# Patient Record
Sex: Male | Born: 1955 | Race: White | Hispanic: No | Marital: Married | Smoking: Former smoker
Health system: Southern US, Community
[De-identification: ages and names within clinical notes are randomized; demographics above are authoritative.]

## PROBLEM LIST (undated history)

## (undated) DIAGNOSIS — K8020 Calculus of gallbladder without cholecystitis without obstruction: Secondary | ICD-10-CM

## (undated) DIAGNOSIS — J45909 Unspecified asthma, uncomplicated: Secondary | ICD-10-CM

## (undated) DIAGNOSIS — K219 Gastro-esophageal reflux disease without esophagitis: Secondary | ICD-10-CM

## (undated) DIAGNOSIS — T8609 Other complications of bone marrow transplant: Secondary | ICD-10-CM

## (undated) DIAGNOSIS — D126 Benign neoplasm of colon, unspecified: Secondary | ICD-10-CM

## (undated) DIAGNOSIS — J449 Chronic obstructive pulmonary disease, unspecified: Secondary | ICD-10-CM

## (undated) DIAGNOSIS — Z86711 Personal history of pulmonary embolism: Secondary | ICD-10-CM

## (undated) DIAGNOSIS — K5790 Diverticulosis of intestine, part unspecified, without perforation or abscess without bleeding: Secondary | ICD-10-CM

## (undated) DIAGNOSIS — E119 Type 2 diabetes mellitus without complications: Secondary | ICD-10-CM

## (undated) DIAGNOSIS — F419 Anxiety disorder, unspecified: Secondary | ICD-10-CM

## (undated) DIAGNOSIS — F329 Major depressive disorder, single episode, unspecified: Secondary | ICD-10-CM

## (undated) DIAGNOSIS — D89813 Graft-versus-host disease, unspecified: Secondary | ICD-10-CM

## (undated) DIAGNOSIS — K5900 Constipation, unspecified: Secondary | ICD-10-CM

## (undated) DIAGNOSIS — Z9481 Bone marrow transplant status: Secondary | ICD-10-CM

## (undated) DIAGNOSIS — D649 Anemia, unspecified: Secondary | ICD-10-CM

## (undated) DIAGNOSIS — C9150 Adult T-cell lymphoma/leukemia (HTLV-1-associated) not having achieved remission: Secondary | ICD-10-CM

## (undated) DIAGNOSIS — F32A Depression, unspecified: Secondary | ICD-10-CM

## (undated) DIAGNOSIS — M1990 Unspecified osteoarthritis, unspecified site: Secondary | ICD-10-CM

## (undated) HISTORY — DX: Anxiety disorder, unspecified: F41.9

## (undated) HISTORY — DX: Anemia, unspecified: D64.9

## (undated) HISTORY — DX: Other complications of bone marrow transplant: T86.09

## (undated) HISTORY — DX: Major depressive disorder, single episode, unspecified: F32.9

## (undated) HISTORY — PX: CHOLECYSTECTOMY: SHX55

## (undated) HISTORY — PX: EXPLORATORY LAPAROTOMY: SUR591

## (undated) HISTORY — DX: Benign neoplasm of colon, unspecified: D12.6

## (undated) HISTORY — DX: Gastro-esophageal reflux disease without esophagitis: K21.9

## (undated) HISTORY — DX: Unspecified osteoarthritis, unspecified site: M19.90

## (undated) HISTORY — DX: Graft-versus-host disease, unspecified: D89.813

## (undated) HISTORY — DX: Calculus of gallbladder without cholecystitis without obstruction: K80.20

## (undated) HISTORY — PX: LUNG SURGERY: SHX703

## (undated) HISTORY — DX: Depression, unspecified: F32.A

## (undated) HISTORY — PX: COLONOSCOPY W/ BIOPSIES: SHX1374

## (undated) HISTORY — PX: HERNIA REPAIR: SHX51

## (undated) HISTORY — DX: Diverticulosis of intestine, part unspecified, without perforation or abscess without bleeding: K57.90

---

## 1999-04-13 ENCOUNTER — Encounter: Admission: RE | Admit: 1999-04-13 | Discharge: 1999-04-13 | Payer: Self-pay | Admitting: *Deleted

## 1999-04-13 ENCOUNTER — Encounter: Payer: Self-pay | Admitting: *Deleted

## 1999-10-25 ENCOUNTER — Ambulatory Visit (HOSPITAL_COMMUNITY): Admission: RE | Admit: 1999-10-25 | Discharge: 1999-10-25 | Payer: Self-pay | Admitting: Internal Medicine

## 1999-10-25 ENCOUNTER — Encounter: Payer: Self-pay | Admitting: Internal Medicine

## 2001-09-28 ENCOUNTER — Encounter: Admission: RE | Admit: 2001-09-28 | Discharge: 2001-09-28 | Payer: Self-pay | Admitting: Internal Medicine

## 2001-09-28 ENCOUNTER — Encounter: Payer: Self-pay | Admitting: Internal Medicine

## 2002-04-16 ENCOUNTER — Encounter: Admission: RE | Admit: 2002-04-16 | Discharge: 2002-04-16 | Payer: Self-pay | Admitting: Internal Medicine

## 2002-04-16 ENCOUNTER — Encounter: Payer: Self-pay | Admitting: Internal Medicine

## 2002-10-12 ENCOUNTER — Emergency Department (HOSPITAL_COMMUNITY): Admission: EM | Admit: 2002-10-12 | Discharge: 2002-10-12 | Payer: Self-pay | Admitting: Emergency Medicine

## 2002-10-12 ENCOUNTER — Encounter: Payer: Self-pay | Admitting: Emergency Medicine

## 2002-12-30 ENCOUNTER — Encounter: Admission: RE | Admit: 2002-12-30 | Discharge: 2002-12-30 | Payer: Self-pay | Admitting: Internal Medicine

## 2002-12-30 ENCOUNTER — Encounter: Payer: Self-pay | Admitting: Internal Medicine

## 2003-01-03 ENCOUNTER — Ambulatory Visit (HOSPITAL_COMMUNITY): Admission: RE | Admit: 2003-01-03 | Discharge: 2003-01-03 | Payer: Self-pay | Admitting: Dermatology

## 2004-02-02 ENCOUNTER — Ambulatory Visit (HOSPITAL_COMMUNITY): Admission: RE | Admit: 2004-02-02 | Discharge: 2004-02-02 | Payer: Self-pay | Admitting: Physician Assistant

## 2004-07-23 ENCOUNTER — Emergency Department (HOSPITAL_COMMUNITY): Admission: EM | Admit: 2004-07-23 | Discharge: 2004-07-23 | Payer: Self-pay | Admitting: Emergency Medicine

## 2004-07-25 ENCOUNTER — Emergency Department (HOSPITAL_COMMUNITY): Admission: EM | Admit: 2004-07-25 | Discharge: 2004-07-25 | Payer: Self-pay | Admitting: Family Medicine

## 2004-10-05 ENCOUNTER — Ambulatory Visit (HOSPITAL_COMMUNITY): Admission: RE | Admit: 2004-10-05 | Discharge: 2004-10-05 | Payer: Self-pay | Admitting: Urology

## 2005-06-27 ENCOUNTER — Emergency Department (HOSPITAL_COMMUNITY): Admission: EM | Admit: 2005-06-27 | Discharge: 2005-06-28 | Payer: Self-pay | Admitting: Emergency Medicine

## 2005-06-28 ENCOUNTER — Inpatient Hospital Stay (HOSPITAL_COMMUNITY): Admission: EM | Admit: 2005-06-28 | Discharge: 2005-07-02 | Payer: Self-pay | Admitting: Emergency Medicine

## 2005-11-15 ENCOUNTER — Emergency Department (HOSPITAL_COMMUNITY): Admission: EM | Admit: 2005-11-15 | Discharge: 2005-11-15 | Payer: Self-pay | Admitting: Emergency Medicine

## 2006-07-01 ENCOUNTER — Emergency Department (HOSPITAL_COMMUNITY): Admission: EM | Admit: 2006-07-01 | Discharge: 2006-07-01 | Payer: Self-pay | Admitting: Emergency Medicine

## 2006-12-28 ENCOUNTER — Emergency Department (HOSPITAL_COMMUNITY): Admission: EM | Admit: 2006-12-28 | Discharge: 2006-12-28 | Payer: Self-pay | Admitting: Emergency Medicine

## 2007-03-03 ENCOUNTER — Inpatient Hospital Stay (HOSPITAL_COMMUNITY): Admission: RE | Admit: 2007-03-03 | Discharge: 2007-03-04 | Payer: Self-pay | Admitting: Surgery

## 2008-01-10 ENCOUNTER — Ambulatory Visit: Payer: Self-pay | Admitting: Internal Medicine

## 2008-01-22 ENCOUNTER — Ambulatory Visit: Payer: Self-pay | Admitting: Internal Medicine

## 2008-01-22 ENCOUNTER — Encounter: Payer: Self-pay | Admitting: Internal Medicine

## 2008-01-28 ENCOUNTER — Encounter: Payer: Self-pay | Admitting: Internal Medicine

## 2008-03-12 ENCOUNTER — Encounter: Admission: RE | Admit: 2008-03-12 | Discharge: 2008-05-07 | Payer: Self-pay | Admitting: Orthopedic Surgery

## 2009-03-07 HISTORY — PX: BONE MARROW TRANSPLANT: SHX200

## 2009-04-20 ENCOUNTER — Ambulatory Visit (HOSPITAL_COMMUNITY): Admission: RE | Admit: 2009-04-20 | Discharge: 2009-04-20 | Payer: Self-pay | Admitting: Radiation Oncology

## 2009-07-10 ENCOUNTER — Ambulatory Visit (HOSPITAL_COMMUNITY): Payer: Self-pay | Admitting: Family Medicine

## 2009-07-10 ENCOUNTER — Encounter (HOSPITAL_COMMUNITY): Admission: RE | Admit: 2009-07-10 | Discharge: 2009-08-09 | Payer: Self-pay | Admitting: Physician Assistant

## 2009-07-13 ENCOUNTER — Ambulatory Visit (HOSPITAL_COMMUNITY): Payer: Self-pay | Admitting: Family Medicine

## 2009-07-13 ENCOUNTER — Ambulatory Visit: Payer: Self-pay | Admitting: Oncology

## 2009-07-16 ENCOUNTER — Inpatient Hospital Stay (HOSPITAL_COMMUNITY): Admission: EM | Admit: 2009-07-16 | Discharge: 2009-07-20 | Payer: Self-pay | Admitting: Emergency Medicine

## 2009-07-17 ENCOUNTER — Encounter (INDEPENDENT_AMBULATORY_CARE_PROVIDER_SITE_OTHER): Payer: Self-pay | Admitting: Internal Medicine

## 2010-05-24 LAB — CBC
HCT: 23.4 % — ABNORMAL LOW (ref 39.0–52.0)
Hemoglobin: 8.1 g/dL — ABNORMAL LOW (ref 13.0–17.0)
MCHC: 34.2 g/dL (ref 30.0–36.0)
MCHC: 34.6 g/dL (ref 30.0–36.0)
MCV: 96.4 fL (ref 78.0–100.0)
Platelets: 19 10*3/uL — CL (ref 150–400)
RBC: 2.41 MIL/uL — ABNORMAL LOW (ref 4.22–5.81)
RBC: 2.54 MIL/uL — ABNORMAL LOW (ref 4.22–5.81)
RDW: 20 % — ABNORMAL HIGH (ref 11.5–15.5)
WBC: 3.6 10*3/uL — ABNORMAL LOW (ref 4.0–10.5)
WBC: 4.2 10*3/uL (ref 4.0–10.5)

## 2010-05-24 LAB — DIFFERENTIAL
Basophils Absolute: 0 10*3/uL (ref 0.0–0.1)
Eosinophils Absolute: 0 10*3/uL (ref 0.0–0.7)
Eosinophils Relative: 1 % (ref 0–5)
Eosinophils Relative: 2 % (ref 0–5)
Lymphs Abs: 2 10*3/uL (ref 0.7–4.0)
Lymphs Abs: 2.3 10*3/uL (ref 0.7–4.0)
Monocytes Absolute: 0.1 10*3/uL (ref 0.1–1.0)
Monocytes Relative: 4 % (ref 3–12)
Neutrophils Relative %: 38 % — ABNORMAL LOW (ref 43–77)

## 2010-05-25 LAB — URINALYSIS, ROUTINE W REFLEX MICROSCOPIC
Bilirubin Urine: NEGATIVE
Glucose, UA: NEGATIVE mg/dL
Glucose, UA: NEGATIVE mg/dL
Ketones, ur: NEGATIVE mg/dL
Nitrite: NEGATIVE
Protein, ur: 30 mg/dL — AB
Specific Gravity, Urine: 1.018 (ref 1.005–1.030)

## 2010-05-25 LAB — DIFFERENTIAL
Basophils Relative: 1 % (ref 0–1)
Basophils Relative: 1 % (ref 0–1)
Eosinophils Relative: 0 % (ref 0–5)
Eosinophils Relative: 1 % (ref 0–5)
Lymphocytes Relative: 51 % — ABNORMAL HIGH (ref 12–46)
Lymphs Abs: 2.1 10*3/uL (ref 0.7–4.0)
Neutrophils Relative %: 42 % — ABNORMAL LOW (ref 43–77)
nRBC: 3 /100 WBC — ABNORMAL HIGH

## 2010-05-25 LAB — COMPREHENSIVE METABOLIC PANEL
ALT: 22 U/L (ref 0–53)
AST: 22 U/L (ref 0–37)
Albumin: 2.8 g/dL — ABNORMAL LOW (ref 3.5–5.2)
Albumin: 3.7 g/dL (ref 3.5–5.2)
Alkaline Phosphatase: 63 U/L (ref 39–117)
BUN: 11 mg/dL (ref 6–23)
BUN: 14 mg/dL (ref 6–23)
CO2: 29 mEq/L (ref 19–32)
CO2: 30 mEq/L (ref 19–32)
Calcium: 9 mg/dL (ref 8.4–10.5)
Chloride: 102 mEq/L (ref 96–112)
Chloride: 105 mEq/L (ref 96–112)
Creatinine, Ser: 0.74 mg/dL (ref 0.4–1.5)
Creatinine, Ser: 0.94 mg/dL (ref 0.4–1.5)
Creatinine, Ser: 1.07 mg/dL (ref 0.4–1.5)
GFR calc Af Amer: >60 mL/min (ref >60)
GFR calc non Af Amer: >60 mL/min (ref >60)
Glucose, Bld: 154 mg/dL — ABNORMAL HIGH (ref 70–99)
Glucose, Bld: 98 mg/dL (ref 70–99)
Potassium: 4 mEq/L (ref 3.5–5.1)
Sodium: 141 mEq/L (ref 135–145)
Total Bilirubin: 1 mg/dL (ref 0.3–1.2)
Total Protein: 5.6 g/dL — ABNORMAL LOW (ref 6.0–8.3)
Total Protein: 6 g/dL (ref 6.0–8.3)
Total Protein: 6.5 g/dL (ref 6.0–8.3)

## 2010-05-25 LAB — CROSSMATCH: ABO/RH(D): A POS

## 2010-05-25 LAB — DIC (DISSEMINATED INTRAVASCULAR COAGULATION)PANEL
D-Dimer, Quant: 1.43 ug/mL-FEU — ABNORMAL HIGH (ref 0.00–0.48)
Fibrinogen: 661 mg/dL — ABNORMAL HIGH (ref 204–475)
Fibrinogen: 715 mg/dL — ABNORMAL HIGH (ref 204–475)
INR: 1.15 (ref 0.00–1.49)
INR: 1.24 (ref 0.00–1.49)
Platelets: 20 10*3/uL — CL (ref 150–400)
Platelets: 31 10*3/uL — ABNORMAL LOW (ref 150–400)
Prothrombin Time: 15.5 seconds — ABNORMAL HIGH (ref 11.6–15.2)
Smear Review: NONE SEEN
aPTT: 28 seconds (ref 24–37)

## 2010-05-25 LAB — FERRITIN: Ferritin: 548 ng/mL — ABNORMAL HIGH (ref 22–322)

## 2010-05-25 LAB — CBC
HCT: 25.1 % — ABNORMAL LOW (ref 39.0–52.0)
HCT: 27.7 % — ABNORMAL LOW (ref 39.0–52.0)
HCT: 28.6 % — ABNORMAL LOW (ref 39.0–52.0)
Hemoglobin: 9.6 g/dL — ABNORMAL LOW (ref 13.0–17.0)
Hemoglobin: 9.9 g/dL — ABNORMAL LOW (ref 13.0–17.0)
MCHC: 34.5 g/dL (ref 30.0–36.0)
MCV: 96.9 fL (ref 78.0–100.0)
MCV: 97.2 fL (ref 78.0–100.0)
MCV: 97.5 fL (ref 78.0–100.0)
Platelets: 19 10*3/uL — CL (ref 150–400)
RBC: 2.84 MIL/uL — ABNORMAL LOW (ref 4.22–5.81)
RBC: 2.94 MIL/uL — ABNORMAL LOW (ref 4.22–5.81)
RDW: 20.6 % — ABNORMAL HIGH (ref 11.5–15.5)
RDW: 20.6 % — ABNORMAL HIGH (ref 11.5–15.5)
RDW: 21.1 % — ABNORMAL HIGH (ref 11.5–15.5)
WBC: 3.8 10*3/uL — ABNORMAL LOW (ref 4.0–10.5)

## 2010-05-25 LAB — POCT CARDIAC MARKERS
CKMB, poc: <1 ng/mL — ABNORMAL LOW (ref 1.0–8.0)
Myoglobin, poc: 70.5 ng/mL (ref 12–200)

## 2010-05-25 LAB — APTT: aPTT: 33 seconds (ref 24–37)

## 2010-05-25 LAB — LIPASE, BLOOD: Lipase: 21 U/L (ref 11–59)

## 2010-05-25 LAB — HEPARIN INDUCED THROMBOCYTOPENIA PNL
UFH Low Dose 0.1 IU/mL: 0 % Release
UFH Low Dose 0.5 IU/mL: 0 % Release

## 2010-05-25 LAB — URINE MICROSCOPIC-ADD ON

## 2010-05-25 LAB — ABO/RH
ABO/RH(D): A POS
ABO/RH(D): A POS

## 2010-05-25 LAB — LIPID PANEL
Cholesterol: 146 mg/dL (ref 0–200)
LDL Cholesterol: 86 mg/dL (ref 0–99)
Total CHOL/HDL Ratio: 4.3 RATIO

## 2010-05-25 LAB — CULTURE, BLOOD (ROUTINE X 2)

## 2010-05-25 LAB — HEMOGLOBIN AND HEMATOCRIT, BLOOD
HCT: 23 % — ABNORMAL LOW (ref 39.0–52.0)
Hemoglobin: 8.1 g/dL — ABNORMAL LOW (ref 13.0–17.0)

## 2010-05-25 LAB — BONE MARROW EXAM

## 2010-05-25 LAB — FOLATE: Folate: 14 ng/mL

## 2010-05-25 LAB — CARDIAC PANEL(CRET KIN+CKTOT+MB+TROPI)
CK, MB: 0.5 ng/mL (ref 0.3–4.0)
Relative Index: INVALID (ref 0.0–2.5)
Relative Index: INVALID (ref 0.0–2.5)

## 2010-05-25 LAB — IRON AND TIBC
TIBC: 247 ug/dL (ref 215–435)
UIBC: 205 ug/dL

## 2010-05-25 LAB — TYPE AND SCREEN: ABO/RH(D): A POS

## 2010-05-25 LAB — URINE CULTURE
Colony Count: NO GROWTH
Colony Count: NO GROWTH
Culture: NO GROWTH

## 2010-05-25 LAB — RETICULOCYTES
RBC.: 3.23 MIL/uL — ABNORMAL LOW (ref 4.22–5.81)
Retic Ct Pct: 2.7 % (ref 0.4–3.1)

## 2010-05-25 LAB — CK TOTAL AND CKMB (NOT AT ARMC)
CK, MB: 0.8 ng/mL (ref 0.3–4.0)
Total CK: 25 U/L (ref 7–232)

## 2010-05-25 LAB — CHROMOSOME ANALYSIS, BONE MARROW
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
## 2010-07-23 NOTE — Op Note (Signed)
NAME:  Derek Shepherd, Derek Shepherd                        ACCOUNT NO.:  [id number]   MEDICAL RECORD NO.:  [id number]                   PATIENT TYPE:  AMB   LOCATION:  DAY                                  FACILITY:  [facility]   PHYSICIAN:  Ronald L. Ovidio Hanger, M.D.           DATE OF BIRTH:  03/27/1955   DATE OF PROCEDURE:  01/03/2003  DATE OF DISCHARGE:                                 OPERATIVE REPORT   PREOPERATIVE DIAGNOSIS:  Left ureterolithiasis with significant pain.   POSTOPERATIVE DIAGNOSIS:  Left ureterolithiasis with significant pain.   OPERATION:  Cystourethroscopy, left ureteroscopy with holmium laser  lithotripsy, stone basket extraction and placement of double J stent.   SURGEON:  Lucrezia Starch. Earlene Plater, M.D.   ANESTHESIA:  General endotracheal.   ESTIMATED BLOOD LOSS:  Negligible.   TUBES:  26 cm 6 [place] Cook double pigtail stent.   COMPLICATIONS:  None.   INDICATIONS FOR PROCEDURE:  [name] is a very nice 55 year old white male  who basically presented to our office with a two week history of left flank  pain which radiated to the groin, urgency and frequency. He underwent a CT  scan which revealed a 6 mm left ureteral distal stone with significant  hydronephrosis. He has had persistent pain and after understanding the  risks, benefits, and alternatives elected to proceed with the above  procedure.   DESCRIPTION OF PROCEDURE:  The patient was placed in the supine position,  after proper general endotracheal anesthesia was placed in the dorsal  lithotomy position, prepped and draped with Betadine in a sterile fashion.  Cystourethroscopy was performed with a 22.5 French Olympus panendoscope  utilizing the 12 and 70 degree lenses. The bladder was carefully inspected  and he had mild trilobar hypertrophy. The bladder was smooth walled, efflux  of urine was noted from the right ureteral orifice; however, the left  ureteral orifice appeared to be inflamed and not much urine was  effluxing  from it. Under fluoroscopic guidance, a 0.38 French Teflon coated guidewire  was placed and noted the wound within the left renal pelvis. The  panendoscope was removed and a short thin ACMI ureteroscope were used. The  ureteral orifice was negotiated and the stone which was quite large was  negotiated. Utilizing a 365 micron holmium laser fiber on a setting of 0.5  with a repetition rate of 5, the stone was fragmented into multiple  fragments visually and each fragment was serially grasped with a nitinol  basket and extracted and will be submitted for stone analysis. Reinspection  of the lower third ureter revealed there were no perforations, no further  stones or fragments. The ureteroscope was removed and under fluoroscopic  guidance, a 26 cm, 6 French double pigtail stent was placed and noted to be  in good position within the left renal pelvis within the bladder. A pullout  string was attached, bladder was drained and the string was taped to  the  penis and again confirmation of good stent location was performed  fluoroscopically. The bladder was drained, all scopes removed. The patient  was taken to the recovery room stable.                                               Ronald L. Ovidio Hanger, M.D.    RLD/MEDQ  D:  01/03/2003  T:  01/03/2003  Job:  [id number]

## 2010-07-23 NOTE — Discharge Summary (Signed)
NAMEHAMMAD, FINKLER              ACCOUNT NO.:  [id number]   MEDICAL RECORD NO.:  [id number]          PATIENT TYPE:  INP   LOCATION:  1518                         FACILITY:  [facility]   PHYSICIAN:  Almedia Balls. Ranell Patrick, M.D. DATE OF BIRTH:  1955-05-17   DATE OF ADMISSION:  06/28/2005  DATE OF DISCHARGE:  07/02/2005                                 DISCHARGE SUMMARY   ADMISSION DIAGNOSIS:  Left knee cellulitis.   DISCHARGE DIAGNOSIS:  Left knee cellulitis, improved.   BRIEF HISTORY:  The patient is a 55 year old male who presents to our office  complaining about left knee pain, cellulitis, and increased redness.  The  patient  was urgently admitted for IV antibiotics and lab work.  The patient  denied any fall or injury, just stated pain and swelling just started.   HOSPITAL COURSE:  The patient was admitted from our office with left knee  cellulitis.  The patient had no other previous medical problems. He was  running a temperature of 101.5 upon admission. All other vital signs were  within normal limits.  We did start him on vancomycin IV.  __________ was  with the Jones dressing.  The patient's initial blood work showed a white  count of only 10.5, and that did decrease over his hospital stay.  All other  lab work was  within normal limits.   Hospital days 1 and 2, he stated his knee was still sore, but the erythema  had improved dramatically.  Plans were to send him home on IV vancomycin  once this was approved through [facility].  After he had 48 hours of  IV antibiotics in the hospital, we did perform a PICC line placement, and he  was discharged home with 2 weeks of IV vancomycin planned.  The patient  tolerated hospital stay well and otherwise did well.   DISCHARGE ACTIVITIES:  The patient will be discharged home on July 01, 2005, with a PICC to his right arm.   DISCHARGE MEDICATIONS:  1.  Vancomycin 1 g twice daily for 2 weeks.  2.  Vicodin 5 mg p.o. q. 4-6 h p.r.n.  pain.   CONDITION ON DISCHARGE:  Stable.   DIET:  Regular.   ACTIVITY:  Limited weightbearing on that left leg.   FOLLOW UP:  The patient will follow back up with Dr. Malon Kindle in 2  weeks.      Thomas B. Dixon, P.A.    ______________________________  Almedia Balls. Ranell Patrick, M.D.    TBD/MEDQ  D:  07/15/2005  T:  07/15/2005  Job:  [id number]

## 2010-12-10 LAB — DIFFERENTIAL
Basophils Absolute: 0
Basophils Relative: 0
Eosinophils Absolute: 0.1
Neutrophils Relative %: 67

## 2010-12-10 LAB — CBC
MCHC: 34.4
MCV: 89.3
Platelets: 243

## 2010-12-10 LAB — BASIC METABOLIC PANEL
BUN: 7
CO2: 31
Chloride: 105
Creatinine, Ser: 0.99

## 2010-12-14 DIAGNOSIS — M5137 Other intervertebral disc degeneration, lumbosacral region: Secondary | ICD-10-CM | POA: Insufficient documentation

## 2010-12-15 LAB — CBC
HCT: 42
Hemoglobin: 14.4
MCHC: 34.4
MCV: 89.1
Platelets: 226
RBC: 4.72
RDW: 12.9
WBC: 11.1 — ABNORMAL HIGH

## 2010-12-15 LAB — URINALYSIS, ROUTINE W REFLEX MICROSCOPIC
Protein, ur: NEGATIVE
Urobilinogen, UA: 1

## 2010-12-15 LAB — DIFFERENTIAL
Basophils Absolute: 0.1
Basophils Relative: 1
Eosinophils Absolute: 0.1
Eosinophils Relative: 1
Lymphocytes Relative: 11 — ABNORMAL LOW
Lymphs Abs: 1.2
Monocytes Absolute: 0.7
Monocytes Relative: 6
Neutro Abs: 9 — ABNORMAL HIGH
Neutrophils Relative %: 81 — ABNORMAL HIGH

## 2010-12-15 LAB — BASIC METABOLIC PANEL WITH GFR
BUN: 11
Chloride: 104
GFR calc non Af Amer: >60
Glucose, Bld: 101 — ABNORMAL HIGH
Potassium: 4.6
Sodium: 141

## 2010-12-15 LAB — BASIC METABOLIC PANEL
CO2: 30
Calcium: 9.2
Creatinine, Ser: 0.86
GFR calc Af Amer: >60

## 2011-03-31 ENCOUNTER — Encounter (HOSPITAL_COMMUNITY): Payer: Self-pay

## 2011-03-31 ENCOUNTER — Encounter (HOSPITAL_COMMUNITY)
Admission: RE | Admit: 2011-03-31 | Discharge: 2011-03-31 | Disposition: A | Discharge status: Home / Self Care | Payer: BC Managed Care – PPO | Source: Ambulatory Visit | Attending: Family Medicine | Admitting: Family Medicine

## 2011-03-31 DIAGNOSIS — Z5189 Encounter for other specified aftercare: Secondary | ICD-10-CM | POA: Insufficient documentation

## 2011-03-31 DIAGNOSIS — J4489 Other specified chronic obstructive pulmonary disease: Secondary | ICD-10-CM | POA: Insufficient documentation

## 2011-03-31 DIAGNOSIS — J449 Chronic obstructive pulmonary disease, unspecified: Secondary | ICD-10-CM | POA: Insufficient documentation

## 2011-04-04 ENCOUNTER — Encounter (HOSPITAL_COMMUNITY): Payer: BC Managed Care – PPO

## 2011-04-06 ENCOUNTER — Encounter (HOSPITAL_COMMUNITY)
Admission: RE | Admit: 2011-04-06 | Discharge: 2011-04-06 | Disposition: A | Discharge status: Home / Self Care | Payer: BC Managed Care – PPO | Source: Ambulatory Visit | Attending: *Deleted | Admitting: *Deleted

## 2011-04-11 ENCOUNTER — Encounter (HOSPITAL_COMMUNITY)
Admission: RE | Admit: 2011-04-11 | Discharge: 2011-04-11 | Disposition: A | Discharge status: Home / Self Care | Payer: BC Managed Care – PPO | Source: Ambulatory Visit | Attending: Family Medicine | Admitting: Family Medicine

## 2011-04-11 DIAGNOSIS — Z5189 Encounter for other specified aftercare: Secondary | ICD-10-CM | POA: Insufficient documentation

## 2011-04-11 DIAGNOSIS — J4489 Other specified chronic obstructive pulmonary disease: Secondary | ICD-10-CM | POA: Insufficient documentation

## 2011-04-11 DIAGNOSIS — J449 Chronic obstructive pulmonary disease, unspecified: Secondary | ICD-10-CM | POA: Insufficient documentation

## 2011-04-13 ENCOUNTER — Encounter (HOSPITAL_COMMUNITY)
Admission: RE | Admit: 2011-04-13 | Discharge: 2011-04-13 | Disposition: A | Discharge status: Home / Self Care | Payer: BC Managed Care – PPO | Source: Ambulatory Visit | Attending: *Deleted | Admitting: *Deleted

## 2011-04-18 ENCOUNTER — Encounter (HOSPITAL_COMMUNITY)
Admission: RE | Admit: 2011-04-18 | Discharge: 2011-04-18 | Disposition: A | Discharge status: Home / Self Care | Payer: BC Managed Care – PPO | Source: Ambulatory Visit | Attending: *Deleted | Admitting: *Deleted

## 2011-04-20 ENCOUNTER — Encounter (HOSPITAL_COMMUNITY)
Admission: RE | Admit: 2011-04-20 | Discharge: 2011-04-20 | Disposition: A | Discharge status: Home / Self Care | Payer: BC Managed Care – PPO | Source: Ambulatory Visit | Attending: *Deleted | Admitting: *Deleted

## 2011-04-25 ENCOUNTER — Encounter (HOSPITAL_COMMUNITY)
Admission: RE | Admit: 2011-04-25 | Discharge: 2011-04-25 | Disposition: A | Discharge status: Home / Self Care | Payer: BC Managed Care – PPO | Source: Ambulatory Visit | Attending: *Deleted | Admitting: *Deleted

## 2011-04-27 ENCOUNTER — Encounter (HOSPITAL_COMMUNITY)
Admission: RE | Admit: 2011-04-27 | Discharge: 2011-04-27 | Disposition: A | Discharge status: Home / Self Care | Payer: BC Managed Care – PPO | Source: Ambulatory Visit | Attending: Family Medicine | Admitting: Family Medicine

## 2011-05-02 ENCOUNTER — Encounter (HOSPITAL_COMMUNITY)
Admission: RE | Admit: 2011-05-02 | Discharge: 2011-05-02 | Disposition: A | Discharge status: Home / Self Care | Payer: BC Managed Care – PPO | Source: Ambulatory Visit | Attending: Family Medicine | Admitting: Family Medicine

## 2011-05-04 ENCOUNTER — Encounter (HOSPITAL_COMMUNITY)
Admission: RE | Admit: 2011-05-04 | Discharge: 2011-05-04 | Disposition: A | Discharge status: Home / Self Care | Payer: BC Managed Care – PPO | Source: Ambulatory Visit | Attending: Family Medicine | Admitting: Family Medicine

## 2011-05-09 ENCOUNTER — Encounter (HOSPITAL_COMMUNITY)
Admission: RE | Admit: 2011-05-09 | Discharge: 2011-05-09 | Disposition: A | Discharge status: Home / Self Care | Payer: BC Managed Care – PPO | Source: Ambulatory Visit | Attending: Family Medicine | Admitting: Family Medicine

## 2011-05-09 DIAGNOSIS — J4489 Other specified chronic obstructive pulmonary disease: Secondary | ICD-10-CM | POA: Insufficient documentation

## 2011-05-09 DIAGNOSIS — Z5189 Encounter for other specified aftercare: Secondary | ICD-10-CM | POA: Insufficient documentation

## 2011-05-09 DIAGNOSIS — J449 Chronic obstructive pulmonary disease, unspecified: Secondary | ICD-10-CM | POA: Insufficient documentation

## 2011-05-11 ENCOUNTER — Encounter (HOSPITAL_COMMUNITY)
Admission: RE | Admit: 2011-05-11 | Discharge: 2011-05-11 | Disposition: A | Discharge status: Home / Self Care | Payer: BC Managed Care – PPO | Source: Ambulatory Visit | Attending: Family Medicine | Admitting: Family Medicine

## 2011-05-16 ENCOUNTER — Encounter (HOSPITAL_COMMUNITY)
Admission: RE | Admit: 2011-05-16 | Discharge: 2011-05-16 | Disposition: A | Discharge status: Home / Self Care | Payer: BC Managed Care – PPO | Source: Ambulatory Visit | Attending: Family Medicine | Admitting: Family Medicine

## 2011-05-18 ENCOUNTER — Encounter (HOSPITAL_COMMUNITY)
Admission: RE | Admit: 2011-05-18 | Discharge: 2011-05-18 | Disposition: A | Discharge status: Home / Self Care | Payer: BC Managed Care – PPO | Source: Ambulatory Visit | Attending: Family Medicine | Admitting: Family Medicine

## 2011-05-23 ENCOUNTER — Encounter (HOSPITAL_COMMUNITY)
Admission: RE | Admit: 2011-05-23 | Discharge: 2011-05-23 | Disposition: A | Discharge status: Home / Self Care | Payer: BC Managed Care – PPO | Source: Ambulatory Visit | Attending: Family Medicine | Admitting: Family Medicine

## 2011-05-25 ENCOUNTER — Encounter (HOSPITAL_COMMUNITY)
Admission: RE | Admit: 2011-05-25 | Discharge: 2011-05-25 | Disposition: A | Discharge status: Home / Self Care | Payer: BC Managed Care – PPO | Source: Ambulatory Visit | Attending: Family Medicine | Admitting: Family Medicine

## 2011-05-30 ENCOUNTER — Encounter (HOSPITAL_COMMUNITY)
Admission: RE | Admit: 2011-05-30 | Discharge: 2011-05-30 | Disposition: A | Discharge status: Home / Self Care | Payer: BC Managed Care – PPO | Source: Ambulatory Visit | Attending: Family Medicine | Admitting: Family Medicine

## 2011-06-01 ENCOUNTER — Encounter (HOSPITAL_COMMUNITY)
Admission: RE | Admit: 2011-06-01 | Discharge: 2011-06-01 | Disposition: A | Discharge status: Home / Self Care | Payer: BC Managed Care – PPO | Source: Ambulatory Visit | Attending: Family Medicine | Admitting: Family Medicine

## 2011-06-06 ENCOUNTER — Encounter (HOSPITAL_COMMUNITY)
Admission: RE | Admit: 2011-06-06 | Discharge: 2011-06-06 | Disposition: A | Discharge status: Home / Self Care | Payer: BC Managed Care – PPO | Source: Ambulatory Visit | Attending: Family Medicine | Admitting: Family Medicine

## 2011-06-06 DIAGNOSIS — J4489 Other specified chronic obstructive pulmonary disease: Secondary | ICD-10-CM | POA: Insufficient documentation

## 2011-06-06 DIAGNOSIS — J449 Chronic obstructive pulmonary disease, unspecified: Secondary | ICD-10-CM | POA: Insufficient documentation

## 2011-06-06 DIAGNOSIS — Z5189 Encounter for other specified aftercare: Secondary | ICD-10-CM | POA: Insufficient documentation

## 2011-06-08 ENCOUNTER — Encounter (HOSPITAL_COMMUNITY)
Admission: RE | Admit: 2011-06-08 | Discharge: 2011-06-08 | Disposition: A | Discharge status: Home / Self Care | Payer: BC Managed Care – PPO | Source: Ambulatory Visit | Attending: Family Medicine | Admitting: Family Medicine

## 2011-06-13 ENCOUNTER — Encounter (HOSPITAL_COMMUNITY)
Admission: RE | Admit: 2011-06-13 | Discharge: 2011-06-13 | Disposition: A | Discharge status: Home / Self Care | Payer: BC Managed Care – PPO | Source: Ambulatory Visit | Attending: Family Medicine | Admitting: Family Medicine

## 2011-06-15 ENCOUNTER — Encounter (HOSPITAL_COMMUNITY)
Admission: RE | Admit: 2011-06-15 | Discharge: 2011-06-15 | Disposition: A | Discharge status: Home / Self Care | Payer: BC Managed Care – PPO | Source: Ambulatory Visit | Attending: Family Medicine | Admitting: Family Medicine

## 2011-06-20 ENCOUNTER — Encounter (HOSPITAL_COMMUNITY)
Admission: RE | Admit: 2011-06-20 | Discharge: 2011-06-20 | Disposition: A | Discharge status: Home / Self Care | Payer: BC Managed Care – PPO | Source: Ambulatory Visit | Attending: Family Medicine | Admitting: Family Medicine

## 2011-06-22 ENCOUNTER — Encounter (HOSPITAL_COMMUNITY)
Admission: RE | Admit: 2011-06-22 | Discharge: 2011-06-22 | Disposition: A | Discharge status: Home / Self Care | Payer: BC Managed Care – PPO | Source: Ambulatory Visit | Attending: Family Medicine | Admitting: Family Medicine

## 2011-06-27 ENCOUNTER — Encounter (HOSPITAL_COMMUNITY)
Admission: RE | Admit: 2011-06-27 | Discharge: 2011-06-27 | Disposition: A | Discharge status: Home / Self Care | Payer: BC Managed Care – PPO | Source: Ambulatory Visit | Attending: Family Medicine | Admitting: Family Medicine

## 2011-06-29 ENCOUNTER — Encounter (HOSPITAL_COMMUNITY)
Admission: RE | Admit: 2011-06-29 | Discharge: 2011-06-29 | Disposition: A | Discharge status: Home / Self Care | Payer: BC Managed Care – PPO | Source: Ambulatory Visit | Attending: Family Medicine | Admitting: Family Medicine

## 2011-07-04 ENCOUNTER — Encounter (HOSPITAL_COMMUNITY)
Admission: RE | Admit: 2011-07-04 | Discharge: 2011-07-04 | Disposition: A | Discharge status: Home / Self Care | Payer: BC Managed Care – PPO | Source: Ambulatory Visit | Attending: Family Medicine | Admitting: Family Medicine

## 2011-07-06 ENCOUNTER — Encounter (HOSPITAL_COMMUNITY): Payer: BC Managed Care – PPO

## 2011-07-11 ENCOUNTER — Encounter (HOSPITAL_COMMUNITY): Payer: BC Managed Care – PPO

## 2011-07-13 ENCOUNTER — Encounter (HOSPITAL_COMMUNITY): Payer: BC Managed Care – PPO

## 2011-07-22 NOTE — Progress Notes (Signed)
Pulmonary Rehabilitation Program Outcomes Report   Orientation:  03/31/2011 Graduate Date:  tbd Discharge Date:  tbd # of sessions completed: 12 DX: COPD  Pulmonologist: Gayland Curry Family MD:  Rudi Heap Class Time:  13:00  A.  Exercise Program:  Tolerates exercise @ 2.5 METS for 15 minutes  B.  Mental Health:  Good mental attitude  C.  Education/Instruction/Skills  Knows THR for exercise and Uses Perceived Exertion Scale and/or Dyspnea Scale  Uses Perceived Exertion Scale and/or Dyspnea Scale  D.  Nutrition/Weight Control/Body Composition:  Adherence to prescribed nutrition program: good    E.  Blood Lipids    Lab Results  Component Value Date   CHOL  Value: 146        ATP III CLASSIFICATION:  <200     mg/dL   Desirable  782-956  mg/dL   Borderline High  >=213    mg/dL   High        0/86/5784   HDL 34* 07/17/2009   LDLCALC  Value: 86        Total Cholesterol/HDL:CHD Risk Coronary Heart Disease Risk Table                     Men   Women  1/2 Average Risk   3.4   3.3  Average Risk       5.0   4.4  2 X Average Risk   9.6   7.1  3 X Average Risk  23.4   11.0        Use the calculated Patient Ratio above and the CHD Risk Table to determine the patient's CHD Risk.        ATP III CLASSIFICATION (LDL):  <100     mg/dL   Optimal  696-295  mg/dL   Near or Above                    Optimal  130-159  mg/dL   Borderline  284-132  mg/dL   High  >440     mg/dL   Very High 03/09/7251   TRIG 128 07/17/2009   CHOLHDL 4.3 07/17/2009    F.  Lifestyle Changes:  Making positive lifestyle changes  G.  Symptoms noted with exercise:  Asymptomatic  Report Completed By:  Lelon Huh. Teshia Mahone RN   Comments:  This is patients Half way Report. He achieved a peak Mets of 2.5. His Resting HR is 86 and his resting BP is 130/80. His peak HR is 104 and his peak BP is 132/80. A graduation report will follow.

## 2011-07-22 NOTE — Progress Notes (Signed)
Pulmonary Rehabilitation Program Outcomes Report   Orientation:  03/31/2011 Graduate Date:  tbd Discharge Date:  tbd # of sessions completed: 3 DX: COPD  Pulmonologist: Gayland Curry Family MD:  Rudi Heap Class Time:  13:00  A.  Exercise Program:  Tolerates exercise @ 2.2 METS for 15 minutes  B.  Mental Health:  Good mental attitude  C.  Education/Instruction/Skills  Knows THR for exercise and Uses Perceived Exertion Scale and/or Dyspnea Scale  Uses Perceived Exertion Scale and/or Dyspnea Scale  D.  Nutrition/Weight Control/Body Composition:  Adherence to prescribed nutrition program: good    E.  Blood Lipids    Lab Results  Component Value Date   CHOL  Value: 146        ATP III CLASSIFICATION:  <200     mg/dL   Desirable  409-811  mg/dL   Borderline High  >=914    mg/dL   High        7/82/9562   HDL 34* 07/17/2009   LDLCALC  Value: 86        Total Cholesterol/HDL:CHD Risk Coronary Heart Disease Risk Table                     Men   Women  1/2 Average Risk   3.4   3.3  Average Risk       5.0   4.4  2 X Average Risk   9.6   7.1  3 X Average Risk  23.4   11.0        Use the calculated Patient Ratio above and the CHD Risk Table to determine the patient's CHD Risk.        ATP III CLASSIFICATION (LDL):  <100     mg/dL   Optimal  130-865  mg/dL   Near or Above                    Optimal  130-159  mg/dL   Borderline  784-696  mg/dL   High  >295     mg/dL   Very High 2/84/1324   TRIG 128 07/17/2009   CHOLHDL 4.3 07/17/2009    F.  Lifestyle Changes:  Making positive lifestyle changes  G.  Symptoms noted with exercise:  Asymptomatic  Report Completed By:  Lelon Huh. Shyann Hefner RN   Comments:  This is patients 1 st week report. He achieved a peak Mets of 2.2. His resting HR is 74 and his resting BP is 110/72. His peak HR is 102 and his peak BP is 140/60. A halfway report will follow on his 12 th visit

## 2011-07-22 NOTE — Progress Notes (Signed)
Pulmonary Rehabilitation Program Outcomes Report   Orientation:  03/31/2011 Graduate Date:  04.29/2013 Discharge Date:  07/04/2011 # of sessions completed: 26 DX: COPD  Pulmonologist: Gayland Curry Family MD:  Rudi Heap  Class Time:  13:00  A.  Exercise Program:  Tolerates exercise @ 2.9 METS for 15 minutes  B.  Mental Health:  Good mental attitude  C.  Education/Instruction/Skills  Knows THR for exercise and Uses Perceived Exertion Scale and/or Dyspnea Scale  Attended all education classes  D.  Nutrition/Weight Control/Body Composition:  Adherence to prescribed nutrition program: good    E.  Blood Lipids    Lab Results  Component Value Date   CHOL  Value: 146        ATP III CLASSIFICATION:  <200     mg/dL   Desirable  409-811  mg/dL   Borderline High  >=914    mg/dL   High        7/82/9562   HDL 34* 07/17/2009   LDLCALC  Value: 86        Total Cholesterol/HDL:CHD Risk Coronary Heart Disease Risk Table                     Men   Women  1/2 Average Risk   3.4   3.3  Average Risk       5.0   4.4  2 X Average Risk   9.6   7.1  3 X Average Risk  23.4   11.0        Use the calculated Patient Ratio above and the CHD Risk Table to determine the patient's CHD Risk.        ATP III CLASSIFICATION (LDL):  <100     mg/dL   Optimal  130-865  mg/dL   Near or Above                    Optimal  130-159  mg/dL   Borderline  784-696  mg/dL   High  >295     mg/dL   Very High 2/84/1324   TRIG 128 07/17/2009   CHOLHDL 4.3 07/17/2009    F.  Lifestyle Changes:  Making positive lifestyle changes  G.  Symptoms noted with exercise:  Asymptomatic  Report Completed By:  Lelon Huh. Tyshawn Keel RN   Comments:  Mr Shostak has progressed nicely to 30 min of aerobic exercise @ Max MET level of 2.9 and 10 min strength and flexibility exercises. Patient has met with dietician. All patients Vitals are WNL. DC instructions were given and discussed in detail; Verbalized understanding.  Patient plans to join a local gym for follow up exercise. Cardiac Rehab staff will make F/U calls at 1 month,  6 months and 1 year. Patient had no C/O any abnormal S/S while in rehab.

## 2011-10-31 ENCOUNTER — Emergency Department (HOSPITAL_BASED_OUTPATIENT_CLINIC_OR_DEPARTMENT_OTHER)
Admission: EM | Admit: 2011-10-31 | Discharge: 2011-10-31 | Disposition: A | Discharge status: Home / Self Care | Payer: BC Managed Care – PPO | Attending: Emergency Medicine | Admitting: Emergency Medicine

## 2011-10-31 ENCOUNTER — Encounter (HOSPITAL_BASED_OUTPATIENT_CLINIC_OR_DEPARTMENT_OTHER): Payer: Self-pay | Admitting: Emergency Medicine

## 2011-10-31 DIAGNOSIS — D6832 Hemorrhagic disorder due to extrinsic circulating anticoagulants: Secondary | ICD-10-CM

## 2011-10-31 DIAGNOSIS — Z7901 Long term (current) use of anticoagulants: Secondary | ICD-10-CM | POA: Insufficient documentation

## 2011-10-31 DIAGNOSIS — Z86718 Personal history of other venous thrombosis and embolism: Secondary | ICD-10-CM | POA: Insufficient documentation

## 2011-10-31 DIAGNOSIS — C9100 Acute lymphoblastic leukemia not having achieved remission: Secondary | ICD-10-CM | POA: Insufficient documentation

## 2011-10-31 DIAGNOSIS — R040 Epistaxis: Secondary | ICD-10-CM | POA: Insufficient documentation

## 2011-10-31 DIAGNOSIS — J4489 Other specified chronic obstructive pulmonary disease: Secondary | ICD-10-CM | POA: Insufficient documentation

## 2011-10-31 DIAGNOSIS — J449 Chronic obstructive pulmonary disease, unspecified: Secondary | ICD-10-CM | POA: Insufficient documentation

## 2011-10-31 HISTORY — DX: Chronic obstructive pulmonary disease, unspecified: J44.9

## 2011-10-31 HISTORY — DX: Adult T-cell lymphoma/leukemia (HTLV-1-associated) not having achieved remission: C91.50

## 2011-10-31 LAB — CBC WITH DIFFERENTIAL/PLATELET
Hemoglobin: 10.4 g/dL — ABNORMAL LOW (ref 13.0–17.0)
Lymphocytes Relative: 24 % (ref 12–46)
Lymphs Abs: 1.2 10*3/uL (ref 0.7–4.0)
MCH: 35.9 pg — ABNORMAL HIGH (ref 26.0–34.0)
Monocytes Relative: 8 % (ref 3–12)
Neutro Abs: 2.8 10*3/uL (ref 1.7–7.7)
Neutrophils Relative %: 60 % (ref 43–77)
RBC: 2.9 MIL/uL — ABNORMAL LOW (ref 4.22–5.81)
WBC: 4.7 10*3/uL (ref 4.0–10.5)

## 2011-10-31 LAB — PROTIME-INR: INR: 6.27 (ref 0.00–1.49)

## 2011-10-31 MED ORDER — PHYTONADIONE 5 MG PO TABS
10.0000 mg | ORAL_TABLET | Freq: Once | ORAL | Status: AC
Start: 2011-10-31 — End: 2011-10-31
  Administered 2011-10-31: 10 mg via ORAL

## 2011-10-31 MED ORDER — OXYMETAZOLINE HCL 0.05 % NA SOLN
NASAL | Status: AC
  Administered 2011-10-31: 21:00:00
  Filled 2011-10-31: qty 15

## 2011-10-31 MED ORDER — PHYTONADIONE 5 MG PO TABS
ORAL_TABLET | ORAL | Status: AC
  Administered 2011-10-31: 10 mg via ORAL
  Filled 2011-10-31: qty 2

## 2011-10-31 NOTE — ED Notes (Signed)
Pt is on coumain for DVT s/p cholecystectomy.

## 2011-10-31 NOTE — ED Notes (Signed)
Pt c/o epistaxis from right nare since 2pm today. Pt states bleeding will stop and then he pulls a clot out and bleeding continues.

## 2011-10-31 NOTE — ED Notes (Signed)
No nose bleed at present
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
## 2011-10-31 NOTE — ED Notes (Signed)
EDP Bernette Mayers notified of PT, INR

## 2011-12-29 ENCOUNTER — Encounter (HOSPITAL_COMMUNITY)
Admission: RE | Admit: 2011-12-29 | Discharge: 2011-12-29 | Disposition: A | Discharge status: Home / Self Care | Payer: BC Managed Care – PPO | Source: Ambulatory Visit | Attending: Family Medicine | Admitting: Family Medicine

## 2011-12-29 ENCOUNTER — Encounter (HOSPITAL_COMMUNITY): Payer: Self-pay

## 2011-12-29 VITALS — BP 120/70 | HR 84 | Ht 67.0 in | Wt 237.4 lb

## 2011-12-29 DIAGNOSIS — J4489 Other specified chronic obstructive pulmonary disease: Secondary | ICD-10-CM | POA: Insufficient documentation

## 2011-12-29 DIAGNOSIS — Z5189 Encounter for other specified aftercare: Secondary | ICD-10-CM | POA: Insufficient documentation

## 2011-12-29 DIAGNOSIS — J9610 Chronic respiratory failure, unspecified whether with hypoxia or hypercapnia: Secondary | ICD-10-CM

## 2011-12-29 DIAGNOSIS — J449 Chronic obstructive pulmonary disease, unspecified: Secondary | ICD-10-CM | POA: Insufficient documentation

## 2011-12-29 NOTE — Progress Notes (Addendum)
Dr. Quincy Sheehan referred patient to Pulmonary Rehab due to COPD 496. During orientation advised patient on arrival and appointment times what to wear, what to do before, during and after exercise. Reviewed attendance and class policy. Talked about inclement weather and class consultation policy. Pt is scheduled to start Pulmonary Rehab on 01/02/12 at 1 pm. Pt was advised to come to class 5 minutes before class starts. He was also given instructions on meeting with the dietician and attending the Family Structure classes. Pt is eager to get started.   Patient did a 6 minute walk test on 12/29/11.

## 2011-12-29 NOTE — Patient Instructions (Signed)
Pt has finished orientation and is scheduled to start PR on 01/02/12 at 1 pm. Pt has been instructed to arrive to class 15 minutes early for scheduled class. Pt has been instructed to wear comfortable clothing and shoes with rubber soles. Pt has been told to take their medications 1 hour prior to coming to class.  If the patient is not going to attend class, he/she has been instructed to call.

## 2012-01-02 ENCOUNTER — Encounter (HOSPITAL_COMMUNITY)
Admission: RE | Admit: 2012-01-02 | Discharge: 2012-01-02 | Disposition: A | Discharge status: Home / Self Care | Payer: BC Managed Care – PPO | Source: Ambulatory Visit | Attending: *Deleted | Admitting: *Deleted

## 2012-01-04 ENCOUNTER — Encounter (HOSPITAL_COMMUNITY)
Admission: RE | Admit: 2012-01-04 | Discharge: 2012-01-04 | Disposition: A | Discharge status: Home / Self Care | Payer: BC Managed Care – PPO | Source: Ambulatory Visit | Attending: *Deleted | Admitting: *Deleted

## 2012-01-09 ENCOUNTER — Encounter (HOSPITAL_COMMUNITY): Payer: BC Managed Care – PPO

## 2012-01-11 ENCOUNTER — Encounter (HOSPITAL_COMMUNITY)
Admission: RE | Admit: 2012-01-11 | Discharge: 2012-01-11 | Disposition: A | Discharge status: Home / Self Care | Payer: BC Managed Care – PPO | Source: Ambulatory Visit | Attending: Family Medicine | Admitting: Family Medicine

## 2012-01-11 DIAGNOSIS — J4489 Other specified chronic obstructive pulmonary disease: Secondary | ICD-10-CM | POA: Insufficient documentation

## 2012-01-11 DIAGNOSIS — J449 Chronic obstructive pulmonary disease, unspecified: Secondary | ICD-10-CM | POA: Insufficient documentation

## 2012-01-11 DIAGNOSIS — Z5189 Encounter for other specified aftercare: Secondary | ICD-10-CM | POA: Insufficient documentation

## 2012-01-16 ENCOUNTER — Encounter (HOSPITAL_COMMUNITY)
Admission: RE | Admit: 2012-01-16 | Discharge: 2012-01-16 | Disposition: A | Discharge status: Home / Self Care | Payer: BC Managed Care – PPO | Source: Ambulatory Visit | Attending: *Deleted | Admitting: *Deleted

## 2012-01-18 ENCOUNTER — Encounter (HOSPITAL_COMMUNITY)
Admission: RE | Admit: 2012-01-18 | Discharge: 2012-01-18 | Disposition: A | Discharge status: Home / Self Care | Payer: BC Managed Care – PPO | Source: Ambulatory Visit | Attending: *Deleted | Admitting: *Deleted

## 2012-01-23 ENCOUNTER — Encounter (HOSPITAL_COMMUNITY)
Admission: RE | Admit: 2012-01-23 | Discharge: 2012-01-23 | Disposition: A | Discharge status: Home / Self Care | Payer: BC Managed Care – PPO | Source: Ambulatory Visit | Attending: *Deleted | Admitting: *Deleted

## 2012-01-25 ENCOUNTER — Encounter (HOSPITAL_COMMUNITY)
Admission: RE | Admit: 2012-01-25 | Discharge: 2012-01-25 | Disposition: A | Discharge status: Home / Self Care | Payer: BC Managed Care – PPO | Source: Ambulatory Visit | Attending: *Deleted | Admitting: *Deleted

## 2012-01-30 ENCOUNTER — Encounter (HOSPITAL_COMMUNITY)
Admission: RE | Admit: 2012-01-30 | Discharge: 2012-01-30 | Disposition: A | Discharge status: Home / Self Care | Payer: BC Managed Care – PPO | Source: Ambulatory Visit | Attending: *Deleted | Admitting: *Deleted

## 2012-02-01 ENCOUNTER — Encounter (HOSPITAL_COMMUNITY): Payer: BC Managed Care – PPO

## 2012-02-06 ENCOUNTER — Encounter (HOSPITAL_COMMUNITY)
Admission: RE | Admit: 2012-02-06 | Discharge: 2012-02-06 | Disposition: A | Discharge status: Home / Self Care | Payer: BC Managed Care – PPO | Source: Ambulatory Visit | Attending: Family Medicine | Admitting: Family Medicine

## 2012-02-06 DIAGNOSIS — J4489 Other specified chronic obstructive pulmonary disease: Secondary | ICD-10-CM | POA: Insufficient documentation

## 2012-02-06 DIAGNOSIS — J449 Chronic obstructive pulmonary disease, unspecified: Secondary | ICD-10-CM | POA: Insufficient documentation

## 2012-02-06 DIAGNOSIS — Z5189 Encounter for other specified aftercare: Secondary | ICD-10-CM | POA: Insufficient documentation

## 2012-02-08 ENCOUNTER — Encounter (HOSPITAL_COMMUNITY): Payer: BC Managed Care – PPO

## 2012-02-13 ENCOUNTER — Encounter (HOSPITAL_COMMUNITY)
Admission: RE | Admit: 2012-02-13 | Discharge: 2012-02-13 | Disposition: A | Discharge status: Home / Self Care | Payer: BC Managed Care – PPO | Source: Ambulatory Visit | Attending: *Deleted | Admitting: *Deleted

## 2012-02-15 ENCOUNTER — Encounter (HOSPITAL_COMMUNITY)
Admission: RE | Admit: 2012-02-15 | Discharge: 2012-02-15 | Disposition: A | Discharge status: Home / Self Care | Payer: BC Managed Care – PPO | Source: Ambulatory Visit | Attending: *Deleted | Admitting: *Deleted

## 2012-02-20 ENCOUNTER — Encounter (HOSPITAL_COMMUNITY)
Admission: RE | Admit: 2012-02-20 | Discharge: 2012-02-20 | Disposition: A | Discharge status: Home / Self Care | Payer: BC Managed Care – PPO | Source: Ambulatory Visit | Attending: *Deleted | Admitting: *Deleted

## 2012-02-22 ENCOUNTER — Encounter (HOSPITAL_COMMUNITY)
Admission: RE | Admit: 2012-02-22 | Discharge: 2012-02-22 | Disposition: A | Discharge status: Home / Self Care | Payer: BC Managed Care – PPO | Source: Ambulatory Visit | Attending: *Deleted | Admitting: *Deleted

## 2012-02-27 ENCOUNTER — Encounter (HOSPITAL_COMMUNITY)
Admission: RE | Admit: 2012-02-27 | Discharge: 2012-02-27 | Disposition: A | Discharge status: Home / Self Care | Payer: BC Managed Care – PPO | Source: Ambulatory Visit | Attending: *Deleted | Admitting: *Deleted

## 2012-03-05 ENCOUNTER — Encounter (HOSPITAL_COMMUNITY)
Admission: RE | Admit: 2012-03-05 | Discharge: 2012-03-05 | Disposition: A | Discharge status: Home / Self Care | Payer: BC Managed Care – PPO | Source: Ambulatory Visit | Attending: *Deleted | Admitting: *Deleted

## 2012-03-07 ENCOUNTER — Encounter (HOSPITAL_COMMUNITY): Payer: BC Managed Care – PPO

## 2012-03-12 ENCOUNTER — Encounter (HOSPITAL_COMMUNITY)
Admission: RE | Admit: 2012-03-12 | Discharge: 2012-03-12 | Disposition: A | Discharge status: Home / Self Care | Payer: BC Managed Care – PPO | Source: Ambulatory Visit | Attending: Family Medicine | Admitting: Family Medicine

## 2012-03-12 DIAGNOSIS — J449 Chronic obstructive pulmonary disease, unspecified: Secondary | ICD-10-CM | POA: Insufficient documentation

## 2012-03-12 DIAGNOSIS — J4489 Other specified chronic obstructive pulmonary disease: Secondary | ICD-10-CM | POA: Insufficient documentation

## 2012-03-12 DIAGNOSIS — Z5189 Encounter for other specified aftercare: Secondary | ICD-10-CM | POA: Insufficient documentation

## 2012-03-14 ENCOUNTER — Encounter (HOSPITAL_COMMUNITY): Payer: BC Managed Care – PPO

## 2012-03-19 ENCOUNTER — Encounter (HOSPITAL_COMMUNITY)
Admission: RE | Admit: 2012-03-19 | Discharge: 2012-03-19 | Disposition: A | Discharge status: Home / Self Care | Payer: BC Managed Care – PPO | Source: Ambulatory Visit | Attending: *Deleted | Admitting: *Deleted

## 2012-03-21 ENCOUNTER — Encounter (HOSPITAL_COMMUNITY): Payer: BC Managed Care – PPO

## 2012-03-26 ENCOUNTER — Encounter (HOSPITAL_COMMUNITY): Payer: BC Managed Care – PPO

## 2012-03-28 ENCOUNTER — Encounter (HOSPITAL_COMMUNITY): Payer: BC Managed Care – PPO

## 2012-04-02 ENCOUNTER — Encounter (HOSPITAL_COMMUNITY): Payer: BC Managed Care – PPO

## 2012-04-04 ENCOUNTER — Encounter (HOSPITAL_COMMUNITY): Payer: BC Managed Care – PPO

## 2012-04-04 NOTE — Progress Notes (Signed)
Pulmonary Rehabilitation Program Outcomes Report   Orientation:  12/29/2011 Halfway Report: 02/20/2012 Graduate Date:  tbd Discharge Date:  Tbd # of sessions completed: 12  Pulmonologist: Lucita Lora Family MD:  Rudi Heap Class Time:  13:00  A.  Exercise Program:  Tolerates exercise @  4.02 METS for 15 minutes  B.  Mental Health:  Good mental attitude  C.  Education/Instruction/Skills  Accurately checks own pulse.  Rest:  72  Exercise:  107, Knows THR for exercise and Uses Perceived Exertion Scale and/or Dyspnea Scale  Demonstrates accurate pursed lip breathing  D.  Nutrition/Weight Control/Body Composition:  Adherence to prescribed nutrition program: good    E.  Blood Lipids    Lab Results  Component Value Date   CHOL  Value: 146        ATP III CLASSIFICATION:  <200     mg/dL   Desirable  478-295  mg/dL   Borderline High  >=621    mg/dL   High        05/12/6576   HDL 34* 07/17/2009   LDLCALC  Value: 86        Total Cholesterol/HDL:CHD Risk Coronary Heart Disease Risk Table                     Men   Women  1/2 Average Risk   3.4   3.3  Average Risk       5.0   4.4  2 X Average Risk   9.6   7.1  3 X Average Risk  23.4   11.0        Use the calculated Patient Ratio above and the CHD Risk Table to determine the patient's CHD Risk.        ATP III CLASSIFICATION (LDL):  <100     mg/dL   Optimal  469-629  mg/dL   Near or Above                    Optimal  130-159  mg/dL   Borderline  528-413  mg/dL   High  >244     mg/dL   Very High 0/12/2723   TRIG 128 07/17/2009   CHOLHDL 4.3 07/17/2009    F.  Lifestyle Changes:  Making positive lifestyle changes  G.  Symptoms noted with exercise:  Asymptomatic  Report Completed By:  Lelon Huh. Summer Mccolgan RN   Comments:  This is patients halfway report. HE achieved a peak METS of 4.02. His resting HR is 72 and resting BP is 130/70, His peak HR was 107 and peak BP was 160/80. A graduation report will follow upon his 24th  Visit.

## 2012-04-04 NOTE — Progress Notes (Signed)
Pulmonary Rehabilitation Program Outcomes Report   Orientation:  12/29/2011 1st Visit Report:: 01/11/2012 Graduate Date:  tbd Discharge Date:  tbd # of sessions completed: 3 DX.: COPD  Cardiologist:  and Pulmonologist: Quotob Family MD:  Rudi Heap Class Time:  13:00  A.  Exercise Program:  Tolerates exercise @ 3.69  METS for 15 minutes and Walk Test Results:  Pre: Pre 6 minute Walk Test: Resting HR 71, BP 120/70, O2 93%, RPE 7, and RPD 9, 6 minute HR 102, BP 140/82, O2 91%, RPE 12 and RPD !13. Post HR 69, BP 120/78, O2 99%,  RPE  7, and RPD 7, . Walked 1,100 ft at 2.1 miles.  B.  Mental Health:  Good mental attitude  C.  Education/Instruction/Skills  Accurately checks own pulse.  Rest:  100  Exercise:  100, Knows THR for exercise and Uses Perceived Exertion Scale and/or Dyspnea Scale  Uses Perceived Exertion Scale and/or Dyspnea Scale  D.  Nutrition/Weight Control/Body Composition:  Adherence to prescribed nutrition program: good    E.  Blood Lipids    Lab Results  Component Value Date   CHOL  Value: 146        ATP III CLASSIFICATION:  <200     mg/dL   Desirable  119-147  mg/dL   Borderline High  >=829    mg/dL   High        5/62/1308   HDL 34* 07/17/2009   LDLCALC  Value: 86        Total Cholesterol/HDL:CHD Risk Coronary Heart Disease Risk Table                     Men   Women  1/2 Average Risk   3.4   3.3  Average Risk       5.0   4.4  2 X Average Risk   9.6   7.1  3 X Average Risk  23.4   11.0        Use the calculated Patient Ratio above and the CHD Risk Table to determine the patient's CHD Risk.        ATP III CLASSIFICATION (LDL):  <100     mg/dL   Optimal  657-846  mg/dL   Near or Above                    Optimal  130-159  mg/dL   Borderline  962-952  mg/dL   High  >841     mg/dL   Very High 05/28/4008   TRIG 128 07/17/2009   CHOLHDL 4.3 07/17/2009    F.  Lifestyle Changes:  Making positive lifestyle changes  G.  Symptoms noted with  exercise:  Asymptomatic  Report Completed By:  Lelon Huh. Ellysia Char RN   Comments:   This is patients 1st week Report. He achieved a peak METS of 3.69. His resting HR was 100 and resting BP was 110/62. His peak HR was 100 and peak BP was 140/60. A halfway report will follow on his 12 th visit.

## 2012-04-09 ENCOUNTER — Encounter (HOSPITAL_COMMUNITY): Payer: BC Managed Care – PPO

## 2012-04-11 ENCOUNTER — Encounter (HOSPITAL_COMMUNITY): Payer: BC Managed Care – PPO

## 2012-06-13 ENCOUNTER — Encounter: Payer: Self-pay | Admitting: Family Medicine

## 2013-01-28 ENCOUNTER — Encounter: Payer: Self-pay | Admitting: Internal Medicine

## 2013-02-04 ENCOUNTER — Encounter: Payer: Self-pay | Admitting: Internal Medicine

## 2013-02-15 ENCOUNTER — Ambulatory Visit (INDEPENDENT_AMBULATORY_CARE_PROVIDER_SITE_OTHER): Payer: Medicare Other | Admitting: Family Medicine

## 2013-02-15 ENCOUNTER — Encounter: Payer: Self-pay | Admitting: Family Medicine

## 2013-02-15 ENCOUNTER — Telehealth: Payer: Self-pay | Admitting: Family Medicine

## 2013-02-15 VITALS — BP 112/74 | HR 111 | Temp 100.6°F | Ht 66.5 in | Wt 228.0 lb

## 2013-02-15 DIAGNOSIS — Z0000 Encounter for general adult medical examination without abnormal findings: Secondary | ICD-10-CM

## 2013-02-15 DIAGNOSIS — R110 Nausea: Secondary | ICD-10-CM

## 2013-02-15 DIAGNOSIS — C9500 Acute leukemia of unspecified cell type not having achieved remission: Secondary | ICD-10-CM

## 2013-02-15 DIAGNOSIS — Z01419 Encounter for gynecological examination (general) (routine) without abnormal findings: Secondary | ICD-10-CM

## 2013-02-15 DIAGNOSIS — R5381 Other malaise: Secondary | ICD-10-CM

## 2013-02-15 LAB — POCT GLYCOSYLATED HEMOGLOBIN (HGB A1C): Hemoglobin A1C: 5.6

## 2013-02-15 MED ORDER — ONDANSETRON HCL 8 MG PO TABS: 8.0000 mg | ORAL_TABLET | Freq: Three times a day (TID) | ORAL | Status: DC | PRN

## 2013-02-15 NOTE — Progress Notes (Signed)
   Subjective:    Patient ID: EDMON MAGID, male    DOB: 08/11/1955, 57 y.o.   MRN: [id number]  HPI This 57 y.o. male presents for evaluation of CPE.  He has hx of Leukemia and Is receiving cheotherapy from Oncology and he is c/o fatigue, nausea, fever, and Not feeling well.  He has hx of COPD and wears nasal cannula oxygen.  He sees Pulmonary.   Review of Systems No chest pain, SOB, HA, dizziness, vision change, N/V, diarrhea, constipation, dysuria, urinary urgency or frequency, myalgias, arthralgias or rash.     Objective:   Physical Exam Vital signs noted  Chronically ill appearing male in NAD.  HEENT - Head atraumatic Normocephalic                Eyes - PERRLA, Conjuctiva - clear Sclera- Clear EOMI                Ears - EAC's Wnl TM's Wnl Gross Hearing WNL                Nose - Nasal cannula oxygen                Throat - oropharanx wnl Respiratory - Lungs CTA bilateral Cardiac - RRR S1 and S2 without murmur GI - Abdomen soft Nontender and bowel sounds active x 4 Extremities - No edema. Neuro - Grossly intact.       Assessment & Plan:  Encounter for routine gynecological examination - Plan: POCT glycosylated hemoglobin (Hb A1C), CMP14+EGFR, Lipid panel, PSA, total and free, ondansetron (ZOFRAN) 8 MG tablet  Leukemia - Need to follow up with Oncology.  Fever - Advised patient and wife to call Oncology and notify.  COPD - Follow up with Pulmonary.  Deatra Canter FNP

## 2013-02-16 ENCOUNTER — Other Ambulatory Visit: Payer: Self-pay | Admitting: Family Medicine

## 2013-02-16 LAB — CMP14+EGFR
ALT: 61 IU/L — ABNORMAL HIGH (ref 0–44)
AST: 64 IU/L — ABNORMAL HIGH (ref 0–40)
Albumin/Globulin Ratio: 1.8 (ref 1.1–2.5)
Albumin: 4.2 g/dL (ref 3.5–5.5)
Alkaline Phosphatase: 66 IU/L (ref 39–117)
BUN/Creatinine Ratio: 13 (ref 9–20)
BUN: 14 mg/dL (ref 6–24)
CO2: 37 mmol/L — ABNORMAL HIGH (ref 18–29)
Calcium: 9.7 mg/dL (ref 8.7–10.2)
Chloride: 92 mmol/L — ABNORMAL LOW (ref 97–108)
Creatinine, Ser: 1.09 mg/dL (ref 0.76–1.27)
GFR calc Af Amer: 88 mL/min/{1.73_m2} (ref >59)
GFR calc non Af Amer: 76 mL/min/{1.73_m2} (ref >59)
Globulin, Total: 2.4 g/dL (ref 1.5–4.5)
Glucose: 109 mg/dL — ABNORMAL HIGH (ref 65–99)
Potassium: 4.9 mmol/L (ref 3.5–5.2)
Sodium: 141 mmol/L (ref 134–144)
Total Bilirubin: <0.2 mg/dL (ref 0.0–1.2)
Total Protein: 6.6 g/dL (ref 6.0–8.5)

## 2013-02-16 LAB — PSA, TOTAL AND FREE
PSA, Free Pct: 9 %
PSA, Free: 0.09 ng/mL
PSA: 1 ng/mL (ref 0.0–4.0)

## 2013-02-16 LAB — LIPID PANEL
Chol/HDL Ratio: 3.5 ratio units (ref 0.0–5.0)
Cholesterol, Total: 271 mg/dL — ABNORMAL HIGH (ref 100–199)
HDL: 78 mg/dL (ref >39)
LDL Calculated: 136 mg/dL — ABNORMAL HIGH (ref 0–99)
Triglycerides: 284 mg/dL — ABNORMAL HIGH (ref 0–149)
VLDL Cholesterol Cal: 57 mg/dL — ABNORMAL HIGH (ref 5–40)

## 2013-02-16 MED ORDER — PROMETHAZINE HCL 25 MG PO TABS: 25.0000 mg | ORAL_TABLET | Freq: Three times a day (TID) | ORAL | Status: DC | PRN

## 2013-03-19 ENCOUNTER — Telehealth: Payer: Self-pay | Admitting: *Deleted

## 2013-03-19 ENCOUNTER — Ambulatory Visit: Payer: Medicare Other | Admitting: Family Medicine

## 2013-03-19 NOTE — Telephone Encounter (Signed)
appt rescheduled. Pt notified.

## 2013-03-20 ENCOUNTER — Encounter: Payer: Self-pay | Admitting: Family Medicine

## 2013-03-20 ENCOUNTER — Ambulatory Visit (INDEPENDENT_AMBULATORY_CARE_PROVIDER_SITE_OTHER): Payer: Medicare Other | Admitting: Family Medicine

## 2013-03-20 VITALS — BP 133/73 | HR 79 | Temp 99.1°F | Ht 66.5 in | Wt 237.0 lb

## 2013-03-20 DIAGNOSIS — C9101 Acute lymphoblastic leukemia, in remission: Secondary | ICD-10-CM | POA: Insufficient documentation

## 2013-03-20 DIAGNOSIS — I82409 Acute embolism and thrombosis of unspecified deep veins of unspecified lower extremity: Secondary | ICD-10-CM | POA: Insufficient documentation

## 2013-03-20 DIAGNOSIS — G894 Chronic pain syndrome: Secondary | ICD-10-CM

## 2013-03-20 DIAGNOSIS — M5137 Other intervertebral disc degeneration, lumbosacral region: Secondary | ICD-10-CM

## 2013-03-20 DIAGNOSIS — J449 Chronic obstructive pulmonary disease, unspecified: Secondary | ICD-10-CM

## 2013-03-20 DIAGNOSIS — E119 Type 2 diabetes mellitus without complications: Secondary | ICD-10-CM

## 2013-03-20 DIAGNOSIS — M5136 Other intervertebral disc degeneration, lumbar region: Secondary | ICD-10-CM

## 2013-03-20 NOTE — Patient Instructions (Signed)
Chronic Obstructive Pulmonary Disease  Chronic obstructive pulmonary disease (COPD) is a common lung condition in which airflow from the lungs is limited. COPD is a general term that can be used to describe many different lung problems that limit airflow, including both chronic bronchitis and emphysema.  If you have COPD, your lung function will probably never return to normal, but there are measures you can take to improve lung function and make yourself feel better.   CAUSES   · Smoking (common).    · Exposure to secondhand smoke.    · Genetic problems.  · Chronic inflammatory lung diseases or recurrent infections.  SYMPTOMS   · Shortness of breath, especially with physical activity.    · Deep, persistent (chronic) cough with a large amount of thick mucus.    · Wheezing.    · Rapid breaths (tachypnea).    · Gray or bluish discoloration (cyanosis) of the skin, especially in fingers, toes, or lips.    · Fatigue.    · Weight loss.    · Frequent infections or episodes when breathing symptoms become much worse (exacerbations).    · Chest tightness.  DIAGNOSIS   Your healthcare provider will take a medical history and perform a physical examination to make the initial diagnosis.  Additional tests for COPD may include:   · Lung (pulmonary) function tests.  · Chest X-ray.  · CT scan.  · Blood tests.  TREATMENT   Treatment available to help you feel better when you have COPD include:   · Inhaler and nebulizer medicines. These help manage the symptoms of COPD and make your breathing more comfortable  · Supplemental oxygen. Supplemental oxygen is only helpful if you have a low oxygen level in your blood.    · Exercise and physical activity. These are beneficial for nearly all people with COPD. Some people may also benefit from a pulmonary rehabilitation program.  HOME CARE INSTRUCTIONS   · Take all medicines (inhaled or pills) as directed by your health care provider.  · Only take over-the-counter or prescription medicines  for pain, fever, or discomfort as directed by your health care provider.    · Avoid over-the-counter medicines or cough syrups that dry up your airway (such as antihistamines) and slow down the elimination of secretions unless instructed otherwise by your healthcare provider.    · If you are a smoker, the most important thing that you can do is stop smoking. Continuing to smoke will cause further lung damage and breathing trouble. Ask your health care provider for help with quitting smoking. He or she can direct you to community resources or hospitals that provide support.  · Avoid exposure to irritants such as smoke, chemicals, and fumes that aggravate your breathing.  · Use oxygen therapy and pulmonary rehabilitation if directed by your health care provider. If you require home oxygen therapy, ask your healthcare provider whether you should purchase a pulse oximeter to measure your oxygen level at home.    · Avoid contact with individuals who have a contagious illness.  · Avoid extreme temperature and humidity changes.  · Eat healthy foods. Eating smaller, more frequent meals and resting before meals may help you maintain your strength.  · Stay active, but balance activity with periods of rest. Exercise and physical activity will help you maintain your ability to do things you want to do.  · Preventing infection and hospitalization is very important when you have COPD. Make sure to receive all the vaccines your health care provider recommends, especially the pneumococcal and influenza vaccines. Ask your healthcare provider whether you   need a pneumonia vaccine.  · Learn and use relaxation techniques to manage stress.  · Learn and use controlled breathing techniques as directed by your health care provider. Controlled breathing techniques include:    · Pursed lip breathing. Start by breathing in (inhaling) through your nose for 1 second. Then, purse your lips as if you were going to whistle and breathe out (exhale)  through the pursed lips for 2 seconds.    · Diaphragmatic breathing. Start by putting one hand on your abdomen just above your waist. Inhale slowly through your nose. The hand on your abdomen should move out. Then purse your lips and exhale slowly. You should be able to feel the hand on your abdomen moving in as you exhale.    · Learn and use controlled coughing to clear mucus from your lungs. Controlled coughing is a series of short, progressive coughs. The steps of controlled coughing are:    1. Lean your head slightly forward.    2. Breathe in deeply using diaphragmatic breathing.    3. Try to hold your breath for 3 seconds.    4. Keep your mouth slightly open while coughing twice.    5. Spit any mucus out into a tissue.    6. Rest and repeat the steps once or twice as needed.  SEEK MEDICAL CARE IF:   · You are coughing up more mucus than usual.    · There is a change in the color or thickness of your mucus.    · Your breathing is more labored than usual.    · Your breathing is faster than usual.    SEEK IMMEDIATE MEDICAL CARE IF:   · You have shortness of breath while you are resting.    · You have shortness of breath that prevents you from:  · Being able to talk.    · Performing your usual physical activities.    · You have chest pain lasting longer than 5 minutes.    · Your skin color is more cyanotic than usual.  · You measure low oxygen saturations for longer than 5 minutes with a pulse oximeter.  MAKE SURE YOU:   · Understand these instructions.  · Will watch your condition.  · Will get help right away if you are not doing well or get worse.  Document Released: 12/01/2004 Document Revised: 12/12/2012 Document Reviewed: 10/18/2012  [employer]® Patient Information ©2014 [employer].

## 2013-03-20 NOTE — Progress Notes (Signed)
   Subjective:    Patient ID: Derek Shepherd, male    DOB: [birth year], 58 y.o.   MRN: [id number]  HPI This 58 y.o. male presents for evaluation of follow up from hospital visit a few weeks ago. He has hx of copd and leukemia and he sees Dr. Melvyn Novas Heme/Onc at [facility].  He was seen a few Weeks ago for viral uri and he did not improve and developed SOB and had to be hospitalized by Dr. Melvyn Novas.  He has hypoxia and wears oxygen 2 liters [place]. He is feeling a lot better.   Review of Systems No chest pain, SOB, HA, dizziness, vision change, N/V, diarrhea, constipation, dysuria, urinary urgency or frequency, myalgias, arthralgias or rash.     Objective:   Physical Exam Vital signs noted  Well developed well nourished male wearing nasal cannula oxygen.  HEENT - Head atraumatic Normocephalic                Eyes - PERRLA, Conjuctiva - clear Sclera- Clear EOMI                Ears - EAC's Wnl TM's Wnl Gross Hearing WNL                Nose - Nares patent                 Throat - oropharanx wnl Respiratory - Lungs CTA bilateral with oxygen 2 liters [place]. Cardiac - RRR S1 and S2 without murmur GI - Abdomen soft Nontender and bowel sounds active x 4 Extremities - No edema. Neuro - Grossly intact.       Assessment & Plan:  ALL (acute lymphoid leukemia) in  - Follow up with Dr. Melvyn Novas Heme/Onc  COPD (chronic obstructive pulmonary disease)  - Stable at this time w/o exacerbation He states he has seen Pulmonary at [facility] in past but has not seen them recently.  DDD (degenerative disc disease), lumbar -Continue current pain management regimen.  Diabetes - Controlled and discussed hgbaic and meds  DVT (deep venous thrombosis)-Continue xarelto  Chronic pain syndrome - Continue current pain regimen  Lysbeth Penner FNP

## 2013-04-22 ENCOUNTER — Encounter: Payer: Self-pay | Admitting: Family Medicine

## 2013-04-22 ENCOUNTER — Ambulatory Visit (INDEPENDENT_AMBULATORY_CARE_PROVIDER_SITE_OTHER): Payer: Medicare Other | Admitting: Family Medicine

## 2013-04-22 VITALS — BP 115/71 | HR 67 | Temp 97.6°F | Ht 66.5 in | Wt 235.0 lb

## 2013-04-22 DIAGNOSIS — E119 Type 2 diabetes mellitus without complications: Secondary | ICD-10-CM

## 2013-04-22 LAB — POCT GLYCOSYLATED HEMOGLOBIN (HGB A1C): Hemoglobin A1C: 5.6

## 2013-04-22 NOTE — Progress Notes (Signed)
Subjective:    Patient ID: Derek Shepherd, male    DOB: 06-21-1955, 58 y.o.   MRN: [id number]  HPI This 58 y.o. male presents for evaluation of hypoglycemia. He has been having hyperglycemia and has been taking glipizide 17m po qd and it has been cut in half by his oncologist and now his fsbs are running 70-130's.  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   Review of Systems C/o hypoglycemia   No chest pain, SOB, HA, dizziness, vision change, N/V, diarrhea, constipation, dysuria, urinary urgency or frequency, myalgias, arthralgias or rash.  Objective:   Physical Exam  Vital signs noted  Well developed well nourished male.  HEENT - Head atraumatic Normocephalic                Eyes - PERRLA, Conjuctiva - clear Sclera- Clear EOMI                Ears - EAC's Wnl TM's Wnl Gross Hearing WNL                Nose - Nares patent                 Throat - oropharanx wnl Respiratory - Lungs CTA bilateral Cardiac - RRR S1 and S2 without murmur GI - Abdomen soft Nontender and bowel sounds active x 4 Extremities - No edema. Neuro - Grossly intact.  Results for orders placed in visit on 02/15/13  CMP14+EGFR      Result Value Ref Range   Glucose 109 (*) 65 - 99 mg/dL   BUN 14  6 - 24 mg/dL   Creatinine, Ser 1.09  0.76 - 1.27 mg/dL   GFR calc non Af Amer 76  >59 mL/min/1.73   GFR calc Af Amer 88  >59 mL/min/1.73   BUN/Creatinine Ratio 13  9 - 20   Sodium 141  134 - 144 mmol/L   Potassium 4.9  3.5 - 5.2 mmol/L   Chloride 92 (*) 97 - 108 mmol/L   CO2 37 (*) 18 - 29 mmol/L   Calcium 9.7  8.7 - 10.2 mg/dL   Total Protein 6.6  6.0 - 8.5 g/dL   Albumin 4.2  3.5 - 5.5 g/dL   Globulin, Total 2.4  1.5 - 4.5 g/dL   Albumin/Globulin Ratio 1.8  1.1 - 2.5   Total Bilirubin <0.2  0.0 - 1.2 mg/dL   Alkaline Phosphatase 66  39 - 117 IU/L   AST 64 (*) 0 - 40 IU/L   ALT 61 (*) 0 - 44 IU/L  LIPID PANEL      Result Value Ref Range   Cholesterol, Total 271 (*) 100 -  199 mg/dL   Triglycerides 284 (*) 0 - 149 mg/dL   HDL 78  >39 mg/dL   VLDL Cholesterol Cal 57 (*) 5 - 40 mg/dL   LDL Calculated 136 (*) 0 - 99 mg/dL   Chol/HDL Ratio 3.5  0.0 - 5.0 ratio units  PSA, TOTAL AND FREE      Result Value Ref Range   PSA 1.0  0.0 - 4.0 ng/mL   PSA, Free 0.09  N/A ng/mL   PSA, Free Pct 9.0    POCT GLYCOSYLATED HEMOGLOBIN (HGB A1C)      Result Value Ref Range   Hemoglobin A1C 5.6        Assessment & Plan:  Diabetes - DC glipizide and take only if fasting fsbs is  greater than 120.  Glucometer test strips For fsbs fasting.  Lysbeth Penner FNP

## 2013-05-01 ENCOUNTER — Emergency Department (HOSPITAL_BASED_OUTPATIENT_CLINIC_OR_DEPARTMENT_OTHER): Payer: Medicare Other

## 2013-05-01 ENCOUNTER — Encounter (HOSPITAL_BASED_OUTPATIENT_CLINIC_OR_DEPARTMENT_OTHER): Payer: Self-pay | Admitting: Emergency Medicine

## 2013-05-01 ENCOUNTER — Emergency Department (HOSPITAL_BASED_OUTPATIENT_CLINIC_OR_DEPARTMENT_OTHER)
Admission: EM | Admit: 2013-05-01 | Discharge: 2013-05-01 | Disposition: A | Discharge status: Home / Self Care | Payer: Medicare Other | Attending: Emergency Medicine | Admitting: Emergency Medicine

## 2013-05-01 DIAGNOSIS — R079 Chest pain, unspecified: Secondary | ICD-10-CM | POA: Insufficient documentation

## 2013-05-01 DIAGNOSIS — Y9389 Activity, other specified: Secondary | ICD-10-CM | POA: Insufficient documentation

## 2013-05-01 DIAGNOSIS — Z7901 Long term (current) use of anticoagulants: Secondary | ICD-10-CM | POA: Insufficient documentation

## 2013-05-01 DIAGNOSIS — Z86718 Personal history of other venous thrombosis and embolism: Secondary | ICD-10-CM | POA: Insufficient documentation

## 2013-05-01 DIAGNOSIS — Z8719 Personal history of other diseases of the digestive system: Secondary | ICD-10-CM | POA: Insufficient documentation

## 2013-05-01 DIAGNOSIS — J449 Chronic obstructive pulmonary disease, unspecified: Secondary | ICD-10-CM | POA: Insufficient documentation

## 2013-05-01 DIAGNOSIS — Z87891 Personal history of nicotine dependence: Secondary | ICD-10-CM | POA: Insufficient documentation

## 2013-05-01 DIAGNOSIS — Z856 Personal history of leukemia: Secondary | ICD-10-CM | POA: Insufficient documentation

## 2013-05-01 DIAGNOSIS — Z79899 Other long term (current) drug therapy: Secondary | ICD-10-CM | POA: Insufficient documentation

## 2013-05-01 DIAGNOSIS — W19XXXA Unspecified fall, initial encounter: Secondary | ICD-10-CM

## 2013-05-01 DIAGNOSIS — J4489 Other specified chronic obstructive pulmonary disease: Secondary | ICD-10-CM | POA: Insufficient documentation

## 2013-05-01 DIAGNOSIS — IMO0002 Reserved for concepts with insufficient information to code with codable children: Secondary | ICD-10-CM | POA: Insufficient documentation

## 2013-05-01 DIAGNOSIS — W1809XA Striking against other object with subsequent fall, initial encounter: Secondary | ICD-10-CM | POA: Insufficient documentation

## 2013-05-01 DIAGNOSIS — S0990XA Unspecified injury of head, initial encounter: Secondary | ICD-10-CM | POA: Insufficient documentation

## 2013-05-01 DIAGNOSIS — T07XXXA Unspecified multiple injuries, initial encounter: Secondary | ICD-10-CM | POA: Insufficient documentation

## 2013-05-01 DIAGNOSIS — Y929 Unspecified place or not applicable: Secondary | ICD-10-CM | POA: Insufficient documentation

## 2013-05-01 DIAGNOSIS — Z792 Long term (current) use of antibiotics: Secondary | ICD-10-CM | POA: Insufficient documentation

## 2013-05-01 LAB — PROTIME-INR
INR: 1.26 (ref 0.00–1.49)
Prothrombin Time: 15.5 seconds — ABNORMAL HIGH (ref 11.6–15.2)

## 2013-05-01 LAB — CBC
HCT: 37.1 % — ABNORMAL LOW (ref 39.0–52.0)
Hemoglobin: 11.7 g/dL — ABNORMAL LOW (ref 13.0–17.0)
MCH: 35.9 pg — ABNORMAL HIGH (ref 26.0–34.0)
MCHC: 31.5 g/dL (ref 30.0–36.0)
MCV: 113.8 fL — ABNORMAL HIGH (ref 78.0–100.0)
Platelets: 117 10*3/uL — ABNORMAL LOW (ref 150–400)
RBC: 3.26 MIL/uL — ABNORMAL LOW (ref 4.22–5.81)
RDW: 13.3 % (ref 11.5–15.5)
WBC: 5.9 10*3/uL (ref 4.0–10.5)

## 2013-05-01 LAB — BASIC METABOLIC PANEL
BUN: 18 mg/dL (ref 6–23)
CO2: 38 mEq/L — ABNORMAL HIGH (ref 19–32)
CREATININE: 1 mg/dL (ref 0.50–1.35)
Calcium: 10 mg/dL (ref 8.4–10.5)
Chloride: 97 mEq/L (ref 96–112)
GFR calc Af Amer: >90 mL/min (ref >90)
GFR calc non Af Amer: 83 mL/min — ABNORMAL LOW (ref >90)
Glucose, Bld: 125 mg/dL — ABNORMAL HIGH (ref 70–99)
Potassium: 4.3 mEq/L (ref 3.7–5.3)
Sodium: 142 mEq/L (ref 137–147)

## 2013-05-01 LAB — TROPONIN I: Troponin I: <0.3 ng/mL (ref <0.30)

## 2013-05-01 NOTE — ED Notes (Signed)
[place] on [address]  Denies loc,  Now having dizziness when bending over,

## 2013-05-01 NOTE — ED Notes (Signed)
Fell on ice 04/26/13-hit head-no LOC-c/o dizziness when leans over since the fall-ambulated to triage w/o difficulty

## 2013-05-01 NOTE — Discharge Instructions (Signed)

## 2013-05-01 NOTE — ED Notes (Signed)
Patient transported to CT 

## 2013-05-01 NOTE — ED Provider Notes (Signed)
CSN: [id number]     Arrival date & time 05/01/13  1913 History   First MD Initiated Contact with Patient 05/01/13 1928     Chief Complaint  Patient presents with  . Fall     (Consider location/radiation/quality/duration/timing/severity/associated sxs/prior Treatment) HPI Comments: Pt states that he fell and hit the back of his head on the ice 5 days ago and ever since then he has been having episodes of dizziness. State that he cam in today because it isn't going away. Pt states that he has intermittent sharp chest pain in different areas of his chest. Pt states that that is a new problem. Pt states that he is on xarelto and his plt are still not normal from being treated for leukemia. No fever cough, numbness or weakness. States that his diabetes medications have been cut back  The history is provided by the patient. No language interpreter was used.    Past Medical History  Diagnosis Date  . Leukemia-lymphoma, T-cell, acute, HTLV-I-associated   . COPD (chronic obstructive pulmonary disease)   . DVT (deep venous thrombosis)   . Personal history of colonic  adenoma 01/22/2008   Past Surgical History  Procedure Laterality Date  . Cholecystectomy    . Lung surgery    . Exploratory laparotomy    . Hernia repair     No family history on file. History  Substance Use Topics  . Smoking status: Former Research scientist (life sciences)  . Smokeless tobacco: Not on file  . Alcohol Use: No    Review of Systems  Constitutional: Negative.   Respiratory: Negative.   Cardiovascular: Negative.       Allergies  Nsaids  Home Medications   Current Outpatient Rx  Name  Route  Sig  Dispense  Refill  . albuterol (PROVENTIL HFA;VENTOLIN HFA) 108 (90 BASE) MCG/ACT inhaler   Inhalation   Inhale 2 puffs into the lungs every 6 (six) hours as needed for wheezing or shortness of breath.         Marland Kitchen albuterol (PROVENTIL) (2.5 MG/3ML) 0.083% nebulizer solution   Nebulization   Take 2.5 mg by nebulization every 6  (six) hours as needed for wheezing or shortness of breath.         . ALPRAZolam (XANAX) 0.25 MG tablet      at bedtime as needed.          Marland Kitchen azaTHIOprine (IMURAN) 50 MG tablet   Oral   Take 50 mg by mouth daily.          . citalopram (CELEXA) 20 MG tablet   Oral   Take 20 mg by mouth daily.          . fentaNYL (DURAGESIC - DOSED MCG/HR) 25 MCG/HR patch   Transdermal   Place 25 mcg onto the skin every other day.          . fluconazole (DIFLUCAN) 200 MG tablet   Oral   Take 200 mg by mouth every other day.          . furosemide (LASIX) 20 MG tablet   Oral   Take 20 mg by mouth 3 (three) times daily.          Marland Kitchen glipiZIDE (GLUCOTROL XL) 2.5 MG 24 hr tablet   Oral   Take 2.5 mg by mouth daily with breakfast.         . NUCYNTA 50 MG TABS tablet   Oral   Take 50 mg by mouth every 8 (eight) hours as  needed.          . potassium chloride (MICRO-K) 10 MEQ CR capsule      20 mEq.         . prednisoLONE acetate (PRED FORTE) 1 % ophthalmic suspension      1 drop.         . predniSONE (DELTASONE) 20 MG tablet   Oral   Take 20 mg by mouth daily with breakfast.          . promethazine (PHENERGAN) 25 MG tablet   Oral   Take 1 tablet (25 mg total) by mouth every 8 (eight) hours as needed for nausea or vomiting.   20 tablet   0   . sulfamethoxazole-trimethoprim (BACTRIM DS) 800-160 MG per tablet      TAKE ONE TABLET BY MOUTH THREE TIMES WEEKLY FOR PCP PROPHYLAXIS.         Marland Kitchen XARELTO 20 MG TABS tablet   Oral   Take 20 mg by mouth daily with supper.           BP 147/87  Pulse 76  Temp(Src) 98 F (36.7 C) (Oral)  Resp 22  Ht 5\' 6"  (1.676 m)  Wt 226 lb (102.513 kg)  BMI 36.49 kg/m2  SpO2 99% Physical Exam  Nursing note and vitals reviewed. Constitutional: He is oriented to person, place, and time. He appears well-developed and well-nourished.  HENT:  Head: Normocephalic and atraumatic.  Eyes: Conjunctivae and EOM are normal.  Neck: Neck  supple.  Cardiovascular: Normal rate and regular rhythm.   Pulmonary/Chest: He has rales.  Abdominal: Soft. Bowel sounds are normal. There is no tenderness.  Musculoskeletal: Normal range of motion.       Cervical back: Normal.       Thoracic back: Normal.       Lumbar back: Normal.  Neurological: He is alert and oriented to person, place, and time. Coordination normal.  Skin: Skin is warm and dry.  Bruising noted on extremities  Psychiatric: He has a normal mood and affect.    ED Course  Procedures (including critical care time) Labs Review Labs Reviewed  BASIC METABOLIC PANEL - Abnormal; Notable for the following:    CO2 38 (*)    Glucose, Bld 125 (*)    GFR calc non Af Amer 83 (*)    All other components within normal limits  CBC - Abnormal; Notable for the following:    RBC 3.26 (*)    Hemoglobin 11.7 (*)    HCT 37.1 (*)    MCV 113.8 (*)    MCH 35.9 (*)    Platelets 117 (*)    All other components within normal limits  PROTIME-INR - Abnormal; Notable for the following:    Prothrombin Time 15.5 (*)    All other components within normal limits  TROPONIN I   Imaging Review Dg Chest 2 View  05/01/2013   CLINICAL DATA:  58 year old male with fall and dizziness.  EXAM: CHEST  2 VIEW  COMPARISON:  07/17/2009 and prior chest radiographs  FINDINGS: Mild cardiomegaly and left thoracic surgical changes noted.  A right Port-A-Cath is present with tip overlying the cavoatrial junction.  There is no evidence of focal airspace disease, pulmonary edema, suspicious pulmonary nodule/mass, pleural effusion, or pneumothorax. No acute bony abnormalities are identified.  IMPRESSION: No evidence of acute cardiopulmonary disease.   Electronically Signed   By: Hassan Rowan M.D.   On: 05/01/2013 20:26   Ct Head Wo Contrast  05/01/2013   CLINICAL DATA:  58 year old male with fall, dizziness and blurred vision.  EXAM: CT HEAD WITHOUT CONTRAST  TECHNIQUE: Contiguous axial images were obtained from the  base of the skull through the vertex without intravenous contrast.  COMPARISON:  07/01/2006 head CT  FINDINGS: No intracranial abnormalities are identified, including mass lesion or mass effect, hydrocephalus, extra-axial fluid collection, midline shift, hemorrhage, or acute infarction.  The visualized bony calvarium is unremarkable.  IMPRESSION: Unremarkable noncontrast head CT   Electronically Signed   By: Hassan Rowan M.D.   On: 05/01/2013 20:30    EKG Interpretation    Date/Time:  Wednesday May 01 2013 19:54:51 EST Ventricular Rate:  69 PR Interval:  158 QRS Duration: 84 QT Interval:  412 QTC Calculation: 441 R Axis:   0 Text Interpretation:  Normal sinus rhythm Normal ECG Confirmed by BEATON  MD, ROBERT ([id number]) on 05/01/2013 7:56:39 PM            MDM   Final diagnoses:  Head injury  Fall    No acute head injury noted:pt is neurologically intact:cp is not out of the ordinary. Discussed follow up with pt. Pt is neurologically intact    Glendell Docker, NP 05/01/13 2127

## 2013-05-05 NOTE — ED Provider Notes (Signed)
Medical screening examination/treatment/procedure(s) were performed by non-physician practitioner and as supervising physician I was immediately available for consultation/collaboration.    Grayland Daisey L Hilmer Aliberti, MD 05/05/13 0816 

## 2013-06-21 ENCOUNTER — Encounter: Payer: Self-pay | Admitting: Family Medicine

## 2013-06-21 ENCOUNTER — Ambulatory Visit (INDEPENDENT_AMBULATORY_CARE_PROVIDER_SITE_OTHER): Payer: Medicare Other

## 2013-06-21 ENCOUNTER — Ambulatory Visit (INDEPENDENT_AMBULATORY_CARE_PROVIDER_SITE_OTHER): Payer: Medicare Other | Admitting: Family Medicine

## 2013-06-21 VITALS — BP 113/67 | HR 84 | Temp 100.1°F | Ht 66.5 in | Wt 232.8 lb

## 2013-06-21 DIAGNOSIS — R509 Fever, unspecified: Secondary | ICD-10-CM

## 2013-06-21 DIAGNOSIS — R05 Cough: Secondary | ICD-10-CM

## 2013-06-21 DIAGNOSIS — R059 Cough, unspecified: Secondary | ICD-10-CM

## 2013-06-21 DIAGNOSIS — R0602 Shortness of breath: Secondary | ICD-10-CM

## 2013-06-21 DIAGNOSIS — R058 Other specified cough: Secondary | ICD-10-CM

## 2013-06-21 MED ORDER — HYDROCODONE-HOMATROPINE 5-1.5 MG/5ML PO SYRP: 5.0000 mL | ORAL_SOLUTION | Freq: Three times a day (TID) | ORAL | Status: DC | PRN

## 2013-06-21 MED ORDER — SULFAMETHOXAZOLE-TMP DS 800-160 MG PO TABS: 1.0000 | ORAL_TABLET | Freq: Two times a day (BID) | ORAL | Status: DC

## 2013-06-21 NOTE — Addendum Note (Signed)
Addended by: Pollyann Kennedy F on: 06/21/2013 01:59 PM   Modules accepted: Orders

## 2013-06-21 NOTE — Progress Notes (Signed)
   Subjective:    Patient ID: Derek Shepherd, male    DOB: [birth year], 58 y.o.   MRN: [id number]  HPI This 58 y.o. male presents for evaluation of cough and URI sx's. He has hx of copd and wears Nasal oxygen.  He has hx of leukemia which is in remission he states..   Review of Systems No chest pain, SOB, HA, dizziness, vision change, N/V, diarrhea, constipation, dysuria, urinary urgency or frequency, myalgias, arthralgias or rash.     Objective:   Physical Exam  Vital signs noted  Well developed well nourished male.  HEENT - Head atraumatic Normocephalic                Eyes - PERRLA, Conjuctiva - clear Sclera- Clear EOMI                Ears - EAC's Wnl TM's Wnl Gross Hearing WNL                Nose - Nares patent                 Throat - oropharanx wnl Respiratory - Lungs CTA bilateral Cardiac - RRR S1 and S2 without murmur GI - Abdomen soft Nontender and bowel sounds active x 4 Extremities - No edema. Neuro - Grossly intact.      Assessment & Plan:  Productive cough - Plan: DG Chest 2 View, POCT CBC, POCT CBC, sulfamethoxazole-trimethoprim (BACTRIM DS) 800-160 MG per tablet, HYDROcodone-homatropine (HYCODAN) 5-1.5 MG/5ML syrup  Fever - Plan: DG Chest 2 View, POCT CBC, POCT CBC, sulfamethoxazole-trimethoprim (BACTRIM DS) 800-160 MG per tablet, HYDROcodone-homatropine (HYCODAN) 5-1.5 MG/5ML syrup  SOB (shortness of breath) - Plan: DG Chest 2 View, POCT CBC, POCT CBC, sulfamethoxazole-trimethoprim (BACTRIM DS) 800-160 MG per tablet, HYDROcodone-homatropine (HYCODAN) 5-1.5 MG/5ML syrup  Lysbeth Penner FNP

## 2013-06-22 LAB — CBC WITH DIFFERENTIAL
Basophils Absolute: 0 10*3/uL (ref 0.0–0.2)
Basos: 1 %
Eos: 0 %
Eosinophils Absolute: 0 10*3/uL (ref 0.0–0.4)
HCT: 35.7 % — ABNORMAL LOW (ref 37.5–51.0)
Hemoglobin: 11.6 g/dL — ABNORMAL LOW (ref 12.6–17.7)
Immature Grans (Abs): 0 10*3/uL (ref 0.0–0.1)
Immature Granulocytes: 0 %
Lymphocytes Absolute: 0.5 10*3/uL — ABNORMAL LOW (ref 0.7–3.1)
Lymphs: 8 %
MCH: 34.3 pg — ABNORMAL HIGH (ref 26.6–33.0)
MCHC: 32.5 g/dL (ref 31.5–35.7)
MCV: 106 fL — ABNORMAL HIGH (ref 79–97)
Monocytes Absolute: 0.5 10*3/uL (ref 0.1–0.9)
Monocytes: 8 %
Neutrophils Absolute: 5 10*3/uL (ref 1.4–7.0)
Neutrophils Relative %: 83 %
Platelets: 89 10*3/uL — CL (ref 150–379)
RBC: 3.38 x10E6/uL — ABNORMAL LOW (ref 4.14–5.80)
RDW: 14 % (ref 12.3–15.4)
WBC: 6 10*3/uL (ref 3.4–10.8)

## 2013-06-24 ENCOUNTER — Encounter: Payer: Self-pay | Admitting: Internal Medicine

## 2013-07-25 ENCOUNTER — Ambulatory Visit: Payer: Medicare Other | Admitting: Family Medicine

## 2013-08-05 ENCOUNTER — Telehealth: Payer: Self-pay | Admitting: Family Medicine

## 2013-08-05 NOTE — Telephone Encounter (Signed)
appt given for Friday with bill 

## 2013-08-09 ENCOUNTER — Encounter: Payer: Self-pay | Admitting: Family Medicine

## 2013-08-09 ENCOUNTER — Ambulatory Visit (INDEPENDENT_AMBULATORY_CARE_PROVIDER_SITE_OTHER): Payer: Medicare Other | Admitting: Family Medicine

## 2013-08-09 VITALS — BP 127/70 | HR 84 | Temp 97.8°F | Ht 66.5 in | Wt 233.0 lb

## 2013-08-09 DIAGNOSIS — H269 Unspecified cataract: Secondary | ICD-10-CM

## 2013-08-09 DIAGNOSIS — Z01818 Encounter for other preprocedural examination: Secondary | ICD-10-CM

## 2013-08-09 NOTE — Progress Notes (Signed)
   Subjective:    Patient ID: Derek Shepherd, male    DOB: [birth year], 58 y.o.   MRN: [id number]  HPI This 58 y.o. male presents for evaluation of pre op clearance for cataract surgery.  He has hx of ALL in remission.  He has hx of severe COPD.  He has hx of chronic pain.  His wife states he has been hallucinating and sees people who are not there on occasion.  He states he had to go to hospital again for copd exacerbation.  He is on nasal cannula oxygen.  He gets SOB and has DOE.   Review of Systems C/o sob and hallucinations No chest pain, SOB, HA, dizziness, vision change, N/V, diarrhea, constipation, dysuria, urinary urgency or frequency, myalgias, arthralgias or rash.     Objective:   Physical Exam  Vital signs noted  Chronically ill appearing male with nasal cannula oxygen in place  HEENT - Head atraumatic Normocephalic                Eyes - PERRLA, Conjuctiva - clear Sclera- Clear EOMI                Ears - EAC's Wnl TM's Wnl Gross Hearing WNL                Throat - oropharanx wnl Respiratory - Lungs diminished bilateral.  O2 sat 96% on 2 liters [place] Cardiac - RRR S1 and S2 without murmur GI - Abdomen soft Nontender and bowel sounds active x 4     Assessment & Plan:  Cataracts and pre-op clearance Recommend he get clearance from pulmonologist because the surgery is going to include conscious sedation which could affect his copd.  Hallucinations - Could be from his pain meds follow up if hallucinations persist.  [place] FNP

## 2013-10-31 ENCOUNTER — Ambulatory Visit (INDEPENDENT_AMBULATORY_CARE_PROVIDER_SITE_OTHER): Payer: Medicare Other | Admitting: Family Medicine

## 2013-10-31 ENCOUNTER — Encounter (INDEPENDENT_AMBULATORY_CARE_PROVIDER_SITE_OTHER): Payer: Self-pay

## 2013-10-31 ENCOUNTER — Encounter: Payer: Self-pay | Admitting: Family Medicine

## 2013-10-31 VITALS — BP 96/51 | HR 76 | Temp 98.5°F | Ht 66.5 in | Wt 220.0 lb

## 2013-10-31 DIAGNOSIS — R5383 Other fatigue: Secondary | ICD-10-CM

## 2013-10-31 DIAGNOSIS — R5381 Other malaise: Secondary | ICD-10-CM

## 2013-10-31 DIAGNOSIS — E119 Type 2 diabetes mellitus without complications: Secondary | ICD-10-CM

## 2013-10-31 LAB — POCT CBC
Granulocyte percent: 75 %G (ref 37–80)
HCT, POC: 33.9 % — AB (ref 43.5–53.7)
Hemoglobin: 10.9 g/dL — AB (ref 14.1–18.1)
Lymph, poc: 1 (ref 0.6–3.4)
MCH, POC: 32.6 pg — AB (ref 27–31.2)
MCHC: 32.1 g/dL (ref 31.8–35.4)
MCV: 101.7 fL — AB (ref 80–97)
MPV: 7.6 fL (ref 0–99.8)
POC Granulocyte: 3.2 (ref 2–6.9)
POC LYMPH PERCENT: 23.1 %L (ref 10–50)
Platelet Count, POC: 138 10*3/uL — AB (ref 142–424)
RBC: 3.34 M/uL — AB (ref 4.69–6.13)
RDW, POC: 14.7 %
WBC: 4.3 10*3/uL — AB (ref 4.6–10.2)

## 2013-10-31 LAB — POCT GLYCOSYLATED HEMOGLOBIN (HGB A1C): Hemoglobin A1C: 5.4

## 2013-10-31 LAB — POCT UA - MICROALBUMIN: Microalbumin Ur, POC: NEGATIVE mg/L

## 2013-10-31 NOTE — Progress Notes (Signed)
   Subjective:    Patient ID: Derek Shepherd, male    DOB: 07-04-1955, 58 y.o.   MRN: [id number]    HPI This 58 y.o. male presents for evaluation of follow up on diabetes.  He has hx of ALL and COPD. He sees oncology for ALL.  Hgbaic 2/15 was 5.6%.   He is on prednisone for ALL and this causes  Hyperglycemia.  He is diet controlled.  He takes glipizide 2.$RemoveBeforeDEI'5mg'BUDoQMbKFoCTDRoi$  qod because he was having  fsbs fasting of 50 and so this was changed to qod.  He has no acute complaints and has no c/o Breathing difficulty.   Review of Systems No chest pain, SOB, HA, dizziness, vision change, N/V, diarrhea, constipation, dysuria, urinary urgency or frequency, myalgias, arthralgias or rash.     Objective:   Physical Exam  Vital signs noted  Chronically ill appearing male with nasal cannula oxygen intact.  HEENT - Head atraumatic Normocephalic                Eyes - PERRLA, Conjuctiva - clear Sclera- Clear EOMI                Ears - EAC's Wnl TM's Wnl Gross Hearing WNL                Throat - oropharanx wnl Respiratory - Lungs  Cardiac - RRR S1 and S2 without murmur GI - Abdomen soft Nontender and bowel sounds active x 4 Extremities - No edema.  Neuro - Grossly intact. Foot exam - bilateral feet with no ulcers or calluses and normal sensation to monofilament.     Assessment & Plan:  Diabetes mellitus type 2, controlled - Plan: POCT glycosylated hemoglobin (Hb A1C), POCT UA - Microalbumin, CMP14+EGFR, Lipid panel  Other fatigue - Plan: POCT CBC  Follow up in 3 months  Lysbeth Penner FNP

## 2013-11-01 LAB — CMP14+EGFR
ALT: 17 IU/L (ref 0–44)
AST: 27 IU/L (ref 0–40)
Albumin/Globulin Ratio: 1.9 (ref 1.1–2.5)
Albumin: 4.2 g/dL (ref 3.5–5.5)
Alkaline Phosphatase: 79 IU/L (ref 39–117)
BUN/Creatinine Ratio: 17 (ref 9–20)
BUN: 14 mg/dL (ref 6–24)
CO2: 33 mmol/L — ABNORMAL HIGH (ref 18–29)
Calcium: 9.5 mg/dL (ref 8.7–10.2)
Chloride: 95 mmol/L — ABNORMAL LOW (ref 97–108)
Creatinine, Ser: 0.81 mg/dL (ref 0.76–1.27)
GFR calc Af Amer: 116 mL/min/{1.73_m2} (ref >59)
GFR calc non Af Amer: 100 mL/min/{1.73_m2} (ref >59)
Globulin, Total: 2.2 g/dL (ref 1.5–4.5)
Glucose: 112 mg/dL — ABNORMAL HIGH (ref 65–99)
Potassium: 3.8 mmol/L (ref 3.5–5.2)
Sodium: 144 mmol/L (ref 134–144)
Total Bilirubin: 0.2 mg/dL (ref 0.0–1.2)
Total Protein: 6.4 g/dL (ref 6.0–8.5)

## 2013-11-01 LAB — LIPID PANEL
Chol/HDL Ratio: 4.5 ratio units (ref 0.0–5.0)
Cholesterol, Total: 234 mg/dL — ABNORMAL HIGH (ref 100–199)
HDL: 52 mg/dL (ref >39)
LDL Calculated: 144 mg/dL — ABNORMAL HIGH (ref 0–99)
Triglycerides: 189 mg/dL — ABNORMAL HIGH (ref 0–149)
VLDL Cholesterol Cal: 38 mg/dL (ref 5–40)

## 2013-11-08 ENCOUNTER — Telehealth: Payer: Self-pay | Admitting: Family Medicine

## 2013-11-10 NOTE — Telephone Encounter (Signed)
Results were discussed with patient's wife.

## 2014-01-21 ENCOUNTER — Telehealth: Payer: Self-pay | Admitting: Family Medicine

## 2014-01-21 NOTE — Telephone Encounter (Signed)
Pt given appt tomorrow with MMM @ 2:45

## 2014-01-22 ENCOUNTER — Encounter (INDEPENDENT_AMBULATORY_CARE_PROVIDER_SITE_OTHER): Payer: Self-pay

## 2014-01-22 ENCOUNTER — Encounter: Payer: Self-pay | Admitting: Nurse Practitioner

## 2014-01-22 ENCOUNTER — Ambulatory Visit (INDEPENDENT_AMBULATORY_CARE_PROVIDER_SITE_OTHER): Payer: Medicare Other | Admitting: Nurse Practitioner

## 2014-01-22 VITALS — BP 124/71 | HR 92 | Temp 98.1°F | Ht 66.5 in | Wt 222.0 lb

## 2014-01-22 DIAGNOSIS — R569 Unspecified convulsions: Secondary | ICD-10-CM

## 2014-01-22 DIAGNOSIS — R441 Visual hallucinations: Secondary | ICD-10-CM

## 2014-01-22 NOTE — Progress Notes (Signed)
   Subjective:    Patient ID: Derek Shepherd, male    DOB: [birth year], 58 y.o.   MRN: [id number]  HPI Patient is here today complaining of a witness seizure that happens two days ago. His wife reports he was shaking and clinching his fist that lasted about 2 minutes. His wife reports he seems like he was in deep sleep and hard to arouse during the episode. He denies any bladder or bower elimination. This is a second time she thinks he might have had a seizure. He denies any injury or tongue biting. His wife reports he hallucinates and seems to be reaching for things out of the air that's not there. The wife reports he has been more and more confuse lately. Pt reports he has been unable to close his fist for awhile. He has history of ALL and is seeing an oncologist.    Review of Systems  Respiratory: Positive for shortness of breath (on home O2.).   All other systems reviewed and are negative.      Objective:   Physical Exam  Constitutional: He is oriented to person, place, and time. He appears well-developed and well-nourished.  HENT:  Head: Normocephalic.  Eyes: Conjunctivae are normal. Pupils are equal, round, and reactive to light.  Neck: Normal range of motion.  Cardiovascular: Regular rhythm.  Exam reveals no gallop.   No murmur heard. Pulmonary/Chest: Effort normal.  Abdominal: Soft.  Musculoskeletal: He exhibits no edema or tenderness.  Grips equal bil  Neurological: He is alert and oriented to person, place, and time. He has normal reflexes. No cranial nerve deficit.  Skin: Skin is warm.  Psychiatric: He has a normal mood and affect. His behavior is normal. Judgment and thought content normal.    BP 124/71 mmHg  Pulse 92  Temp(Src) 98.1 F (36.7 C) (Oral)  Ht 5' 6.5" (1.689 m)  Wt 222 lb (100.699 kg)  BMI 35.30 kg/m2  SpO2 95%       Assessment & Plan:   1. Convulsions, unspecified convulsion type   2. Visual hallucinations    Orders Placed This Encounter    Procedures  . Ambulatory referral to Neurology    Referral Priority:  Routine    Referral Type:  Consultation    Referral Reason:  Specialty Services Required    Requested Specialty:  Neurology    Number of Visits Requested:  1   If occurs again need  To go to the ER Continue all meds

## 2014-01-22 NOTE — Patient Instructions (Signed)
Seizure, Adult A seizure means there is unusual activity in the brain. A seizure can cause changes in attention or behavior. Seizures often cause shaking (convulsions). Seizures often last from 30 seconds to 2 minutes. HOME CARE   If you are given medicines, take them exactly as told by your doctor.  Keep all doctor visits as told.  Do not swim or drive until your doctor says it is okay.  Teach others what to do if you have a seizure. They should:  Lay you on the ground.  Put a cushion under your head.  Loosen any tight clothing around your neck.  Turn you on your side.  Stay with you until you get better. GET HELP RIGHT AWAY IF:   The seizure lasts longer than 2 to 5 minutes.  The seizure is very bad.  The person does not wake up after the seizure.  The person's attention or behavior changes. Drive the person to the emergency room or call your local emergency services (911 in [place] MAKE SURE YOU:   Understand these instructions.  Will watch your condition.  Will get help right away if you are not doing well or get worse. Document Released: 08/10/2007 Document Revised: 05/16/2011 Document Reviewed: 02/09/2011 Exit[employer] Patient Information 2015 [employer]. This information is not intended to replace advice given to you by your health care provider. Make sure you discuss any questions you have with your health care provider.  

## 2014-01-23 LAB — ANEMIA PROFILE B
BASOS ABS: 0 10*3/uL (ref 0.0–0.2)
Basos: 1 %
EOS: 3 %
Eosinophils Absolute: 0.1 10*3/uL (ref 0.0–0.4)
Ferritin: 960 ng/mL — ABNORMAL HIGH (ref 30–400)
Folate: >19.9 ng/mL (ref >3.0)
HEMATOCRIT: 30.6 % — AB (ref 37.5–51.0)
HEMOGLOBIN: 10 g/dL — AB (ref 12.6–17.7)
Immature Grans (Abs): 0 10*3/uL (ref 0.0–0.1)
Immature Granulocytes: 0 %
Iron Saturation: 24 % (ref 15–55)
Iron: 48 ug/dL (ref 40–155)
LYMPHS ABS: 0.9 10*3/uL (ref 0.7–3.1)
Lymphs: 23 %
MCH: 34.2 pg — ABNORMAL HIGH (ref 26.6–33.0)
MCHC: 32.7 g/dL (ref 31.5–35.7)
MCV: 105 fL — AB (ref 79–97)
MONOCYTES: 9 %
Monocytes Absolute: 0.3 10*3/uL (ref 0.1–0.9)
NEUTROS ABS: 2.4 10*3/uL (ref 1.4–7.0)
Neutrophils Relative %: 64 %
Platelets: 122 10*3/uL — ABNORMAL LOW (ref 150–379)
RBC: 2.92 x10E6/uL — AB (ref 4.14–5.80)
RDW: 15.2 % (ref 12.3–15.4)
RETIC CT PCT: 1.4 % (ref 0.6–2.6)
TIBC: 204 ug/dL — AB (ref 250–450)
UIBC: 156 ug/dL (ref 150–375)
Vitamin B-12: 571 pg/mL (ref 211–946)
WBC: 3.8 10*3/uL (ref 3.4–10.8)

## 2014-01-23 LAB — CMP14+EGFR
A/G RATIO: 1.6 (ref 1.1–2.5)
ALT: 24 IU/L (ref 0–44)
AST: 37 IU/L (ref 0–40)
Albumin: 4.1 g/dL (ref 3.5–5.5)
Alkaline Phosphatase: 91 IU/L (ref 39–117)
BUN/Creatinine Ratio: 16 (ref 9–20)
BUN: 12 mg/dL (ref 6–24)
CALCIUM: 9.3 mg/dL (ref 8.7–10.2)
CHLORIDE: 94 mmol/L — AB (ref 97–108)
CO2: 34 mmol/L — ABNORMAL HIGH (ref 18–29)
Creatinine, Ser: 0.75 mg/dL — ABNORMAL LOW (ref 0.76–1.27)
GFR calc Af Amer: 119 mL/min/{1.73_m2} (ref >59)
GFR, EST NON AFRICAN AMERICAN: 103 mL/min/{1.73_m2} (ref >59)
GLUCOSE: 121 mg/dL — AB (ref 65–99)
Globulin, Total: 2.5 g/dL (ref 1.5–4.5)
Potassium: 4.2 mmol/L (ref 3.5–5.2)
Sodium: 142 mmol/L (ref 134–144)
Total Bilirubin: <0.2 mg/dL (ref 0.0–1.2)
Total Protein: 6.6 g/dL (ref 6.0–8.5)

## 2014-01-29 ENCOUNTER — Ambulatory Visit (INDEPENDENT_AMBULATORY_CARE_PROVIDER_SITE_OTHER): Payer: Medicare Other | Admitting: Neurology

## 2014-01-29 ENCOUNTER — Encounter: Payer: Self-pay | Admitting: Neurology

## 2014-01-29 VITALS — BP 122/64 | HR 84 | Ht 66.0 in | Wt 220.0 lb

## 2014-01-29 DIAGNOSIS — C9100 Acute lymphoblastic leukemia not having achieved remission: Secondary | ICD-10-CM

## 2014-01-29 DIAGNOSIS — R569 Unspecified convulsions: Secondary | ICD-10-CM

## 2014-01-29 NOTE — Patient Instructions (Signed)
1. Schedule MRI brain with and without contrast 2. Routine EEG 3. As per [place] driving laws, one should not drive until 6 months seizure-free  Seizure Precautions: 1. If medication has been prescribed for you to prevent seizures, take it exactly as directed.  Do not stop taking the medicine without talking to your doctor first, even if you have not had a seizure in a long time.   2. Avoid activities in which a seizure would cause danger to yourself or to others.  Don't operate dangerous machinery, swim alone, or climb in high or dangerous places, such as on ladders, roofs, or girders.  Do not drive unless your doctor says you may.  3. If you have any warning that you may have a seizure, lay down in a safe place where you can't hurt yourself.    4.  No driving for 6 months from last seizure, as per [facility].   Please refer to the following link on the [place] website for more information: http://[email or web address].org/[email or web address]/driving/drivingu.cfm   5.  Maintain good sleep hygiene.  6.  Contact your doctor if you have any problems that may be related to the medicine you are taking.  7.  Call 911 and bring the patient back to the ED if:        A.  The seizure lasts longer than 5 minutes.       B.  The patient doesn't awaken shortly after the seizure  C.  The patient has new problems such as difficulty seeing, speaking or moving  D.  The patient was injured during the seizure  E.  The patient has a temperature over 102 F (39C)  F.  The patient vomited and now is having trouble breathing

## 2014-01-29 NOTE — Progress Notes (Signed)
NEUROLOGY CONSULTATION NOTE  DWAYNE BULKLEY MRN: [id number] DOB: [birth year]  Referring provider: Chevis Pretty, FNP Primary care provider: Dr. Redge Gainer  Reason for consult:  Seizure, hallucinations  Thank you for your kind referral of CHANCELER PULLIN for consultation of the above symptoms. Although his history is well known to you, please allow me to reiterate it for the purpose of our medical record. The patient was accompanied to the clinic by his wife and friend who also provide collateral information. Records and images were personally reviewed where available.  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 He was in his usual state of health and recalls being stressed that day, went to bed, then woke up the next morning being told he had a seizure. His wife woke up at 11:30pm to the bed shaking, all his extremities were extended and stiff with shaking lasting 3 minutes. He was unresponsive after, then said "what" to his wife, then went back to sleep. There was no tongue bite, urinary incontinence. He denies any recent illnesses, alcohol intake, or sleep deprivation.  He went to his PCP 2 days later, CBC showed WBC 3.8, Hct 30.6, platelets 122; CMP unremarkable.   They deny any episodes of staring/unresponsiveness, gaps in time, olfactory/gustatory hallucinations, focal numbness/tingling/weakness. His wife reports that he frequently "messes with his clothes or bedsheets with both hands, concerning for automatisms, however no dystonic posturing or unresponsiveness. His hands would swell up at night and he would be unable to flex his fingers. They report visual hallucinations more over the past few  months, for instance he would be watching TV and see a little boy in the room. He would think he and his brother were working on something. His wife reports he would get confused and talk nonsense. They have noted occasional body jerking in wakefulness and sleep. He has occasional tingling in both hands. He has a history of left foot numbness since a gluteal hematoma in 2011. He has tinnitus in both ears. He denies any recent falls, but did fall early in the ear after slipping in snow, he hit his head, a head [facility] at [facility] done a few days later reportedly unremarkable.He has occasional mild occipital and frontal headaches, with no nausea, vomiting, some phonophobia, he does not take any medications. He has a history of left Bell's palsy in the 1980s with residual synkinesis.   Epilepsy Risk Factors:  He had a normal birth and early development.  There is no history of febrile convulsions, CNS infections such as meningitis/encephalitis, significant traumatic brain injury, neurosurgical procedures, or family history of seizures.   PAST MEDICAL HISTORY: Past Medical History  Diagnosis Date  . Leukemia-lymphoma, T-cell, acute, HTLV-I-associated   . COPD (chronic obstructive pulmonary disease)   . DVT (deep venous thrombosis)   . Personal history of colonic  adenoma 01/22/2008    PAST SURGICAL HISTORY: Past Surgical History  Procedure Laterality Date  . Cholecystectomy    . Lung surgery    . Exploratory laparotomy    . Hernia repair      MEDICATIONS: Current Outpatient Prescriptions on File Prior to Visit  Medication Sig Dispense Refill  . albuterol (PROVENTIL HFA;VENTOLIN HFA) 108 (90 BASE) MCG/ACT inhaler Inhale 2 puffs into the lungs  every 6 (six) hours as needed for wheezing or shortness of breath.    Marland Kitchen albuterol (PROVENTIL) (2.5 MG/3ML) 0.083% nebulizer solution Take 2.5 mg by nebulization every 6 (six) hours as needed for wheezing or shortness of breath.    . ALPRAZolam  (XANAX) 0.25 MG tablet 3 (three) times daily as needed.     Marland Kitchen azaTHIOprine (IMURAN) 50 MG tablet Take 50 mg by mouth daily.     . citalopram (CELEXA) 20 MG tablet Take 20 mg by mouth daily.     . fentaNYL (DURAGESIC - DOSED MCG/HR) 25 MCG/HR patch Place 25 mcg onto the skin every other day.     . fluconazole (DIFLUCAN) 200 MG tablet Take 200 mg by mouth every other day.     . fluticasone (FLONASE) 50 MCG/ACT nasal spray Place 1 spray into the nose.    . furosemide (LASIX) 80 MG tablet 80 mg 2 (two) times daily.   1  . glipiZIDE (GLUCOTROL XL) 2.5 MG 24 hr tablet Take 2.5 mg by mouth daily with breakfast.    . lactulose (CHRONULAC) 10 GM/15ML solution Take 10 g by mouth.    . Multiple Vitamins tablet Take 1 tablet by mouth.    . NUCYNTA 50 MG TABS tablet Take 50 mg by mouth every 6 (six) hours as needed.     . potassium chloride (MICRO-K) 10 MEQ CR capsule 20 mEq. Takes 2 capsules daily    . predniSONE (DELTASONE) 20 MG tablet Take 20 mg by mouth daily with breakfast.     . SYMBICORT 160-4.5 MCG/ACT inhaler Inhale 2 puffs into the lungs every morning.   2  . tiotropium (SPIRIVA HANDIHALER) 18 MCG inhalation capsule Place 1 capsule into inhaler and inhale. Every AM    . XARELTO 20 MG TABS tablet Take 20 mg by mouth daily with supper.     . promethazine (PHENERGAN) 25 MG tablet Take 1 tablet (25 mg total) by mouth every 8 (eight) hours as needed for nausea or vomiting. (Patient not taking: Reported on 01/29/2014) 20 tablet 0   No current facility-administered medications on file prior to visit.    ALLERGIES: Allergies  Allergen Reactions  . Nsaids Shortness Of Breath    FAMILY HISTORY: No family history on file.  SOCIAL HISTORY: History   Social History  . Marital Status: Married    Spouse Name: N/A    Number of Children: N/A  . Years of Education: N/A   Occupational History  . Not on file.   Social History Main Topics  . Smoking status: Former Research scientist (life sciences)  . Smokeless tobacco:  Not on file  . Alcohol Use: No  . Drug Use: No  . Sexual Activity: Not on file   Other Topics Concern  . Not on file   Social History Narrative    REVIEW OF SYSTEMS: Constitutional: No fevers, chills, or sweats, no generalized fatigue, change in appetite Eyes: No visual changes, double vision, eye pain Ear, nose and throat: No hearing loss, ear pain, nasal congestion, sore throat Cardiovascular: No chest pain, palpitations Respiratory:  No shortness of breath at rest or with exertion, wheezes GastrointestinaI: No nausea, vomiting, diarrhea, abdominal pain, fecal incontinence. +constipation Genitourinary:  No dysuria, urinary retention or frequency Musculoskeletal:  No neck pain, +back pain Integumentary: No rash, pruritus, skin lesions Neurological: as above Psychiatric: No depression, insomnia, anxiety Endocrine: No palpitations, fatigue, diaphoresis, mood swings, change in appetite, change in weight, increased thirst Hematologic/Lymphatic:  No anemia, purpura, petechiae. Allergic/Immunologic: no  itchy/runny eyes, nasal congestion, recent allergic reactions, rashes  PHYSICAL EXAM: Filed Vitals:   01/29/14 1215  BP: 122/64  Pulse: 84   General: No acute distress, on O2 nasal cannula Head:  Normocephalic/atraumatic Eyes: Fundoscopic exam shows bilateral sharp discs, no vessel changes, exudates, or hemorrhages Neck: supple, no paraspinal tenderness, full range of motion Back: No paraspinal tenderness Heart: regular rate and rhythm Lungs: Clear to auscultation bilaterally. Vascular: No carotid bruits. Skin/Extremities: No rash, no edema Neurological Exam: Mental status: alert and oriented to person, place, and month and year, does not know exact date, did not know president, no dysarthria or aphasia, Fund of knowledge is appropriate.  Recent and remote memory are intact.  Attention and concentration are normal.    Able to name objects and repeat phrases. Cranial nerves: CN  I: not tested CN II: pupils equal, round and reactive to light, visual fields intact, fundi unremarkable. CN III, IV, VI:  full range of motion, no nystagmus, no ptosis CN V: facial sensation intact CN VII: upper and lower face symmetric, +synkinesis on left cheek and chin CN VIII: hearing intact to finger rub CN IX, X: gag intact, uvula midline CN XI: sternocleidomastoid and trapezius muscles intact CN XII: tongue midline Bulk & Tone: normal, no fasciculations. Motor: 5/5 throughout with no pronator drift. Sensation: intact to light touch, cold, pin on both UE, decreased cold on both calves, hyperesthesia to pin on left foot, intact vibration and joint position sense.  No extinction to double simultaneous stimulation.  Romberg test negative Deep Tendon Reflexes: +2 throughout except for absent ankle jerks, no ankle clonus Plantar responses: downgoing bilaterally Cerebellar: no incoordination on finger to nose, heel to shin. No dysdiadochokinesia Gait: slow and cautious, no ataxia, unable to tandem walk Tremor: none  IMPRESSION: This is a 58 year old right-handed man with a history of ALL s/p stem cell transplant, chemotherapy, radiation, PE/DVT, GVD, presenting with new onset seizure. MRI brain with and without contrast and routine EEG will be ordered.  I am concerned with his history of ALL for brain metastasis. We discussed that after an initial seizure, unless there are significant risk factors, an abnormal neurological exam, an EEG showing epileptiform abnormalities, and/or abnormal neuroimaging, treatment with an antiepileptic drug is not indicated. We discussed [place] driving restrictions which indicate a patient needs to free of seizures or events of altered awareness for 6 months prior to resuming driving. The patient agreed to comply with these restrictions.  Seizure precautions were discussed which include no driving, no bathing in a tub, no swimming alone, no cooking over an open flame, no  operating dangerous machinery, and no activities which may endanger oneself or someone else. He will follow-up after the tests.   Thank you for allowing me to participate in the care of this patient. Please do not hesitate to call for any questions or concerns.   Ellouise Newer, M.D.

## 2014-02-01 ENCOUNTER — Encounter: Payer: Self-pay | Admitting: Neurology

## 2014-02-04 ENCOUNTER — Ambulatory Visit (INDEPENDENT_AMBULATORY_CARE_PROVIDER_SITE_OTHER): Payer: Medicare Other | Admitting: Neurology

## 2014-02-04 DIAGNOSIS — R569 Unspecified convulsions: Secondary | ICD-10-CM

## 2014-02-05 NOTE — Procedures (Signed)
ELECTROENCEPHALOGRAM REPORT  Date of Study: 02/04/2014  Patient's Name: Derek Shepherd MRN: [id number] Date of Birth: 15-Jul-1955  Referring Provider: Dr. Ellouise Newer  Clinical History: ALL s/p stem cell transplant, chemotherapy, radiation, PE/DVT, GVD, presenting with new onset seizure  Medications: Xanax, Nucynta, Imuran, Celexa, Glipizide, Xarelto, Fentanyl patch, Lasix  Technical Summary: A multichannel digital EEG recording measured by the international 10-20 system with electrodes applied with paste and impedances below 5000 ohms performed in our laboratory with EKG monitoring in an awake patient.  Hyperventilation was not performed.Photic stimulation was performed.  The digital EEG was referentially recorded, reformatted, and digitally filtered in a variety of bipolar and referential montages for optimal display.    Description: The patient is awake during the recording.  During maximal wakefulness, there is a symmetric, medium voltage 9 Hz posterior dominant rhythm that attenuates with eye opening.  The record is symmetric, however the left temporal derivations are obscured by muscle artifact throughout majority of the recording. Deeper stages of sleep were not seen.Photic stimulation did not elicit any abnormalities.  There were no epileptiform discharges or electrographic seizures seen in between artifact.  EKG lead was unremarkable.  Impression: This awake EEG is normal, however limited due to muscle artifact obscuring left temporal derivations.  Clinical Correlation: A normal EEG does not exclude a clinical diagnosis of epilepsy. This study is limited due to muscle artifact on left temporal derivations throughout most of the recording.  If further clinical questions remain, prolonged EEG may be helpful.  Clinical correlation is advised.   Ellouise Newer, M.D.

## 2014-02-12 ENCOUNTER — Ambulatory Visit (HOSPITAL_COMMUNITY)
Admission: RE | Admit: 2014-02-12 | Discharge: 2014-02-12 | Disposition: A | Discharge status: Home / Self Care | Payer: Medicare Other | Source: Ambulatory Visit | Attending: Neurology | Admitting: Neurology

## 2014-02-12 DIAGNOSIS — Z8572 Personal history of non-Hodgkin lymphomas: Secondary | ICD-10-CM | POA: Insufficient documentation

## 2014-02-12 DIAGNOSIS — R569 Unspecified convulsions: Secondary | ICD-10-CM | POA: Diagnosis present

## 2014-02-12 MED ORDER — GADOBENATE DIMEGLUMINE 529 MG/ML IV SOLN
20.0000 mL | Freq: Once | INTRAVENOUS | Status: AC | PRN
  Administered 2014-02-12: 20 mL via INTRAVENOUS

## 2014-02-13 ENCOUNTER — Telehealth: Payer: Self-pay | Admitting: Family Medicine

## 2014-02-14 NOTE — Telephone Encounter (Signed)
Appointment given for Monday with Bill Oxford, FNP. 

## 2014-02-17 ENCOUNTER — Ambulatory Visit (INDEPENDENT_AMBULATORY_CARE_PROVIDER_SITE_OTHER): Payer: Medicare Other | Admitting: Family Medicine

## 2014-02-17 ENCOUNTER — Encounter: Payer: Self-pay | Admitting: Family Medicine

## 2014-02-17 VITALS — Ht 66.5 in | Wt 221.0 lb

## 2014-02-17 DIAGNOSIS — E162 Hypoglycemia, unspecified: Secondary | ICD-10-CM

## 2014-02-17 NOTE — Progress Notes (Signed)
   Subjective:    Patient ID: Derek Shepherd, male    DOB: June 26, 1955, 58 y.o.   MRN: [id number]  HPI Patient has hx of ALL and has been taking prednisone which has caused hyperglycemia and this has been tx'd with glipizide.  He had a hypoglycemia rxn and was hospitalized and taken off glipizide. His fsbs has been running from 90's to 110's.   He has lost weight.  Review of Systems  Constitutional: Negative for fever.  HENT: Negative for ear pain.   Eyes: Negative for discharge.  Respiratory: Negative for cough.   Cardiovascular: Negative for chest pain.  Gastrointestinal: Negative for abdominal distention.  Endocrine: Negative for polyuria.  Genitourinary: Negative for difficulty urinating.  Musculoskeletal: Negative for gait problem and neck pain.  Skin: Negative for color change and rash.  Neurological: Negative for speech difficulty and headaches.  Psychiatric/Behavioral: Negative for agitation.       Objective:    Ht 5' 6.5" (1.689 m)  Wt 221 lb (100.245 kg)  BMI 35.14 kg/m2 Physical Exam  Constitutional: He is oriented to person, place, and time. He appears well-developed and well-nourished.  HENT:  Head: Normocephalic and atraumatic.  Mouth/Throat: Oropharynx is clear and moist.  Eyes: Pupils are equal, round, and reactive to light.  Neck: Normal range of motion. Neck supple.  Cardiovascular: Normal rate and regular rhythm.   No murmur heard. Pulmonary/Chest: Effort normal and breath sounds normal.  Abdominal: Soft. Bowel sounds are normal. There is no tenderness.  Neurological: He is alert and oriented to person, place, and time.  Skin: Skin is warm and dry.  Psychiatric: He has a normal mood and affect.          Assessment & Plan:     ICD-9-CM ICD-10-CM   1. Hypoglycemia 251.2 E16.2    DC glipizide and follow up in 3 months for diabetes visit.  Return if symptoms worsen or fail to improve.  Lysbeth Penner FNP

## 2014-03-11 ENCOUNTER — Other Ambulatory Visit: Payer: Self-pay | Admitting: Family Medicine

## 2014-03-11 ENCOUNTER — Ambulatory Visit (INDEPENDENT_AMBULATORY_CARE_PROVIDER_SITE_OTHER): Payer: Medicare Other | Admitting: Neurology

## 2014-03-11 ENCOUNTER — Encounter: Payer: Self-pay | Admitting: Neurology

## 2014-03-11 VITALS — BP 126/62 | HR 80 | Ht 66.0 in | Wt 219.0 lb

## 2014-03-11 DIAGNOSIS — C9100 Acute lymphoblastic leukemia not having achieved remission: Secondary | ICD-10-CM

## 2014-03-11 DIAGNOSIS — R569 Unspecified convulsions: Secondary | ICD-10-CM

## 2014-03-11 NOTE — Progress Notes (Signed)
NEUROLOGY FOLLOW UP OFFICE NOTE  Derek Shepherd [id number]  HISTORY OF PRESENT ILLNESS: I had the pleasure of seeing Derek Shepherd in follow-up in the neurology clinic on 03/11/2014.  The patient was last seen 6 weeks ago for new onset seizure on 01/20/14. He is again accompanied by his wife who supplements the history today.  Records and images were personally reviewed where available.  I personally reviewed MRI brain with and without contrast which did not show any acute changes, no abnormal signal or enhancement seen. His routine EEG was normal overall, however the left temporal derivations were obscured by artifact.   Since his last visit, he reports feeling well. No further seizures. He fell twice on Christmas day, no loss of consciousness. With the first fall, he tripped and bruised his left arm. That night, he rolled out of bed and fell. His wife denied any seizure activity. She reports on and off confusion, yesterday he was putting eyedrops and asked if he should put iodine in his eyes.  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 He was in his usual state of health and recalls being stressed that day, went to bed, then woke up the next morning being told he had a seizure. His wife woke up at 11:30pm to the bed shaking, all his extremities were extended and stiff with shaking lasting 3 minutes. He was unresponsive after, then said "what" to his wife, then went back to sleep. There was no tongue bite, urinary incontinence. He denies any recent illnesses, alcohol intake, or sleep deprivation. He went to his PCP 2 days later, CBC showed WBC 3.8, Hct 30.6, platelets 122; CMP unremarkable.   They deny any episodes of staring/unresponsiveness, gaps in time,  olfactory/gustatory hallucinations, focal numbness/tingling/weakness. His wife reports that he frequently "messes with his clothes or bedsheets with both hands, no dystonic posturing or unresponsiveness. His hands would swell up at night and he would be unable to flex his fingers. They report visual hallucinations more over the past few months, for instance he would be watching TV and see a little boy in the room. He would think he and his brother were working on something. His wife reports he would get confused and talk nonsense. They have noted occasional body jerking in wakefulness and sleep. He has occasional tingling in both hands. He has a history of left foot numbness since a gluteal hematoma in 2011. He has tinnitus in both ears. He has a history of left Bell's palsy in the 1980s with residual synkinesis.   He had a normal birth and early development. There is no history of febrile convulsions, CNS infections such as meningitis/encephalitis, significant traumatic brain injury, neurosurgical procedures, or family history of seizures.  PAST MEDICAL HISTORY: Past Medical History  Diagnosis Date  . Leukemia-lymphoma, T-cell, acute, HTLV-I-associated   . COPD (chronic obstructive pulmonary disease)   . DVT (deep venous thrombosis)   . Personal history of colonic  adenoma 01/22/2008    MEDICATIONS: Current Outpatient Prescriptions on File Prior to Visit  Medication Sig Dispense Refill  . albuterol (PROVENTIL HFA;VENTOLIN HFA) 108 (90 BASE) MCG/ACT inhaler Inhale 2 puffs into the lungs every 6 (six) hours as needed for wheezing or shortness of breath.    Marland Kitchen albuterol (PROVENTIL) (2.5 MG/3ML) 0.083% nebulizer solution Take  2.5 mg by nebulization every 6 (six) hours as needed for wheezing or shortness of breath.    . ALPRAZolam (XANAX) 0.25 MG tablet 3 (three) times daily as needed.     Marland Kitchen azaTHIOprine (IMURAN) 50 MG tablet Take 50 mg by mouth daily.     . citalopram (CELEXA) 20 MG tablet Take 20  mg by mouth daily.     . fentaNYL (DURAGESIC - DOSED MCG/HR) 25 MCG/HR patch Place 25 mcg onto the skin every other day.     . fluconazole (DIFLUCAN) 200 MG tablet Take 200 mg by mouth every other day.     . fluticasone (FLONASE) 50 MCG/ACT nasal spray Place 1 spray into the nose.    . folic acid (FOLVITE) 1 MG tablet Take 1 mg by mouth.    . furosemide (LASIX) 80 MG tablet 80 mg 2 (two) times daily.   1  . lactulose (CHRONULAC) 10 GM/15ML solution Take 10 g by mouth.    . Multiple Vitamins tablet Take 1 tablet by mouth.    . NUCYNTA 50 MG TABS tablet Take 50 mg by mouth every 6 (six) hours as needed.     Derek Shepherd Glycol-Propyl Glycol (SYSTANE PRESERVATIVE FREE) 0.4-0.3 % SOLN Place 1 drop into both eyes. 1 drop in each eye prn    . potassium chloride (MICRO-K) 10 MEQ CR capsule 20 mEq. Takes 2 capsules daily    . predniSONE (DELTASONE) 20 MG tablet Take 20 mg by mouth daily with breakfast.     . sulfamethoxazole-trimethoprim (BACTRIM DS,SEPTRA DS) 800-160 MG per tablet Take 1 tablet by mouth.    . SYMBICORT 160-4.5 MCG/ACT inhaler Inhale 2 puffs into the lungs every morning.   2  . vitamin B-12 (CYANOCOBALAMIN) 1000 MCG tablet Take 1,000 mcg by mouth.    Derek Shepherd 20 MG TABS tablet Take 20 mg by mouth daily with supper.     . tiotropium (SPIRIVA HANDIHALER) 18 MCG inhalation capsule Place 1 capsule into inhaler and inhale. Every AM     No current facility-administered medications on file prior to visit.    ALLERGIES: Allergies  Allergen Reactions  . Nsaids Shortness Of Breath    FAMILY HISTORY: No family history on file.  SOCIAL HISTORY: History   Social History  . Marital Status: Married    Spouse Name: N/A    Number of Children: N/A  . Years of Education: N/A   Occupational History  . Not on file.   Social History Main Topics  . Smoking status: Former Research scientist (life sciences)  . Smokeless tobacco: Not on file  . Alcohol Use: No  . Drug Use: No  . Sexual Activity: Not on file    Other Topics Concern  . Not on file   Social History Narrative    REVIEW OF SYSTEMS: Constitutional: No fevers, chills, or sweats, no generalized fatigue, change in appetite Eyes: No visual changes, double vision, eye pain Ear, nose and throat: No hearing loss, ear pain, nasal congestion, sore throat Cardiovascular: No chest pain, palpitations Respiratory:  No shortness of breath at rest or with exertion, wheezes GastrointestinaI: No nausea, vomiting, diarrhea, abdominal pain, fecal incontinence Genitourinary:  No dysuria, urinary retention or frequency Musculoskeletal:  No neck pain, back pain Integumentary: No rash, pruritus, skin lesions Neurological: as above Psychiatric: No depression, insomnia, anxiety Endocrine: No palpitations, fatigue, diaphoresis, mood swings, change in appetite, change in weight, increased thirst Hematologic/Lymphatic:  No anemia, purpura, petechiae. Allergic/Immunologic: no itchy/runny eyes, nasal congestion, recent allergic reactions,  rashes  PHYSICAL EXAM: Filed Vitals:   03/11/14 1259  BP: 126/62  Pulse: 80   General: No acute distress, on O2 nasal cannula Head: Normocephalic/atraumatic Eyes: Fundoscopic exam shows bilateral sharp discs, no vessel changes, exudates, or hemorrhages Neck: supple, no paraspinal tenderness, full range of motion Back: No paraspinal tenderness Heart: regular rate and rhythm Lungs: Clear to auscultation bilaterally. Vascular: No carotid bruits. Skin/Extremities: No rash, no edema Neurological Exam: Mental status: alert and oriented to person, place, time. no dysarthria or aphasia, Fund of knowledge is appropriate. Recent and remote memory are intact. 2/3 delayed recall. Attention and concentration are normal. Able to name objects and repeat phrases. Cranial nerves: CN I: not tested CN II: pupils equal, round and reactive to light, visual fields intact, fundi unremarkable. CN III, IV, VI: full range of  motion, no nystagmus, no ptosis CN V: facial sensation intact CN VII: upper and lower face symmetric, +synkinesis on left cheek and chin CN VIII: hearing intact to finger rub CN IX, X: gag intact, uvula midline CN XI: sternocleidomastoid and trapezius muscles intact CN XII: tongue midline Bulk & Tone: normal, no fasciculations. Motor: 5/5 throughout with no pronator drift. Sensation: intact to light touch. No extinction to double simultaneous stimulation. Romberg test negative Deep Tendon Reflexes: +2 throughout except for absent ankle jerks, no ankle clonus Plantar responses: downgoing bilaterally Cerebellar: no incoordination on finger to nose testing Gait: slow and cautious, no ataxia, unable to tandem walk (similar to prior) Tremor: none  IMPRESSION: This is a 59 yo RH man with a history of ALL s/p stem cell transplant, chemotherapy, radiation, PE/DVT, GVD, presenting with new onset seizure last 01/20/14. MRI brain with and without contrast and routine EEG normal. We discussed again that after an initial seizure, unless there are significant risk factors, an abnormal neurological exam, an EEG showing epileptiform abnormalities, and/or abnormal neuroimaging, treatment with an antiepileptic drug is not indicated. The patient was instructed to call with recurrent events, as in that case treatment and further diagnostic workup may be indicated. We discussed 10% of the population may have a single seizure. Patients with a single unprovoked seizure have a recurrence rate of 33%  after a single seizure and 73% after a second seizure. We again discussed [place] driving restrictions which indicate a patient needs to free of seizures or events of altered awareness for 6 months prior to resuming driving. He will follow-up in 4 months.   Thank you for allowing me to participate in his care.  Please do not hesitate to call for any questions or concerns.  The duration of this appointment visit was 15  minutes of face-to-face time with the patient.  Greater than 50% of this time was spent in counseling, explanation of diagnosis, planning of further management, and coordination of care.   Ellouise Newer, M.D.   CC: Stevan Born

## 2014-03-11 NOTE — Patient Instructions (Signed)
1. If you have any change in symptoms or another seizure, call our office 2. As per [place] driving laws, after a seizure, one should not drive until 6 months seizure-free 3. Follow-up in 4 months

## 2014-03-14 ENCOUNTER — Encounter: Payer: Self-pay | Admitting: Neurology

## 2014-05-16 ENCOUNTER — Other Ambulatory Visit: Payer: Self-pay | Admitting: Family Medicine

## 2014-05-16 ENCOUNTER — Other Ambulatory Visit (INDEPENDENT_AMBULATORY_CARE_PROVIDER_SITE_OTHER): Payer: Medicare Other

## 2014-05-16 DIAGNOSIS — D539 Nutritional anemia, unspecified: Secondary | ICD-10-CM

## 2014-05-16 DIAGNOSIS — J439 Emphysema, unspecified: Secondary | ICD-10-CM

## 2014-05-16 DIAGNOSIS — E119 Type 2 diabetes mellitus without complications: Secondary | ICD-10-CM

## 2014-05-16 DIAGNOSIS — G894 Chronic pain syndrome: Secondary | ICD-10-CM

## 2014-05-16 NOTE — Progress Notes (Signed)
Lab only 

## 2014-05-17 LAB — ANEMIA PROFILE B
BASOS ABS: 0 10*3/uL (ref 0.0–0.2)
Basos: 1 %
EOS: 5 %
Eosinophils Absolute: 0.2 10*3/uL (ref 0.0–0.4)
FERRITIN: 980 ng/mL — AB (ref 30–400)
Folate: >20 ng/mL (ref >3.0)
HEMATOCRIT: 29.5 % — AB (ref 37.5–51.0)
Hemoglobin: 9.8 g/dL — ABNORMAL LOW (ref 12.6–17.7)
IRON SATURATION: 37 % (ref 15–55)
Immature Grans (Abs): 0 10*3/uL (ref 0.0–0.1)
Immature Granulocytes: 0 %
Iron: 80 ug/dL (ref 38–169)
LYMPHS ABS: 1.1 10*3/uL (ref 0.7–3.1)
Lymphs: 30 %
MCH: 35.9 pg — AB (ref 26.6–33.0)
MCHC: 33.2 g/dL (ref 31.5–35.7)
MCV: 108 fL — ABNORMAL HIGH (ref 79–97)
Monocytes Absolute: 0.3 10*3/uL (ref 0.1–0.9)
Monocytes: 8 %
Neutrophils Absolute: 2.1 10*3/uL (ref 1.4–7.0)
Neutrophils Relative %: 56 %
Platelets: 79 10*3/uL — CL (ref 150–379)
RBC: 2.73 x10E6/uL — CL (ref 4.14–5.80)
RDW: 15.1 % (ref 12.3–15.4)
Retic Ct Pct: 0.6 % (ref 0.6–2.6)
Total Iron Binding Capacity: 215 ug/dL — ABNORMAL LOW (ref 250–450)
UIBC: 135 ug/dL (ref 111–343)
VITAMIN B 12: 588 pg/mL (ref 211–946)
WBC: 3.7 10*3/uL (ref 3.4–10.8)

## 2014-05-17 LAB — NMR, LIPOPROFILE
Cholesterol: 184 mg/dL (ref 100–199)
HDL Cholesterol by NMR: 53 mg/dL (ref >39)
HDL Particle Number: 31.1 umol/L (ref >=30.5)
LDL Particle Number: 1262 nmol/L — ABNORMAL HIGH (ref <1000)
LDL Size: 21.2 nm (ref >20.5)
LDL-C: 106 mg/dL — AB (ref 0–99)
LP-IR SCORE: 40 (ref <=45)
Small LDL Particle Number: 525 nmol/L (ref <=527)
Triglycerides by NMR: 124 mg/dL (ref 0–149)

## 2014-05-17 LAB — VITAMIN D 25 HYDROXY (VIT D DEFICIENCY, FRACTURES): VIT D 25 HYDROXY: 33.4 ng/mL (ref 30.0–100.0)

## 2014-05-19 ENCOUNTER — Encounter: Payer: Self-pay | Admitting: Family Medicine

## 2014-05-19 ENCOUNTER — Ambulatory Visit (INDEPENDENT_AMBULATORY_CARE_PROVIDER_SITE_OTHER): Payer: Medicare Other | Admitting: Family Medicine

## 2014-05-19 VITALS — BP 107/63 | HR 79 | Temp 98.0°F | Ht 66.0 in | Wt 219.8 lb

## 2014-05-19 DIAGNOSIS — D62 Acute posthemorrhagic anemia: Secondary | ICD-10-CM | POA: Diagnosis not present

## 2014-05-19 DIAGNOSIS — Z1212 Encounter for screening for malignant neoplasm of rectum: Secondary | ICD-10-CM | POA: Diagnosis not present

## 2014-05-19 DIAGNOSIS — E119 Type 2 diabetes mellitus without complications: Secondary | ICD-10-CM | POA: Diagnosis not present

## 2014-05-19 LAB — POCT CBC
Granulocyte percent: 56.7 %G (ref 37–80)
HCT, POC: 31.2 % — AB (ref 43.5–53.7)
Hemoglobin: 9.6 g/dL — AB (ref 14.1–18.1)
LYMPH, POC: 1.3 (ref 0.6–3.4)
MCH, POC: 33.1 pg — AB (ref 27–31.2)
MCHC: 30.8 g/dL — AB (ref 31.8–35.4)
MCV: 107.3 fL — AB (ref 80–97)
MPV: 7 fL (ref 0–99.8)
POC GRANULOCYTE: 2.2 (ref 2–6.9)
POC LYMPH PERCENT: 34.2 %L (ref 10–50)
Platelet Count, POC: 88 10*3/uL — AB (ref 142–424)
RBC: 2.91 M/uL — AB (ref 4.69–6.13)
RDW, POC: 15.4 %
WBC: 3.8 10*3/uL — AB (ref 4.6–10.2)

## 2014-05-19 LAB — POCT GLYCOSYLATED HEMOGLOBIN (HGB A1C): Hemoglobin A1C: 5.2

## 2014-05-19 MED ORDER — PANTOPRAZOLE SODIUM 40 MG PO TBEC: 40.0000 mg | DELAYED_RELEASE_TABLET | Freq: Every day | ORAL | Status: DC

## 2014-05-19 NOTE — Progress Notes (Signed)
+   Subjective:  Patient ID: Derek Shepherd, male    DOB: January 03, 1956  Age: 59 y.o. MRN: [id number]  CC: Annual Exam    HPI BOLUWATIFE FLIGHT also presents for stool being black. First time was 1 week ago. Denies abd pain. Using lactose for constipation. Apparently he's had a recent colonoscopy that was negative. However he does have a history of a adenoma in the colon several years ago. He is also taking high doses of opiates due to chronic pain related to his treatments for leukemia and lymphoma.  History Cla[place]orne has a past medical history of Leukemia-lymphoma, T-cell, acute, HTLV-I-associated; COPD (chronic obstructive pulmonary disease); DVT (deep venous thrombosis); and Personal history of colonic  adenoma (01/22/2008).   He has past surgical history that includes Cholecystectomy; Lung surgery; Exploratory laparotomy; and Hernia repair.   His family history is not on file.He reports that he has quit smoking. He does not have any smokeless tobacco history on file. He reports that he does not drink alcohol or use illicit drugs.  Current Outpatient Prescriptions on File Prior to Visit  Medication Sig Dispense Refill  . albuterol (PROVENTIL HFA;VENTOLIN HFA) 108 (90 BASE) MCG/ACT inhaler Inhale 2 puffs into the lungs every 6 (six) hours as needed for wheezing or shortness of breath.    Marland Kitchen albuterol (PROVENTIL) (2.5 MG/3ML) 0.083% nebulizer solution Take 2.5 mg by nebulization every 6 (six) hours as needed for wheezing or shortness of breath.    . ALPRAZolam (XANAX) 0.25 MG tablet 3 (three) times daily as needed.     . citalopram (CELEXA) 20 MG tablet Take 20 mg by mouth daily.     . fentaNYL (DURAGESIC - DOSED MCG/HR) 25 MCG/HR patch Place 25 mcg onto the skin every other day.     . fluconazole (DIFLUCAN) 200 MG tablet Take 200 mg by mouth every other day.     . fluticasone (FLONASE) 50 MCG/ACT nasal spray Place 1 spray into the nose.    . folic acid (FOLVITE) 1 MG tablet Take 1 mg by mouth.     . furosemide (LASIX) 80 MG tablet 80 mg 2 (two) times daily.   1  . lactulose (CHRONULAC) 10 GM/15ML solution Take 10 g by mouth.    . Multiple Vitamins tablet Take 1 tablet by mouth.    . NUCYNTA 50 MG TABS tablet Take 50 mg by mouth every 6 (six) hours as needed.     . ONE TOUCH ULTRA TEST test strip USE ONE STRIP TO CHECK GLUCOSE ONCE DAILY IN THE MORNING FOR  FASTING  BLOOD  SUGAR 100 each 0  . Polyethyl Glycol-Propyl Glycol (SYSTANE PRESERVATIVE FREE) 0.4-0.3 % SOLN Place 1 drop into both eyes. 1 drop in each eye prn    . potassium chloride (MICRO-K) 10 MEQ CR capsule 20 mEq. Takes 2 capsules daily    . predniSONE (DELTASONE) 20 MG tablet Take 20 mg by mouth daily with breakfast.     . sulfamethoxazole-trimethoprim (BACTRIM DS,SEPTRA DS) 800-160 MG per tablet Take 1 tablet by mouth.    . SYMBICORT 160-4.5 MCG/ACT inhaler Inhale 2 puffs into the lungs every morning.   2  . tiotropium (SPIRIVA HANDIHALER) 18 MCG inhalation capsule Place 1 capsule into inhaler and inhale. Every AM    . vitamin B-12 (CYANOCOBALAMIN) 1000 MCG tablet Take 1,000 mcg by mouth.     No current facility-administered medications on file prior to visit.    ROS Review of Systems  Constitutional: Negative for  fever, chills, diaphoresis, activity change, appetite change, fatigue and unexpected weight change.  HENT: Negative for congestion, ear pain, hearing loss, postnasal drip, rhinorrhea, sore throat, tinnitus and trouble swallowing.   Eyes: Negative for photophobia, pain, discharge and redness.  Respiratory: Negative for apnea, cough, choking, chest tightness, shortness of breath, wheezing and stridor.   Cardiovascular: Negative for chest pain, palpitations and leg swelling.  Gastrointestinal: Negative for nausea, vomiting, abdominal pain, diarrhea, constipation, blood in stool and abdominal distention.  Endocrine: Negative for cold intolerance, heat intolerance, polydipsia, polyphagia and polyuria.    Genitourinary: Negative for dysuria, urgency, frequency, hematuria, flank pain, enuresis, difficulty urinating and genital sores.  Musculoskeletal: Negative for joint swelling and arthralgias.  Skin: Negative for color change, rash and wound.  Allergic/Immunologic: Negative for immunocompromised state.  Neurological: Negative for dizziness, tremors, seizures, syncope, facial asymmetry, speech difficulty, weakness, light-headedness, numbness and headaches.  Hematological: Does not bruise/bleed easily.  Psychiatric/Behavioral: Negative for suicidal ideas, hallucinations, behavioral problems, confusion, sleep disturbance, dysphoric mood, decreased concentration and agitation. The patient is not nervous/anxious and is not hyperactive.     Objective:  BP 107/63 mmHg  Pulse 79  Temp(Src) 98 F (36.7 C) (Oral)  Ht 5\' 6"  (1.676 m)  Wt 219 lb 12.8 oz (99.701 kg)  BMI 35.49 kg/m2  BP Readings from Last 3 Encounters:  05/19/14 107/63  03/11/14 126/62  01/29/14 122/64    Wt Readings from Last 3 Encounters:  05/19/14 219 lb 12.8 oz (99.701 kg)  03/11/14 219 lb (99.338 kg)  02/17/14 221 lb (100.245 kg)     Physical Exam  Constitutional: He is oriented to person, place, and time. He appears well-developed and well-nourished. No distress.  HENT:  Head: Normocephalic and atraumatic.  Right Ear: External ear normal.  Left Ear: External ear normal.  Nose: Nose normal.  Mouth/Throat: Oropharynx is clear and moist.  Eyes: Conjunctivae and EOM are normal. Pupils are equal, round, and reactive to light.  Neck: Normal range of motion. Neck supple. No tracheal deviation present. No thyromegaly present.  Cardiovascular: Normal rate, regular rhythm and normal heart sounds.  Exam reveals no gallop and no friction rub.   No murmur heard. Pulmonary/Chest: Effort normal and breath sounds normal. No respiratory distress. He has no wheezes. He has no rales.  Abdominal: Soft. Bowel sounds are normal. He  exhibits no distension and no mass. There is no tenderness.  Genitourinary: Rectum normal and prostate normal.  IFOB pending  Musculoskeletal: Normal range of motion. He exhibits no edema.  Lymphadenopathy:    He has no cervical adenopathy.  Neurological: He is alert and oriented to person, place, and time. He has normal reflexes.  Skin: Skin is warm and dry.  Psychiatric: He has a normal mood and affect. His behavior is normal. Judgment and thought content normal.    Lab Results  Component Value Date   HGBA1C 5.2% 05/19/2014   HGBA1C 5.4 10/31/2013   HGBA1C 5.6 04/22/2013    Lab Results  Component Value Date   WBC 3.7 05/16/2014   HGB 9.8* 05/16/2014   HCT 29.5* 05/16/2014   PLT 79* 05/16/2014   GLUCOSE 121* 01/22/2014   CHOL 184 05/16/2014   TRIG 124 05/16/2014   HDL 53 05/16/2014   LDLCALC 144* 10/31/2013   ALT 24 01/22/2014   AST 37 01/22/2014   NA 142 01/22/2014   K 4.2 01/22/2014   CL 94* 01/22/2014   CREATININE 0.75* 01/22/2014   BUN 12 01/22/2014   CO2 34* 01/22/2014  PSA 1.0 02/15/2013   INR 1.26 05/01/2013   HGBA1C 5.2% 05/19/2014    Mr Brain W Wo Contrast  02/12/2014   CLINICAL DATA:  Nocturnal seizure, initial episode. Also history of leukemia/lymphoma, T-cell, acute HTLV-1 associated.  EXAM: MRI HEAD WITHOUT AND WITH CONTRAST  TECHNIQUE: Multiplanar, multiecho pulse sequences of the brain and surrounding structures were obtained without and with intravenous contrast.  CONTRAST:  65mL MULTIHANCE GADOBENATE DIMEGLUMINE 529 MG/ML IV SOLN  COMPARISON:  CT head 05/01/2013.  FINDINGS: No evidence for acute infarction, hemorrhage, mass lesion, hydrocephalus, or extra-axial fluid. Slight cerebral and cerebellar atrophy. No white matter disease. Falx ossification. Flow voids are maintained throughout the carotid, basilar, and vertebral arteries. There are no areas of chronic hemorrhage. Marked empty sella with flattening of the gland and expansion of the bony confines.  Slight tonsillar ectopia without frank Chiari I malformation. Possible mild cervical spondylosis. Post infusion, no abnormal enhancement of the brain or meninges. Thin section coronal imaging is degraded by motion, but demonstrates no temporal lobe mass. Visualized calvarium, skull base, and upper cervical osseous structures unremarkable. Scalp and extracranial soft tissues, orbits, sinuses, and mastoids show no acute process. Similar appearance to priors.  IMPRESSION: Mild atrophy. No acute intracranial findings. No abnormal postcontrast enhancement.  Marked empty sella, of uncertain significance. Doubtful relationship to the patient's seizure.   Electronically Signed   By: Rolla Flatten M.D.   On: 02/12/2014 14:17    Assessment & Plan:   Osha was seen today for annual exam.  Diagnoses and all orders for this visit:  Diabetes mellitus type II, controlled Orders: -     POCT glycosylated hemoglobin (Hb A1C)  Acute blood loss anemia Orders: -     Ambulatory referral to Gastroenterology -     POCT CBC  Other orders -     pantoprazole (PROTONIX) 40 MG tablet; Take 1 tablet (40 mg total) by mouth daily. For stomach   I have discontinued Mr. Roulston's XARELTO. I am also having him start on pantoprazole. Additionally, I am having him maintain his ALPRAZolam, citalopram, fentaNYL, fluconazole, predniSONE, NUCYNTA, albuterol, albuterol, potassium chloride, fluticasone, lactulose, Multiple Vitamins, tiotropium, furosemide, SYMBICORT, Polyethyl Glycol-Propyl Glycol, sulfamethoxazole-trimethoprim, folic acid, vitamin Q-82, ONE TOUCH ULTRA TEST, and Sirolimus.  Meds ordered this encounter  Medications  . Sirolimus 0.5 MG TABS    Sig: Take 0.5 mg by mouth.  . pantoprazole (PROTONIX) 40 MG tablet    Sig: Take 1 tablet (40 mg total) by mouth daily. For stomach    Dispense:  30 tablet    Refill:  3   I do not believe that he has a true diagnosis of diabetes mellitus. He may have some illness related  elevated blood sugars but 3 hemoglobin A1c's in a row have been in the normal range over the last year. He does not take medication for diabetes nor follow a diabetic diet either.  Follow-up: Return in about 10 days (around 05/29/2014) for blood loss anemia.  Claretta Fraise, M.D.

## 2014-05-20 ENCOUNTER — Encounter: Payer: Self-pay | Admitting: Internal Medicine

## 2014-05-20 NOTE — Addendum Note (Signed)
Addended by: Earlene Plater on: 05/20/2014 08:29 AM   Modules accepted: Orders, SmartSet

## 2014-05-22 LAB — FECAL OCCULT BLOOD, IMMUNOCHEMICAL: Fecal Occult Bld: NEGATIVE

## 2014-05-23 ENCOUNTER — Other Ambulatory Visit: Payer: Self-pay | Admitting: Family Medicine

## 2014-05-24 ENCOUNTER — Other Ambulatory Visit: Payer: Self-pay | Admitting: *Deleted

## 2014-05-24 MED ORDER — ALBUTEROL SULFATE (2.5 MG/3ML) 0.083% IN NEBU: 2.5000 mg | INHALATION_SOLUTION | Freq: Four times a day (QID) | RESPIRATORY_TRACT | Status: DC | PRN

## 2014-05-28 ENCOUNTER — Encounter: Payer: Self-pay | Admitting: Family Medicine

## 2014-05-28 ENCOUNTER — Ambulatory Visit (INDEPENDENT_AMBULATORY_CARE_PROVIDER_SITE_OTHER): Payer: Medicare Other | Admitting: Family Medicine

## 2014-05-28 VITALS — BP 130/78 | HR 81 | Temp 97.1°F | Ht 66.0 in | Wt 218.2 lb

## 2014-05-28 DIAGNOSIS — G894 Chronic pain syndrome: Secondary | ICD-10-CM | POA: Diagnosis not present

## 2014-05-28 DIAGNOSIS — R710 Precipitous drop in hematocrit: Secondary | ICD-10-CM

## 2014-05-28 DIAGNOSIS — E119 Type 2 diabetes mellitus without complications: Secondary | ICD-10-CM | POA: Diagnosis not present

## 2014-05-28 DIAGNOSIS — D649 Anemia, unspecified: Secondary | ICD-10-CM | POA: Diagnosis not present

## 2014-05-28 DIAGNOSIS — C9101 Acute lymphoblastic leukemia, in remission: Secondary | ICD-10-CM | POA: Diagnosis not present

## 2014-05-28 LAB — POCT CBC
Granulocyte percent: 62.2 %G (ref 37–80)
HEMATOCRIT: 31.4 % — AB (ref 43.5–53.7)
Hemoglobin: 9.8 g/dL — AB (ref 14.1–18.1)
Lymph, poc: 1.2 (ref 0.6–3.4)
MCH: 33.7 pg — AB (ref 27–31.2)
MCHC: 31.4 g/dL — AB (ref 31.8–35.4)
MCV: 107.6 fL — AB (ref 80–97)
MPV: 6.6 fL (ref 0–99.8)
POC GRANULOCYTE: 2.6 (ref 2–6.9)
POC LYMPH PERCENT: 27.4 %L (ref 10–50)
Platelet Count, POC: 90 10*3/uL — AB (ref 142–424)
RBC: 2.91 M/uL — AB (ref 4.69–6.13)
RDW, POC: 14.4 %
WBC: 4.2 10*3/uL — AB (ref 4.6–10.2)

## 2014-05-28 NOTE — Progress Notes (Addendum)
Subjective:  Patient ID: Derek Shepherd, male    DOB: [birth year]  Age: 59 y.o. MRN: [id number]  CC: No chief complaint on file.   HPI Derek Shepherd presents for *recurrent anemia based on his history of leukemia. His hemoglobin has been dropping significantly in the past and it is time for that. Recheck. Related to anemia he has not suffered a lack of energy recently. He is not noted being excessively pale. There has been no melena no hematochezia. There is concern that his bone marrow simply does not produce adequate reticulocytes.  Patient in for follow-up of GERD. Currently asymptomatic taking  PPI daily. There is no chest pain or heartburn. No hematemesis and no melena. No dysphagia or choking. Onset is remote. Progression is stable. Complicating factors, none.   Patient seen for chronic pain at pain clinic and by oncology. His diabetes has been exceedingly stable in that one week ago his A1c was 5.2.  History Derek Shepherd has a past medical history of Leukemia-lymphoma, T-cell, acute, HTLV-I-associated; COPD (chronic obstructive pulmonary disease); DVT (deep venous thrombosis); and Personal history of colonic  adenoma (01/22/2008).   He has past surgical history that includes Cholecystectomy; Lung surgery; Exploratory laparotomy; and Hernia repair.   His family history is not on file.He reports that he has quit smoking. He does not have any smokeless tobacco history on file. He reports that he does not drink alcohol or use illicit drugs.  Current Outpatient Prescriptions on File Prior to Visit  Medication Sig Dispense Refill  . albuterol (PROVENTIL) (2.5 MG/3ML) 0.083% nebulizer solution Take 3 mLs (2.5 mg total) by nebulization every 6 (six) hours as needed for wheezing or shortness of breath. 75 mL 4  . ALPRAZolam (XANAX) 0.25 MG tablet 3 (three) times daily as needed.     . citalopram (CELEXA) 20 MG tablet Take 20 mg by mouth daily.     . fentaNYL (DURAGESIC - DOSED MCG/HR) 25  MCG/HR patch Place 25 mcg onto the skin every other day.     . fluconazole (DIFLUCAN) 200 MG tablet Take 200 mg by mouth every other day.     . fluticasone (FLONASE) 50 MCG/ACT nasal spray Place 1 spray into the nose.    . folic acid (FOLVITE) 1 MG tablet Take 1 mg by mouth.    . furosemide (LASIX) 80 MG tablet 80 mg 2 (two) times daily.   1  . lactulose (CHRONULAC) 10 GM/15ML solution Take 10 g by mouth.    . Multiple Vitamins tablet Take 1 tablet by mouth.    . NUCYNTA 50 MG TABS tablet Take 50 mg by mouth every 6 (six) hours as needed.     . ONE TOUCH ULTRA TEST test strip USE ONE STRIP TO CHECK GLUCOSE ONCE DAILY IN THE MORNING FOR  FASTING  BLOOD  SUGAR 100 each 0  . pantoprazole (PROTONIX) 40 MG tablet Take 1 tablet (40 mg total) by mouth daily. For stomach 30 tablet 3  . Polyethyl Glycol-Propyl Glycol (SYSTANE PRESERVATIVE FREE) 0.4-0.3 % SOLN Place 1 drop into both eyes. 1 drop in each eye prn    . predniSONE (DELTASONE) 20 MG tablet Take 20 mg by mouth daily with breakfast.     . promethazine (PHENERGAN) 25 MG tablet TAKE ONE TABLET BY MOUTH 4 TIMES DAILY AS NEEDED FOR NAUSEA 30 tablet 2  . Sirolimus 0.5 MG TABS Take 0.5 mg by mouth.    . sulfamethoxazole-trimethoprim (BACTRIM DS,SEPTRA DS) 800-160 MG per tablet Take  1 tablet by mouth.    . SYMBICORT 160-4.5 MCG/ACT inhaler Inhale 2 puffs into the lungs every morning.   2  . vitamin B-12 (CYANOCOBALAMIN) 1000 MCG tablet Take 1,000 mcg by mouth.    . potassium chloride (MICRO-K) 10 MEQ CR capsule 20 mEq. Takes 2 capsules daily    . tiotropium (SPIRIVA HANDIHALER) 18 MCG inhalation capsule Place 1 capsule into inhaler and inhale. Every AM     No current facility-administered medications on file prior to visit.    ROS Review of Systems  Constitutional: Negative for fever, chills, diaphoresis and unexpected weight change.  HENT: Negative for congestion, hearing loss, rhinorrhea, sore throat and trouble swallowing.   Respiratory:  Negative for cough, chest tightness, shortness of breath and wheezing.   Gastrointestinal: Negative for nausea, vomiting, abdominal pain, diarrhea, constipation and abdominal distention.  Endocrine: Negative for cold intolerance and heat intolerance.  Genitourinary: Negative for dysuria, hematuria and flank pain.  Musculoskeletal: Negative for joint swelling and arthralgias.  Skin: Negative for rash.  Neurological: Negative for dizziness and headaches.  Psychiatric/Behavioral: Negative for dysphoric mood, decreased concentration and agitation. The patient is not nervous/anxious.     Objective:  BP 130/78 mmHg  Pulse 81  Temp(Src) 97.1 F (36.2 C) (Oral)  Ht 5\' 6"  (1.676 m)  Wt 218 lb 3.2 oz (98.975 kg)  BMI 35.24 kg/m2  BP Readings from Last 3 Encounters:  05/28/14 130/78  05/19/14 107/63  03/11/14 126/62    Wt Readings from Last 3 Encounters:  05/28/14 218 lb 3.2 oz (98.975 kg)  05/19/14 219 lb 12.8 oz (99.701 kg)  03/11/14 219 lb (99.338 kg)     Physical Exam  Constitutional: He is oriented to person, place, and time. He appears well-developed and well-nourished. No distress.  HENT:  Head: Normocephalic and atraumatic.  Right Ear: External ear normal.  Left Ear: External ear normal.  Nose: Nose normal.  Mouth/Throat: Oropharynx is clear and moist.  Eyes: Conjunctivae and EOM are normal. Pupils are equal, round, and reactive to light.  Neck: Normal range of motion. Neck supple. No thyromegaly present.  Cardiovascular: Normal rate, regular rhythm and normal heart sounds.   No murmur heard. Pulmonary/Chest: Effort normal and breath sounds normal. No respiratory distress. He has no wheezes. He has no rales.  Abdominal: Soft. Bowel sounds are normal. He exhibits no distension. There is no tenderness.  Lymphadenopathy:    He has no cervical adenopathy.  Neurological: He is alert and oriented to person, place, and time. He has normal reflexes.  Skin: Skin is warm and dry.    Psychiatric: He has a normal mood and affect. His behavior is normal. Judgment and thought content normal.    Lab Results  Component Value Date   HGBA1C 5.2% 05/19/2014   HGBA1C 5.4 10/31/2013   HGBA1C 5.6 04/22/2013    Lab Results  Component Value Date   WBC 4.2* 05/28/2014   HGB 9.8* 05/28/2014   HCT 31.4* 05/28/2014   PLT 79* 05/16/2014   GLUCOSE 121* 01/22/2014   CHOL 184 05/16/2014   TRIG 124 05/16/2014   HDL 53 05/16/2014   LDLCALC 144* 10/31/2013   ALT 24 01/22/2014   AST 37 01/22/2014   NA 142 01/22/2014   K 4.2 01/22/2014   CL 94* 01/22/2014   CREATININE 0.75* 01/22/2014   BUN 12 01/22/2014   CO2 34* 01/22/2014   PSA 1.0 02/15/2013   INR 1.26 05/01/2013   HGBA1C 5.2% 05/19/2014    Mr Brain  W Wo Contrast  02/12/2014   CLINICAL DATA:  Nocturnal seizure, initial episode. Also history of leukemia/lymphoma, T-cell, acute HTLV-1 associated.  EXAM: MRI HEAD WITHOUT AND WITH CONTRAST  TECHNIQUE: Multiplanar, multiecho pulse sequences of the brain and surrounding structures were obtained without and with intravenous contrast.  CONTRAST:  41mL MULTIHANCE GADOBENATE DIMEGLUMINE 529 MG/ML IV SOLN  COMPARISON:  CT head 05/01/2013.  FINDINGS: No evidence for acute infarction, hemorrhage, mass lesion, hydrocephalus, or extra-axial fluid. Slight cerebral and cerebellar atrophy. No white matter disease. Falx ossification. Flow voids are maintained throughout the carotid, basilar, and vertebral arteries. There are no areas of chronic hemorrhage. Marked empty sella with flattening of the gland and expansion of the bony confines. Slight tonsillar ectopia without frank Chiari I malformation. Possible mild cervical spondylosis. Post infusion, no abnormal enhancement of the brain or meninges. Thin section coronal imaging is degraded by motion, but demonstrates no temporal lobe mass. Visualized calvarium, skull base, and upper cervical osseous structures unremarkable. Scalp and extracranial  soft tissues, orbits, sinuses, and mastoids show no acute process. Similar appearance to priors.  IMPRESSION: Mild atrophy. No acute intracranial findings. No abnormal postcontrast enhancement.  Marked empty sella, of uncertain significance. Doubtful relationship to the patient's seizure.   Electronically Signed   By: Rolla Flatten M.D.   On: 02/12/2014 14:17    Assessment & Plan:   Diagnoses and all orders for this visit:  Decreased hemoglobin Orders: -     POCT CBC  Diabetes mellitus type II, controlled  ALL (acute lymphoid leukemia) in remission  Chronic pain syndrome   I am having Mr. Gater maintain his ALPRAZolam, citalopram, fentaNYL, fluconazole, predniSONE, NUCYNTA, potassium chloride, fluticasone, lactulose, Multiple Vitamins, tiotropium, furosemide, SYMBICORT, Polyethyl Glycol-Propyl Glycol, sulfamethoxazole-trimethoprim, folic acid, vitamin F-74, ONE TOUCH ULTRA TEST, Sirolimus, pantoprazole, promethazine, and albuterol.  No orders of the defined types were placed in this encounter.     Follow-up: Return in about 3 months (around 08/28/2014).  Claretta Fraise, M.D.

## 2014-06-27 ENCOUNTER — Encounter: Payer: Self-pay | Admitting: Internal Medicine

## 2014-07-10 ENCOUNTER — Ambulatory Visit: Payer: Medicare Other | Admitting: Internal Medicine

## 2014-07-10 ENCOUNTER — Encounter: Payer: Self-pay | Admitting: Neurology

## 2014-07-10 ENCOUNTER — Ambulatory Visit (INDEPENDENT_AMBULATORY_CARE_PROVIDER_SITE_OTHER): Payer: Medicare Other | Admitting: Neurology

## 2014-07-10 VITALS — BP 114/60 | HR 74 | Ht 66.0 in | Wt 221.2 lb

## 2014-07-10 DIAGNOSIS — R569 Unspecified convulsions: Secondary | ICD-10-CM

## 2014-07-10 NOTE — Patient Instructions (Signed)
1. Continue all your medications 2. Follow-up in 6 months, call our office for any changes  Seizure Precautions: 1. If medication has been prescribed for you to prevent seizures, take it exactly as directed.  Do not stop taking the medicine without talking to your doctor first, even if you have not had a seizure in a long time.   2. Avoid activities in which a seizure would cause danger to yourself or to others.  Don't operate dangerous machinery, swim alone, or climb in high or dangerous places, such as on ladders, roofs, or girders.  Do not drive unless your doctor says you may.  3. If you have any warning that you may have a seizure, lay down in a safe place where you can't hurt yourself.    4.  No driving for 6 months from last seizure, as per [facility].   Please refer to the following link on the [facility] website for more information: [email or web address]   5.  Maintain good sleep hygiene.  6.  Contact your doctor if you have any problems that may be related to the medicine you are taking.  7.  Call 911 and bring the patient back to the ED if:        A.  The seizure lasts longer than 5 minutes.       B.  The patient doesn't awaken shortly after the seizure  C.  The patient has new problems such as difficulty seeing, speaking or moving  D.  The patient was injured during the seizure  E.  The patient has a temperature over 102 F (39C)  F.  The patient vomited and now is having trouble breathing

## 2014-07-10 NOTE — Progress Notes (Signed)
NEUROLOGY FOLLOW UP OFFICE NOTE  GREY RAKESTRAW [id number]  HISTORY OF PRESENT ILLNESS: I had the pleasure of seeing Derek Shepherd in follow-up in the neurology clinic on 07/10/2014.  The patient was last seen 4 months ago for new onset seizure on 01/20/14. He is again accompanied by his wife who helps supplement the history today. They deny any further seizures or seizure-like symptoms. He denies any dizziness, focal numbness/tingling/weakness. His wife again continues to notice a little confusion, but no unresponsiveness. He has occasional head pressure when he wears his hat or leans down. No associated nausea, vomiting, photo/phonophobia.  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 He was in his usual state of health and recalls being stressed that day, went to bed, then woke up the next morning being told he had a seizure. His wife woke up at 11:30pm to the bed shaking, all his extremities were extended and stiff with shaking lasting 3 minutes. He was unresponsive after, then said "what" to his wife, then went back to sleep. There was no tongue bite, urinary incontinence. He denies any recent illnesses, alcohol intake, or sleep deprivation. He went to his PCP 2 days later, CBC showed WBC 3.8, Hct 30.6, platelets 122; CMP unremarkable.   They deny any episodes of staring/unresponsiveness, gaps in time, olfactory/gustatory hallucinations, focal numbness/tingling/weakness. His wife reports that he frequently "messes with his clothes or bedsheets with both hands, no dystonic posturing or unresponsiveness. His hands would swell up at night and he would be unable to flex his fingers. They report visual hallucinations more over the past few months, for instance he  would be watching TV and see a little boy in the room. He would think he and his brother were working on something. His wife reports he would get confused and talk nonsense. They have noted occasional body jerking in wakefulness and sleep. He has occasional tingling in both hands. He has a history of left foot numbness since a gluteal hematoma in 2011. He has tinnitus in both ears. He has a history of left Bell's palsy in the 1980s with residual synkinesis.   He had a normal birth and early development. There is no history of febrile convulsions, CNS infections such as meningitis/encephalitis, significant traumatic brain injury, neurosurgical procedures, or family history of seizures.  Diagnostic Data: I personally reviewed MRI brain with and without contrast which did not show any acute changes, no abnormal signal or enhancement seen. His routine EEG was normal overall, however the left temporal derivations were obscured by artifact.   PAST MEDICAL HISTORY: Past Medical History  Diagnosis Date  . Leukemia-lymphoma, T-cell, acute, HTLV-I-associated   . COPD (chronic obstructive pulmonary disease)   . DVT (deep venous thrombosis)   . Personal history of colonic  adenoma 01/22/2008    MEDICATIONS: Current Outpatient Prescriptions on File Prior to Visit  Medication Sig Dispense Refill  . albuterol (PROVENTIL) (2.5 MG/3ML) 0.083% nebulizer solution Take 3 mLs (2.5 mg total) by nebulization every 6 (six) hours as needed for wheezing or shortness of breath. 75 mL 4  . ALPRAZolam (XANAX) 0.25 MG tablet 3 (three) times daily as needed.     . citalopram (CELEXA) 20 MG tablet Take 20 mg by mouth daily.     . fentaNYL (DURAGESIC - DOSED MCG/HR) 25  MCG/HR patch Place 25 mcg onto the skin every other day.     . fluticasone (FLONASE) 50 MCG/ACT nasal spray Place 1 spray into the nose.    . folic acid (FOLVITE) 1 MG tablet Take 1 mg by mouth.    . furosemide (LASIX) 80 MG tablet 80 mg 2 (two) times daily.    1  . lactulose (CHRONULAC) 10 GM/15ML solution Take 10 g by mouth.    . Multiple Vitamins tablet Take 1 tablet by mouth.    . NUCYNTA 50 MG TABS tablet Take 50 mg by mouth every 6 (six) hours as needed.     . ONE TOUCH ULTRA TEST test strip USE ONE STRIP TO CHECK GLUCOSE ONCE DAILY IN THE MORNING FOR  FASTING  BLOOD  SUGAR 100 each 0  . Polyethyl Glycol-Propyl Glycol (SYSTANE PRESERVATIVE FREE) 0.4-0.3 % SOLN Place 1 drop into both eyes. 1 drop in each eye prn    . predniSONE (DELTASONE) 20 MG tablet Take 20 mg by mouth daily with breakfast.     . promethazine (PHENERGAN) 25 MG tablet TAKE ONE TABLET BY MOUTH 4 TIMES DAILY AS NEEDED FOR NAUSEA 30 tablet 2  . Sirolimus 0.5 MG TABS Take 0.5 mg by mouth.    . sulfamethoxazole-trimethoprim (BACTRIM DS,SEPTRA DS) 800-160 MG per tablet Take 1 tablet by mouth.    . SYMBICORT 160-4.5 MCG/ACT inhaler Inhale 2 puffs into the lungs every morning.   2  . vitamin B-12 (CYANOCOBALAMIN) 1000 MCG tablet Take 1,000 mcg by mouth.    . potassium chloride (MICRO-K) 10 MEQ CR capsule 20 mEq. Takes 2 capsules daily    . tiotropium (SPIRIVA HANDIHALER) 18 MCG inhalation capsule Place 1 capsule into inhaler and inhale. Every AM     No current facility-administered medications on file prior to visit.    ALLERGIES: Allergies  Allergen Reactions  . Nsaids Shortness Of Breath    FAMILY HISTORY: No family history on file.  SOCIAL HISTORY: History   Social History  . Marital Status: Married    Spouse Name: N/A  . Number of Children: N/A  . Years of Education: N/A   Occupational History  . Not on file.   Social History Main Topics  . Smoking status: Former Research scientist (life sciences)  . Smokeless tobacco: Not on file  . Alcohol Use: No  . Drug Use: No  . Sexual Activity: Not on file   Other Topics Concern  . Not on file   Social History Narrative    REVIEW OF SYSTEMS: Constitutional: No fevers, chills, or sweats, no generalized fatigue, change in appetite Eyes:  No visual changes, double vision, eye pain Ear, nose and throat: No hearing loss, ear pain, nasal congestion, sore throat Cardiovascular: No chest pain, palpitations Respiratory:  No shortness of breath at rest or with exertion, wheezes GastrointestinaI: No nausea, vomiting, diarrhea, abdominal pain, fecal incontinence Genitourinary:  No dysuria, urinary retention or frequency Musculoskeletal:  No neck pain, back pain Integumentary: No rash, pruritus, skin lesions Neurological: as above Psychiatric: No depression, insomnia, anxiety Endocrine: No palpitations, fatigue, diaphoresis, mood swings, change in appetite, change in weight, increased thirst Hematologic/Lymphatic:  No anemia, purpura, petechiae. Allergic/Immunologic: no itchy/runny eyes, nasal congestion, recent allergic reactions, rashes  PHYSICAL EXAM: Filed Vitals:   07/10/14 1105  BP: 114/60  Pulse: 74   General: No acute distress, on O2 nasal cannula Head: Normocephalic/atraumatic Eyes: Fundoscopic exam shows bilateral sharp discs, no vessel changes, exudates, or hemorrhages Neck: supple, no paraspinal tenderness, full  range of motion Back: No paraspinal tenderness Heart: regular rate and rhythm Lungs: Clear to auscultation bilaterally. Vascular: No carotid bruits. Skin/Extremities: No rash, no edema Neurological Exam: Mental status: alert and oriented to person, place, time. no dysarthria or aphasia, Fund of knowledge is appropriate. Recent and remote memory are intact. 2/3 delayed recall. Attention and concentration are normal. Able to name objects and repeat phrases. Cranial nerves: CN I: not tested CN II: pupils equal, round and reactive to light, visual fields intact, fundi unremarkable. CN III, IV, VI: full range of motion, no nystagmus, no ptosis CN V: facial sensation intact CN VII: upper and lower face symmetric, +synkinesis on left cheek and chin (similar to prior) CN VIII: hearing intact to finger  rub CN IX, X: gag intact, uvula midline CN XI: sternocleidomastoid and trapezius muscles intact CN XII: tongue midline Bulk & Tone: normal, no fasciculations. Motor: 5/5 throughout with no pronator drift. Sensation: intact to light touch. No extinction to double simultaneous stimulation. Romberg test negative Deep Tendon Reflexes: +2 throughout except for absent ankle jerks, no ankle clonus Plantar responses: downgoing bilaterally Cerebellar: no incoordination on finger to nose testing Gait: slow and cautious, no ataxia, unable to tandem walk (similar to prior) Tremor: none  IMPRESSION: This is a 59 yo RH man with a history of ALL s/p stem cell transplant, chemotherapy, radiation, PE/DVT, GVD, with new onset seizure last 01/20/14. MRI brain with and without contrast and routine EEG normal. No further similar symptoms since November. We again discussed again that after an initial seizure, unless there are significant risk factors, an abnormal neurological exam, an EEG showing epileptiform abnormalities, and/or abnormal neuroimaging, treatment with an antiepileptic drug is not indicated. The patient was instructed to call with recurrent events, as in that case treatment and further diagnostic workup may be indicated. He is aware of Early driving restrictions which indicate a patient needs to free of seizures or events of altered awareness for 6 months prior to resuming driving. He will follow-up in 6 months and knows to call our office for any changes.   Thank you for allowing me to participate in his care.  Please do not hesitate to call for any questions or concerns.  The duration of this appointment visit was 15 minutes of face-to-face time with the patient.  Greater than 50% of this time was spent in counseling, explanation of diagnosis, planning of further management, and coordination of care.   Ellouise Newer, M.D.

## 2014-07-14 ENCOUNTER — Other Ambulatory Visit (INDEPENDENT_AMBULATORY_CARE_PROVIDER_SITE_OTHER): Payer: Medicare Other

## 2014-07-14 ENCOUNTER — Encounter: Payer: Self-pay | Admitting: Physician Assistant

## 2014-07-14 ENCOUNTER — Ambulatory Visit (INDEPENDENT_AMBULATORY_CARE_PROVIDER_SITE_OTHER): Payer: Medicare Other | Admitting: Physician Assistant

## 2014-07-14 VITALS — BP 100/62 | HR 80 | Ht 66.0 in | Wt 220.0 lb

## 2014-07-14 DIAGNOSIS — I255 Ischemic cardiomyopathy: Secondary | ICD-10-CM

## 2014-07-14 DIAGNOSIS — Z86718 Personal history of other venous thrombosis and embolism: Secondary | ICD-10-CM | POA: Diagnosis not present

## 2014-07-14 DIAGNOSIS — R195 Other fecal abnormalities: Secondary | ICD-10-CM

## 2014-07-14 DIAGNOSIS — J441 Chronic obstructive pulmonary disease with (acute) exacerbation: Secondary | ICD-10-CM

## 2014-07-14 DIAGNOSIS — Z8719 Personal history of other diseases of the digestive system: Secondary | ICD-10-CM

## 2014-07-14 LAB — CBC WITH DIFFERENTIAL/PLATELET
BASOS PCT: 0.2 % (ref 0.0–3.0)
Basophils Absolute: 0 10*3/uL (ref 0.0–0.1)
EOS PCT: 2 % (ref 0.0–5.0)
Eosinophils Absolute: 0.1 10*3/uL (ref 0.0–0.7)
HEMATOCRIT: 29.2 % — AB (ref 39.0–52.0)
HEMOGLOBIN: 9.9 g/dL — AB (ref 13.0–17.0)
LYMPHS ABS: 0.9 10*3/uL (ref 0.7–4.0)
Lymphocytes Relative: 19.4 % (ref 12.0–46.0)
MCHC: 34 g/dL (ref 30.0–36.0)
MCV: 102.7 fl — ABNORMAL HIGH (ref 78.0–100.0)
Monocytes Absolute: 0.5 10*3/uL (ref 0.1–1.0)
Monocytes Relative: 9.5 % (ref 3.0–12.0)
NEUTROS ABS: 3.3 10*3/uL (ref 1.4–7.7)
Neutrophils Relative %: 68.9 % (ref 43.0–77.0)
PLATELETS: 102 10*3/uL — AB (ref 150.0–400.0)
RBC: 2.84 Mil/uL — ABNORMAL LOW (ref 4.22–5.81)
RDW: 15.2 % (ref 11.5–15.5)
WBC: 4.8 10*3/uL (ref 4.0–10.5)

## 2014-07-14 LAB — BASIC METABOLIC PANEL
BUN: 15 mg/dL (ref 6–23)
CO2: 40 mEq/L — ABNORMAL HIGH (ref 19–32)
Calcium: 9.6 mg/dL (ref 8.4–10.5)
Chloride: 96 mEq/L (ref 96–112)
Creatinine, Ser: 0.72 mg/dL (ref 0.40–1.50)
GFR: 119.77 mL/min (ref >60.00)
Glucose, Bld: 105 mg/dL — ABNORMAL HIGH (ref 70–99)
Potassium: 3.9 mEq/L (ref 3.5–5.1)
Sodium: 139 mEq/L (ref 135–145)

## 2014-07-14 LAB — PROTIME-INR
INR: 1.1 ratio — ABNORMAL HIGH (ref 0.8–1.0)
Prothrombin Time: 11.7 s (ref 9.6–13.1)

## 2014-07-14 MED ORDER — PANTOPRAZOLE SODIUM 40 MG PO TBEC: 40.0000 mg | DELAYED_RELEASE_TABLET | Freq: Two times a day (BID) | ORAL | Status: DC

## 2014-07-14 MED ORDER — PANTOPRAZOLE SODIUM 40 MG PO TBEC: 40.0000 mg | DELAYED_RELEASE_TABLET | Freq: Every day | ORAL | Status: DC

## 2014-07-14 NOTE — Progress Notes (Signed)
Agree with Ms. Genia Harold assessment and plan. Gatha Mayer, MD, [facility]   Not a candidate for sedated endoscopy Upper GI series is reasonable if needed Hgb is stable so bleeding must have been minimal  Another option could be unsedated EGD with a pediatric gastroscope if signs/sxs indicate  At this point given stable CBC - would opt for observation and study if recurrent melena

## 2014-07-14 NOTE — Patient Instructions (Signed)
We will call you with the lab results.  We called in two separate prescriptions for pantoprazole sodium 40 mg. 1. Pantoprazole sodium 40 mg , take 1 tab twice daily for 1 month. 2. Pantoprazole sodium 40 mg, take 1 tab once daily. Asked the pharmacist to keep this prescription on hold until you need it.

## 2014-07-14 NOTE — Progress Notes (Signed)
Patient ID: Derek Shepherd, male   DOB: [birth year], 57 y.o.   MRN: [id number]   Subjective:    Patient ID: Derek Shepherd, male    DOB: [birth year], 59 y.o.   MRN: [id number]  HPI Derek Shepherd is a 60 year old white male referred today by [facility] rocking him family practice doctor Cletus Gash stacks for evaluation of recent melena. Patient is known to Dr. Carlean Purl from colonoscopy done in 2009 4 colon cancer screening. He was noted to have minimal sigmoid diverticulosis and a 2 mm cecal polyp which was a tubular adenoma. Patient had been on long-term anticoagulation for history of recurrent DVT and pulmonary embolus but says this was stopped about 5 or 6 weeks ago due to complaints of melena. He has had multiple serious medical problems over the past few years. 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 He is been followed primarily at [facility]. He also had a seizure in November 2015 and has not had any recurrences since that time. Patient says he has had intermittent black stool over the past month or so. He says his stool stayed black for several days then seemed to clear and then became black again last week. He has not noted any change in his bowel habits no abdominal pain other than discomfort with constipation. His appetite has been fair he has no complaints of nausea heartburn etc. but has had a lot of gas. He is not on any regular aspirin or NSAIDs. He uses lactulose daily for constipation related to his pain medications. Patient had labs done last on 05/28/2014 hemoglobin 9.8 hematocrit of 31.4 MCV of 107 weight with 66 iron was 80 TIBC 135 and iron saturation of 37 by mouth) 12 creatinine 0.75 LFTs normal  Review of Systems  Pertinent positive and negative review of systems were noted in the above HPI  section.  All other review of systems was otherwise negative.  Outpatient Encounter Prescriptions as of 07/14/2014  Medication Sig  . albuterol (PROVENTIL) (2.5 MG/3ML) 0.083% nebulizer solution Take 3 mLs (2.5 mg total) by nebulization every 6 (six) hours as needed for wheezing or shortness of breath.  . ALPRAZolam (XANAX) 0.25 MG tablet 3 (three) times daily as needed.   . citalopram (CELEXA) 20 MG tablet Take 20 mg by mouth daily.   . fentaNYL (DURAGESIC - DOSED MCG/HR) 25 MCG/HR patch Place 25 mcg onto the skin every other day.   . fluticasone (FLONASE) 50 MCG/ACT nasal spray Place 1 spray into the nose.  . folic acid (FOLVITE) 1 MG tablet Take 1 mg by mouth.  . furosemide (LASIX) 80 MG tablet 80 mg 2 (two) times daily.   Derrill Memo ON 10/05/2014] HYDROcodone-acetaminophen (NORCO/VICODIN) 5-325 MG per tablet Take 1 tablet by mouth.  . lactulose (CHRONULAC) 10 GM/15ML solution Take 10 g by mouth.  . Magnesium Chloride-Calcium 64-106 MG TBEC Take 2 tablets by mouth.  . montelukast (SINGULAIR) 10 MG tablet Take 10 mg by mouth.  . Multiple Vitamins tablet Take 1 tablet by mouth.  . Naloxone HCl 0.4 MG/0.4ML SOAJ 0.4 mg.  . NUCYNTA 50 MG TABS tablet Take 50 mg by mouth every 6 (six) hours as needed.   . ONE TOUCH ULTRA TEST test strip USE ONE STRIP TO CHECK GLUCOSE ONCE DAILY IN THE MORNING  FOR  FASTING  BLOOD  SUGAR  . OXYGEN Inhale into the lungs. Continuous 5 LPM when up and about 3 LPM constant and at night  . Polyethyl Glycol-Propyl Glycol (SYSTANE PRESERVATIVE FREE) 0.4-0.3 % SOLN Place 1 drop into both eyes. 1 drop in each eye prn  . predniSONE (DELTASONE) 20 MG tablet Take 20 mg by mouth daily with breakfast.   . promethazine (PHENERGAN) 25 MG tablet TAKE ONE TABLET BY MOUTH 4 TIMES DAILY AS NEEDED FOR NAUSEA  . Sirolimus 0.5 MG TABS Take 0.5 mg by mouth.  . SPIRIVA HANDIHALER 18 MCG inhalation capsule   . sulfamethoxazole-trimethoprim (BACTRIM DS,SEPTRA DS) 800-160 MG per tablet Take 1  tablet by mouth.  . SYMBICORT 160-4.5 MCG/ACT inhaler Inhale 2 puffs into the lungs every morning.   . vitamin B-12 (CYANOCOBALAMIN) 1000 MCG tablet Take 1,000 mcg by mouth.  . pantoprazole (PROTONIX) 40 MG tablet Take 1 tablet (40 mg total) by mouth 2 (two) times daily.  . pantoprazole (PROTONIX) 40 MG tablet Take 1 tablet (40 mg total) by mouth daily.  . potassium chloride (MICRO-K) 10 MEQ CR capsule 20 mEq. Takes 2 capsules daily  . tiotropium (SPIRIVA HANDIHALER) 18 MCG inhalation capsule Place 1 capsule into inhaler and inhale. Every AM   No facility-administered encounter medications on file as of 07/14/2014.   Allergies  Allergen Reactions  . Nsaids Shortness Of Breath   Patient Active Problem List   Diagnosis Date Noted  . CHF (congestive heart failure) 07/14/2014  . New onset seizure 07/10/2014  . ALL (acute lymphoid leukemia) in remission 03/20/2013  . COPD (chronic obstructive pulmonary disease) 03/20/2013  . DDD (degenerative disc disease), lumbar 03/20/2013  . Diabetes 03/20/2013  . DVT (deep venous thrombosis) 03/20/2013  . Chronic pain syndrome 03/20/2013  . Personal history of colonic  adenoma 01/22/2008   History   Social History  . Marital Status: Married    Spouse Name: N/A  . Number of Children: 2  . Years of Education: N/A   Occupational History  . disabled    Social History Main Topics  . Smoking status: Former Smoker    Types: Cigarettes    Quit date: 07/13/1993  . Smokeless tobacco: Never Used  . Alcohol Use: No  . Drug Use: No  . Sexual Activity: Not on file   Other Topics Concern  . Not on file   Social History Narrative    Mr. Kovar's family history includes Clotting disorder in his mother; Diabetes in his brother, father, maternal aunt, and sister; Heart attack in his father and mother; Prostate cancer in his brother.      Objective:    Filed Vitals:   07/14/14 0921  BP: 100/62  Pulse: 80    Physical Exam  well-developed  white male in no acute distress he is on continuous oxygen, appears chronically ill accompanied by his wife blood pressure 100/62 pulse 80 height 5 foot 6 weight 220 HEENT ;ontraumatic normocephalic EOMI PERRLA sclera anicteric, Supple ;no JVD, Cardiovascular; regular rate and rhythm with J6-R6 soft systolic murmur, Pulmonary; significantly decreased breath sounds bilaterally, Abdomen; large soft she has a large midline incisional scar, no guarding or rebound no palpable mass or hepatosplenomegaly bowel sounds are present, Rectal; exam no external lesion hard stool in the rectal vault dark blackish brown and 1+ heme positive, Extremities ;1+ edema bilaterally below the knees, Neuropsych; mood and affect appropriate       Assessment & Plan:    #1 59 yo  male with hx of recent melena- currently does not have melena but stool dark and heme + R/O PUD , gastropathy, occult lesion #2 hx acute lymphoblastic anemia-stem cell transplant #3 O2 dependent COPD-followed at [facility] #4 seizure discorder #5 chronic pain syndrome chronic narcotic use #6 AODM #7 hx recurrent DVT/PE-currentlt off anticoagulation #8 CHF #9 diverticulosis  Plan; repeat CBC today and BMET Start BID protonix 40 mg x one month then QD  Pt is very high risk for sedation complications and would need hospital procedure/EGD- his pulmonologist has told him not to be sedated Will need records from [facility]- if hgb stable may opt for UGI.-will discuss with Dr Olene Floss PA-C 07/14/2014   Cc: Claretta Fraise, MD

## 2014-07-15 NOTE — Progress Notes (Signed)
Please schedule pt for UGI series    ,,,recent melena- poor candidate for EGD

## 2014-07-16 ENCOUNTER — Encounter: Payer: Self-pay | Admitting: Internal Medicine

## 2014-07-16 NOTE — Progress Notes (Signed)
Patient ID: Derek Shepherd, male   DOB: 08-Apr-1955, 59 y.o.   MRN: [id number] The patient's chart has been reviewed by Dr. Carlean Purl  and the recommendations are noted below. .   Follow-up advised. Schedule patient for next available appointment. Outcome of communication with the patient: Letter mailed

## 2014-07-21 ENCOUNTER — Telehealth: Payer: Self-pay

## 2014-07-21 NOTE — Progress Notes (Signed)
Please be sure this pt gets scheduled for UGI... thanks

## 2014-07-21 NOTE — Telephone Encounter (Signed)
I have left message for the patient to call back. He needs an UGI scheduled. His HGB is stable. See labs.

## 2014-07-22 ENCOUNTER — Other Ambulatory Visit: Payer: Self-pay

## 2014-07-24 ENCOUNTER — Other Ambulatory Visit: Payer: Self-pay

## 2014-07-24 DIAGNOSIS — D62 Acute posthemorrhagic anemia: Secondary | ICD-10-CM

## 2014-07-24 DIAGNOSIS — K921 Melena: Secondary | ICD-10-CM

## 2014-07-24 NOTE — Telephone Encounter (Signed)
Spoke with the patient and Mrs. Nestler. He agrees to UGI at [facility] on 07/31/14 at 11:00. Instructed to go fasting for 6 hours prior.

## 2014-07-24 NOTE — Telephone Encounter (Signed)
I have left message for the patient to call back. See lab results. Needs an UGI

## 2014-07-29 ENCOUNTER — Ambulatory Visit (HOSPITAL_COMMUNITY): Payer: Medicare Other

## 2014-07-31 ENCOUNTER — Ambulatory Visit (HOSPITAL_COMMUNITY)
Admission: RE | Admit: 2014-07-31 | Discharge: 2014-07-31 | Disposition: A | Discharge status: Home / Self Care | Payer: Medicare Other | Source: Ambulatory Visit | Attending: Physician Assistant | Admitting: Physician Assistant

## 2014-07-31 DIAGNOSIS — I509 Heart failure, unspecified: Secondary | ICD-10-CM | POA: Insufficient documentation

## 2014-07-31 DIAGNOSIS — D649 Anemia, unspecified: Secondary | ICD-10-CM | POA: Diagnosis not present

## 2014-07-31 DIAGNOSIS — J449 Chronic obstructive pulmonary disease, unspecified: Secondary | ICD-10-CM | POA: Insufficient documentation

## 2014-07-31 DIAGNOSIS — K921 Melena: Secondary | ICD-10-CM | POA: Diagnosis not present

## 2014-07-31 DIAGNOSIS — D62 Acute posthemorrhagic anemia: Secondary | ICD-10-CM

## 2014-08-05 ENCOUNTER — Other Ambulatory Visit: Payer: Self-pay

## 2014-08-05 DIAGNOSIS — D508 Other iron deficiency anemias: Secondary | ICD-10-CM

## 2014-08-18 ENCOUNTER — Other Ambulatory Visit: Payer: Self-pay | Admitting: *Deleted

## 2014-08-18 MED ORDER — BUDESONIDE-FORMOTEROL FUMARATE 160-4.5 MCG/ACT IN AERO: 2.0000 | INHALATION_SPRAY | Freq: Every morning | RESPIRATORY_TRACT | Status: DC

## 2014-08-29 ENCOUNTER — Encounter: Payer: Self-pay | Admitting: Family Medicine

## 2014-08-29 ENCOUNTER — Ambulatory Visit (INDEPENDENT_AMBULATORY_CARE_PROVIDER_SITE_OTHER): Payer: Medicare Other | Admitting: Family Medicine

## 2014-08-29 VITALS — BP 110/65 | HR 97 | Temp 97.4°F | Ht 66.0 in | Wt 221.4 lb

## 2014-08-29 DIAGNOSIS — J432 Centrilobular emphysema: Secondary | ICD-10-CM | POA: Diagnosis not present

## 2014-08-29 DIAGNOSIS — D539 Nutritional anemia, unspecified: Secondary | ICD-10-CM | POA: Diagnosis not present

## 2014-08-29 NOTE — Progress Notes (Signed)
Subjective:  Patient ID: Derek Shepherd, male    DOB: Aug 11, 1955  Age: 59 y.o. MRN: [id number]  CC: hemoglobin   HPI DARRYON BASTIN presents for chronic pancytopenia. Primarily he's been followed for the hemoglobin. He denies the weakness and cold sensation that goes with anemia. He has not had any recent severe infections or bleeding episodes that could be related to lose weight count or low platelets.he also is under treatment for COPD. He does have dyspnea on exertion. He can walk a few blocks. He is not oxygen dependent. He does have a home nebulizerfor his COPD  History Pheonix has a past medical history of Leukemia-lymphoma, T-cell, acute, HTLV-I-associated; COPD (chronic obstructive pulmonary disease); DVT (deep venous thrombosis); Personal history of colonic  adenoma (01/22/2008); Anemia; Anxiety; Arthritis; Depression; Gallstones; Bowel obstruction; Adenomatous colon polyp; and Diverticulosis.   He has past surgical history that includes Cholecystectomy; Lung surgery (Left); Exploratory laparotomy; Hernia repair; and Colonoscopy w/ biopsies.   His family history includes Clotting disorder in his mother; Diabetes in his brother, father, maternal aunt, and sister; Heart attack in his father and mother; Prostate cancer in his brother.He reports that he quit smoking about 21 years ago. His smoking use included Cigarettes. He has never used smokeless tobacco. He reports that he does not drink alcohol or use illicit drugs.  Outpatient Prescriptions Prior to Visit  Medication Sig Dispense Refill  . albuterol (PROVENTIL) (2.5 MG/3ML) 0.083% nebulizer solution Take 3 mLs (2.5 mg total) by nebulization every 6 (six) hours as needed for wheezing or shortness of breath. 75 mL 4  . ALPRAZolam (XANAX) 0.25 MG tablet 3 (three) times daily as needed.     . budesonide-formoterol (SYMBICORT) 160-4.5 MCG/ACT inhaler Inhale 2 puffs into the lungs every morning. 1 Inhaler 2  . citalopram (CELEXA) 20 MG  tablet Take 20 mg by mouth daily.     . fentaNYL (DURAGESIC - DOSED MCG/HR) 25 MCG/HR patch Place 25 mcg onto the skin every other day.     . fluticasone (FLONASE) 50 MCG/ACT nasal spray Place 1 spray into the nose.    . folic acid (FOLVITE) 1 MG tablet Take 1 mg by mouth.    . furosemide (LASIX) 80 MG tablet 80 mg 2 (two) times daily.   1  . [START ON 10/05/2014] HYDROcodone-acetaminophen (NORCO/VICODIN) 5-325 MG per tablet Take 1 tablet by mouth.    . lactulose (CHRONULAC) 10 GM/15ML solution Take 10 g by mouth.    . Magnesium Chloride-Calcium 64-106 MG TBEC Take 2 tablets by mouth.    . montelukast (SINGULAIR) 10 MG tablet Take 10 mg by mouth.    . Multiple Vitamins tablet Take 1 tablet by mouth.    . Naloxone HCl 0.4 MG/0.4ML SOAJ 0.4 mg.    . NUCYNTA 50 MG TABS tablet Take 50 mg by mouth every 6 (six) hours as needed.     . ONE TOUCH ULTRA TEST test strip USE ONE STRIP TO CHECK GLUCOSE ONCE DAILY IN THE MORNING FOR  FASTING  BLOOD  SUGAR 100 each 0  . OXYGEN Inhale into the lungs. Continuous 5 LPM when up and about 3 LPM constant and at night    . pantoprazole (PROTONIX) 40 MG tablet Take 1 tablet (40 mg total) by mouth daily. 30 tablet 2  . Polyethyl Glycol-Propyl Glycol (SYSTANE PRESERVATIVE FREE) 0.4-0.3 % SOLN Place 1 drop into both eyes. 1 drop in each eye prn    . predniSONE (DELTASONE) 20 MG tablet Take  20 mg by mouth daily with breakfast.     . promethazine (PHENERGAN) 25 MG tablet TAKE ONE TABLET BY MOUTH 4 TIMES DAILY AS NEEDED FOR NAUSEA 30 tablet 2  . Sirolimus 0.5 MG TABS Take 0.5 mg by mouth.    . SPIRIVA HANDIHALER 18 MCG inhalation capsule   2  . sulfamethoxazole-trimethoprim (BACTRIM DS,SEPTRA DS) 800-160 MG per tablet Take 1 tablet by mouth.    . vitamin B-12 (CYANOCOBALAMIN) 1000 MCG tablet Take 1,000 mcg by mouth.    . potassium chloride (MICRO-K) 10 MEQ CR capsule 20 mEq. Takes 2 capsules daily    . tiotropium (SPIRIVA HANDIHALER) 18 MCG inhalation capsule Place 1  capsule into inhaler and inhale. Every AM     No facility-administered medications prior to visit.    ROS Review of Systems  Constitutional: Negative for fever, chills, diaphoresis and unexpected weight change.  HENT: Negative for congestion, hearing loss, rhinorrhea, sore throat and trouble swallowing.   Respiratory: Negative for cough, chest tightness, shortness of breath and wheezing.   Gastrointestinal: Negative for nausea, vomiting, abdominal pain, diarrhea, constipation and abdominal distention.  Endocrine: Negative for cold intolerance and heat intolerance.  Genitourinary: Negative for dysuria, hematuria and flank pain.  Musculoskeletal: Negative for joint swelling and arthralgias.  Skin: Negative for rash.  Neurological: Negative for dizziness and headaches.  Psychiatric/Behavioral: Negative for dysphoric mood, decreased concentration and agitation. The patient is not nervous/anxious.     Objective:  BP 110/65 mmHg  Pulse 97  Temp(Src) 97.4 F (36.3 C) (Oral)  Ht 5\' 6"  (1.676 m)  Wt 221 lb 6.4 oz (100.426 kg)  BMI 35.75 kg/m2  SpO2 97%  BP Readings from Last 3 Encounters:  08/29/14 110/65  07/14/14 100/62  07/10/14 114/60    Wt Readings from Last 3 Encounters:  08/29/14 221 lb 6.4 oz (100.426 kg)  07/14/14 220 lb (99.791 kg)  07/10/14 221 lb 4 oz (100.358 kg)     Physical Exam  Constitutional: He is oriented to person, place, and time. He appears well-developed and well-nourished. No distress.  HENT:  Head: Normocephalic and atraumatic.  Right Ear: External ear normal.  Left Ear: External ear normal.  Nose: Nose normal.  Mouth/Throat: Oropharynx is clear and moist.  Eyes: Conjunctivae and EOM are normal. Pupils are equal, round, and reactive to light.  Neck: Normal range of motion. Neck supple. No thyromegaly present.  Cardiovascular: Normal rate, regular rhythm and normal heart sounds.   No murmur heard. Pulmonary/Chest: Effort normal and breath sounds  normal. No respiratory distress. He has no wheezes. He has no rales.  Abdominal: Soft. Bowel sounds are normal. He exhibits no distension. There is no tenderness.  Lymphadenopathy:    He has no cervical adenopathy.  Neurological: He is alert and oriented to person, place, and time. He has normal reflexes.  Skin: Skin is warm and dry.  Psychiatric: He has a normal mood and affect. His behavior is normal. Judgment and thought content normal.    Lab Results  Component Value Date   HGBA1C 5.2 05/19/2014   HGBA1C 5.4 10/31/2013   HGBA1C 5.6 04/22/2013    Lab Results  Component Value Date   WBC 4.5 08/29/2014   HGB 9.9* 07/14/2014   HCT 29.0* 08/29/2014   PLT 102.0* 07/14/2014   GLUCOSE 119* 08/29/2014   CHOL 184 05/16/2014   TRIG 124 05/16/2014   HDL 53 05/16/2014   LDLCALC 144* 10/31/2013   ALT 24 01/22/2014   AST 37 01/22/2014  NA 144 08/29/2014   K 3.9 08/29/2014   CL 97 08/29/2014   CREATININE 0.59* 08/29/2014   BUN 13 08/29/2014   CO2 35* 08/29/2014   PSA 1.0 02/15/2013   INR 1.1* 07/14/2014   HGBA1C 5.2 05/19/2014    Dg Ugi  W/kub  07/31/2014   CLINICAL DATA:  Blood in stool. Anemia. COPD. Leukemia/lymphoma. Congestive heart failure.  EXAM: UPPER GI SERIES WITHOUT KUB  TECHNIQUE: Routine upper GI series was performed with thin and thick barium barium.  FLUOROSCOPY TIME:  Fluoroscopy Time (in minutes and seconds): 2 minutes and 40 seconds  Number of Acquired Images:  0  COMPARISON:  Abdominal pelvic CT of 07/16/2009  FINDINGS: Double contrast evaluation of the esophagus demonstrates no mucosal abnormality.  Double-contrast evaluation of the stomach is unremarkable, without mass, wall thickening, or ulcer.  Normal duodenal bulb and C-loop.  Full column evaluation of the esophagus demonstrates no persistent narrowing or stricture. Contrast stasis identified within the lower esophagus.  IMPRESSION: 1. Lower esophageal contrast stasis, likely related to dysmotility. 2. No  explanation for anemia or heme-positive stools.   Electronically Signed   By: Abigail Miyamoto M.D.   On: 07/31/2014 14:08    Assessment & Plan:   Zarif was seen today for hemoglobin.  Diagnoses and all orders for this visit:  Macrocytic anemia Orders: -     Cancel: POCT CBC -     Folate -     Vitamin B12 -     Basic Metabolic Panel -     CBC with Differential/Platelet  Centrilobular emphysema Orders: -     CBC with Differential/Platelet   I am having Mr. Seiler maintain his ALPRAZolam, citalopram, fentaNYL, predniSONE, NUCYNTA, potassium chloride, fluticasone, lactulose, Multiple Vitamins, tiotropium, furosemide, Polyethyl Glycol-Propyl Glycol, sulfamethoxazole-trimethoprim, folic acid, vitamin I-09, ONE TOUCH ULTRA TEST, Sirolimus, promethazine, albuterol, HYDROcodone-acetaminophen, Magnesium Chloride-Calcium, montelukast, Naloxone HCl, SPIRIVA HANDIHALER, OXYGEN, pantoprazole, and budesonide-formoterol.  No orders of the defined types were placed in this encounter.     Follow-up: Return in about 3 months (around 11/29/2014).  Claretta Fraise, M.D.

## 2014-08-30 LAB — CBC WITH DIFFERENTIAL/PLATELET
BASOS: 1 %
Basophils Absolute: 0 10*3/uL (ref 0.0–0.2)
EOS (ABSOLUTE): 0.2 10*3/uL (ref 0.0–0.4)
Eos: 4 %
HEMATOCRIT: 29 % — AB (ref 37.5–51.0)
Hemoglobin: 9.2 g/dL — ABNORMAL LOW (ref 12.6–17.7)
IMMATURE GRANS (ABS): 0 10*3/uL (ref 0.0–0.1)
IMMATURE GRANULOCYTES: 0 %
LYMPHS: 25 %
Lymphocytes Absolute: 1.1 10*3/uL (ref 0.7–3.1)
MCH: 33.1 pg — AB (ref 26.6–33.0)
MCHC: 31.7 g/dL (ref 31.5–35.7)
MCV: 104 fL — AB (ref 79–97)
MONOCYTES: 9 %
Monocytes Absolute: 0.4 10*3/uL (ref 0.1–0.9)
Neutrophils Absolute: 2.7 10*3/uL (ref 1.4–7.0)
Neutrophils: 61 %
Platelets: 100 10*3/uL — CL (ref 150–379)
RBC: 2.78 x10E6/uL — AB (ref 4.14–5.80)
RDW: 15.5 % — ABNORMAL HIGH (ref 12.3–15.4)
WBC: 4.5 10*3/uL (ref 3.4–10.8)

## 2014-08-30 LAB — BASIC METABOLIC PANEL
BUN/Creatinine Ratio: 22 — ABNORMAL HIGH (ref 9–20)
BUN: 13 mg/dL (ref 6–24)
CALCIUM: 9.1 mg/dL (ref 8.7–10.2)
CHLORIDE: 97 mmol/L (ref 97–108)
CO2: 35 mmol/L — ABNORMAL HIGH (ref 18–29)
CREATININE: 0.59 mg/dL — AB (ref 0.76–1.27)
GFR calc Af Amer: 131 mL/min/{1.73_m2} (ref >59)
GFR, EST NON AFRICAN AMERICAN: 113 mL/min/{1.73_m2} (ref >59)
Glucose: 119 mg/dL — ABNORMAL HIGH (ref 65–99)
POTASSIUM: 3.9 mmol/L (ref 3.5–5.2)
SODIUM: 144 mmol/L (ref 134–144)

## 2014-08-30 LAB — FOLATE: Folate: >20 ng/mL (ref >3.0)

## 2014-08-30 LAB — VITAMIN B12: VITAMIN B 12: 546 pg/mL (ref 211–946)

## 2014-09-02 ENCOUNTER — Telehealth: Payer: Self-pay | Admitting: *Deleted

## 2014-09-02 NOTE — Telephone Encounter (Signed)
-----   Message from Georga Kaufmann, LPN sent at 7/35/6701  8:25 AM EDT -----   ----- Message -----    From: Claretta Fraise, MD    Sent: 08/31/2014   1:49 PM      To: [facility] Clinical Pool [facility]  Hello Lesley, Your lab results are stable. You still have a moderate anemia and a mild deficiency of platelets. Best Regards, Claretta Fraise, M.D.

## 2014-09-02 NOTE — Telephone Encounter (Signed)
Pt notified of results Verbalizes understanding 

## 2014-09-09 ENCOUNTER — Ambulatory Visit (INDEPENDENT_AMBULATORY_CARE_PROVIDER_SITE_OTHER): Payer: Medicare Other | Admitting: Family

## 2014-09-09 ENCOUNTER — Encounter: Payer: Self-pay | Admitting: Family

## 2014-09-09 VITALS — BP 116/68 | HR 66 | Temp 99.0°F | Ht 66.0 in | Wt 220.2 lb

## 2014-09-09 DIAGNOSIS — W57XXXA Bitten or stung by nonvenomous insect and other nonvenomous arthropods, initial encounter: Secondary | ICD-10-CM

## 2014-09-09 DIAGNOSIS — S1080XA Unspecified superficial injury of other specified part of neck, initial encounter: Secondary | ICD-10-CM | POA: Diagnosis not present

## 2014-09-09 MED ORDER — DOXYCYCLINE HYCLATE 100 MG PO TABS: 200.0000 mg | ORAL_TABLET | Freq: Once | ORAL | Status: DC

## 2014-09-09 NOTE — Patient Instructions (Signed)
Tick Bite Information Ticks are insects that attach themselves to the skin and draw blood for food. There are various types of ticks. Common types include wood ticks and deer ticks. Most ticks live in shrubs and grassy areas. Ticks can climb onto your body when you make contact with leaves or grass where the tick is waiting. The most common places on the body for ticks to attach themselves are the scalp, neck, armpits, waist, and groin. Most tick bites are harmless, but sometimes ticks carry germs that cause diseases. These germs can be spread to a person during the tick's feeding process. The chance of a disease spreading through a tick bite depends on:   The type of tick.  Time of year.   How long the tick is attached.   Geographic location.  HOW CAN YOU PREVENT TICK BITES? Take these steps to help prevent tick bites when you are outdoors:  Wear protective clothing. Long sleeves and long pants are best.   Wear white clothes so you can see ticks more easily.  Tuck your pant legs into your socks.   If walking on a trail, stay in the middle of the trail to avoid brushing against bushes.  Avoid walking through areas with long grass.  Put insect repellent on all exposed skin and along boot tops, pant legs, and sleeve cuffs.   Check clothing, hair, and skin repeatedly and before going inside.   Brush off any ticks that are not attached.  Take a shower or bath as soon as possible after being outdoors.  WHAT IS THE PROPER WAY TO REMOVE A TICK? Ticks should be removed as soon as possible to help prevent diseases caused by tick bites. 1. If latex gloves are available, put them on before trying to remove a tick.  2. Using fine-point tweezers, grasp the tick as close to the skin as possible. You may also use curved forceps or a tick removal tool. Grasp the tick as close to its head as possible. Avoid grasping the tick on its body. 3. Pull gently with steady upward pressure until  the tick lets go. Do not twist the tick or jerk it suddenly. This may break off the tick's head or mouth parts. 4. Do not squeeze or crush the tick's body. This could force disease-carrying fluids from the tick into your body.  5. After the tick is removed, wash the bite area and your hands with soap and water or other disinfectant such as alcohol. 6. Apply a small amount of antiseptic cream or ointment to the bite site.  7. Wash and disinfect any instruments that were used.  Do not try to remove a tick by applying a hot match, petroleum jelly, or fingernail polish to the tick. These methods do not work and may increase the chances of disease being spread from the tick bite.  WHEN SHOULD YOU SEEK MEDICAL CARE? Contact your health care provider if you are unable to remove a tick from your skin or if a part of the tick breaks off and is stuck in the skin.  After a tick bite, you need to be aware of signs and symptoms that could be related to diseases spread by ticks. Contact your health care provider if you develop any of the following in the days or weeks after the tick bite:  Unexplained fever.  Rash. A circular rash that appears days or weeks after the tick bite may indicate the possibility of Lyme disease. The rash may resemble   a target with a bull's-eye and may occur at a different part of your body than the tick bite.  Redness and swelling in the area of the tick bite.   Tender, swollen lymph glands.   Diarrhea.   Weight loss.   Cough.   Fatigue.   Muscle, joint, or bone pain.   Abdominal pain.   Headache.   Lethargy or a change in your level of consciousness.  Difficulty walking or moving your legs.   Numbness in the legs.   Paralysis.  Shortness of breath.   Confusion.   Repeated vomiting.  Document Released: 02/19/2000 Document Revised: 12/12/2012 Document Reviewed: 08/01/2012 [employer] Patient Information 2015 [employer]. This information is  not intended to replace advice given to you by your health care provider. Make sure you discuss any questions you have with your health care provider.  

## 2014-09-09 NOTE — Progress Notes (Signed)
   Subjective:    Patient ID: Derek Shepherd, male    DOB: 04-19-1955, 59 y.o.   MRN: [id number]  HPI Pt presents to the office today for an embedded tick on left neck. Pt states he noticed the tick on Friday evening and "scratched" and part of the tick came out. Pt states the area is red and still looks "like it has something in it". Pt denies any fever, rash, or new joint pain.    Review of Systems  Constitutional: Negative.   HENT: Negative.   Respiratory: Negative.   Cardiovascular: Negative.   Gastrointestinal: Negative.   Endocrine: Negative.   Genitourinary: Negative.   Musculoskeletal: Negative.   Neurological: Negative.   Hematological: Negative.   Psychiatric/Behavioral: Negative.   All other systems reviewed and are negative.      Objective:   Physical Exam  Constitutional: He is oriented to person, place, and time. He appears well-developed and well-nourished. No distress.  HENT:  Head: Normocephalic.  Eyes: Pupils are equal, round, and reactive to light. Right eye exhibits no discharge. Left eye exhibits no discharge.  Neck: Normal range of motion. Neck supple. No thyromegaly present.  Cardiovascular: Normal rate, regular rhythm, normal heart sounds and intact distal pulses.   No murmur heard. Pulmonary/Chest: Effort normal and breath sounds normal. No respiratory distress. He has no wheezes.  Pt on portable O2 with diminished breath sounds   Musculoskeletal: Normal range of motion. He exhibits no edema or tenderness.  Neurological: He is alert and oriented to person, place, and time. He has normal reflexes. No cranial nerve deficit.  Skin: Skin is warm and dry. Rash noted. Rash is pustular (Small erythemas pustular on left side of neck- serous drainage- No tick seen). No erythema.  Psychiatric: He has a normal mood and affect. His behavior is normal. Judgment and thought content normal.  Vitals reviewed.     BP 116/68 mmHg  Pulse 66  Temp(Src) 99 F (37.2  C) (Oral)  Ht 5\' 6"  (1.676 m)  Wt 220 lb 3.2 oz (99.882 kg)  BMI 35.56 kg/m2     Assessment & Plan:  1. Tick bite --Pt to report any new fever, joint pain, or rash -Wear protective clothing while outside- Long sleeves and long pants -Put insect repellent on all exposed skin and along clothing -Take a shower as soon as possible after being outside - doxycycline (VIBRA-TABS) 100 MG tablet; Take 2 tablets (200 mg total) by mouth once.  Dispense: 2 tablet; Refill: 0  Evelina Dun, FNP

## 2014-11-13 ENCOUNTER — Telehealth: Payer: Self-pay | Admitting: *Deleted

## 2014-11-13 MED ORDER — PANTOPRAZOLE SODIUM 40 MG PO TBEC
40.0000 mg | DELAYED_RELEASE_TABLET | Freq: Every day | ORAL | Status: DC
Start: 2014-11-13 — End: 2014-12-18

## 2014-11-13 NOTE — Telephone Encounter (Signed)
I advised the patient that I got a request to do a prior authorization for twice daily dosing.  Per Nicoletta Ba PA, from his last office visit in May, 2016, he was to take 1 tab twice daily for one month then go to once daily.  He said he is only taking it once daily. I asked if it was helping. He asked, " what is it supposed to do?" I told him it is to help with heartburn, Reflux.  He said he isn't having any problems right now and is taking it once daily. I will advise the pharmacy to change the prescription to once daily. No prior authorization needed.

## 2014-12-01 ENCOUNTER — Ambulatory Visit (INDEPENDENT_AMBULATORY_CARE_PROVIDER_SITE_OTHER): Payer: Medicare Other | Admitting: Family Medicine

## 2014-12-01 ENCOUNTER — Encounter: Payer: Self-pay | Admitting: Family Medicine

## 2014-12-01 VITALS — BP 116/66 | HR 90 | Temp 97.5°F | Ht 66.0 in | Wt 221.4 lb

## 2014-12-01 DIAGNOSIS — E119 Type 2 diabetes mellitus without complications: Secondary | ICD-10-CM

## 2014-12-01 DIAGNOSIS — G894 Chronic pain syndrome: Secondary | ICD-10-CM

## 2014-12-01 DIAGNOSIS — E785 Hyperlipidemia, unspecified: Secondary | ICD-10-CM

## 2014-12-01 DIAGNOSIS — J432 Centrilobular emphysema: Secondary | ICD-10-CM | POA: Diagnosis not present

## 2014-12-01 DIAGNOSIS — I509 Heart failure, unspecified: Secondary | ICD-10-CM | POA: Diagnosis not present

## 2014-12-01 LAB — POCT GLYCOSYLATED HEMOGLOBIN (HGB A1C): Hemoglobin A1C: 5.9

## 2014-12-01 NOTE — Progress Notes (Signed)
Subjective:  Patient ID: Derek Shepherd, male    DOB: 05-28-1955  Age: 59 y.o. MRN: [id number]  CC: COPD; Congestive Heart Failure; Diabetes; and Gastrophageal Reflux   HPI Derek Shepherd presents for  follow-up of hypertension. Patient has no history of headache chest pain or  recent cough. Patient also denies symptoms of TIA such as numbness weakness lateralizing. Patient checks  blood pressure at home and has not had any elevated readings recently. Patient denies side effects from his medication. States taking it regularly.  Patient is oxygen dependent for his COPD. He is able to ambulate around the home. However sometimes walking from the front of the house to the tach of the house he will develop some dyspnea. He is able to perform ADLs as long as he wears his oxygen. He is prednisone dependent for his breathing.  Follow-up of diabetes. Patient does not check blood sugar at home. He follows a regular diet. However prednisone seems to be increasing his appetite.  Patient denies symptoms such as polyuria, polydipsia, excessive hunger, nausea No significant hypoglycemic spells noted. No meds. Primarily elevated when taking steroids. His current dose of prednisone does not seem to elevate his glucose. However, hospitalization doses reduce and it is high as 300.  He is followed for chronic degenerative disc disease with a chronic pain syndrome secondary to that. He is followed by his orthopedist who prescribes his opiate and other medications.   History Derek Shepherd has a past medical history of Leukemia-lymphoma, T-cell, acute, HTLV-I-associated; COPD (chronic obstructive pulmonary disease); DVT (deep venous thrombosis); Personal history of colonic  adenoma (01/22/2008); Anemia; Anxiety; Arthritis; Depression; Gallstones; Bowel obstruction; Adenomatous colon polyp; and Diverticulosis.   He has past surgical history that includes Cholecystectomy; Lung surgery (Left); Exploratory laparotomy;  Hernia repair; and Colonoscopy w/ biopsies.   His family history includes Clotting disorder in his mother; Diabetes in his brother, father, maternal aunt, and sister; Heart attack in his father and mother; Prostate cancer in his brother.He reports that he quit smoking about 21 years ago. His smoking use included Cigarettes. He has never used smokeless tobacco. He reports that he does not drink alcohol or use illicit drugs.  Current Outpatient Prescriptions on File Prior to Visit  Medication Sig Dispense Refill  . albuterol (PROVENTIL) (2.5 MG/3ML) 0.083% nebulizer solution Take 3 mLs (2.5 mg total) by nebulization every 6 (six) hours as needed for wheezing or shortness of breath. 75 mL 4  . ALPRAZolam (XANAX) 0.25 MG tablet 3 (three) times daily as needed.     . budesonide-formoterol (SYMBICORT) 160-4.5 MCG/ACT inhaler Inhale 2 puffs into the lungs every morning. 1 Inhaler 2  . citalopram (CELEXA) 20 MG tablet Take 20 mg by mouth daily.     . fentaNYL (DURAGESIC - DOSED MCG/HR) 25 MCG/HR patch Place 25 mcg onto the skin every other day.     . fluticasone (FLONASE) 50 MCG/ACT nasal spray Place 1 spray into the nose.    . folic acid (FOLVITE) 1 MG tablet Take 1 mg by mouth.    . furosemide (LASIX) 80 MG tablet 80 mg 2 (two) times daily.   1  . lactulose (CHRONULAC) 10 GM/15ML solution Take 10 g by mouth.    . Magnesium Chloride-Calcium 64-106 MG TBEC Take 2 tablets by mouth.    . montelukast (SINGULAIR) 10 MG tablet Take 10 mg by mouth.    . Multiple Vitamins tablet Take 1 tablet by mouth.    . ONE TOUCH ULTRA TEST  test strip USE ONE STRIP TO CHECK GLUCOSE ONCE DAILY IN THE MORNING FOR  FASTING  BLOOD  SUGAR 100 each 0  . OXYGEN Inhale into the lungs. Continuous 5 LPM when up and about 3 LPM constant and at night    . pantoprazole (PROTONIX) 40 MG tablet Take 1 tablet (40 mg total) by mouth daily. 90 tablet 3  . Polyethyl Glycol-Propyl Glycol (SYSTANE PRESERVATIVE FREE) 0.4-0.3 % SOLN Place 1  drop into both eyes. 1 drop in each eye prn    . potassium chloride (MICRO-K) 10 MEQ CR capsule 30 mEq 2 (two) times daily. Takes 2 capsules daily    . predniSONE (DELTASONE) 20 MG tablet Take 20 mg by mouth daily with breakfast.     . promethazine (PHENERGAN) 25 MG tablet TAKE ONE TABLET BY MOUTH 4 TIMES DAILY AS NEEDED FOR NAUSEA 30 tablet 2  . Sirolimus 0.5 MG TABS Take 0.5 mg by mouth.    . SPIRIVA HANDIHALER 18 MCG inhalation capsule   2  . sulfamethoxazole-trimethoprim (BACTRIM DS,SEPTRA DS) 800-160 MG per tablet Take 1 tablet by mouth.    . vitamin B-12 (CYANOCOBALAMIN) 1000 MCG tablet Take 1,000 mcg by mouth.     No current facility-administered medications on file prior to visit.    ROS Review of Systems  Constitutional: Negative for fever, chills and diaphoresis.  HENT: Negative for congestion, rhinorrhea and sore throat.   Respiratory: Positive for shortness of breath. Negative for cough and wheezing.   Cardiovascular: Negative for chest pain.  Gastrointestinal: Negative for nausea, vomiting, abdominal pain, diarrhea, constipation and abdominal distention.  Genitourinary: Negative for dysuria and frequency.  Musculoskeletal: Negative for joint swelling and arthralgias.  Skin: Negative for rash.  Neurological: Negative for headaches.    Objective:  BP 116/66 mmHg  Pulse 90  Temp(Src) 97.5 F (36.4 C) (Oral)  Ht 5' 6"  (1.676 m)  Wt 221 lb 6.4 oz (100.426 kg)  BMI 35.75 kg/m2  SpO2 95%  BP Readings from Last 3 Encounters:  12/01/14 116/66  09/09/14 116/68  08/29/14 110/65    Wt Readings from Last 3 Encounters:  12/01/14 221 lb 6.4 oz (100.426 kg)  09/09/14 220 lb 3.2 oz (99.882 kg)  08/29/14 221 lb 6.4 oz (100.426 kg)     Physical Exam  Constitutional: He is oriented to person, place, and time. He appears well-developed and well-nourished. No distress.  HENT:  Head: Normocephalic and atraumatic.  Right Ear: External ear normal.  Left Ear: External ear  normal.  Nose: Nose normal.  Mouth/Throat: Oropharynx is clear and moist.  Eyes: Conjunctivae and EOM are normal. Pupils are equal, round, and reactive to light.  Neck: Normal range of motion. Neck supple. No thyromegaly present.  Cardiovascular: Normal rate, regular rhythm and normal heart sounds.   No murmur heard. Pulmonary/Chest: Effort normal. No respiratory distress. He has no wheezes. He has no rales.  Breath sounds distant with diminished expiratory phase  Abdominal: Soft. Bowel sounds are normal. He exhibits no distension. There is no tenderness.  Musculoskeletal: Normal range of motion.  Lymphadenopathy:    He has no cervical adenopathy.  Neurological: He is alert and oriented to person, place, and time. He has normal reflexes.  Skin: Skin is warm and dry.  Psychiatric: He has a normal mood and affect. His behavior is normal. Judgment and thought content normal.    Lab Results  Component Value Date   HGBA1C 5.9 12/01/2014   HGBA1C 5.2 05/19/2014   HGBA1C 5.4  10/31/2013    Lab Results  Component Value Date   WBC 4.5 08/29/2014   HGB 9.9* 07/14/2014   HCT 29.0* 08/29/2014   PLT 102.0* 07/14/2014   GLUCOSE 119* 08/29/2014   CHOL 184 05/16/2014   TRIG 124 05/16/2014   HDL 53 05/16/2014   LDLCALC 144* 10/31/2013   ALT 24 01/22/2014   AST 37 01/22/2014   NA 144 08/29/2014   K 3.9 08/29/2014   CL 97 08/29/2014   CREATININE 0.59* 08/29/2014   BUN 13 08/29/2014   CO2 35* 08/29/2014   PSA 1.0 02/15/2013   INR 1.1* 07/14/2014   HGBA1C 5.9 12/01/2014    Dg Ugi  W/kub  07/31/2014   CLINICAL DATA:  Blood in stool. Anemia. COPD. Leukemia/lymphoma. Congestive heart failure.  EXAM: UPPER GI SERIES WITHOUT KUB  TECHNIQUE: Routine upper GI series was performed with thin and thick barium barium.  FLUOROSCOPY TIME:  Fluoroscopy Time (in minutes and seconds): 2 minutes and 40 seconds  Number of Acquired Images:  0  COMPARISON:  Abdominal pelvic CT of 07/16/2009  FINDINGS: Double  contrast evaluation of the esophagus demonstrates no mucosal abnormality.  Double-contrast evaluation of the stomach is unremarkable, without mass, wall thickening, or ulcer.  Normal duodenal bulb and C-loop.  Full column evaluation of the esophagus demonstrates no persistent narrowing or stricture. Contrast stasis identified within the lower esophagus.  IMPRESSION: 1. Lower esophageal contrast stasis, likely related to dysmotility. 2. No explanation for anemia or heme-positive stools.   Electronically Signed   By: Abigail Miyamoto M.D.   On: 07/31/2014 14:08    Assessment & Plan:   Navon was seen today for copd, congestive heart failure, diabetes and gastrophageal reflux.  Diagnoses and all orders for this visit:  Type 2 diabetes mellitus without complication -     POCT glycosylated hemoglobin (Hb A1C) -     CMP14+EGFR -     Lipid panel -     CBC with Differential/Platelet  Chronic pain syndrome -     CMP14+EGFR -     Lipid panel -     CBC with Differential/Platelet  Chronic congestive heart failure, unspecified congestive heart failure type -     CMP14+EGFR -     Lipid panel -     CBC with Differential/Platelet  Centrilobular emphysema -     CMP14+EGFR -     Lipid panel -     CBC with Differential/Platelet  Hyperlipidemia -     CMP14+EGFR -     Lipid panel -     CBC with Differential/Platelet   I have discontinued Mr. Bezold's tiotropium and doxycycline. I am also having him maintain his ALPRAZolam, citalopram, fentaNYL, predniSONE, potassium chloride, fluticasone, lactulose, Multiple Vitamins, furosemide, Polyethyl Glycol-Propyl Glycol, sulfamethoxazole-trimethoprim, folic acid, vitamin T-25, ONE TOUCH ULTRA TEST, Sirolimus, promethazine, albuterol, Magnesium Chloride-Calcium, montelukast, SPIRIVA HANDIHALER, OXYGEN, budesonide-formoterol, pantoprazole, tapentadol, and rOPINIRole.  Meds ordered this encounter  Medications  . tapentadol (NUCYNTA) 50 MG TABS tablet    Sig:  Take 50 mg by mouth.  Marland Kitchen rOPINIRole (REQUIP) 1 MG tablet    Sig: TAKE 1 TABLET BY MOUTH AT BEDTIME   Encouraged to check sugar regularly at home. Record fasting and postprandial readings and bring to office for follow-up. Increase nebulizer treatments to 4 times a day in order to decrease mucus secretion   Follow-up: Return in about 3 months (around 03/02/2015) for diabetes, COPD. With PFT.  Claretta Fraise, M.D.

## 2014-12-01 NOTE — Patient Instructions (Signed)
Increase nebulizer treatments to 4 times a day in order to decrease mucus secretion

## 2014-12-02 LAB — CMP14+EGFR
ALBUMIN: 4 g/dL (ref 3.5–5.5)
ALT: 27 IU/L (ref 0–44)
AST: 32 IU/L (ref 0–40)
Albumin/Globulin Ratio: 2.1 (ref 1.1–2.5)
Alkaline Phosphatase: 79 IU/L (ref 39–117)
BUN / CREAT RATIO: 16 (ref 9–20)
BUN: 10 mg/dL (ref 6–24)
Bilirubin Total: <0.2 mg/dL (ref 0.0–1.2)
CO2: 36 mmol/L — AB (ref 18–29)
CREATININE: 0.64 mg/dL — AB (ref 0.76–1.27)
Calcium: 9.2 mg/dL (ref 8.7–10.2)
Chloride: 96 mmol/L — ABNORMAL LOW (ref 97–108)
GFR calc non Af Amer: 109 mL/min/{1.73_m2} (ref >59)
GFR, EST AFRICAN AMERICAN: 127 mL/min/{1.73_m2} (ref >59)
Globulin, Total: 1.9 g/dL (ref 1.5–4.5)
Glucose: 101 mg/dL — ABNORMAL HIGH (ref 65–99)
Potassium: 4.3 mmol/L (ref 3.5–5.2)
SODIUM: 143 mmol/L (ref 134–144)
TOTAL PROTEIN: 5.9 g/dL — AB (ref 6.0–8.5)

## 2014-12-02 LAB — CBC WITH DIFFERENTIAL/PLATELET
Basophils Absolute: 0 10*3/uL (ref 0.0–0.2)
Basos: 0 %
EOS (ABSOLUTE): 0.2 10*3/uL (ref 0.0–0.4)
EOS: 3 %
HEMATOCRIT: 28.3 % — AB (ref 37.5–51.0)
HEMOGLOBIN: 9.2 g/dL — AB (ref 12.6–17.7)
IMMATURE GRANS (ABS): 0 10*3/uL (ref 0.0–0.1)
Immature Granulocytes: 0 %
LYMPHS ABS: 1.2 10*3/uL (ref 0.7–3.1)
LYMPHS: 24 %
MCH: 34.7 pg — ABNORMAL HIGH (ref 26.6–33.0)
MCHC: 32.5 g/dL (ref 31.5–35.7)
MCV: 107 fL — ABNORMAL HIGH (ref 79–97)
MONOCYTES: 8 %
Monocytes Absolute: 0.4 10*3/uL (ref 0.1–0.9)
NEUTROS ABS: 3.3 10*3/uL (ref 1.4–7.0)
Neutrophils: 65 %
Platelets: 107 10*3/uL — ABNORMAL LOW (ref 150–379)
RBC: 2.65 x10E6/uL — AB (ref 4.14–5.80)
RDW: 15.7 % — ABNORMAL HIGH (ref 12.3–15.4)
WBC: 5.1 10*3/uL (ref 3.4–10.8)

## 2014-12-02 LAB — LIPID PANEL
Chol/HDL Ratio: 4.5 ratio units (ref 0.0–5.0)
Cholesterol, Total: 214 mg/dL — ABNORMAL HIGH (ref 100–199)
HDL: 48 mg/dL (ref >39)
LDL Calculated: 108 mg/dL — ABNORMAL HIGH (ref 0–99)
TRIGLYCERIDES: 290 mg/dL — AB (ref 0–149)
VLDL Cholesterol Cal: 58 mg/dL — ABNORMAL HIGH (ref 5–40)

## 2014-12-02 NOTE — Addendum Note (Signed)
Addended by: Claretta Fraise on: 12/02/2014 02:18 PM   Modules accepted: Miquel Dunn

## 2014-12-18 ENCOUNTER — Encounter: Payer: Self-pay | Admitting: Family Medicine

## 2014-12-18 ENCOUNTER — Ambulatory Visit (INDEPENDENT_AMBULATORY_CARE_PROVIDER_SITE_OTHER): Payer: Medicare Other | Admitting: Family Medicine

## 2014-12-18 ENCOUNTER — Other Ambulatory Visit: Payer: Self-pay | Admitting: *Deleted

## 2014-12-18 ENCOUNTER — Ambulatory Visit (INDEPENDENT_AMBULATORY_CARE_PROVIDER_SITE_OTHER): Payer: Medicare Other

## 2014-12-18 ENCOUNTER — Other Ambulatory Visit: Payer: Self-pay | Admitting: Physician Assistant

## 2014-12-18 VITALS — BP 126/71 | HR 73 | Temp 98.4°F | Ht 66.0 in | Wt 220.4 lb

## 2014-12-18 DIAGNOSIS — S8991XA Unspecified injury of right lower leg, initial encounter: Secondary | ICD-10-CM | POA: Diagnosis not present

## 2014-12-18 DIAGNOSIS — L03115 Cellulitis of right lower limb: Secondary | ICD-10-CM | POA: Insufficient documentation

## 2014-12-18 MED ORDER — CEPHALEXIN 500 MG PO CAPS: 500.0000 mg | ORAL_CAPSULE | Freq: Three times a day (TID) | ORAL | Status: DC

## 2014-12-18 MED ORDER — SPIRIVA HANDIHALER 18 MCG IN CAPS: 18.0000 ug | ORAL_CAPSULE | Freq: Every day | RESPIRATORY_TRACT | Status: DC

## 2014-12-18 NOTE — Progress Notes (Signed)
   HPI  Patient presents today here for concern of infection.  He explains that he hit a spot his right inner knee about 2 weeks ago , and began to get better and then he had again about one week ago. Over the last 2-3 days he's developed a red area that is warm and painful over the area. He's concerned that it's infected. He denies any fevers, chills, sweats, loss of appetite, or nausea. He's breathing at baseline. He is walking easily on the leg as usual.  He needs a refill of Spiriva 3 months    PMH: Smoking status noted ROS: Per HPI  Objective: BP 126/71 mmHg  Pulse 73  Temp(Src) 98.4 F (36.9 C) (Oral)  Ht 5\' 6"  (1.676 m)  Wt 220 lb 6.4 oz (99.973 kg)  BMI 35.59 kg/m2 Gen: NAD, alert, cooperative with exam HEENT: NCAT, EOMI, PERRL CV: RRR, good S1/S2, no murmur Resp: CTABL, no wheezes, non-labored Abd: SNTND, BS present, no guarding or organomegaly Ext: No edema, warm Neuro: Alert and oriented, No gross deficits  Plain film of the right knee- no acute bony abnormalities, he does seem to have some possible calcium deposition in the same area as the injury   Assessment and plan:  # Right knee injury, likely cellulitis Possibly just a hematoma, however given the spreading erythema and warmth I will go ahead and treat for infection.  Keflex 7 days- no Hx of MRSA Reasons to return reviewed   Orders Placed This Encounter  Procedures  . DG Knee 1-2 Views Right    Standing Status: Future     Number of Occurrences: 1     Standing Expiration Date: 02/17/2016    Order Specific Question:  Reason for Exam (SYMPTOM  OR DIAGNOSIS REQUIRED)    Answer:  r/o fracture    Order Specific Question:  Preferred imaging location?    Answer:  Internal    Meds ordered this encounter  Medications  . cephALEXin (KEFLEX) 500 MG capsule    Sig: Take 1 capsule (500 mg total) by mouth 3 (three) times daily.    Dispense:  21 capsule    Refill:  [place], MD [facility] 12/18/2014, 3:27 PM

## 2014-12-18 NOTE — Patient Instructions (Signed)
Great to meet you guys!  We are treating him for an infection. If it gets worse or fails to get better please come back.   Cellulitis Cellulitis is an infection of the skin and the tissue beneath it. The infected area is usually red and tender. Cellulitis occurs most often in the arms and lower legs.  CAUSES  Cellulitis is caused by bacteria that enter the skin through cracks or cuts in the skin. The most common types of bacteria that cause cellulitis are staphylococci and streptococci. SIGNS AND SYMPTOMS   Redness and warmth.  Swelling.  Tenderness or pain.  Fever. DIAGNOSIS  Your health care provider can usually determine what is wrong based on a physical exam. Blood tests may also be done. TREATMENT  Treatment usually involves taking an antibiotic medicine. HOME CARE INSTRUCTIONS   Take your antibiotic medicine as directed by your health care provider. Finish the antibiotic even if you start to feel better.  Keep the infected arm or leg elevated to reduce swelling.  Apply a warm cloth to the affected area up to 4 times per day to relieve pain.  Take medicines only as directed by your health care provider.  Keep all follow-up visits as directed by your health care provider. SEEK MEDICAL CARE IF:   You notice red streaks coming from the infected area.  Your red area gets larger or turns dark in color.  Your bone or joint underneath the infected area becomes painful after the skin has healed.  Your infection returns in the same area or another area.  You notice a swollen bump in the infected area.  You develop new symptoms.  You have a fever. SEEK IMMEDIATE MEDICAL CARE IF:   You feel very sleepy.  You develop vomiting or diarrhea.  You have a general ill feeling (malaise) with muscle aches and pains.   This information is not intended to replace advice given to you by your health care provider. Make sure you discuss any questions you have with your health care  provider.   Document Released: 12/01/2004 Document Revised: 11/12/2014 Document Reviewed: 05/09/2011 Elsevier Interactive Patient Education [employer].

## 2014-12-22 ENCOUNTER — Ambulatory Visit (INDEPENDENT_AMBULATORY_CARE_PROVIDER_SITE_OTHER): Payer: Medicare Other | Admitting: Family Medicine

## 2014-12-22 ENCOUNTER — Encounter: Payer: Self-pay | Admitting: Family Medicine

## 2014-12-22 VITALS — BP 145/77 | HR 73 | Temp 99.3°F | Ht 66.0 in | Wt 224.2 lb

## 2014-12-22 DIAGNOSIS — L03115 Cellulitis of right lower limb: Secondary | ICD-10-CM | POA: Diagnosis not present

## 2014-12-22 DIAGNOSIS — M7989 Other specified soft tissue disorders: Secondary | ICD-10-CM | POA: Diagnosis not present

## 2014-12-22 MED ORDER — DOXYCYCLINE HYCLATE 100 MG PO CAPS: 100.0000 mg | ORAL_CAPSULE | Freq: Two times a day (BID) | ORAL | Status: DC

## 2014-12-22 NOTE — Progress Notes (Signed)
   HPI  Patient presents today for right lower extremity cellulitis follow-up.  Patient explains that since our last visit his area of concern seems to have gotten a little bit smaller but has remained. He discusses his previous blood clots today, he's had 2 pulmonary embolisms and 2 DVTs. He was previously on blood thinners and stopped because of "internal bleeding"  He has leukemia and is being treated at [facility]  He denies any fever, chills, sweats, loss of appetite. He does have redness, warmth, tenderness of the area of concern in his right medial knee.  PMH: Smoking status noted ROS: Per HPI  Objective: BP 145/77 mmHg  Pulse 73  Temp(Src) 99.3 F (37.4 C) (Oral)  Ht 5\' 6"  (1.676 m)  Wt 224 lb 3.2 oz (101.696 kg)  BMI 36.20 kg/m2 Gen: NAD, alert, cooperative with exam HEENT: NCAT, EOMI, PERRL CV: RRR, good S1/S2, no murmur Resp: CTABL, no wheezes, non-labored, nasal cannula in place Ext: Right medial knee with area of erythema that measures 5.5 cm x 6 cm, central lesion 1.5 cm x 2.5 cm with no fluctuance, however he does have induration of this area. Circumference 47 cm on the right, 45 7 m in the left Neuro: Alert and oriented, No gross deficits  Assessment and plan:  # Cellulitis I still believe this is cellulitis, however is concerning as he has current leukemia and history of several clots. Rule out clot with lower extremity venous duplex after his oncology appointment tomorrow Change antibiotics to cover MRSA, he has no MRSA history, however Keflex does not seem to be covering adequately Follow-up if not improved or worsened    Meds ordered this encounter  Medications  . doxycycline (VIBRAMYCIN) 100 MG capsule    Sig: Take 1 capsule (100 mg total) by mouth 2 (two) times daily.    Dispense:  20 capsule    Refill:  [place], MD [facility] 12/22/2014, 4:01 PM

## 2014-12-22 NOTE — Patient Instructions (Signed)
Great to meet you again!  I am changing your antibiotics to cover for MRSA, doxycyline is the new one I have sent.   We will also schedule a venous ultrasound for you at [facility] to rule out clot.   Cellulitis Cellulitis is an infection of the skin and the tissue beneath it. The infected area is usually red and tender. Cellulitis occurs most often in the arms and lower legs.  CAUSES  Cellulitis is caused by bacteria that enter the skin through cracks or cuts in the skin. The most common types of bacteria that cause cellulitis are staphylococci and streptococci. SIGNS AND SYMPTOMS   Redness and warmth.  Swelling.  Tenderness or pain.  Fever. DIAGNOSIS  Your health care provider can usually determine what is wrong based on a physical exam. Blood tests may also be done. TREATMENT  Treatment usually involves taking an antibiotic medicine. HOME CARE INSTRUCTIONS   Take your antibiotic medicine as directed by your health care provider. Finish the antibiotic even if you start to feel better.  Keep the infected arm or leg elevated to reduce swelling.  Apply a warm cloth to the affected area up to 4 times per day to relieve pain.  Take medicines only as directed by your health care provider.  Keep all follow-up visits as directed by your health care provider. SEEK MEDICAL CARE IF:   You notice red streaks coming from the infected area.  Your red area gets larger or turns dark in color.  Your bone or joint underneath the infected area becomes painful after the skin has healed.  Your infection returns in the same area or another area.  You notice a swollen bump in the infected area.  You develop new symptoms.  You have a fever. SEEK IMMEDIATE MEDICAL CARE IF:   You feel very sleepy.  You develop vomiting or diarrhea.  You have a general ill feeling (malaise) with muscle aches and pains.   This information is not intended to replace advice given to you by your health  care provider. Make sure you discuss any questions you have with your health care provider.   Document Released: 12/01/2004 Document Revised: 11/12/2014 Document Reviewed: 05/09/2011 Elsevier Interactive Patient Education [employer].

## 2014-12-26 ENCOUNTER — Encounter (HOSPITAL_COMMUNITY): Payer: Self-pay | Admitting: *Deleted

## 2014-12-26 ENCOUNTER — Observation Stay (HOSPITAL_COMMUNITY)
Admission: EM | Admit: 2014-12-26 | Discharge: 2014-12-30 | Disposition: A | Discharge status: Home / Self Care | Payer: Medicare Other | Attending: Internal Medicine | Admitting: Internal Medicine

## 2014-12-26 ENCOUNTER — Emergency Department (HOSPITAL_COMMUNITY): Payer: Medicare Other

## 2014-12-26 DIAGNOSIS — F419 Anxiety disorder, unspecified: Secondary | ICD-10-CM | POA: Insufficient documentation

## 2014-12-26 DIAGNOSIS — X58XXXA Exposure to other specified factors, initial encounter: Secondary | ICD-10-CM | POA: Diagnosis not present

## 2014-12-26 DIAGNOSIS — J441 Chronic obstructive pulmonary disease with (acute) exacerbation: Secondary | ICD-10-CM | POA: Diagnosis present

## 2014-12-26 DIAGNOSIS — M1990 Unspecified osteoarthritis, unspecified site: Secondary | ICD-10-CM | POA: Insufficient documentation

## 2014-12-26 DIAGNOSIS — F329 Major depressive disorder, single episode, unspecified: Secondary | ICD-10-CM | POA: Insufficient documentation

## 2014-12-26 DIAGNOSIS — Z87891 Personal history of nicotine dependence: Secondary | ICD-10-CM | POA: Insufficient documentation

## 2014-12-26 DIAGNOSIS — J209 Acute bronchitis, unspecified: Secondary | ICD-10-CM | POA: Diagnosis present

## 2014-12-26 DIAGNOSIS — J440 Chronic obstructive pulmonary disease with acute lower respiratory infection: Principal | ICD-10-CM | POA: Insufficient documentation

## 2014-12-26 DIAGNOSIS — D649 Anemia, unspecified: Secondary | ICD-10-CM | POA: Diagnosis not present

## 2014-12-26 DIAGNOSIS — G894 Chronic pain syndrome: Secondary | ICD-10-CM | POA: Insufficient documentation

## 2014-12-26 DIAGNOSIS — C9101 Acute lymphoblastic leukemia, in remission: Secondary | ICD-10-CM | POA: Diagnosis present

## 2014-12-26 DIAGNOSIS — Z9889 Other specified postprocedural states: Secondary | ICD-10-CM | POA: Insufficient documentation

## 2014-12-26 DIAGNOSIS — L03115 Cellulitis of right lower limb: Secondary | ICD-10-CM | POA: Diagnosis present

## 2014-12-26 DIAGNOSIS — Z86718 Personal history of other venous thrombosis and embolism: Secondary | ICD-10-CM | POA: Insufficient documentation

## 2014-12-26 DIAGNOSIS — J189 Pneumonia, unspecified organism: Secondary | ICD-10-CM

## 2014-12-26 DIAGNOSIS — D696 Thrombocytopenia, unspecified: Secondary | ICD-10-CM | POA: Insufficient documentation

## 2014-12-26 DIAGNOSIS — Y9389 Activity, other specified: Secondary | ICD-10-CM | POA: Insufficient documentation

## 2014-12-26 DIAGNOSIS — J9621 Acute and chronic respiratory failure with hypoxia: Secondary | ICD-10-CM | POA: Diagnosis not present

## 2014-12-26 DIAGNOSIS — S8991XA Unspecified injury of right lower leg, initial encounter: Secondary | ICD-10-CM | POA: Insufficient documentation

## 2014-12-26 DIAGNOSIS — R0789 Other chest pain: Secondary | ICD-10-CM | POA: Diagnosis not present

## 2014-12-26 DIAGNOSIS — Y9289 Other specified places as the place of occurrence of the external cause: Secondary | ICD-10-CM | POA: Insufficient documentation

## 2014-12-26 DIAGNOSIS — I509 Heart failure, unspecified: Secondary | ICD-10-CM | POA: Diagnosis not present

## 2014-12-26 DIAGNOSIS — Y998 Other external cause status: Secondary | ICD-10-CM | POA: Insufficient documentation

## 2014-12-26 DIAGNOSIS — R0600 Dyspnea, unspecified: Secondary | ICD-10-CM

## 2014-12-26 LAB — BASIC METABOLIC PANEL
ANION GAP: 8 (ref 5–15)
BUN: 11 mg/dL (ref 6–20)
CALCIUM: 8.9 mg/dL (ref 8.9–10.3)
CO2: 33 mmol/L — AB (ref 22–32)
Chloride: 94 mmol/L — ABNORMAL LOW (ref 101–111)
Creatinine, Ser: 0.87 mg/dL (ref 0.61–1.24)
GFR calc Af Amer: >60 mL/min (ref >60)
GLUCOSE: 144 mg/dL — AB (ref 65–99)
Potassium: 3.8 mmol/L (ref 3.5–5.1)
Sodium: 135 mmol/L (ref 135–145)

## 2014-12-26 LAB — CBC WITH DIFFERENTIAL/PLATELET
Basophils Absolute: 0 10*3/uL (ref 0.0–0.1)
Basophils Relative: 0 %
EOS PCT: 0 %
Eosinophils Absolute: 0 10*3/uL (ref 0.0–0.7)
HEMATOCRIT: 28.2 % — AB (ref 39.0–52.0)
Hemoglobin: 8.9 g/dL — ABNORMAL LOW (ref 13.0–17.0)
LYMPHS PCT: 8 %
Lymphs Abs: 0.9 10*3/uL (ref 0.7–4.0)
MCH: 34.4 pg — AB (ref 26.0–34.0)
MCHC: 31.6 g/dL (ref 30.0–36.0)
MCV: 108.9 fL — AB (ref 78.0–100.0)
MONO ABS: 0.2 10*3/uL (ref 0.1–1.0)
MONOS PCT: 2 %
NEUTROS ABS: 9.5 10*3/uL — AB (ref 1.7–7.7)
Neutrophils Relative %: 90 %
PLATELETS: 81 10*3/uL — AB (ref 150–400)
RBC: 2.59 MIL/uL — ABNORMAL LOW (ref 4.22–5.81)
RDW: 15.6 % — AB (ref 11.5–15.5)
WBC: 10.6 10*3/uL — ABNORMAL HIGH (ref 4.0–10.5)

## 2014-12-26 MED ORDER — SODIUM CHLORIDE 0.9 % IV BOLUS (SEPSIS)
1000.0000 mL | Freq: Once | INTRAVENOUS | Status: AC
  Administered 2014-12-26: 1000 mL via INTRAVENOUS

## 2014-12-26 MED ORDER — ALBUTEROL SULFATE (2.5 MG/3ML) 0.083% IN NEBU
5.0000 mg | INHALATION_SOLUTION | Freq: Once | RESPIRATORY_TRACT | Status: AC
  Administered 2014-12-26: 5 mg via RESPIRATORY_TRACT
  Filled 2014-12-26: qty 6

## 2014-12-26 MED ORDER — DEXTROSE 5 % IV SOLN
1.0000 g | Freq: Once | INTRAVENOUS | Status: AC
  Administered 2014-12-26: 1 g via INTRAVENOUS
  Filled 2014-12-26: qty 10

## 2014-12-26 NOTE — ED Provider Notes (Signed)
CSN: [id number]     Arrival date & time 12/26/14  2027 History   First MD Initiated Contact with Patient 12/26/14 2117     Chief Complaint  Patient presents with  . Shortness of Breath     (Consider location/radiation/quality/duration/timing/severity/associated sxs/prior Treatment) HPI Comments: Patient is a 59 y/o male with a hx of ALL s/p allogenic SCT, COPD on chronic 3L [place], DVT and PE (previously on Xarelto, stopped after GIB in 2013), anemia, thrombocytopenia (baseline plts ~72), and depression. He presents to the emergency department today for worsening shortness of breath of the last 2 days. Patient felt more fatigued today and experienced chills this afternoon. Family reports that they took the patient's temperature by mouth at home with a reading of 102F. Patient reports noticing a worsening cough productive of white sputum. He took one albuterol treatment at home for symptoms without relief. Patient given 125 mg IV Solu-Medrol en route with EMS. Patient is on chronic Bactrim and fluconazole for ID prophylaxis given his history. He is also currently on doxycycline for presumed cellulitis to his anterior right lower extremity following trauma. This was started on 12/22/2014. Patient denies known sick contacts. He did spend a few hours in a hospital setting while his son was having back surgery 1 week ago. No other recent hospitalizations.  PCP - Dr. Livia Snellen Pulmonology - [place] at [facility] Patient's oncologist is also at [facility]  Patient is a 59 y.o. male presenting with shortness of breath. The history is provided by the patient. No language interpreter was used.  Shortness of Breath Associated symptoms: chest pain, cough and fever   Associated symptoms: no vomiting     Past Medical History  Diagnosis Date  . Leukemia-lymphoma, T-cell, acute, HTLV-I-associated ([place])   . COPD (chronic obstructive pulmonary disease) ([place])   . DVT (deep venous thrombosis) ([place])   . Personal history of  colonic  adenoma 01/22/2008  . Anemia   . Anxiety   . Arthritis   . Depression   . Gallstones   . Bowel obstruction ([facility])     constipation  . Adenomatous colon polyp     tubular  . Diverticulosis    Past Surgical History  Procedure Laterality Date  . Cholecystectomy    . Lung surgery Left   . Exploratory laparotomy    . Hernia repair      x 2  . Colonoscopy w/ biopsies     Family History  Problem Relation Age of Onset  . Prostate cancer Brother   . Clotting disorder Mother   . Diabetes Father   . Diabetes Brother     x 3  . Diabetes Sister     x 3  . Diabetes Maternal Aunt   . Heart attack Mother   . Heart attack Father    Social History  Substance Use Topics  . Smoking status: Former Smoker    Types: Cigarettes    Quit date: 07/13/1993  . Smokeless tobacco: Never Used  . Alcohol Use: No    Review of Systems  Constitutional: Positive for fever and chills.  Respiratory: Positive for cough and shortness of breath.   Cardiovascular: Positive for chest pain.  Gastrointestinal: Negative for vomiting and diarrhea.  All other systems reviewed and are negative.   Allergies  Nsaids  Home Medications   Prior to Admission medications   Medication Sig Start Date End Date Taking? Authorizing Provider  albuterol (PROVENTIL) (2.5 MG/3ML) 0.083% nebulizer solution Take 3 mLs (2.5 mg total) by  nebulization every 6 (six) hours as needed for wheezing or shortness of breath. 05/24/14  Yes Claretta Fraise, MD  ALPRAZolam Duanne Moron) 0.25 MG tablet Take 0.25 mg by mouth 2 (two) times daily.  01/25/13  Yes Historical Provider, MD  budesonide-formoterol (SYMBICORT) 160-4.5 MCG/ACT inhaler Inhale 2 puffs into the lungs every morning. 08/18/14  Yes Claretta Fraise, MD  cephALEXin (KEFLEX) 500 MG capsule Take 1 capsule (500 mg total) by mouth 3 (three) times daily. 12/18/14  Yes Timmothy Euler, MD  doxycycline (VIBRAMYCIN) 100 MG capsule Take 1 capsule (100 mg total) by mouth 2 (two)  times daily. 12/22/14  Yes Timmothy Euler, MD  fentaNYL (DURAGESIC - DOSED MCG/HR) 25 MCG/HR patch Place 25 mcg onto the skin every other day.  01/22/13  Yes Historical Provider, MD  folic acid (FOLVITE) 1 MG tablet Take 1 mg by mouth daily.  02/11/14  Yes Historical Provider, MD  lactulose (CHRONULAC) 10 GM/15ML solution Take 10 g by mouth daily as needed for mild constipation.  05/28/12  Yes Historical Provider, MD  Magnesium Chloride-Calcium 64-106 MG TBEC Take 2 tablets by mouth daily.    Yes Historical Provider, MD  Multiple Vitamins tablet Take 1 tablet by mouth daily.    Yes Historical Provider, MD  Naloxone HCl 0.4 MG/0.4ML SOAJ Inject 0.4 mg as directed daily as needed.   Yes Historical Provider, MD  pantoprazole (PROTONIX) 40 MG tablet TAKE ONE TABLET BY MOUTH ONCE DAILY IN THE MORNING 12/19/14  Yes Amy S Esterwood, PA-C  Polyethyl Glycol-Propyl Glycol (SYSTANE PRESERVATIVE FREE) 0.4-0.3 % SOLN Place 1 drop into both eyes daily as needed. For dry eyes   Yes Historical Provider, MD  Skin Protectants, Misc. (EUCERIN) cream Apply 1 application topically daily.   Yes Historical Provider, MD  SPIRIVA HANDIHALER 18 MCG inhalation capsule Place 1 capsule (18 mcg total) into inhaler and inhale daily. 12/18/14  Yes Claretta Fraise, MD  tapentadol (NUCYNTA) 50 MG TABS tablet Take 50 mg by mouth every 6 (six) hours as needed for moderate pain.  12/08/14 01/07/15 Yes Historical Provider, MD  vitamin B-12 (CYANOCOBALAMIN) 1000 MCG tablet Take 1,000 mcg by mouth.   Yes Historical Provider, MD  ONE TOUCH ULTRA TEST test strip USE ONE STRIP TO CHECK GLUCOSE ONCE DAILY IN THE MORNING FOR  FASTING  BLOOD  SUGAR 03/11/14   Lysbeth Penner, FNP  OXYGEN Inhale into the lungs. Continuous 5 LPM when up and about 3 LPM constant and at night    Historical Provider, MD  potassium chloride (MICRO-K) 10 MEQ CR capsule 30 mEq 2 (two) times daily. Takes 2 capsules daily 04/15/13 12/01/14  Historical Provider, MD  promethazine  (PHENERGAN) 25 MG tablet TAKE ONE TABLET BY MOUTH 4 TIMES DAILY AS NEEDED FOR NAUSEA Patient not taking: Reported on 12/26/2014 05/23/14   Claretta Fraise, MD   BP 110/62 mmHg  Pulse 88  Resp 18  Ht 5\' 6"  (1.676 m)  Wt 223 lb (101.152 kg)  BMI 36.01 kg/m2  SpO2 98%   Physical Exam  Constitutional: He is oriented to person, place, and time. He appears well-developed and well-nourished. No distress.  Patient appears fatigued, but nonseptic  HENT:  Head: Normocephalic and atraumatic.  Eyes: Conjunctivae and EOM are normal. No scleral icterus.  Neck: Normal range of motion.  Cardiovascular: Normal rate, regular rhythm and intact distal pulses.   Pulmonary/Chest: Effort normal. No respiratory distress. He has no wheezes. He has no rales.  Diffuse decreased breath sounds. Patient appears  mildly dyspneic without tachypnea. Chest expansion symmetric. No wheezes or rales noted. Prolonged expiratory phase. SpO2 96% on chronic 3L via [place].  Abdominal: Soft. He exhibits no distension. There is no tenderness. There is no rebound.  Soft, nontender  Musculoskeletal: Normal range of motion.  Neurological: He is alert and oriented to person, place, and time. He exhibits normal muscle tone. Coordination normal.  Skin: Skin is warm and dry. No rash noted. He is not diaphoretic. There is erythema. No pallor.  Erythema to anterior RLE, just distal to knee. Mild induration. No fluctuance. No red linear streaking. Family reports this is improving.  Psychiatric: He has a normal mood and affect. His behavior is normal.  Nursing note and vitals reviewed.   ED Course  Procedures (including critical care time) Labs Review Labs Reviewed  CBC WITH DIFFERENTIAL/PLATELET - Abnormal; Notable for the following:    WBC 10.6 (*)    RBC 2.59 (*)    Hemoglobin 8.9 (*)    HCT 28.2 (*)    MCV 108.9 (*)    MCH 34.4 (*)    RDW 15.6 (*)    Platelets 81 (*)    Neutro Abs 9.5 (*)    All other components within normal  limits  BASIC METABOLIC PANEL - Abnormal; Notable for the following:    Chloride 94 (*)    CO2 33 (*)    Glucose, Bld 144 (*)    All other components within normal limits  HEPATIC FUNCTION PANEL  MAGNESIUM    Imaging Review Dg Chest 2 View  12/26/2014  CLINICAL DATA:  Shortness of breath. EXAM: CHEST  2 VIEW COMPARISON:  June 21, 2013. FINDINGS: The heart size and mediastinal contours are within normal limits. Both lungs are clear. No pneumothorax or pleural effusion is noted. Right internal jugular Port-A-Cath is noted with distal tip at expected position of cavoatrial junction. Postsurgical changes are seen involving left rib. IMPRESSION: No active cardiopulmonary disease. Electronically Signed   By: Marijo Conception, M.D.   On: 12/26/2014 21:52   I have personally reviewed and evaluated these images and lab results as part of my medical decision-making.   EKG Interpretation   Date/Time:  Friday December 26 2014 20:56:27 EDT Ventricular Rate:  80 PR Interval:  157 QRS Duration: 96 QT Interval:  402 QTC Calculation: 464 R Axis:   53 Text Interpretation:  Sinus rhythm Abnormal R-wave progression, early  transition since last tracing no significant change Confirmed by Eulis Foster   MD, Vira Agar ([id number]) on 12/27/2014 12:20:13 AM      MDM   Final diagnoses:  CAP (community acquired pneumonia)    59 year old male with a history of ALL s/p allograft SCT, COPD on chronic O2, and PE (no longer anticoagulated 2/2 GIB in 2013) presents to the emergency department for worsening cough and shortness of breath with associated dyspnea on exertion. Patient reports a fever of 102F with chills prior to arrival. Patient has a leukocytosis today of 10.6 which is elevated from his baseline of approximately 5. Chest x-ray shows no evidence of pneumonia, but history is concerning for this. Patient does have a hx of sepsis secondary to metapneumovirus pneumonia in 06/2013; xray during this admission to [facility]  was negative. Patient given IV Rocephin as he has been on doxycycline x 4 days for a resolving cellulitis to the RLE. Will admit for observation and further care. TRH to admit.   Filed Vitals:   12/26/14 2300 12/26/14 2301 12/26/14 2330 12/27/14 0000  BP: 113/61 113/61 114/61 110/62  Pulse: 86  85 88  Resp: 19  21 18   Height:      Weight:      SpO2: 97%  97% 98%        Antonietta Breach, PA-C 12/27/14 0020  Daleen Bo, MD 12/27/14 5035

## 2014-12-26 NOTE — ED Notes (Signed)
Pt states SOB x 2 days with worsening today. Pt states 102 fever with production of white sputnum.   Hx of COPD with 3 liters continuous at home.  Pt took 1 alb treatment at home and refused more from EMS.  EMS gave 125 mg Solumedrol, 20 gauge L Wrist.

## 2014-12-27 ENCOUNTER — Other Ambulatory Visit (HOSPITAL_COMMUNITY): Payer: Medicare Other

## 2014-12-27 ENCOUNTER — Encounter (HOSPITAL_COMMUNITY): Payer: Self-pay | Admitting: Internal Medicine

## 2014-12-27 DIAGNOSIS — J9621 Acute and chronic respiratory failure with hypoxia: Secondary | ICD-10-CM | POA: Diagnosis present

## 2014-12-27 DIAGNOSIS — J441 Chronic obstructive pulmonary disease with (acute) exacerbation: Secondary | ICD-10-CM | POA: Diagnosis present

## 2014-12-27 DIAGNOSIS — J209 Acute bronchitis, unspecified: Secondary | ICD-10-CM

## 2014-12-27 DIAGNOSIS — J9620 Acute and chronic respiratory failure, unspecified whether with hypoxia or hypercapnia: Secondary | ICD-10-CM | POA: Insufficient documentation

## 2014-12-27 LAB — HEPATIC FUNCTION PANEL
ALBUMIN: 3.6 g/dL (ref 3.5–5.0)
ALK PHOS: 73 U/L (ref 38–126)
ALT: 28 U/L (ref 17–63)
AST: 35 U/L (ref 15–41)
BILIRUBIN TOTAL: 0.4 mg/dL (ref 0.3–1.2)
Total Protein: 6.3 g/dL — ABNORMAL LOW (ref 6.5–8.1)

## 2014-12-27 LAB — INFLUENZA PANEL BY PCR (TYPE A & B)
H1N1 flu by pcr: NOT DETECTED
Influenza A By PCR: NEGATIVE
Influenza B By PCR: NEGATIVE

## 2014-12-27 LAB — GLUCOSE, CAPILLARY
GLUCOSE-CAPILLARY: 239 mg/dL — AB (ref 65–99)
GLUCOSE-CAPILLARY: 143 mg/dL — AB (ref 65–99)
Glucose-Capillary: 198 mg/dL — ABNORMAL HIGH (ref 65–99)
Glucose-Capillary: 186 mg/dL — ABNORMAL HIGH (ref 65–99)
Glucose-Capillary: 135 mg/dL — ABNORMAL HIGH (ref 65–99)

## 2014-12-27 LAB — COMPREHENSIVE METABOLIC PANEL
ALT: 26 U/L (ref 17–63)
AST: 30 U/L (ref 15–41)
Albumin: 3.1 g/dL — ABNORMAL LOW (ref 3.5–5.0)
Alkaline Phosphatase: 68 U/L (ref 38–126)
Anion gap: 8 (ref 5–15)
BUN: 12 mg/dL (ref 6–20)
CALCIUM: 8.8 mg/dL — AB (ref 8.9–10.3)
CHLORIDE: 97 mmol/L — AB (ref 101–111)
CO2: 31 mmol/L (ref 22–32)
CREATININE: 0.84 mg/dL (ref 0.61–1.24)
GFR calc Af Amer: >60 mL/min (ref >60)
GFR calc non Af Amer: >60 mL/min (ref >60)
Glucose, Bld: 232 mg/dL — ABNORMAL HIGH (ref 65–99)
Potassium: 4.1 mmol/L (ref 3.5–5.1)
Sodium: 136 mmol/L (ref 135–145)
TOTAL PROTEIN: 5.7 g/dL — AB (ref 6.5–8.1)
Total Bilirubin: 0.4 mg/dL (ref 0.3–1.2)

## 2014-12-27 LAB — CBC
HEMATOCRIT: 26.9 % — AB (ref 39.0–52.0)
Hemoglobin: 8.6 g/dL — ABNORMAL LOW (ref 13.0–17.0)
MCH: 34.5 pg — ABNORMAL HIGH (ref 26.0–34.0)
MCHC: 32 g/dL (ref 30.0–36.0)
MCV: 108 fL — AB (ref 78.0–100.0)
Platelets: 71 10*3/uL — ABNORMAL LOW (ref 150–400)
RBC: 2.49 MIL/uL — ABNORMAL LOW (ref 4.22–5.81)
RDW: 15.7 % — AB (ref 11.5–15.5)
WBC: 8.3 10*3/uL (ref 4.0–10.5)

## 2014-12-27 LAB — MAGNESIUM
MAGNESIUM: 2 mg/dL (ref 1.7–2.4)
Magnesium: 2 mg/dL (ref 1.7–2.4)

## 2014-12-27 LAB — PHOSPHORUS: Phosphorus: 2.5 mg/dL (ref 2.5–4.6)

## 2014-12-27 MED ORDER — DEXTROSE 5 % IV SOLN
1.0000 g | Freq: Two times a day (BID) | INTRAVENOUS | Status: DC
  Administered 2014-12-27 – 2014-12-28 (×4): 1 g via INTRAVENOUS
  Filled 2014-12-27 (×5): qty 1

## 2014-12-27 MED ORDER — PREDNISONE 20 MG PO TABS
30.0000 mg | ORAL_TABLET | Freq: Every day | ORAL | Status: DC
  Administered 2014-12-28 – 2014-12-30 (×3): 30 mg via ORAL
  Filled 2014-12-27 (×6): qty 1

## 2014-12-27 MED ORDER — FOLIC ACID 1 MG PO TABS
1.0000 mg | ORAL_TABLET | Freq: Every day | ORAL | Status: DC
  Administered 2014-12-27 – 2014-12-30 (×4): 1 mg via ORAL
  Filled 2014-12-27 (×4): qty 1

## 2014-12-27 MED ORDER — CHLORHEXIDINE GLUCONATE 0.12 % MT SOLN
15.0000 mL | Freq: Two times a day (BID) | OROMUCOSAL | Status: DC
  Administered 2014-12-27 – 2014-12-29 (×6): 15 mL via OROMUCOSAL
  Filled 2014-12-27 (×6): qty 15

## 2014-12-27 MED ORDER — TIOTROPIUM BROMIDE MONOHYDRATE 18 MCG IN CAPS
18.0000 ug | ORAL_CAPSULE | Freq: Every day | RESPIRATORY_TRACT | Status: DC
  Administered 2014-12-28 – 2014-12-30 (×3): 18 ug via RESPIRATORY_TRACT
  Filled 2014-12-27 (×2): qty 5

## 2014-12-27 MED ORDER — LACTULOSE 10 GM/15ML PO SOLN: 10.0000 g | Freq: Every day | ORAL | Status: DC | PRN

## 2014-12-27 MED ORDER — MAGNESIUM CHLORIDE-CALCIUM 64-106 MG PO TBEC: 2.0000 | DELAYED_RELEASE_TABLET | Freq: Every day | ORAL | Status: DC

## 2014-12-27 MED ORDER — ALPRAZOLAM 0.25 MG PO TABS
0.2500 mg | ORAL_TABLET | Freq: Two times a day (BID) | ORAL | Status: DC
  Administered 2014-12-27 – 2014-12-30 (×8): 0.25 mg via ORAL
  Filled 2014-12-27 (×8): qty 1

## 2014-12-27 MED ORDER — IPRATROPIUM-ALBUTEROL 0.5-2.5 (3) MG/3ML IN SOLN
3.0000 mL | Freq: Four times a day (QID) | RESPIRATORY_TRACT | Status: DC
  Administered 2014-12-27: 3 mL via RESPIRATORY_TRACT
  Filled 2014-12-27: qty 3

## 2014-12-27 MED ORDER — VITAMIN B-12 1000 MCG PO TABS
1000.0000 ug | ORAL_TABLET | Freq: Every day | ORAL | Status: DC
  Administered 2014-12-27 – 2014-12-30 (×4): 1000 ug via ORAL
  Filled 2014-12-27 (×4): qty 1

## 2014-12-27 MED ORDER — PANTOPRAZOLE SODIUM 40 MG PO TBEC
40.0000 mg | DELAYED_RELEASE_TABLET | Freq: Every day | ORAL | Status: DC
  Administered 2014-12-27 – 2014-12-30 (×4): 40 mg via ORAL
  Filled 2014-12-27 (×4): qty 1

## 2014-12-27 MED ORDER — OXYCODONE HCL 5 MG PO TABS
10.0000 mg | ORAL_TABLET | Freq: Four times a day (QID) | ORAL | Status: DC | PRN
  Administered 2014-12-28 – 2014-12-30 (×6): 10 mg via ORAL
  Filled 2014-12-27 (×5): qty 2

## 2014-12-27 MED ORDER — GUAIFENESIN ER 600 MG PO TB12
600.0000 mg | ORAL_TABLET | Freq: Two times a day (BID) | ORAL | Status: DC
  Administered 2014-12-27 – 2014-12-30 (×8): 600 mg via ORAL
  Filled 2014-12-27 (×8): qty 1

## 2014-12-27 MED ORDER — IPRATROPIUM-ALBUTEROL 0.5-2.5 (3) MG/3ML IN SOLN: 3.0000 mL | Freq: Four times a day (QID) | RESPIRATORY_TRACT | Status: DC | PRN

## 2014-12-27 MED ORDER — MAGNESIUM SULFATE 2 GM/50ML IV SOLN
2.0000 g | Freq: Once | INTRAVENOUS | Status: AC
  Administered 2014-12-27: 2 g via INTRAVENOUS
  Filled 2014-12-27: qty 50

## 2014-12-27 MED ORDER — FENTANYL 25 MCG/HR TD PT72
25.0000 ug | MEDICATED_PATCH | TRANSDERMAL | Status: DC
  Administered 2014-12-27 – 2014-12-29 (×2): 25 ug via TRANSDERMAL
  Filled 2014-12-27 (×3): qty 1

## 2014-12-27 MED ORDER — POTASSIUM CHLORIDE CRYS ER 20 MEQ PO TBCR
20.0000 meq | EXTENDED_RELEASE_TABLET | Freq: Every day | ORAL | Status: DC
  Administered 2014-12-27 – 2014-12-30 (×4): 20 meq via ORAL
  Filled 2014-12-27 (×4): qty 1

## 2014-12-27 MED ORDER — METHYLPREDNISOLONE SODIUM SUCC 40 MG IJ SOLR
40.0000 mg | Freq: Once | INTRAMUSCULAR | Status: AC
  Administered 2014-12-27: 40 mg via INTRAVENOUS
  Filled 2014-12-27: qty 1

## 2014-12-27 MED ORDER — METHYLPREDNISOLONE SODIUM SUCC 40 MG IJ SOLR: 40.0000 mg | Freq: Two times a day (BID) | INTRAMUSCULAR | Status: DC

## 2014-12-27 MED ORDER — VANCOMYCIN HCL IN DEXTROSE 1-5 GM/200ML-% IV SOLN
1000.0000 mg | Freq: Two times a day (BID) | INTRAVENOUS | Status: DC
  Administered 2014-12-27 – 2014-12-28 (×2): 1000 mg via INTRAVENOUS
  Filled 2014-12-27 (×4): qty 200

## 2014-12-27 MED ORDER — SODIUM CHLORIDE 0.9 % IV SOLN
2000.0000 mg | INTRAVENOUS | Status: AC
  Administered 2014-12-27: 2000 mg via INTRAVENOUS
  Filled 2014-12-27: qty 2000

## 2014-12-27 MED ORDER — BUDESONIDE-FORMOTEROL FUMARATE 160-4.5 MCG/ACT IN AERO
2.0000 | INHALATION_SPRAY | Freq: Two times a day (BID) | RESPIRATORY_TRACT | Status: DC
  Administered 2014-12-27 – 2014-12-30 (×6): 2 via RESPIRATORY_TRACT
  Filled 2014-12-27: qty 6

## 2014-12-27 MED ORDER — CETYLPYRIDINIUM CHLORIDE 0.05 % MT LIQD
7.0000 mL | Freq: Two times a day (BID) | OROMUCOSAL | Status: DC
  Administered 2014-12-27 – 2014-12-29 (×5): 7 mL via OROMUCOSAL

## 2014-12-27 MED ORDER — INSULIN ASPART 100 UNIT/ML ~~LOC~~ SOLN
0.0000 [IU] | Freq: Three times a day (TID) | SUBCUTANEOUS | Status: DC
  Administered 2014-12-27 (×2): 2 [IU] via SUBCUTANEOUS
  Administered 2014-12-27: 1 [IU] via SUBCUTANEOUS
  Administered 2014-12-29: 2 [IU] via SUBCUTANEOUS

## 2014-12-27 NOTE — Progress Notes (Addendum)
PROGRESS NOTE  Derek Shepherd FXT:[id number] DOB: [birth year] DOA: 12/26/2014 PCP: Claretta Fraise, MD  HPI: 59 y.o. male with a past medical history of acute lymphocytic leukemia in remission, COPD, DVT, anemia, anxiety/depression, posterior arthritis, diverticulosis who comes to the emergency department due to progressively worse shortness of breath for the last 2 days. He states that Earlier in the week, he developed left earache, and whitish sputum productive cough associated with pleuritic chest pain and some myalgias. He subsequently developed shortness of breath in the past 48 hours which does not respond to bronchodilators given at home. The patient also reports a temperature at home of 102F.   Subjective / 24 H Interval events - feeling a bit better this morning, very very poor historian but endorsing low oxygen sats at home with ambulation for months, initially denies fever but then recalls having it, denies myalgias but then recalls that he had those too, however states that they are "chronic"  Assessment/Plan: Principal Problem:   COPD exacerbation ([place]) Active Problems:   ALL (acute lymphoid leukemia) in remission ([place])   CHF (congestive heart failure) ([place])   Right knee injury   Cellulitis of leg, right   Acute bronchitis   Acute on chronic respiratory failure with hypoxia ([place])   Acute on chronic respiratory failure (H[facility])   COPD exacerbation ([place])  - continue supplemental oxygen and bronchodilators - narrow steroids  - no blood cultures obtained on admission - sputum cultures pending - continue broad spectrum antibiotics for now - no longer wheezing this morning  ALL (acute lymphoid leukemia) in remission ([place]) - monitor CBC and follow up with oncologist as scheduled.  Thrombocytopenia - chronic, stable, avoid heparin since Plt < 100 k  CHF (congestive heart failure) (HCC)  - NOS, no echo in our system - Continue telemetry monitoring. - Monitor input and  output. - obtain 2D echo given worsening DOE  Right knee injury  Cellulitis of leg, right - antibiotics as above - improving on oral antibiotics   Chronic pain syndrome - Continue current management.   Diet: Diet heart healthy/carb modified Room service appropriate?: Yes; Fluid consistency:: Thin Fluids: None DVT Prophylaxis: SCD  Code Status: Full Code Family Communication: no family bedside  Disposition Plan: home when ready  Barriers to discharge: IV antibiotics, PT, 2D echo  Consultants:  None   Procedures:  2D echo pending   Antibiotics Vancomycin 10/21 >> [name] 10/21 >>   Studies  Dg Chest 2 View  12/26/2014  CLINICAL DATA:  Shortness of breath. EXAM: CHEST  2 VIEW COMPARISON:  June 21, 2013. FINDINGS: The heart size and mediastinal contours are within normal limits. Both lungs are clear. No pneumothorax or pleural effusion is noted. Right internal jugular Port-A-Cath is noted with distal tip at expected position of cavoatrial junction. Postsurgical changes are seen involving left rib. IMPRESSION: No active cardiopulmonary disease. Electronically Signed   By: Marijo Conception, M.D.   On: 12/26/2014 21:52   Objective  Filed Vitals:   12/27/14 0100 12/27/14 0146 12/27/14 0525 12/27/14 0809  BP: 119/58 119/69 115/59   Pulse: 82 82 81   Temp:  98.4 F (36.9 C) 98.1 F (36.7 C)   TempSrc:  Oral Oral   Resp: 15 22    Height:      Weight:      SpO2: 96% 100% 100% 96%    Intake/Output Summary (Last 24 hours) at 12/27/14 1115 Last data filed at 12/27/14 0900  Gross per 24 hour  Intake    120 ml  Output    500 ml  Net   -380 ml   Filed Weights   12/26/14 2037  Weight: 101.152 kg (223 lb)    Exam:  GENERAL: NAD  HEENT: head NCAT, no scleral icterus. Pupils round and reactive.   NECK: Supple. No LAD  LUNGS: Clear to auscultation. No wheezing or crackles  HEART: Regular rate and rhythm without murmur. 2+ pulses, no JVD, no peripheral  edema  ABDOMEN: Soft, non-distended, non-tender. Positive bowel sounds.  EXTREMITIES: Without any cyanosis or clubbing. Good muscle tone. 3-4 cm round cellulitis area with a scabbed wound on right medial aspect below the knee, no drainage, no fluctuation  NEUROLOGIC: non focal   PSYCHIATRIC: Normal mood and affect  Data Reviewed: Basic Metabolic Panel:  Recent Labs Lab 12/26/14 0059 12/26/14 2217 12/27/14 0130 12/27/14 0404  NA  --  135  --  136  K  --  3.8  --  4.1  CL  --  94*  --  97*  CO2  --  33*  --  31  GLUCOSE  --  144*  --  232*  BUN  --  11  --  12  CREATININE  --  0.87  --  0.84  CALCIUM  --  8.9  --  8.8*  MG 2.0  --  2.0  --   PHOS  --   --  2.5  --    Liver Function Tests:  Recent Labs Lab 12/26/14 0059 12/27/14 0404  AST 35 30  ALT 28 26  ALKPHOS 73 68  BILITOT 0.4 0.4  PROT 6.3* 5.7*  ALBUMIN 3.6 3.1*   CBC:  Recent Labs Lab 12/26/14 2217 12/27/14 0404  WBC 10.6* 8.3  NEUTROABS 9.5*  --   HGB 8.9* 8.6*  HCT 28.2* 26.9*  MCV 108.9* 108.0*  PLT 81* 71*   CBG:  Recent Labs Lab 12/27/14 0220 12/27/14 0756  GLUCAP 239* 198*    Scheduled Meds: . ALPRAZolam  0.25 mg Oral BID  . antiseptic oral rinse  7 mL Mouth Rinse q12n4p  . budesonide-formoterol  2 puff Inhalation BID  . cefTAZidime (FORTAZ)  IV  1 g Intravenous Q12H  . chlorhexidine  15 mL Mouth Rinse BID  . fentaNYL  25 mcg Transdermal Q48H  . folic acid  1 mg Oral Daily  . guaiFENesin  600 mg Oral BID  . insulin aspart  0-9 Units Subcutaneous TID WC  . pantoprazole  40 mg Oral Daily  . potassium chloride  20 mEq Oral Daily  . [START ON 12/28/2014] predniSONE  30 mg Oral Q breakfast  . [START ON 12/28/2014] tiotropium  18 mcg Inhalation Daily  . vancomycin  1,000 mg Intravenous Q12H  . vitamin B-12  1,000 mcg Oral Daily   Continuous Infusions:    Marzetta Board, MD [facility] Pager [phone]. If 7 PM - 7 AM, please contact night-coverage at [email or web address],  password [facility] 12/27/2014, 11:15 AM

## 2014-12-27 NOTE — H&P (Signed)
[facility] History and Physical  Derek Shepherd AOZ:[id number] DOB: 06-04-1955 DOA: 12/26/2014  Referring physician: Antonietta Breach, PA-C PCP: Derek Fraise, MD   Chief Complaint: Shortness of breath.  HPI: Derek Shepherd is a 59 y.o. male with a past medical history of acute lymphocytic leukemia in remission, COPD, DVT, anemia, anxiety/depression, posterior arthritis, diverticulosis who comes to the emergency department due to progressively worse shortness of breath for the last 2 days. He states that Earlier in the week, he developed left earache, and whitish sputum productive cough associated with pleuritic chest pain and some myalgias. He subsequently developed shortness of breath in the past 48 hours which does not respond to bronchodilators given at home. The patient also reports a temperature at home of 102F.   The patient also has been treated for right lower extremity cellulitis with oral antibiotics for several days, he has been exposed to a hospital setting and has taken Bactrim in and fluconazole for prophylaxis in the past. When seen in the emergency department, he was in no acute distress and stated that his symptoms were better.  Review of Systems:  Constitutional:  Positive night sweats, Fevers, chills, fatigue.  No weight loss,  HEENT:  No headaches, Difficulty swallowing,Tooth/dental problems,Sore throat,  No sneezing, itching, ear ache, nasal congestion, post nasal drip,  Cardio-vascular:  No chest pain, Orthopnea, PND, swelling in lower extremities, anasarca, dizziness, palpitations  GI:  Positive frequent constipation. No heartburn, indigestion, abdominal pain, nausea, vomiting, diarrhea,loss of appetite  Resp:  Positive dyspnea, productive cough of whitish sputum, wheezing. No hemoptysis.  Skin:  Positive erythema and tenderness on right knee area. GU:  no dysuria, change in color of urine, no urgency or frequency. No flank pain.   Musculoskeletal:  No joint pain or swelling. No decreased range of motion. No back pain.  Psych:  No change in mood or affect. No depression or anxiety. No memory loss.   Past Medical History  Diagnosis Date  . Leukemia-lymphoma, T-cell, acute, HTLV-I-associated ([place])   . COPD (chronic obstructive pulmonary disease) ([place])   . DVT (deep venous thrombosis) ([place])   . Personal history of colonic adenoma 01/22/2008  . Anemia   . Anxiety   . Arthritis   . Depression   . Gallstones   . Bowel obstruction (HCC)     constipation  . Adenomatous colon polyp     tubular  . Diverticulosis    Past Surgical History  Procedure Laterality Date  . Cholecystectomy    . Lung surgery Left   . Exploratory laparotomy    . Hernia repair      x 2  . Colonoscopy w/ biopsies     Social History:  reports that he quit smoking about 21 years ago. His smoking use included Cigarettes. He has never used smokeless tobacco. He reports that he does not drink alcohol or use illicit drugs.  Allergies  Allergen Reactions  . Nsaids Shortness Of Breath    Family History  Problem Relation Age of Onset  . Prostate cancer Brother   . Clotting disorder Mother   . Diabetes Father   . Diabetes Brother     x 3  . Diabetes Sister     x 3  . Diabetes Maternal Aunt   . Heart attack Mother   . Heart attack Father     Prior to Admission medications   Medication Sig Start Date End Date Taking? Authorizing Provider  albuterol (PROVENTIL) (2.5 MG/3ML) 0.083% nebulizer solution Take 3  mLs (2.5 mg total) by nebulization every 6 (six) hours as needed for wheezing or shortness of breath. 05/24/14  Yes Derek Fraise, MD  ALPRAZolam Duanne Moron) 0.25 MG tablet Take 0.25 mg by mouth 2 (two) times daily.  01/25/13  Yes Historical Provider, MD  budesonide-formoterol (SYMBICORT)  160-4.5 MCG/ACT inhaler Inhale 2 puffs into the lungs every morning. 08/18/14  Yes Derek Fraise, MD  cephALEXin (KEFLEX) 500 MG capsule Take 1 capsule (500 mg total) by mouth 3 (three) times daily. 12/18/14  Yes Timmothy Euler, MD  doxycycline (VIBRAMYCIN) 100 MG capsule Take 1 capsule (100 mg total) by mouth 2 (two) times daily. 12/22/14  Yes Timmothy Euler, MD  fentaNYL (DURAGESIC - DOSED MCG/HR) 25 MCG/HR patch Place 25 mcg onto the skin every other day.  01/22/13  Yes Historical Provider, MD  folic acid (FOLVITE) 1 MG tablet Take 1 mg by mouth daily.  02/11/14  Yes Historical Provider, MD  lactulose (CHRONULAC) 10 GM/15ML solution Take 10 g by mouth daily as needed for mild constipation.  05/28/12  Yes Historical Provider, MD  Magnesium Chloride-Calcium 64-106 MG TBEC Take 2 tablets by mouth daily.    Yes Historical Provider, MD  Multiple Vitamins tablet Take 1 tablet by mouth daily.    Yes Historical Provider, MD  Naloxone HCl 0.4 MG/0.4ML SOAJ Inject 0.4 mg as directed daily as needed.   Yes Historical Provider, MD  pantoprazole (PROTONIX) 40 MG tablet TAKE ONE TABLET BY MOUTH ONCE DAILY IN THE MORNING 12/19/14  Yes Amy S Esterwood, PA-C  Polyethyl Glycol-Propyl Glycol (SYSTANE PRESERVATIVE FREE) 0.4-0.3 % SOLN Place 1 drop into both eyes daily as needed. For dry eyes   Yes Historical Provider, MD  Skin Protectants, Misc. (EUCERIN) cream Apply 1 application topically daily.   Yes Historical Provider, MD  SPIRIVA HANDIHALER 18 MCG inhalation capsule Place 1 capsule (18 mcg total) into inhaler and inhale daily. 12/18/14  Yes Derek Fraise, MD  tapentadol (NUCYNTA) 50 MG TABS tablet Take 50 mg by mouth every 6 (six) hours as needed for moderate pain.  12/08/14 01/07/15 Yes Historical Provider, MD  vitamin B-12 (CYANOCOBALAMIN) 1000 MCG tablet Take 1,000 mcg by mouth.   Yes Historical Provider, MD  ONE TOUCH  ULTRA TEST test strip USE ONE STRIP TO CHECK GLUCOSE ONCE DAILY IN THE MORNING FOR FASTING BLOOD SUGAR 03/11/14   Lysbeth Penner, FNP  OXYGEN Inhale into the lungs. Continuous 5 LPM when up and about 3 LPM constant and at night    Historical Provider, MD  potassium chloride (MICRO-K) 10 MEQ CR capsule 30 mEq 2 (two) times daily. Takes 2 capsules daily 04/15/13 12/01/14  Historical Provider, MD  promethazine (PHENERGAN) 25 MG tablet TAKE ONE TABLET BY MOUTH 4 TIMES DAILY AS NEEDED FOR NAUSEA Patient not taking: Reported on 12/26/2014 05/23/14   Derek Fraise, MD   Physical Exam: Filed Vitals:   12/26/14 2330 12/27/14 0000 12/27/14 0030 12/27/14 0100  BP: 114/61 110/62 125/65 119/58  Pulse: 85 88 86 82  Resp: 21 18 17 15   Height:      Weight:      SpO2: 97% 98% 97% 96%    Wt Readings from Last 3 Encounters:  12/26/14 101.152 kg (223 lb)  12/22/14 101.696 kg (224 lb 3.2 oz)  12/18/14 99.973 kg (220 lb 6.4 oz)     General: Appears calm and comfortable  Eyes: PERRL, normal lids, irises & conjunctiva  ENT: grossly normal hearing, lips & tongue  Neck: no  LAD, masses or thyromegaly  Cardiovascular: RRR, no m/r/g. No LE edema.  Telemetry: SR, no arrhythmias   Respiratory: Bilateral wheezing and rhonchi.  Abdomen: soft, ntnd  Skin: Positive erythema and induration on right knee area.  Musculoskeletal: grossly normal tone BUE/BLE  Psychiatric: grossly normal mood and affect, speech fluent and appropriate  Neurologic: grossly non-focal.          Labs on Admission:  Basic Metabolic Panel:  Last Labs      Recent Labs Lab 12/26/14 2217  NA 135  K 3.8  CL 94*  CO2 33*  GLUCOSE 144*  BUN 11  CREATININE 0.87  CALCIUM 8.9     CBC:  Last Labs      Recent Labs Lab 12/26/14 2217  WBC 10.6*  NEUTROABS 9.5*  HGB 8.9*  HCT 28.2*  MCV 108.9*  PLT 81*       Radiological Exams on Admission:  Imaging Results (Last 48 hours)    Dg Chest 2 View  12/26/2014 CLINICAL DATA: Shortness of breath. EXAM: CHEST 2 VIEW COMPARISON: June 21, 2013. FINDINGS: The heart size and mediastinal contours are within normal limits. Both lungs are clear. No pneumothorax or pleural effusion is noted. Right internal jugular Port-A-Cath is noted with distal tip at expected position of cavoatrial junction. Postsurgical changes are seen involving left rib. IMPRESSION: No active cardiopulmonary disease. Electronically Signed By: Marijo Conception, M.D. On: 12/26/2014 21:52     EKG: Independently reviewed. Vent. rate 80 BPM PR interval 157 ms QRS duration 96 ms QT/QTc 402/464 ms P-R-T axes 69 53 59 Sinus rhythm Abnormal R-wave progression, early transition  Assessment/Plan Principal Problem:    COPD exacerbation (HCC)    Acute bronchitis Continue supplemental oxygen and bronchodilators. Limited use of glucocorticoids. Follow-up blood cultures. Check sputum Gram stain, culture and sensitivity. Start Vancomycin and ceftazidime. I will defer the use of Zosyn given the patient's history of CHF and thrombocytopenia.   Active Problems:  ALL (acute lymphoid leukemia) in remission ([place]) Monitor CBC and follow up with oncologist as scheduled.   CHF (congestive heart failure) ([place]) Continue telemetry monitoring. Monitor input and output.   Right knee injury  Cellulitis of leg, right  Discontinue oral antibiotics. Current IV antibiotic coverage should be effective for this.     Chronic pain syndrome. Continue current management.       Code Status: Full code. DVT Prophylaxis: SCDs Family Communication:  Disposition Plan: Admit for IV antibiotic therapy, supplemental oxygen and bronchodilators.  Time spent: Over 70 minutes were spent in the process of this admission.   Reubin [name] [facility] Pager [phone].

## 2014-12-27 NOTE — Progress Notes (Signed)
Admission Note  Patient arrived to floor in room [phone] via bed, transferred from ED. Patient alert and oriented X 4. Vitals signs  Oral temperature 98.4 F (36.9 C)     Blood pressure 119/69       Pulse 82       RR 22       SpO2 100 % on room air. Denies pain. Skin intact, no pressure ulcer noted in sacral area, abrasion with swelling notch noted on right medial knee.  Patient's ID armband verified with patient/ family, and in place. Information packet given to patient/ family. Fall risk assessed, SR up X2, patient/ family able to verbalize understanding of risks associated with falls and to call nurse or staff to assist before getting out of bed. Patient/ family oriented to room and equipment. Call bell within reach.

## 2014-12-27 NOTE — Progress Notes (Signed)
ANTIBIOTIC CONSULT NOTE - INITIAL  Pharmacy Consult for Vancomycin Indication: cellulitis, r/o PNA  Allergies  Allergen Reactions  . Nsaids Shortness Of Breath    Patient Measurements: Height: 5\' 6"  (167.6 cm) Weight: 223 lb (101.152 kg) IBW/kg (Calculated) : 63.8  Vital Signs: Temp: 98.4 F (36.9 C) (10/22 0146) Temp Source: Oral (10/22 0146) BP: 119/69 mmHg (10/22 0146) Pulse Rate: 82 (10/22 0146) Intake/Output from previous day:   Intake/Output from this shift:    Labs:  Recent Labs  12/26/14 2217  WBC 10.6*  HGB 8.9*  PLT 81*  CREATININE 0.87   Estimated Creatinine Clearance: 104.4 mL/min (by C-G formula based on Cr of 0.87). No results for input(s): VANCOTROUGH, VANCOPEAK, VANCORANDOM, GENTTROUGH, GENTPEAK, GENTRANDOM, TOBRATROUGH, TOBRAPEAK, TOBRARND, AMIKACINPEAK, AMIKACINTROU, AMIKACIN in the last 72 hours.   Microbiology: No results found for this or any previous visit (from the past 720 hour(s)).  Medical History: Past Medical History  Diagnosis Date  . Leukemia-lymphoma, T-cell, acute, HTLV-I-associated ([place])   . COPD (chronic obstructive pulmonary disease) ([place])   . DVT (deep venous thrombosis) ([place])   . Personal history of colonic  adenoma 01/22/2008  . Anemia   . Anxiety   . Arthritis   . Depression   . Gallstones   . Bowel obstruction (HCC)     constipation  . Adenomatous colon polyp     tubular  . Diverticulosis     Medications:  Prescriptions prior to admission  Medication Sig Dispense Refill Last Dose  . albuterol (PROVENTIL) (2.5 MG/3ML) 0.083% nebulizer solution Take 3 mLs (2.5 mg total) by nebulization every 6 (six) hours as needed for wheezing or shortness of breath. 75 mL 4 12/26/2014 at Unknown time  . ALPRAZolam (XANAX) 0.25 MG tablet Take 0.25 mg by mouth 2 (two) times daily.    12/25/2014 at Unknown time  . budesonide-formoterol (SYMBICORT) 160-4.5 MCG/ACT inhaler Inhale 2 puffs into the lungs every morning. 1 Inhaler 2  12/26/2014 at Unknown time  . cephALEXin (KEFLEX) 500 MG capsule Take 1 capsule (500 mg total) by mouth 3 (three) times daily. 21 capsule 0 12/26/2014 at Unknown time  . doxycycline (VIBRAMYCIN) 100 MG capsule Take 1 capsule (100 mg total) by mouth 2 (two) times daily. 20 capsule 0 12/26/2014 at Unknown time  . fentaNYL (DURAGESIC - DOSED MCG/HR) 25 MCG/HR patch Place 25 mcg onto the skin every other day.    12/25/2014 at Unknown time  . folic acid (FOLVITE) 1 MG tablet Take 1 mg by mouth daily.    12/26/2014 at Unknown time  . lactulose (CHRONULAC) 10 GM/15ML solution Take 10 g by mouth daily as needed for mild constipation.    12/26/2014 at Unknown time  . Magnesium Chloride-Calcium 64-106 MG TBEC Take 2 tablets by mouth daily.    12/26/2014 at Unknown time  . Multiple Vitamins tablet Take 1 tablet by mouth daily.    12/26/2014 at Unknown time  . Naloxone HCl 0.4 MG/0.4ML SOAJ Inject 0.4 mg as directed daily as needed.   unknown at unknown  . pantoprazole (PROTONIX) 40 MG tablet TAKE ONE TABLET BY MOUTH ONCE DAILY IN THE MORNING 30 tablet 0 12/26/2014 at Unknown time  . Polyethyl Glycol-Propyl Glycol (SYSTANE PRESERVATIVE FREE) 0.4-0.3 % SOLN Place 1 drop into both eyes daily as needed. For dry eyes   12/25/2014 at Unknown time  . Skin Protectants, Misc. (EUCERIN) cream Apply 1 application topically daily.   unknown at Unknown time  . SPIRIVA HANDIHALER 18 MCG inhalation capsule  Place 1 capsule (18 mcg total) into inhaler and inhale daily. 30 capsule 5 12/26/2014 at Unknown time  . tapentadol (NUCYNTA) 50 MG TABS tablet Take 50 mg by mouth every 6 (six) hours as needed for moderate pain.    12/26/2014 at Unknown time  . vitamin B-12 (CYANOCOBALAMIN) 1000 MCG tablet Take 1,000 mcg by mouth.   12/26/2014 at Unknown time  . ONE TOUCH ULTRA TEST test strip USE ONE STRIP TO CHECK GLUCOSE ONCE DAILY IN THE MORNING FOR  FASTING  BLOOD  SUGAR 100 each 0 Taking  . OXYGEN Inhale into the lungs. Continuous 5  LPM when up and about 3 LPM constant and at night   Taking  . potassium chloride (MICRO-K) 10 MEQ CR capsule 30 mEq 2 (two) times daily. Takes 2 capsules daily   Taking  . promethazine (PHENERGAN) 25 MG tablet TAKE ONE TABLET BY MOUTH 4 TIMES DAILY AS NEEDED FOR NAUSEA (Patient not taking: Reported on 12/26/2014) 30 tablet 2 Not Taking at Unknown time   Assessment: 59 y.o. M presents with SOB. Pt also with resolving RLE cellulitis - was on doxycycline and cephalexin PTA for this. To begin Ceftazidime and Vancomycin for cellulitis and r/o PNA. Afeb. WBC elevated to 10.6. Estimated normalized CrCl 95 ml/min  Goal of Therapy:  Vancomycin trough level 10-15 mcg/ml  Plan:  Vancomycin 2gm IV now then 1gm IV q12h  Will f/u micro data, renal function, and pt's clinical condition Vanc trough at Css in obese pt  Sherlon Handing, PharmD, BCPS Clinical pharmacist, pager [phone] 12/27/2014,2:00 AM

## 2014-12-28 ENCOUNTER — Observation Stay (HOSPITAL_BASED_OUTPATIENT_CLINIC_OR_DEPARTMENT_OTHER): Payer: Medicare Other

## 2014-12-28 ENCOUNTER — Other Ambulatory Visit (HOSPITAL_COMMUNITY): Payer: Medicare Other

## 2014-12-28 DIAGNOSIS — C9101 Acute lymphoblastic leukemia, in remission: Secondary | ICD-10-CM | POA: Diagnosis not present

## 2014-12-28 DIAGNOSIS — J441 Chronic obstructive pulmonary disease with (acute) exacerbation: Secondary | ICD-10-CM | POA: Diagnosis not present

## 2014-12-28 DIAGNOSIS — R0600 Dyspnea, unspecified: Secondary | ICD-10-CM | POA: Diagnosis not present

## 2014-12-28 DIAGNOSIS — L03115 Cellulitis of right lower limb: Secondary | ICD-10-CM | POA: Diagnosis not present

## 2014-12-28 DIAGNOSIS — I5041 Acute combined systolic (congestive) and diastolic (congestive) heart failure: Secondary | ICD-10-CM | POA: Insufficient documentation

## 2014-12-28 DIAGNOSIS — J9621 Acute and chronic respiratory failure with hypoxia: Secondary | ICD-10-CM | POA: Diagnosis not present

## 2014-12-28 DIAGNOSIS — J209 Acute bronchitis, unspecified: Secondary | ICD-10-CM | POA: Diagnosis not present

## 2014-12-28 LAB — BASIC METABOLIC PANEL
ANION GAP: 6 (ref 5–15)
BUN: 14 mg/dL (ref 6–20)
CO2: 33 mmol/L — ABNORMAL HIGH (ref 22–32)
Calcium: 9.3 mg/dL (ref 8.9–10.3)
Chloride: 102 mmol/L (ref 101–111)
Creatinine, Ser: 0.91 mg/dL (ref 0.61–1.24)
GFR calc Af Amer: >60 mL/min (ref >60)
GFR calc non Af Amer: >60 mL/min (ref >60)
GLUCOSE: 119 mg/dL — AB (ref 65–99)
POTASSIUM: 4.5 mmol/L (ref 3.5–5.1)
SODIUM: 141 mmol/L (ref 135–145)

## 2014-12-28 LAB — CBC
HCT: 26.3 % — ABNORMAL LOW (ref 39.0–52.0)
Hemoglobin: 8.2 g/dL — ABNORMAL LOW (ref 13.0–17.0)
MCH: 34.2 pg — AB (ref 26.0–34.0)
MCHC: 31.2 g/dL (ref 30.0–36.0)
MCV: 109.6 fL — AB (ref 78.0–100.0)
PLATELETS: 72 10*3/uL — AB (ref 150–400)
RBC: 2.4 MIL/uL — AB (ref 4.22–5.81)
RDW: 16.3 % — AB (ref 11.5–15.5)
WBC: 9.5 10*3/uL (ref 4.0–10.5)

## 2014-12-28 LAB — GLUCOSE, CAPILLARY
GLUCOSE-CAPILLARY: 126 mg/dL — AB (ref 65–99)
Glucose-Capillary: 105 mg/dL — ABNORMAL HIGH (ref 65–99)

## 2014-12-28 LAB — TROPONIN I: Troponin I: <0.03 ng/mL (ref <0.031)

## 2014-12-28 MED ORDER — NAPHAZOLINE HCL 0.1 % OP SOLN
1.0000 [drp] | Freq: Four times a day (QID) | OPHTHALMIC | Status: DC | PRN
  Filled 2014-12-28: qty 15

## 2014-12-28 MED ORDER — PERFLUTREN LIPID MICROSPHERE
1.0000 mL | INTRAVENOUS | Status: AC | PRN
Start: 2014-12-28 — End: 2014-12-28
  Administered 2014-12-28 (×2): 2 mL via INTRAVENOUS
  Filled 2014-12-28: qty 10

## 2014-12-28 MED ORDER — DEXTROSE 5 % IV SOLN
1.0000 g | Freq: Three times a day (TID) | INTRAVENOUS | Status: DC
  Filled 2014-12-28 (×2): qty 1

## 2014-12-28 MED ORDER — ALBUTEROL SULFATE (2.5 MG/3ML) 0.083% IN NEBU
2.5000 mg | INHALATION_SOLUTION | Freq: Three times a day (TID) | RESPIRATORY_TRACT | Status: DC
  Administered 2014-12-28 – 2014-12-30 (×4): 2.5 mg via RESPIRATORY_TRACT
  Filled 2014-12-28 (×7): qty 3

## 2014-12-28 MED ORDER — FLUTICASONE PROPIONATE 50 MCG/ACT NA SUSP
1.0000 | Freq: Every day | NASAL | Status: DC
  Administered 2014-12-29 – 2014-12-30 (×2): 1 via NASAL
  Filled 2014-12-28: qty 16

## 2014-12-28 MED ORDER — LEVOFLOXACIN 750 MG PO TABS
750.0000 mg | ORAL_TABLET | Freq: Every day | ORAL | Status: DC
  Administered 2014-12-28 – 2014-12-30 (×3): 750 mg via ORAL
  Filled 2014-12-28 (×2): qty 1

## 2014-12-28 NOTE — Progress Notes (Signed)
  Echocardiogram 2D Echocardiogram has been performed.  Bobbye Charleston 12/28/2014, 10:40 AM

## 2014-12-28 NOTE — Evaluation (Signed)
Physical Therapy Evaluation Patient Details Name: Derek Shepherd MRN: [id number] DOB: [birth year] Today's Date: 12/28/2014   History of Present Illness  59 y.o. male with a past medical history of acute lymphocytic leukemia in remission, COPD, DVT, anemia, anxiety/depression who comes to the emergency department due to progressively worse shortness of breath for the last 2 days.  Clinical Impression  Pt admitted with/for SOB, Copd exac, CHF.  Pt currently limited functionally due to the problems listed below.  (see problems list.)  Pt will benefit from PT to maximize function and safety to be able to get home safely with available assist of family.     Follow Up Recommendations Home health PT;Supervision - Intermittent    Equipment Recommendations  None recommended by PT    Recommendations for Other Services       Precautions / Restrictions Precautions Precaution Comments: watch O2 sats      Mobility  Bed Mobility               General bed mobility comments: OOB already  Transfers Overall transfer level: Needs assistance   Transfers: Sit to/from Stand Sit to Stand: Supervision            Ambulation/Gait Ambulation/Gait assistance: Supervision Ambulation Distance (Feet): 300 Feet (with 4 standing rest breaks)   Gait Pattern/deviations: Step-through pattern Gait velocity: able to speed up to cue. Gait velocity interpretation: at or above normal speed for age/gender General Gait Details: generally steady, pulls portable O2 tank without effort.  Short distance amb. sats on 3L [place] remain low 90's  EHR mid to upper 90's.  Longer distance amb before rest, sats drop into the  upper 80's  Stairs            Wheelchair Mobility    Modified Rankin (Stroke Patients Only)       Balance Overall balance assessment: No apparent balance deficits (not formally assessed)                                           Pertinent Vitals/Pain Pain  Assessment: No/denies pain    Home Living Family/patient expects to be discharged to:: Private residence Living Arrangements: Spouse/significant other Available Help at Discharge: Family;Available 24 hours/day Type of Home: House Home Access: Stairs to enter Entrance Stairs-Rails: Psychiatric nurse of Steps: several Home Layout: One level Home Equipment:  (concentrater and portable on demand tank)      Prior Function Level of Independence: Independent               Hand Dominance        Extremity/Trunk Assessment   Upper Extremity Assessment: Overall WFL for tasks assessed           Lower Extremity Assessment: Overall WFL for tasks assessed (proximal LE weakness and mild truncal weakness)         Communication   Communication: No difficulties  Cognition Arousal/Alertness: Awake/alert Behavior During Therapy: WFL for tasks assessed/performed Overall Cognitive Status: Within Functional Limits for tasks assessed                      General Comments      Exercises        Assessment/Plan    PT Assessment Patient needs continued PT services  PT Diagnosis Generalized weakness;Other (comment) (decrease activity tolererance)   PT Problem List Decreased strength;Decreased activity  tolerance;Decreased mobility;Cardiopulmonary status limiting activity  PT Treatment Interventions Gait training;Stair training;Functional mobility training;Therapeutic activities;Patient/family education   PT Goals (Current goals can be found in the Care Plan section) Acute Rehab PT Goals Patient Stated Goal: Like to be able to do more. PT Goal Formulation: With patient/family Time For Goal Achievement: 01/04/15 Potential to Achieve Goals: Good    Frequency Min 3X/week   Barriers to discharge        Co-evaluation               End of Session Equipment Utilized During Treatment: Oxygen Activity Tolerance: Patient tolerated treatment  well;Patient limited by fatigue Patient left: with call bell/phone within reach;with family/visitor present;Other (comment) (sitting EOB) Nurse Communication: Mobility status    Functional Assessment Tool Used: clinical judgement Functional Limitation: Mobility: Walking and moving around Mobility: Walking and Moving Around Current Status (I1030): At least 1 percent but less than 20 percent impaired, limited or restricted Mobility: Walking and Moving Around Goal Status [phone]): At least 1 percent but less than 20 percent impaired, limited or restricted    Time: 1351-1415 PT Time Calculation (min) (ACUTE ONLY): 24 min   Charges:   PT Evaluation $Initial PT Evaluation Tier I: 1 Procedure PT Treatments $Gait Training: 8-22 mins   PT G Codes:   PT G-Codes **NOT FOR INPATIENT CLASS** Functional Assessment Tool Used: clinical judgement Functional Limitation: Mobility: Walking and moving around Mobility: Walking and Moving Around Current Status (O8875): At least 1 percent but less than 20 percent impaired, limited or restricted Mobility: Walking and Moving Around Goal Status [phone]): At least 1 percent but less than 20 percent impaired, limited or restricted    Mickenzie Stolar, Tessie Fass 12/28/2014, 2:37 PM 12/28/2014  Donnella Sham, PT [phone]  (pager)

## 2014-12-28 NOTE — Consult Note (Signed)
Reason for Consult:   Abnormal echo  Requesting Physician: [facility] Primary Cardiologist [place]  HPI:  59 y/o male followed at [facility] with a history of ALL, s/p BMP 2011, severe COPD per pulmonologist records, and " heart failure". He has had several echos, all showing normal LVF, some suggesting diastolic dysfunction. He has no history of CAD or MI. He has never had a cath. He was admitted here 12/26/14 with several days of fatigue, DOE, and fever. An echo was done today, initially read as an EF of 35%. This was reviewed by Dr Martinique and Dr Sallyanne Kuster and felt to be more like 50%. The pt has had some chest pain but it is Rt sided and pleuritic. His EKG is normal.   PMHx:  Past Medical History  Diagnosis Date  . Leukemia-lymphoma, T-cell, acute, HTLV-I-associated ([place])   . COPD (chronic obstructive pulmonary disease) ([place])   . DVT (deep venous thrombosis) ([place])   . Personal history of colonic  adenoma 01/22/2008  . Anemia   . Anxiety   . Arthritis   . Depression   . Gallstones   . Bowel obstruction (HCC)     constipation  . Adenomatous colon polyp     tubular  . Diverticulosis     Past Surgical History  Procedure Laterality Date  . Cholecystectomy    . Lung surgery Left   . Exploratory laparotomy    . Hernia repair      x 2  . Colonoscopy w/ biopsies      SOCHx:  reports that he quit smoking about 21 years ago. His smoking use included Cigarettes. He has never used smokeless tobacco. He reports that he does not drink alcohol or use illicit drugs.  FAMHx: Family History  Problem Relation Age of Onset  . Prostate cancer Brother   . Clotting disorder Mother   . Diabetes Father   . Diabetes Brother     x 3  . Diabetes Sister     x 3  . Diabetes Maternal Aunt   . Heart attack Mother   . Heart attack Father     ALLERGIES: Allergies  Allergen Reactions  . Nsaids Shortness Of Breath    ROS: Review of Systems: General: negative for  weight gain Cardiovascular: negative for edema, orthopnea, palpitations, paroxysmal nocturnal dyspnea  HEENT: negative for any visual disturbances, blindness, glaucoma Dermatological: negative for rash Respiratory: negative for cough, hemoptysis, or wheezing Urologic: negative for hematuria or dysuria Abdominal: negative for nausea, vomiting, diarrhea, bright red blood per rectum, melena, or hematemesis Neurologic: negative for visual changes, syncope, or dizziness Musculoskeletal: negative for back pain, joint pain, or swelling Psych: cooperative and appropriate All other systems reviewed and are otherwise negative except as noted above.   HOME MEDICATIONS: Prior to Admission medications   Medication Sig Start Date End Date Taking? Authorizing Provider  albuterol (PROVENTIL) (2.5 MG/3ML) 0.083% nebulizer solution Take 3 mLs (2.5 mg total) by nebulization every 6 (six) hours as needed for wheezing or shortness of breath. 05/24/14  Yes Claretta Fraise, MD  ALPRAZolam Duanne Moron) 0.25 MG tablet Take 0.25 mg by mouth 2 (two) times daily.  01/25/13  Yes Historical Provider, MD  budesonide-formoterol (SYMBICORT) 160-4.5 MCG/ACT inhaler Inhale 2 puffs into the lungs every morning. 08/18/14  Yes Claretta Fraise, MD  cephALEXin (KEFLEX) 500 MG capsule Take 1 capsule (500 mg total) by mouth 3 (three) times daily. 12/18/14  Yes Timmothy Euler, MD  doxycycline (  VIBRAMYCIN) 100 MG capsule Take 1 capsule (100 mg total) by mouth 2 (two) times daily. 12/22/14  Yes Timmothy Euler, MD  fentaNYL (DURAGESIC - DOSED MCG/HR) 25 MCG/HR patch Place 25 mcg onto the skin every other day.  01/22/13  Yes Historical Provider, MD  folic acid (FOLVITE) 1 MG tablet Take 1 mg by mouth daily.  02/11/14  Yes Historical Provider, MD  lactulose (CHRONULAC) 10 GM/15ML solution Take 10 g by mouth daily as needed for mild constipation.  05/28/12  Yes Historical Provider, MD  Magnesium Chloride-Calcium 64-106 MG TBEC Take 2 tablets by  mouth daily.    Yes Historical Provider, MD  Multiple Vitamins tablet Take 1 tablet by mouth daily.    Yes Historical Provider, MD  Naloxone HCl 0.4 MG/0.4ML SOAJ Inject 0.4 mg as directed daily as needed.   Yes Historical Provider, MD  pantoprazole (PROTONIX) 40 MG tablet TAKE ONE TABLET BY MOUTH ONCE DAILY IN THE MORNING 12/19/14  Yes Amy S Esterwood, PA-C  Polyethyl Glycol-Propyl Glycol (SYSTANE PRESERVATIVE FREE) 0.4-0.3 % SOLN Place 1 drop into both eyes daily as needed. For dry eyes   Yes Historical Provider, MD  Skin Protectants, Misc. (EUCERIN) cream Apply 1 application topically daily.   Yes Historical Provider, MD  SPIRIVA HANDIHALER 18 MCG inhalation capsule Place 1 capsule (18 mcg total) into inhaler and inhale daily. 12/18/14  Yes Claretta Fraise, MD  tapentadol (NUCYNTA) 50 MG TABS tablet Take 50 mg by mouth every 6 (six) hours as needed for moderate pain.  12/08/14 01/07/15 Yes Historical Provider, MD  vitamin B-12 (CYANOCOBALAMIN) 1000 MCG tablet Take 1,000 mcg by mouth.   Yes Historical Provider, MD  ONE TOUCH ULTRA TEST test strip USE ONE STRIP TO CHECK GLUCOSE ONCE DAILY IN THE MORNING FOR  FASTING  BLOOD  SUGAR 03/11/14   Lysbeth Penner, FNP  OXYGEN Inhale into the lungs. Continuous 5 LPM when up and about 3 LPM constant and at night    Historical Provider, MD  potassium chloride (MICRO-K) 10 MEQ CR capsule 30 mEq 2 (two) times daily. Takes 2 capsules daily 04/15/13 12/01/14  Historical Provider, MD  promethazine (PHENERGAN) 25 MG tablet TAKE ONE TABLET BY MOUTH 4 TIMES DAILY AS NEEDED FOR NAUSEA Patient not taking: Reported on 12/26/2014 05/23/14   Claretta Fraise, MD    HOSPITAL MEDICATIONS: I have reviewed the patient's current medications.  VITALS: Blood pressure 117/57, pulse 51, temperature 97.9 F (36.6 C), temperature source Oral, resp. rate 18, height 5\' 6"  (1.676 m), weight 223 lb (101.152 kg), SpO2 100 %.  PHYSICAL EXAM: General appearance: alert, cooperative, no  distress and mildly obese Neck: no carotid bruit and no JVD Lungs: decreased breath sounds, no rales or wheezing Heart: regular rate and rhythm Abdomen: obese, midline surgical scar, small umbilcal hernia Extremities: trace edema, small area of redness Rt knee Pulses: 2+ and symmetric Skin: cool, pale, dry Neurologic: Grossly normal  LABS: Results for orders placed or performed during the hospital encounter of 12/26/14 (from the past 24 hour(s))  Glucose, capillary     Status: Abnormal   Collection Time: 12/27/14  5:17 PM  Result Value Ref Range   Glucose-Capillary 143 (H) 65 - 99 mg/dL  Glucose, capillary     Status: Abnormal   Collection Time: 12/27/14 10:26 PM  Result Value Ref Range   Glucose-Capillary 135 (H) 65 - 99 mg/dL   Comment 1 Notify RN    Comment 2 Document in Chart   CBC  Status: Abnormal   Collection Time: 12/28/14  4:05 AM  Result Value Ref Range   WBC 9.5 4.0 - 10.5 K/uL   RBC 2.40 (L) 4.22 - 5.81 MIL/uL   Hemoglobin 8.2 (L) 13.0 - 17.0 g/dL   HCT 26.3 (L) 39.0 - 52.0 %   MCV 109.6 (H) 78.0 - 100.0 fL   MCH 34.2 (H) 26.0 - 34.0 pg   MCHC 31.2 30.0 - 36.0 g/dL   RDW 16.3 (H) 11.5 - 15.5 %   Platelets 72 (L) 150 - 400 K/uL  Basic metabolic panel     Status: Abnormal   Collection Time: 12/28/14  4:05 AM  Result Value Ref Range   Sodium 141 135 - 145 mmol/L   Potassium 4.5 3.5 - 5.1 mmol/L   Chloride 102 101 - 111 mmol/L   CO2 33 (H) 22 - 32 mmol/L   Glucose, Bld 119 (H) 65 - 99 mg/dL   BUN 14 6 - 20 mg/dL   Creatinine, Ser 0.91 0.61 - 1.24 mg/dL   Calcium 9.3 8.9 - 10.3 mg/dL   GFR calc non Af Amer >60 >60 mL/min   GFR calc Af Amer >60 >60 mL/min   Anion gap 6 5 - 15  Glucose, capillary     Status: Abnormal   Collection Time: 12/28/14  8:07 AM  Result Value Ref Range   Glucose-Capillary 105 (H) 65 - 99 mg/dL    EKG: NSR  IMAGING: Dg Chest 2 View  12/26/2014  CLINICAL DATA:  Shortness of breath. EXAM: CHEST  2 VIEW COMPARISON:  June 21, 2013. FINDINGS: The heart size and mediastinal contours are within normal limits. Both lungs are clear. No pneumothorax or pleural effusion is noted. Right internal jugular Port-A-Cath is noted with distal tip at expected position of cavoatrial junction. Postsurgical changes are seen involving left rib. IMPRESSION: No active cardiopulmonary disease. Electronically Signed   By: Marijo Conception, M.D.   On: 12/26/2014 21:52    IMPRESSION: Principal Problem:   COPD exacerbation ([place]) Active Problems:   Acute bronchitis   Acute on chronic respiratory failure with hypoxia (HCC)   ALL (acute lymphoid leukemia) in remission (HCC)   COPD (chronic obstructive pulmonary disease) (HCC)   Chronic pain syndrome   Right knee injury   Cellulitis of leg, right   RECOMMENDATION: No obvious CHF on exam. Check BNP.  Suspect his symptoms are primarily pulmonary.   Time Spent Directly with Patient: 40 minutes  Erlene Quan [phone] beeper 12/28/2014, 4:58 PM  Patient seen and examined and history reviewed. Agree with above findings and plan. Patient presented with increased SOB and cellulitis of right leg. Echo done and report indicated EF of 30-35%. I carefully reviewed his Echo findings with Dr. Sallyanne Kuster. Images are limited due to his chronic lung disease. Definity contrast used. We feel the EF is low normal and at worse only mildly reduced with EF 50%. The patient has no clinical evidence of CHF. Ecg is normal. Diastolic parameters are normal by Echo. Will check a BNP. If normal then I would recommend no further cardiac work up or treatment. He does have atypical chest pain that is clearly noncardiac. I think his breathing problems are related to severe pulmonary disease.  Peter Martinique, [place] 12/28/2014 5:14 PM

## 2014-12-28 NOTE — Progress Notes (Addendum)
PROGRESS NOTE  Derek Shepherd MGQ:[id number] DOB: 07/05/1955 DOA: 12/26/2014 PCP: Derek Fraise, MD  HPI: 59 y.o. male with a past medical history of acute lymphocytic leukemia in remission, COPD, DVT, anemia, anxiety/depression, posterior arthritis, diverticulosis who comes to the emergency department due to progressively worse shortness of breath for the last 2 days. He states that Earlier in the week, he developed left earache, and whitish sputum productive cough associated with pleuritic chest pain and some myalgias. He subsequently developed shortness of breath in the past 48 hours which does not respond to bronchodilators given at home. The patient also reports a temperature at home of 102F.   Subjective / 24 H Interval events - no complaints, states that he is afraid that he will desat again at home - denies chest pain  Assessment/Plan: Principal Problem:   COPD exacerbation (HCC) Active Problems:   ALL (acute lymphoid leukemia) in remission (HCC)   CHF (congestive heart failure) (HCC)   Right knee injury   Cellulitis of leg, right   Acute bronchitis   Acute on chronic respiratory failure with hypoxia (HCC)   Acute on chronic respiratory failure (HCC)   Acute combined systolic and diastolic congestive heart failure (HCC)   Acute combined systolic and diastolic CHF - patient recently had a 2D echo on 09/15 [facility] which showed normal left ventricular systolic function with LV ejection fraction = 60-65%. - repeat echo today shows EF 30-35% which appears new - after echo results went back and discussed with patient, he apparently called EMS 2 weeks ago for chest pain and his EKG was normal - patient feels that he has been feeling worse since then.  - I have consulted cardiology, appreciate input  COPD exacerbation ([place])  - continue supplemental oxygen and bronchodilators - narrow steroids, quick taper as #1 is more likely to be causing patient's symptoms, narrow antibiotics  today as well - no blood cultures obtained on admission - sputum cultures pending  ALL (acute lymphoid leukemia) in remission ([place]) - monitor CBC and follow up with oncologist as scheduled.  Thrombocytopenia - chronic, stable, avoid heparin since Plt < 100 k  Right knee injury - improving  Cellulitis of leg, right - antibiotics as above, Levaquin should cover well - improving  Chronic pain syndrome - Continue current management.   Diet: Diet heart healthy/carb modified Room service appropriate?: Yes; Fluid consistency:: Thin Fluids: None DVT Prophylaxis: SCD  Code Status: Full Code Family Communication: discussed with wife / son bedside Disposition Plan: home when ready  Barriers to discharge: new onset systolic CHF, cardiology evaluation  Consultants:  None   Procedures:  2D echo  Study Conclusions - Left ventricle: The cavity size was normal. Wall thickness wasincreased in a pattern of mild LVH. Systolic function was oderately to severely reduced. The estimated ejection fractionwas in the range of 30% to 35%. Wall motion was normal; therewere no regional wall motion abnormalities. Features areconsistent with a pseudonormal left ventricular filling pattern,with concomitant abnormal relaxation and increased fillingpressure (grade 2 diastolic dysfunction).   Antibiotics Vancomycin 10/21 >> Derek Shepherd 10/21 >>   Studies  Dg Chest 2 View  12/26/2014  CLINICAL DATA:  Shortness of breath. EXAM: CHEST  2 VIEW COMPARISON:  June 21, 2013. FINDINGS: The heart size and mediastinal contours are within normal limits. Both lungs are clear. No pneumothorax or pleural effusion is noted. Right internal jugular Port-A-Cath is noted with distal tip at expected position of cavoatrial junction. Postsurgical changes are seen involving left rib.  IMPRESSION: No active cardiopulmonary disease. Electronically Signed   By: Derek Shepherd, M.D.   On: 12/26/2014 21:52   Objective  Filed  Vitals:   12/27/14 1313 12/27/14 2112 12/27/14 2229 12/28/14 0527  BP: 123/63  115/60 117/57  Pulse: 63  62 51  Temp: 98.1 F (36.7 C)  98.3 F (36.8 C) 97.9 F (36.6 C)  TempSrc: Oral  Oral Oral  Resp: 20  20 18   Height:      Weight:      SpO2: 96% 97% 100% 100%    Intake/Output Summary (Last 24 hours) at 12/28/14 1045 Last data filed at 12/28/14 0600  Gross per 24 hour  Intake   1462 ml  Output   1700 ml  Net   -238 ml   Filed Weights   12/26/14 2037  Weight: 101.152 kg (223 lb)    Exam:  GENERAL: NAD  HEENT: head NCAT, no scleral icterus.   NECK: Supple. No LAD  LUNGS: Clear to auscultation.   HEART: Regular rate and rhythm without murmur. 2+ pulses, no JVD, 1+ peripheral edema  ABDOMEN: Soft, non-distended, non-tender. Positive bowel sounds.  EXTREMITIES: Without any cyanosis or clubbing. Good muscle tone. 3-4 cm round cellulitis area with a scabbed wound on right medial aspect below the knee, no drainage, no fluctuation  NEUROLOGIC: non focal   PSYCHIATRIC: depressed   Data Reviewed: Basic Metabolic Panel:  Recent Labs Lab 12/26/14 0059 12/26/14 2217 12/27/14 0130 12/27/14 0404 12/28/14 0405  NA  --  135  --  136 141  K  --  3.8  --  4.1 4.5  CL  --  94*  --  97* 102  CO2  --  33*  --  31 33*  GLUCOSE  --  144*  --  232* 119*  BUN  --  11  --  12 14  CREATININE  --  0.87  --  0.84 0.91  CALCIUM  --  8.9  --  8.8* 9.3  MG 2.0  --  2.0  --   --   PHOS  --   --  2.5  --   --    Liver Function Tests:  Recent Labs Lab 12/26/14 0059 12/27/14 0404  AST 35 30  ALT 28 26  ALKPHOS 73 68  BILITOT 0.4 0.4  PROT 6.3* 5.7*  ALBUMIN 3.6 3.1*   CBC:  Recent Labs Lab 12/26/14 2217 12/27/14 0404 12/28/14 0405  WBC 10.6* 8.3 9.5  NEUTROABS 9.5*  --   --   HGB 8.9* 8.6* 8.2*  HCT 28.2* 26.9* 26.3*  MCV 108.9* 108.0* 109.6*  PLT 81* 71* 72*   CBG:  Recent Labs Lab 12/27/14 0756 12/27/14 1149 12/27/14 1717 12/27/14 2226  12/28/14 0807  GLUCAP 198* 186* 143* 135* 105*    Scheduled Meds: . ALPRAZolam  0.25 mg Oral BID  . antiseptic oral rinse  7 mL Mouth Rinse q12n4p  . budesonide-formoterol  2 puff Inhalation BID  . cefTAZidime (FORTAZ)  IV  1 g Intravenous Q12H  . chlorhexidine  15 mL Mouth Rinse BID  . fentaNYL  25 mcg Transdermal Q48H  . folic acid  1 mg Oral Daily  . guaiFENesin  600 mg Oral BID  . insulin aspart  0-9 Units Subcutaneous TID WC  . pantoprazole  40 mg Oral Daily  . potassium chloride  20 mEq Oral Daily  . predniSONE  30 mg Oral Q breakfast  . tiotropium  18 mcg Inhalation  Daily  . vancomycin  1,000 mg Intravenous Q12H  . vitamin B-12  1,000 mcg Oral Daily   Continuous Infusions:    Marzetta Board, MD [facility] Pager [phone]. If 7 PM - 7 AM, please contact night-coverage at [email or web address], password [facility] 12/28/2014, 10:45 AM

## 2014-12-29 ENCOUNTER — Encounter (HOSPITAL_COMMUNITY): Payer: Self-pay | Admitting: Radiology

## 2014-12-29 ENCOUNTER — Observation Stay (HOSPITAL_COMMUNITY): Payer: Medicare Other

## 2014-12-29 DIAGNOSIS — J9621 Acute and chronic respiratory failure with hypoxia: Secondary | ICD-10-CM | POA: Diagnosis not present

## 2014-12-29 DIAGNOSIS — I519 Heart disease, unspecified: Secondary | ICD-10-CM | POA: Diagnosis not present

## 2014-12-29 DIAGNOSIS — L03115 Cellulitis of right lower limb: Secondary | ICD-10-CM | POA: Diagnosis not present

## 2014-12-29 DIAGNOSIS — J209 Acute bronchitis, unspecified: Secondary | ICD-10-CM | POA: Diagnosis not present

## 2014-12-29 DIAGNOSIS — J432 Centrilobular emphysema: Secondary | ICD-10-CM

## 2014-12-29 DIAGNOSIS — I5023 Acute on chronic systolic (congestive) heart failure: Secondary | ICD-10-CM | POA: Diagnosis not present

## 2014-12-29 DIAGNOSIS — J441 Chronic obstructive pulmonary disease with (acute) exacerbation: Secondary | ICD-10-CM | POA: Diagnosis not present

## 2014-12-29 LAB — GLUCOSE, CAPILLARY
GLUCOSE-CAPILLARY: 88 mg/dL (ref 65–99)
GLUCOSE-CAPILLARY: 151 mg/dL — AB (ref 65–99)
GLUCOSE-CAPILLARY: 157 mg/dL — AB (ref 65–99)
GLUCOSE-CAPILLARY: 117 mg/dL — AB (ref 65–99)

## 2014-12-29 LAB — HEMOGLOBIN A1C
Hgb A1c MFr Bld: 5.6 % (ref 4.8–5.6)
Mean Plasma Glucose: 114 mg/dL

## 2014-12-29 LAB — BRAIN NATRIURETIC PEPTIDE: B Natriuretic Peptide: 352.5 pg/mL — ABNORMAL HIGH (ref 0.0–100.0)

## 2014-12-29 MED ORDER — FUROSEMIDE 40 MG PO TABS
40.0000 mg | ORAL_TABLET | Freq: Every day | ORAL | Status: DC
  Administered 2014-12-29: 40 mg via ORAL
  Filled 2014-12-29: qty 1

## 2014-12-29 MED ORDER — IOHEXOL 350 MG/ML SOLN
100.0000 mL | Freq: Once | INTRAVENOUS | Status: AC | PRN
  Administered 2014-12-29: 100 mL via INTRAVENOUS

## 2014-12-29 MED ORDER — ZOLPIDEM TARTRATE 5 MG PO TABS
5.0000 mg | ORAL_TABLET | Freq: Once | ORAL | Status: AC
  Administered 2014-12-29: 5 mg via ORAL
  Filled 2014-12-29: qty 1

## 2014-12-29 NOTE — Care Management Note (Signed)
Case Management Note  Patient Details  Name: ACESON LABELL MRN: [id number] Date of Birth: [birth year]  Subjective/Objective:                 Pt from home with wife. Has oxygen through [place]. Patieny and wife prefer [facility] for [facility] needs. Referral made for [facility]    Action/Plan:   Expected Discharge Date:                  Expected Discharge Plan:  [place]  In-House Referral:     Discharge planning Services  CM Consult  Post Acute Care Choice:  Home Health Choice offered to:  Patient, Spouse  DME Arranged:    DME Agency:     HH Arranged:  PT Alpha:  [facility]  Status of Service:  Completed, signed off  Medicare Important Message Given:    Date Medicare IM Given:    Medicare IM give by:    Date Additional Medicare IM Given:    Additional Medicare Important Message give by:     If discussed at [place] of Stay Meetings, dates discussed:    Additional Comments:  Carles Collet, RN 12/29/2014, 11:10 AM

## 2014-12-29 NOTE — Progress Notes (Signed)
Patient Name: Derek Shepherd Date of Encounter: 12/29/2014  Primary Cardiologist: Mina Marble   Principal Problem:   COPD exacerbation ([place]) Active Problems:   ALL (acute lymphoid leukemia) in remission ([place])   COPD (chronic obstructive pulmonary disease) (HCC)   Chronic pain syndrome   Right knee injury   Cellulitis of leg, right   Acute bronchitis   Acute on chronic respiratory failure with hypoxia ([place])    SUBJECTIVE  Continue to have SOB, per wife, previous ALL treatment and bone marrow transplant "messed up the lung". Intermittent CP with inspiration, worsening in the last few days  CURRENT MEDS . albuterol  2.5 mg Nebulization TID  . ALPRAZolam  0.25 mg Oral BID  . antiseptic oral rinse  7 mL Mouth Rinse q12n4p  . budesonide-formoterol  2 puff Inhalation BID  . chlorhexidine  15 mL Mouth Rinse BID  . fentaNYL  25 mcg Transdermal Q48H  . fluticasone  1 spray Each Nare Daily  . folic acid  1 mg Oral Daily  . guaiFENesin  600 mg Oral BID  . insulin aspart  0-9 Units Subcutaneous TID WC  . levofloxacin  750 mg Oral Daily  . pantoprazole  40 mg Oral Daily  . potassium chloride  20 mEq Oral Daily  . predniSONE  30 mg Oral Q breakfast  . tiotropium  18 mcg Inhalation Daily  . vitamin B-12  1,000 mcg Oral Daily    OBJECTIVE  Filed Vitals:   12/28/14 2004 12/28/14 2144 12/29/14 0629 12/29/14 0907  BP:  117/59 133/66   Pulse:  64 60   Temp:  98.2 F (36.8 C) 98.3 F (36.8 C)   TempSrc:  Oral Oral   Resp:  20 18   Height:      Weight:      SpO2: 97% 100% 100% 98%    Intake/Output Summary (Last 24 hours) at 12/29/14 1027 Last data filed at 12/29/14 0917  Gross per 24 hour  Intake    746 ml  Output   1350 ml  Net   -604 ml   Filed Weights   12/26/14 2037  Weight: 223 lb (101.152 kg)    PHYSICAL EXAM  General: Pleasant, NAD. Neuro: Alert and oriented X 3. Moves all extremities spontaneously. Psych: Normal affect. HEENT:  Normal  Neck: Supple without  bruits or JVD. Lungs:  Resp regular and unlabored. Did not appreciate much wheezing, does have mild bibasilar rale.  Heart: RRR no s3, s4, or murmurs. Abdomen: Soft, non-tender, non-distended, BS + x 4.  Extremities: No clubbing, cyanosis or edema. DP/PT/Radials 2+ and equal bilaterally.  Accessory Clinical Findings  CBC  Recent Labs  12/26/14 2217 12/27/14 0404 12/28/14 0405  WBC 10.6* 8.3 9.5  NEUTROABS 9.5*  --   --   HGB 8.9* 8.6* 8.2*  HCT 28.2* 26.9* 26.3*  MCV 108.9* 108.0* 109.6*  PLT 81* 71* 72*   Basic Metabolic Panel  Recent Labs  12/27/14 0130 12/27/14 0404 12/28/14 0405  NA  --  136 141  K  --  4.1 4.5  CL  --  97* 102  CO2  --  31 33*  GLUCOSE  --  232* 119*  BUN  --  12 14  CREATININE  --  0.84 0.91  CALCIUM  --  8.8* 9.3  MG 2.0  --   --   PHOS 2.5  --   --    Liver Function Tests  Recent Labs  12/27/14 0404  AST  30  ALT 26  ALKPHOS 68  BILITOT 0.4  PROT 5.7*  ALBUMIN 3.1*   Cardiac Enzymes  Recent Labs  12/28/14 1609  TROPONINI <0.03   Hemoglobin A1C  Recent Labs  12/27/14 0404  HGBA1C 5.6    TELE NSR with HR 60-70s    ECG  No new EKG  Echocardiogram  LV EF: 30% -  35%  ------------------------------------------------------------------- Indications:   Dyspnea 786.09.  ------------------------------------------------------------------- History:  PMH: ARF. Congestive heart failure. Chronic obstructive pulmonary disease. Risk factors: Diabetes mellitus.  ------------------------------------------------------------------- Study Conclusions  - Left ventricle: The cavity size was normal. Wall thickness was increased in a pattern of mild LVH. Systolic function was moderately to severely reduced. The estimated ejection fraction was in the range of 30% to 35%. Wall motion was normal; there were no regional wall motion abnormalities. Features are consistent with a pseudonormal left ventricular  filling pattern, with concomitant abnormal relaxation and increased filling pressure (grade 2 diastolic dysfunction).   Note Echo has been over read by Dr. Martinique and Dr. Sallyanne Kuster, EF felt to be 50%    Radiology/Studies  Dg Chest 2 View  12/26/2014  CLINICAL DATA:  Shortness of breath. EXAM: CHEST  2 VIEW COMPARISON:  June 21, 2013. FINDINGS: The heart size and mediastinal contours are within normal limits. Both lungs are clear. No pneumothorax or pleural effusion is noted. Right internal jugular Port-A-Cath is noted with distal tip at expected position of cavoatrial junction. Postsurgical changes are seen involving left rib. IMPRESSION: No active cardiopulmonary disease. Electronically Signed   By: Marijo Conception, M.D.   On: 12/26/2014 21:52   Dg Knee 1-2 Views Right  12/18/2014  CLINICAL DATA:  Injury.  Initial evaluation. EXAM: RIGHT KNEE - 1-2 VIEW COMPARISON:  None. FINDINGS: No acute bony or joint abnormality identified. Diffuse degenerative change. No evidence of fracture. IMPRESSION: No acute abnormality. Electronically Signed   By: Marcello Moores  Register   On: 12/18/2014 15:47    ASSESSMENT AND PLAN  59 yo male with PMH of ALL, severe COPD and "heart failure" presented with several days of fatigue, DOE, pleuritic CP and fever. Echo shows EF down to 35%, echo image reviewed by Dr. Martinique and Dr. Sallyanne Kuster, feels EF more like 50%.   1. LV dysfunction:  - echo read as EF 35%, but felt to be closer to 50% on review by Dr. Sallyanne Kuster and Dr. Martinique  - per Dr. Martinique, no further workup if BNP is normal, BNP mildly elevated at 352 in the setting of COPD exacerbation and severe anemia  - does have bibasilar rale, not clear if atelectasis vs acute on chronic diastolic HF. However degree of SOB definitely much more significant than the little amount of rale appreciated on exam, likely responsible by COPD exacerbation. Per pt, he is on 80mg  BID PO lasix at home, this was not documented as home  med here nor was it given, but is documented as a home med at [facility] he has not had trouble urinating, filling an entire container of urine overnight (nurse tech emptied in front of me, not sure if documented). May consider restarting home dose of PO lasix to see if symptom improve.   2. COPD exacerbation on 24/7 home O2  3. 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 ALL in remission  5. Chronic pain syndrome  - still has intermittent CP at rest, also occur during inspiration.   - not unreasonable to r/o PE given degree of SOB,  however less suspicion given HR 60-70s despite SOB and not on any rate control medication.   Hilbert Corrigan PA-C Pager: [phone]  The patient was seen, examined and discussed with Almyra Deforest, PA-C and I agree with the above.   59 year old male with h/o ALL, severe COPD admitted with acute COPD exacerbation, mildly impaired LVEF (50%) and mild fluid overload on physical exam. BNP minimally elevated, symptoms out of proportion with LVEF and mild BNP elevation and most probably could be attributed to COPD. We will start lasix 40 mg po daily and monitor crea.  Dorothy Spark 12/29/2014

## 2014-12-29 NOTE — Progress Notes (Signed)
PROGRESS NOTE  WENCESLAUS GIST CHY:[id number] DOB: May 29, 1955 DOA: 12/26/2014 PCP: Claretta Fraise, MD  HPI: 59 y.o. male with a past medical history of acute lymphocytic leukemia in remission, COPD, DVT, anemia, anxiety/depression, posterior arthritis, diverticulosis who comes to the emergency department due to progressively worse shortness of breath for the last 2 days. He states that Earlier in the week, he developed left earache, and whitish sputum productive cough associated with pleuritic chest pain and some myalgias. He subsequently developed shortness of breath in the past 48 hours which does not respond to bronchodilators given at home. The patient also reports a temperature at home of 102F.   Subjective / 24 H Interval events - continues to feel poorly, endorses dyspnea with just few steps  Assessment/Plan: Principal Problem:   COPD exacerbation (HCC) Active Problems:   ALL (acute lymphoid leukemia) in remission (HCC)   COPD (chronic obstructive pulmonary disease) (HCC)   Chronic pain syndrome   Right knee injury   Cellulitis of leg, right   Acute bronchitis   Acute on chronic respiratory failure with hypoxia (HCC)   Acute on chronic hypoxic respiratory failure - patient recently had a 2D echo on 09/15 at [facility] which showed normal left ventricular systolic function with LV ejection fraction = 60-65%. - repeat echo 10/23 showed EF 30-35% which appears new, cardiology consulted and on further review of the echo it appears that EF is ~50%, appearing that it is unlikely that he has true systolic heart failure - patient with history of DVT/PE, high risk will obtain CT angio  COPD exacerbation ([place])  - continue supplemental oxygen and bronchodilators - continue Prednisone / Lovenox - no blood cultures obtained on admission - sputum cultures pending  ALL (acute lymphoid leukemia) in remission ([place]) - monitor CBC and follow up with oncologist as scheduled.  Thrombocytopenia -  chronic, stable, avoid heparin since Plt < 100 k  Right knee injury - improving  Cellulitis of leg, right - antibiotics as above, Levaquin should cover well - improving  Chronic pain syndrome - Continue current management.   Diet: Diet heart healthy/carb modified Room service appropriate?: Yes; Fluid consistency:: Thin Fluids: None DVT Prophylaxis: SCD  Code Status: Full Code Family Communication: discussed with wife bedside Disposition Plan: home when ready  Barriers to discharge: CT Angio, diuresing  Consultants:  None   Procedures:  2D echo  Study Conclusions - Left ventricle: The cavity size was normal. Wall thickness wasincreased in a pattern of mild LVH. Systolic function was oderately to severely reduced. The estimated ejection fractionwas in the range of 30% to 35%. Wall motion was normal; therewere no regional wall motion abnormalities. Features areconsistent with a pseudonormal left ventricular filling pattern,with concomitant abnormal relaxation and increased fillingpressure (grade 2 diastolic dysfunction).   Antibiotics Vancomycin 10/21 >> 10/23 Fortaz 10/21 >> 10/23 Levofloxacin 10/23 >>   Studies  No results found. Objective  Filed Vitals:   12/28/14 2004 12/28/14 2144 12/29/14 0629 12/29/14 0907  BP:  117/59 133/66   Pulse:  64 60   Temp:  98.2 F (36.8 C) 98.3 F (36.8 C)   TempSrc:  Oral Oral   Resp:  20 18   Height:      Weight:      SpO2: 97% 100% 100% 98%    Intake/Output Summary (Last 24 hours) at 12/29/14 1132 Last data filed at 12/29/14 1029  Gross per 24 hour  Intake    746 ml  Output   2250 ml  Net  -  1504 ml   Filed Weights   12/26/14 2037  Weight: 101.152 kg (223 lb)    Exam:  GENERAL: NAD  HEENT: head NCAT, no scleral icterus.   NECK: Supple. No LAD  LUNGS: Clear to auscultation.   HEART: Regular rate and rhythm without murmur. 2+ pulses, no JVD, 1+ peripheral edema  ABDOMEN: Soft, non-distended,  non-tender. Positive bowel sounds.  EXTREMITIES: Without any cyanosis or clubbing. Good muscle tone. 3-4 cm round cellulitis area with a scabbed wound on right medial aspect below the knee, no drainage, no fluctuation  NEUROLOGIC: non focal   PSYCHIATRIC: depressed   Data Reviewed: Basic Metabolic Panel:  Recent Labs Lab 12/26/14 0059 12/26/14 2217 12/27/14 0130 12/27/14 0404 12/28/14 0405  NA  --  135  --  136 141  K  --  3.8  --  4.1 4.5  CL  --  94*  --  97* 102  CO2  --  33*  --  31 33*  GLUCOSE  --  144*  --  232* 119*  BUN  --  11  --  12 14  CREATININE  --  0.87  --  0.84 0.91  CALCIUM  --  8.9  --  8.8* 9.3  MG 2.0  --  2.0  --   --   PHOS  --   --  2.5  --   --    Liver Function Tests:  Recent Labs Lab 12/26/14 0059 12/27/14 0404  AST 35 30  ALT 28 26  ALKPHOS 73 68  BILITOT 0.4 0.4  PROT 6.3* 5.7*  ALBUMIN 3.6 3.1*   CBC:  Recent Labs Lab 12/26/14 2217 12/27/14 0404 12/28/14 0405  WBC 10.6* 8.3 9.5  NEUTROABS 9.5*  --   --   HGB 8.9* 8.6* 8.2*  HCT 28.2* 26.9* 26.3*  MCV 108.9* 108.0* 109.6*  PLT 81* 71* 72*   CBG:  Recent Labs Lab 12/27/14 1717 12/27/14 2226 12/28/14 0807 12/28/14 2142 12/29/14 0749  GLUCAP 143* 135* 105* 126* 88    Scheduled Meds: . albuterol  2.5 mg Nebulization TID  . ALPRAZolam  0.25 mg Oral BID  . antiseptic oral rinse  7 mL Mouth Rinse q12n4p  . budesonide-formoterol  2 puff Inhalation BID  . chlorhexidine  15 mL Mouth Rinse BID  . fentaNYL  25 mcg Transdermal Q48H  . fluticasone  1 spray Each Nare Daily  . folic acid  1 mg Oral Daily  . furosemide  40 mg Oral Daily  . guaiFENesin  600 mg Oral BID  . insulin aspart  0-9 Units Subcutaneous TID WC  . levofloxacin  750 mg Oral Daily  . pantoprazole  40 mg Oral Daily  . potassium chloride  20 mEq Oral Daily  . predniSONE  30 mg Oral Q breakfast  . tiotropium  18 mcg Inhalation Daily  . vitamin B-12  1,000 mcg Oral Daily   Continuous Infusions:     Marzetta Board, MD [facility] Pager [phone]. If 7 PM - 7 AM, please contact night-coverage at [email or web address], password [facility] 12/29/2014, 11:32 AM

## 2014-12-30 DIAGNOSIS — C9101 Acute lymphoblastic leukemia, in remission: Secondary | ICD-10-CM | POA: Diagnosis not present

## 2014-12-30 DIAGNOSIS — I5023 Acute on chronic systolic (congestive) heart failure: Secondary | ICD-10-CM

## 2014-12-30 DIAGNOSIS — L03115 Cellulitis of right lower limb: Secondary | ICD-10-CM | POA: Diagnosis not present

## 2014-12-30 DIAGNOSIS — J9621 Acute and chronic respiratory failure with hypoxia: Secondary | ICD-10-CM | POA: Diagnosis not present

## 2014-12-30 DIAGNOSIS — J441 Chronic obstructive pulmonary disease with (acute) exacerbation: Secondary | ICD-10-CM | POA: Diagnosis not present

## 2014-12-30 LAB — BASIC METABOLIC PANEL
ANION GAP: 8 (ref 5–15)
BUN: 13 mg/dL (ref 6–20)
CALCIUM: 9.6 mg/dL (ref 8.9–10.3)
CO2: 33 mmol/L — AB (ref 22–32)
Chloride: 100 mmol/L — ABNORMAL LOW (ref 101–111)
Creatinine, Ser: 0.69 mg/dL (ref 0.61–1.24)
Glucose, Bld: 99 mg/dL (ref 65–99)
Potassium: 3.9 mmol/L (ref 3.5–5.1)
Sodium: 141 mmol/L (ref 135–145)

## 2014-12-30 LAB — CBC
HCT: 27.2 % — ABNORMAL LOW (ref 39.0–52.0)
HEMOGLOBIN: 8.5 g/dL — AB (ref 13.0–17.0)
MCH: 33.6 pg (ref 26.0–34.0)
MCHC: 31.3 g/dL (ref 30.0–36.0)
MCV: 107.5 fL — ABNORMAL HIGH (ref 78.0–100.0)
Platelets: 78 10*3/uL — ABNORMAL LOW (ref 150–400)
RBC: 2.53 MIL/uL — AB (ref 4.22–5.81)
RDW: 15.7 % — ABNORMAL HIGH (ref 11.5–15.5)
WBC: 6.4 10*3/uL (ref 4.0–10.5)

## 2014-12-30 LAB — GLUCOSE, CAPILLARY
GLUCOSE-CAPILLARY: 92 mg/dL (ref 65–99)
GLUCOSE-CAPILLARY: 118 mg/dL — AB (ref 65–99)

## 2014-12-30 MED ORDER — PREDNISONE 10 MG PO TABS: 20.0000 mg | ORAL_TABLET | Freq: Every day | ORAL | Status: DC

## 2014-12-30 MED ORDER — FUROSEMIDE 80 MG PO TABS: 80.0000 mg | ORAL_TABLET | Freq: Two times a day (BID) | ORAL | Status: DC

## 2014-12-30 MED ORDER — ALBUTEROL SULFATE (2.5 MG/3ML) 0.083% IN NEBU: 2.5000 mg | INHALATION_SOLUTION | RESPIRATORY_TRACT | Status: DC | PRN

## 2014-12-30 MED ORDER — FUROSEMIDE 80 MG PO TABS
80.0000 mg | ORAL_TABLET | Freq: Two times a day (BID) | ORAL | Status: DC
  Administered 2014-12-30: 80 mg via ORAL
  Filled 2014-12-30: qty 2

## 2014-12-30 MED ORDER — LEVOFLOXACIN 750 MG PO TABS: 750.0000 mg | ORAL_TABLET | Freq: Every day | ORAL | Status: DC

## 2014-12-30 NOTE — Discharge Summary (Signed)
Physician Discharge Summary  Derek Shepherd:[id number] DOB: 16-Jul-1955 DOA: 12/26/2014  PCP: Derek Fraise, MD  Admit date: 12/26/2014 Discharge date: 12/30/2014  Time spent: > 30 minutes  Recommendations for Outpatient Follow-up:  1. Follow up with Dr. Claretta Shepherd as scheduled next week  2. Follow up with Pulmonology/Cardiology/HemeOnc at [facility] as scheduled  Discharge Diagnoses:  Principal Problem:   COPD exacerbation ([place]) Active Problems:   ALL (acute lymphoid leukemia) in remission ([place])   COPD (chronic obstructive pulmonary disease) (HCC)   Chronic pain syndrome   Right knee injury   Cellulitis of leg, right   Acute bronchitis   Acute on chronic respiratory failure with hypoxia ([place])  Discharge Condition: stable  Diet recommendation: heart healthy  Filed Weights   12/26/14 2037  Weight: 101.152 kg (223 lb)    History of present illness:  Derek Shepherd is a 59 y.o. male with a past medical history of acute lymphocytic leukemia in remission, COPD, DVT, anemia, anxiety/depression, posterior arthritis, diverticulosis who comes to the emergency department due to progressively worse shortness of breath for the last 2 days. He states that Earlier in the week, he developed left earache, and whitish sputum productive cough associated with pleuritic chest pain and some myalgias. He subsequently developed shortness of breath in the past 48 hours which does not respond to bronchodilators given at home. The patient also reports a temperature at home of 102F.  The patient also has been treated for right lower extremity cellulitis with oral antibiotics for several days, he has been exposed to a hospital setting and has taken Bactrim in and fluconazole for prophylaxis in the past. When seen in the emergency department, he was in no acute distress and stated that his symptoms were better.  Hospital Course:   Acute on chronic hypoxic respiratory failure - patient recently had  a 2D echo on 09/15 at [facility] which showed normal left ventricular systolic function with LV ejection fraction = 60-65%. Repeat echo 10/23 showed EF 30-35% which appears new, cardiology consulted and on further review by 2 additional cardiologists it appears that the EF is more ~50% than initial read, appearing that it is unlikely that he has true systolic heart failure. He had mild fluid overload on exam and was started on diuretics. Given patient with history of DVT/PE and high risk, he underwent a CT Angio which was negative for PE. His respiratory status slowly improved with diuretics and PT, he was feeling improved and was discharged home in stable condition, with HHPT and instructed to follow up with his Pulmonologist at [facility] as soon as possible.  COPD exacerbation ([place]) - with wheezing and cough on presentations, continue supplemental oxygen and bronchodilators, continue Levofloxacin for 3 additional days and a short prednisone taper.  ALL (acute lymphoid leukemia) in remission ([place]) - monitor CBC and follow up with oncologist as scheduled. Thrombocytopenia - chronic, stable, avoid heparin since Plt < 100 k Right knee injury - improving Cellulitis of leg, right - antibiotics as above, Levaquin should cover well Chronic pain syndrome - Continue current management.  Procedures:  2D Echo   Consultations:  Cardiology   Discharge Exam: Filed Vitals:   12/29/14 2136 12/30/14 0535 12/30/14 0752 12/30/14 1417  BP: 123/65 133/70  126/71  Pulse: 67 57  90  Temp: 98.6 F (37 C) 97.5 F (36.4 C)  97.9 F (36.6 C)  TempSrc: Oral Oral  Oral  Resp: 18 18  20   Height:      Weight:  SpO2: 100% 100% 99% 100%   General: NAD Cardiovascular: RRR Respiratory: CTA biL  Discharge Instructions Activity:  As tolerated   Get Medicines reviewed and adjusted: Please take all your medications with you for your next visit with your Primary MD  Please request your Primary MD to go over all hospital  tests and procedure/radiological results at the follow up, please ask your Primary MD to get all Hospital records sent to his/her office.  If you experience worsening of your admission symptoms, develop shortness of breath, life threatening emergency, suicidal or homicidal thoughts you must seek medical attention immediately by calling 911 or calling your MD immediately if symptoms less severe.  You must read complete instructions/literature along with all the possible adverse reactions/side effects for all the Medicines you take and that have been prescribed to you. Take any new Medicines after you have completely understood and accpet all the possible adverse reactions/side effects.   Do not drive when taking Pain medications.   Do not take more than prescribed Pain, Sleep and Anxiety Medications  Special Instructions: If you have smoked or chewed Tobacco in the last 2 yrs please stop smoking, stop any regular Alcohol and or any Recreational drug use.  Wear Seat belts while driving.  Please note  You were cared for by a hospitalist during your hospital stay. Once you are discharged, your primary care physician will handle any further medical issues. Please note that NO REFILLS for any discharge medications will be authorized once you are discharged, as it is imperative that you return to your primary care physician (or establish a relationship with a primary care physician if you do not have one) for your aftercare needs so that they can reassess your need for medications and monitor your lab values.    Medication List    STOP taking these medications        cephALEXin 500 MG capsule  Commonly known as:  KEFLEX     doxycycline 100 MG capsule  Commonly known as:  VIBRAMYCIN      TAKE these medications        albuterol (2.5 MG/3ML) 0.083% nebulizer solution  Commonly known as:  PROVENTIL  Take 3 mLs (2.5 mg total) by nebulization every 6 (six) hours as needed for wheezing or  shortness of breath.     ALPRAZolam 0.25 MG tablet  Commonly known as:  XANAX  Take 0.25 mg by mouth 2 (two) times daily.     budesonide-formoterol 160-4.5 MCG/ACT inhaler  Commonly known as:  SYMBICORT  Inhale 2 puffs into the lungs every morning.     eucerin cream  Apply 1 application topically daily.     fentaNYL 25 MCG/HR patch  Commonly known as:  DURAGESIC - dosed mcg/hr  Place 25 mcg onto the skin every other day.     folic acid 1 MG tablet  Commonly known as:  FOLVITE  Take 1 mg by mouth daily.     furosemide 80 MG tablet  Commonly known as:  LASIX  Take 1 tablet (80 mg total) by mouth 2 (two) times daily.     lactulose 10 GM/15ML solution  Commonly known as:  CHRONULAC  Take 10 g by mouth daily as needed for mild constipation.     levofloxacin 750 MG tablet  Commonly known as:  LEVAQUIN  Take 1 tablet (750 mg total) by mouth daily.     Magnesium Chloride-Calcium 64-106 MG Tbec  Take 2 tablets by mouth daily.  MICRO-K 10 MEQ CR capsule  Generic drug:  potassium chloride  30 mEq 2 (two) times daily. Takes 2 capsules daily     Multiple Vitamins tablet  Take 1 tablet by mouth daily.     Naloxone HCl 0.4 MG/0.4ML Soaj  Inject 0.4 mg as directed daily as needed.     NUCYNTA 50 MG Tabs tablet  Generic drug:  tapentadol  Take 50 mg by mouth every 6 (six) hours as needed for moderate pain.     ONE TOUCH ULTRA TEST test strip  Generic drug:  glucose blood  USE ONE STRIP TO CHECK GLUCOSE ONCE DAILY IN THE MORNING FOR  FASTING  BLOOD  SUGAR     OXYGEN  Inhale into the lungs. Continuous 5 LPM when up and about 3 LPM constant and at night     pantoprazole 40 MG tablet  Commonly known as:  PROTONIX  TAKE ONE TABLET BY MOUTH ONCE DAILY IN THE MORNING     predniSONE 10 MG tablet  Commonly known as:  DELTASONE  Take 2 tablets (20 mg total) by mouth daily with breakfast. For 3 more days     promethazine 25 MG tablet  Commonly known as:  PHENERGAN  TAKE ONE  TABLET BY MOUTH 4 TIMES DAILY AS NEEDED FOR NAUSEA     SPIRIVA HANDIHALER 18 MCG inhalation capsule  Generic drug:  tiotropium  Place 1 capsule (18 mcg total) into inhaler and inhale daily.     SYSTANE PRESERVATIVE FREE 0.4-0.3 % Soln  Generic drug:  Polyethyl Glycol-Propyl Glycol  Place 1 drop into both eyes daily as needed. For dry eyes     vitamin B-12 1000 MCG tablet  Commonly known as:  CYANOCOBALAMIN  Take 1,000 mcg by mouth.           Follow-up Information    Follow up with STACKS,WARREN, MD. Schedule an appointment as soon as possible for a visit on 01/06/2015.   Specialty:  Family Medicine   Why:  Appointment with Dr. Quinn Axe is on 01/06/15 at 2:10pm   Contact information:   [place] [address] [phone]       The results of significant diagnostics from this hospitalization (including imaging, microbiology, ancillary and laboratory) are listed below for reference.    Significant Diagnostic Studies: Dg Chest 2 View  12/26/2014  CLINICAL DATA:  Shortness of breath. EXAM: CHEST  2 VIEW COMPARISON:  June 21, 2013. FINDINGS: The heart size and mediastinal contours are within normal limits. Both lungs are clear. No pneumothorax or pleural effusion is noted. Right internal jugular Port-A-Cath is noted with distal tip at expected position of cavoatrial junction. Postsurgical changes are seen involving left rib. IMPRESSION: No active cardiopulmonary disease. Electronically Signed   By: Marijo Conception, M.D.   On: 12/26/2014 21:52   Dg Knee 1-2 Views Right  12/18/2014  CLINICAL DATA:  Injury.  Initial evaluation. EXAM: RIGHT KNEE - 1-2 VIEW COMPARISON:  None. FINDINGS: No acute bony or joint abnormality identified. Diffuse degenerative change. No evidence of fracture. IMPRESSION: No acute abnormality. Electronically Signed   By: Marcello Moores  Register   On: 12/18/2014 15:47   Ct Angio Chest Pe W/cm &/or Wo Cm  12/29/2014  CLINICAL DATA:  Patient history of acute  lymphocytic leukemia status post remission. Anxiety and depression. Worsening shortness of breath for the last 2 days. EXAM: CT ANGIOGRAPHY CHEST WITH CONTRAST TECHNIQUE: Multidetector CT imaging of the chest was performed using the standard protocol  during bolus administration of intravenous contrast. Multiplanar CT image reconstructions and MIPs were obtained to evaluate the vascular anatomy. CONTRAST:  155mL OMNIPAQUE IOHEXOL 350 MG/ML SOLN COMPARISON:  Chest radiograph 12/26/2014; chest CT 07/16/2009 ; CT abdomen pelvis 12/26/2006 FINDINGS: Mediastinum/Nodes: Right anterior chest wall Port-A-Cath is present with tip terminating in the right atrium. Heart is normal in size. No pericardial effusion. Aorta and main pulmonary artery normal in caliber. Adequate opacification of the pulmonary arterial system. Motion artifact within upper lobes bilaterally limits evaluation. Within the above limitations no evidence for pulmonary embolism. Lungs/Pleura: Excretory phase imaging. Motion artifact limits evaluation of the pulmonary parenchyma. Scattered ground-glass opacities likely represent atelectasis. More focal tree-in-bud nodular opacities within the right lower lobe (image [id number]). No large pleural effusion pneumothorax. Upper abdomen: Unchanged 1.6 cm incompletely visualized cystic lesion within the posterior right hepatic lobe. Gallbladder is not visualized. There is a 6 mm stone within the superior pole of the left kidney. Adrenal glands are normal. Musculoskeletal: Re- demonstrated postsurgical changes involving the posterior left ribs. No aggressive or acute appearing osseous lesions. Review of the MIP images confirms the above findings. IMPRESSION: Limited due to motion artifact within the upper lobes bilaterally. Within the above limitation no evidence for pulmonary embolism. Tree-in-bud nodular opacities within the right lower lobe are nonspecific and may be secondary to aspiration or infection. Consider  follow-up chest CT in 6- 8 weeks to assess for resolution. Scattered bilateral ground-glass pulmonary opacities favored represent atelectasis and possible associated areas of air trapping given expiratory phase imaging. Left nephrolithiasis. Electronically Signed   By: Lovey Newcomer M.D.   On: 12/29/2014 14:18   Labs: Basic Metabolic Panel:  Recent Labs Lab 12/26/14 0059 12/26/14 2217 12/27/14 0130 12/27/14 0404 12/28/14 0405 12/30/14 0605  NA  --  135  --  136 141 141  K  --  3.8  --  4.1 4.5 3.9  CL  --  94*  --  97* 102 100*  CO2  --  33*  --  31 33* 33*  GLUCOSE  --  144*  --  232* 119* 99  BUN  --  11  --  12 14 13   CREATININE  --  0.87  --  0.84 0.91 0.69  CALCIUM  --  8.9  --  8.8* 9.3 9.6  MG 2.0  --  2.0  --   --   --   PHOS  --   --  2.5  --   --   --    Liver Function Tests:  Recent Labs Lab 12/26/14 0059 12/27/14 0404  AST 35 30  ALT 28 26  ALKPHOS 73 68  BILITOT 0.4 0.4  PROT 6.3* 5.7*  ALBUMIN 3.6 3.1*   CBC:  Recent Labs Lab 12/26/14 2217 12/27/14 0404 12/28/14 0405 12/30/14 0605  WBC 10.6* 8.3 9.5 6.4  NEUTROABS 9.5*  --   --   --   HGB 8.9* 8.6* 8.2* 8.5*  HCT 28.2* 26.9* 26.3* 27.2*  MCV 108.9* 108.0* 109.6* 107.5*  PLT 81* 71* 72* 78*   Cardiac Enzymes:  Recent Labs Lab 12/28/14 1609  TROPONINI <0.03   BNP: BNP (last 3 results)  Recent Labs  12/29/14 0508  BNP 352.5*   CBG:  Recent Labs Lab 12/29/14 1206 12/29/14 1653 12/29/14 2135 12/30/14 0808 12/30/14 1208  GLUCAP 151* 157* 117* 92 118*   Signed:  Missie Gehrig  [facility] 12/30/2014, 2:45 PM

## 2014-12-30 NOTE — Progress Notes (Signed)
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  CURRENT MEDS . albuterol  2.5 mg Nebulization TID  . ALPRAZolam  0.25 mg Oral BID  . antiseptic oral rinse  7 mL Mouth Rinse q12n4p  . budesonide-formoterol  2 puff Inhalation BID  . chlorhexidine  15 mL Mouth Rinse BID  . fentaNYL  25 mcg Transdermal Q48H  . fluticasone  1 spray Each Nare Daily  . folic acid  1 mg Oral Daily  . furosemide  80 mg Oral BID  . guaiFENesin  600 mg Oral BID  . insulin aspart  0-9 Units Subcutaneous TID WC  . levofloxacin  750 mg Oral Daily  . pantoprazole  40 mg Oral Daily  . potassium chloride  20 mEq Oral Daily  . predniSONE  30 mg Oral Q breakfast  . tiotropium  18 mcg Inhalation Daily  . vitamin B-12  1,000 mcg Oral Daily    OBJECTIVE  Filed Vitals:   12/29/14 2027 12/29/14 2136 12/30/14 0535 12/30/14 0752  BP:  123/65 133/70   Pulse:  67 57   Temp:  98.6 F (37 C) 97.5 F (36.4 C)   TempSrc:  Oral Oral   Resp:  18 18   Height:      Weight:      SpO2: 97% 100% 100% 99%    Intake/Output Summary (Last 24 hours) at 12/30/14 1100 Last data filed at 12/29/14 1309  Gross per 24 hour  Intake    222 ml  Output      0 ml  Net    222 ml   Filed Weights   12/26/14 2037  Weight: 223 lb (101.152 kg)    PHYSICAL EXAM  General: Pleasant, NAD. Neuro: Alert and oriented X 3. Moves all extremities spontaneously. Psych: Normal affect. HEENT:  Normal  Neck: Supple without bruits or JVD. Lungs:  Resp regular and unlabored. Did not  appreciate much wheezing, does have mild bibasilar rale.  Heart: RRR no s3, s4, or murmurs. Abdomen: Soft, non-tender, non-distended, BS + x 4.  Extremities: No clubbing, cyanosis or edema. DP/PT/Radials 2+ and equal bilaterally.  Accessory Clinical Findings  CBC  Recent Labs  12/28/14 0405 12/30/14 0605  WBC 9.5 6.4  HGB 8.2* 8.5*  HCT 26.3* 27.2*  MCV 109.6* 107.5*  PLT 72* 78*   Basic Metabolic Panel  Recent Labs  12/28/14 0405 12/30/14 0605  NA 141 141  K 4.5 3.9  CL 102 100*  CO2 33* 33*  GLUCOSE 119* 99  BUN 14 13  CREATININE 0.91 0.69  CALCIUM 9.3 9.6    Recent Labs  12/28/14 1609  TROPONINI <0.03   TELE NSR with HR 60-70s    ECG  No new EKG  Echocardiogram  LV EF: 30% -  35%  ------------------------------------------------------------------- Indications:   Dyspnea 786.09.  ------------------------------------------------------------------- History:  PMH: ARF. Congestive heart failure. Chronic obstructive pulmonary disease. Risk factors: Diabetes mellitus.  ------------------------------------------------------------------- Study Conclusions  - Left ventricle: The cavity size was normal. Wall thickness was increased  in a pattern of mild LVH. Systolic function was moderately to severely reduced. The estimated ejection fraction was in the range of 30% to 35%. Wall motion was normal; there were no regional wall motion abnormalities. Features are consistent with a pseudonormal left ventricular filling pattern, with concomitant abnormal relaxation and increased filling pressure (grade 2 diastolic dysfunction).   Note Echo has been over read by Dr. Martinique and Dr. Sallyanne Kuster, EF felt to be 50%    Radiology/Studies  Ct Angio Chest Pe W/cm &/or Wo Cm  12/29/2014  CLINICAL DATA:  Patient history of acute lymphocytic leukemia status post remission. Anxiety and depression. Worsening shortness of breath for the last 2  days.  IMPRESSION: Limited due to motion artifact within the upper lobes bilaterally. Within the above limitation no evidence for pulmonary embolism. Tree-in-bud nodular opacities within the right lower lobe are nonspecific and may be secondary to aspiration or infection. Consider follow-up chest CT in 6- 8 weeks to assess for resolution. Scattered bilateral ground-glass pulmonary opacities favored represent atelectasis and possible associated areas of air trapping given expiratory phase imaging. Left nephrolithiasis.     ASSESSMENT AND PLAN  59 yo male with PMH of ALL, severe COPD and "heart failure" presented with several days of fatigue, DOE, pleuritic CP and fever. Echo shows EF down to 35%, echo image reviewed by Dr. Martinique and Dr. Sallyanne Kuster, feels EF more like 50%.   1. LV dysfunction:  - echo read as EF 35%, but felt to be closer to 50% on review by Dr. Sallyanne Kuster and Dr. Martinique  - per Dr. Martinique, no further workup if BNP is normal, BNP mildly elevated at 352 in the setting of COPD exacerbation and severe anemia  - does have bibasilar rale, not clear if atelectasis vs acute on chronic diastolic HF. However degree of SOB definitely much more significant than the little amount of rale appreciated on exam, likely responsible by COPD exacerbation. Per pt, he is on 80mg  BID PO lasix at home, this was not documented as home med here nor was it given, but is documented as a home med at [place] he has not had trouble urinating, filling an entire container of urine overnight (nurse tech emptied in front of me, not sure if documented). May consider restarting home dose of PO lasix to see if symptom improve.   2. COPD exacerbation on 24/7 home O2  3. 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 ALL in remission  5. Chronic pain syndrome  - still has intermittent CP at rest, also occur during inspiration.   - not unreasonable to r/o PE given degree of SOB, however less suspicion  given HR 60-70s despite SOB and not on any rate control medication.   59 year old male with h/o ALL, severe COPD admitted with acute COPD exacerbation, mildly impaired LVEF (50%) and mild fluid overload on physical exam. BNP minimally elevated, symptoms out of proportion with LVEF and mild BNP elevation and most probably could be attributed to COPD. I would discharge on lasix 40 mg po BID.  Follow up with his cardiologist at [place], [place] 12/30/2014

## 2014-12-30 NOTE — Care Management Note (Signed)
Case Management Note  Patient Details  Name: Derek Shepherd MRN: [id number] Date of Birth: 05-Jul-1955  Subjective/Objective:                 Pt from home with wife. Has oxygen through [facility]. Patient and wife prefer [facility] for [facility] needs. Referral made for [facility]     Action/Plan:  Patient to DC to home with [facility] PT and RN. No other CM needs identified at this time. Expected Discharge Date:                  Expected Discharge Plan:  [place]  In-House Referral:     Discharge planning Services  CM Consult  Post Acute Care Choice:  Home Health Choice offered to:  Patient, Spouse  DME Arranged:    DME Agency:     HH Arranged:  PT, RN [place] Agency:  [place] [facility]  Status of Service:  Completed, signed off  Medicare Important Message Given:    Date Medicare IM Given:    Medicare IM give by:    Date Additional Medicare IM Given:    Additional Medicare Important Message give by:     If discussed at [place] of Stay Meetings, dates discussed:    Additional Comments:  Carles Collet, RN 12/30/2014, 1:29 PM

## 2014-12-30 NOTE — Discharge Instructions (Signed)
Follow with Claretta Fraise, MD in 5-7 days  Please get a complete blood count and chemistry panel checked by your Primary MD at your next visit, and again as instructed by your Primary MD. Please get your medications reviewed and adjusted by your Primary MD.  Please request your Primary MD to go over all Hospital Tests and Procedure/Radiological results at the follow up, please get all Hospital records sent to your Prim MD by signing hospital release before you go home.  If you had Pneumonia of Lung problems at the Hospital: Please get a 2 view Chest X ray done in 6-8 weeks after hospital discharge or sooner if instructed by your Primary MD.  If you have Congestive Heart Failure: Please call your Cardiologist or Primary MD anytime you have any of the following symptoms:  1) 3 pound weight gain in 24 hours or 5 pounds in 1 week  2) shortness of breath, with or without a dry hacking cough  3) swelling in the hands, feet or stomach  4) if you have to sleep on extra pillows at night in order to breathe  Follow cardiac low salt diet and 1.5 lit/day fluid restriction.  If you have diabetes Accuchecks 4 times/day, Once in AM empty stomach and then before each meal. Log in all results and show them to your primary doctor at your next visit. If any glucose reading is under 80 or above 300 call your primary MD immediately.  If you have Seizure/Convulsions/Epilepsy: Please do not drive, operate heavy machinery, participate in activities at heights or participate in high speed sports until you have seen by Primary MD or a Neurologist and advised to do so again.  If you had Gastrointestinal Bleeding: Please ask your Primary MD to check a complete blood count within one week of discharge or at your next visit. Your endoscopic/colonoscopic biopsies that are pending at the time of discharge, will also need to followed by your Primary MD.  Get Medicines reviewed and adjusted. Please take all your  medications with you for your next visit with your Primary MD  Please request your Primary MD to go over all hospital tests and procedure/radiological results at the follow up, please ask your Primary MD to get all Hospital records sent to his/her office.  If you experience worsening of your admission symptoms, develop shortness of breath, life threatening emergency, suicidal or homicidal thoughts you must seek medical attention immediately by calling 911 or calling your MD immediately  if symptoms less severe.  You must read complete instructions/literature along with all the possible adverse reactions/side effects for all the Medicines you take and that have been prescribed to you. Take any new Medicines after you have completely understood and accpet all the possible adverse reactions/side effects.   Do not drive or operate heavy machinery when taking Pain medications.   Do not take more than prescribed Pain, Sleep and Anxiety Medications  Special Instructions: If you have smoked or chewed Tobacco  in the last 2 yrs please stop smoking, stop any regular Alcohol  and or any Recreational drug use.  Wear Seat belts while driving.  Please note You were cared for by a hospitalist during your hospital stay. If you have any questions about your discharge medications or the care you received while you were in the hospital after you are discharged, you can call the unit and asked to speak with the hospitalist on call if the hospitalist that took care of you is not available. Once you  are discharged, your primary care physician will handle any further medical issues. Please note that NO REFILLS for any discharge medications will be authorized once you are discharged, as it is imperative that you return to your primary care physician (or establish a relationship with a primary care physician if you do not have one) for your aftercare needs so that they can reassess your need for medications and monitor your  lab values.  You can reach the hospitalist office at phone [phone] or fax [phone]   If you do not have a primary care physician, you can call [phone] for a physician referral.  Activity: As tolerated with Full fall precautions use walker/cane & assistance as needed  Diet: heart healthy  Disposition Home

## 2014-12-30 NOTE — Progress Notes (Signed)
Patient was discharged home by MD order; discharged instructions  review and give to patient with care notes and prescriptions; IV DIC; patient will be escorted to the car by nurse tech via wheelchair.  

## 2015-01-06 ENCOUNTER — Ambulatory Visit (INDEPENDENT_AMBULATORY_CARE_PROVIDER_SITE_OTHER): Payer: Medicare Other | Admitting: Family Medicine

## 2015-01-06 ENCOUNTER — Ambulatory Visit (INDEPENDENT_AMBULATORY_CARE_PROVIDER_SITE_OTHER): Payer: Medicare Other

## 2015-01-06 VITALS — BP 107/62 | HR 79 | Temp 98.0°F | Ht 66.0 in | Wt 214.0 lb

## 2015-01-06 DIAGNOSIS — L03115 Cellulitis of right lower limb: Secondary | ICD-10-CM | POA: Diagnosis not present

## 2015-01-06 DIAGNOSIS — C9101 Acute lymphoblastic leukemia, in remission: Secondary | ICD-10-CM

## 2015-01-06 DIAGNOSIS — J432 Centrilobular emphysema: Secondary | ICD-10-CM

## 2015-01-06 DIAGNOSIS — Z794 Long term (current) use of insulin: Secondary | ICD-10-CM | POA: Diagnosis not present

## 2015-01-06 DIAGNOSIS — J189 Pneumonia, unspecified organism: Secondary | ICD-10-CM | POA: Diagnosis not present

## 2015-01-06 DIAGNOSIS — E119 Type 2 diabetes mellitus without complications: Secondary | ICD-10-CM

## 2015-01-06 DIAGNOSIS — J9621 Acute and chronic respiratory failure with hypoxia: Secondary | ICD-10-CM | POA: Diagnosis not present

## 2015-01-06 MED ORDER — TRAZODONE HCL 150 MG PO TABS: ORAL_TABLET | ORAL | Status: DC

## 2015-01-06 NOTE — Patient Instructions (Signed)
Taper Xanax slowly as follows.   You're currently taking 1 tablet twice daily.  You should immediately cut to one half tablet in the morning and 1 tablet in the evening. Do this for 1 month.  At the end of the first month decrease the Xanax to 1 tablet at bedtime. Continue that for one month.  At the end of the second month decrease the Xanax to one half tablet at bedtime. Continue that for one month.  At the end of the third month discontinue the Xanax completely.

## 2015-01-06 NOTE — Progress Notes (Signed)
Subjective:  Patient ID: Derek Shepherd, male    DOB: Jul 07, 1955  Age: 60 y.o. MRN: [id number]  CC: Hospitalization Follow-up   HPI Derek Shepherd presents for INcreasing cough and fever until had to go to hospital on 10/21 due to dyspnea. He was diagnosed with acute exacerbation of his COPD as well as community-acquired pneumonia. Also cellultitis of right  leg. He was in the hospital for several days. He is doing much better. Cough has improved. He/short of breath. Today his pulse ox is in normal range while on oxygen. The cellulitis has become painless and healing nicely without any swelling or drainage. Now in the office for reevaluation for resolution and to resume normal treatment plan etc. He is taking the Symbicort and Spiriva as directed. He uses oxygen as well. He uses 3 L at rest. 5 L when active. And states that when he is on the treadmill trying to do his exercise routine that he has been told to put it up to 10 L. He would like to have a larger concentrator to handle the high flow oxygen. Currently his concentrator will not go above 3-5 L and not for very long at 5. This leads to excessive use of his oxygen tanks and leaves them without tanks for backup.  Patient uses Xanax for sleep. He denies excessive anxiety but does have some. See review of systems.  History Derek Shepherd has a past medical history of Leukemia-lymphoma, T-cell, acute, HTLV-I-associated (HCC); COPD (chronic obstructive pulmonary disease) (HCC); DVT (deep venous thrombosis) (HCC); Personal history of colonic  adenoma (01/22/2008); Anemia; Anxiety; Arthritis; Depression; Gallstones; Bowel obstruction (HCC); Adenomatous colon polyp; Diverticulosis; and CHF (congestive heart failure) (HCC).   He has past surgical history that includes Cholecystectomy; Lung surgery (Left); Exploratory laparotomy; Hernia repair; and Colonoscopy w/ biopsies.   His family history includes Clotting disorder in his mother; Diabetes in his  brother, father, maternal aunt, and sister; Heart attack in his father and mother; Prostate cancer in his brother.He reports that he quit smoking about 21 years ago. His smoking use included Cigarettes. He has never used smokeless tobacco. He reports that he does not drink alcohol or use illicit drugs.  Outpatient Prescriptions Prior to Visit  Medication Sig Dispense Refill  . albuterol (PROVENTIL) (2.5 MG/3ML) 0.083% nebulizer solution Take 3 mLs (2.5 mg total) by nebulization every 6 (six) hours as needed for wheezing or shortness of breath. 75 mL 4  . ALPRAZolam (XANAX) 0.25 MG tablet Take 0.25 mg by mouth 2 (two) times daily.     . budesonide-formoterol (SYMBICORT) 160-4.5 MCG/ACT inhaler Inhale 2 puffs into the lungs every morning. 1 Inhaler 2  . fentaNYL (DURAGESIC - DOSED MCG/HR) 25 MCG/HR patch Place 25 mcg onto the skin every other day.     . folic acid (FOLVITE) 1 MG tablet Take 1 mg by mouth daily.     . furosemide (LASIX) 80 MG tablet Take 1 tablet (80 mg total) by mouth 2 (two) times daily. 60 tablet 1  . lactulose (CHRONULAC) 10 GM/15ML solution Take 10 g by mouth daily as needed for mild constipation.     . Magnesium Chloride-Calcium 64-106 MG TBEC Take 2 tablets by mouth daily.     . Multiple Vitamins tablet Take 1 tablet by mouth daily.     . Naloxone HCl 0.4 MG/0.4ML SOAJ Inject 0.4 mg as directed daily as needed.    . ONE TOUCH ULTRA TEST test strip USE ONE STRIP TO CHECK GLUCOSE ONCE  DAILY IN THE MORNING FOR  FASTING  BLOOD  SUGAR 100 each 0  . OXYGEN Inhale into the lungs. Continuous 5 LPM when up and about 3 LPM constant and at night    . pantoprazole (PROTONIX) 40 MG tablet TAKE ONE TABLET BY MOUTH ONCE DAILY IN THE MORNING 30 tablet 0  . Polyethyl Glycol-Propyl Glycol (SYSTANE PRESERVATIVE FREE) 0.4-0.3 % SOLN Place 1 drop into both eyes daily as needed. For dry eyes    . promethazine (PHENERGAN) 25 MG tablet TAKE ONE TABLET BY MOUTH 4 TIMES DAILY AS NEEDED FOR NAUSEA 30  tablet 2  . Skin Protectants, Misc. (EUCERIN) cream Apply 1 application topically daily.    Marland Kitchen SPIRIVA HANDIHALER 18 MCG inhalation capsule Place 1 capsule (18 mcg total) into inhaler and inhale daily. 30 capsule 5  . tapentadol (NUCYNTA) 50 MG TABS tablet Take 50 mg by mouth every 6 (six) hours as needed for moderate pain.     . vitamin B-12 (CYANOCOBALAMIN) 1000 MCG tablet Take 1,000 mcg by mouth.    . predniSONE (DELTASONE) 10 MG tablet Take 2 tablets (20 mg total) by mouth daily with breakfast. For 3 more days 6 tablet 0  . levofloxacin (LEVAQUIN) 750 MG tablet Take 1 tablet (750 mg total) by mouth daily. (Patient not taking: Reported on 01/06/2015) 5 tablet 0  . potassium chloride (MICRO-K) 10 MEQ CR capsule 30 mEq 2 (two) times daily. Takes 2 capsules daily     No facility-administered medications prior to visit.    ROS Review of Systems  Constitutional: Negative for fever, chills and diaphoresis.  HENT: Negative for congestion, rhinorrhea and sore throat.   Respiratory: Negative for cough, shortness of breath and wheezing.   Cardiovascular: Negative for chest pain.  Gastrointestinal: Negative for nausea, vomiting, abdominal pain, diarrhea, constipation and abdominal distention.  Genitourinary: Negative for dysuria and frequency.  Musculoskeletal: Positive for arthralgias. Negative for joint swelling.  Skin: Positive for wound. Negative for rash.  Neurological: Negative for headaches.  Psychiatric/Behavioral: Positive for sleep disturbance and decreased concentration. Negative for suicidal ideas, hallucinations, behavioral problems, self-injury, dysphoric mood and agitation. The patient is nervous/anxious (t times but overall stable. He is agreeable to tapering off of his Xanax. He does use it twice daily.).     Objective:  BP 107/62 mmHg  Pulse 79  Temp(Src) 98 F (36.7 C) (Oral)  Ht $R'5\' 6"'nI$  (1.676 m)  Wt 214 lb (97.07 kg)  BMI 34.56 kg/m2  SpO2 98%  BP Readings from Last 3  Encounters:  01/06/15 107/62  12/30/14 126/71  12/22/14 145/77    Wt Readings from Last 3 Encounters:  01/06/15 214 lb (97.07 kg)  12/26/14 223 lb (101.152 kg)  12/22/14 224 lb 3.2 oz (101.696 kg)     Physical Exam  Constitutional: He is oriented to person, place, and time. He appears well-developed and well-nourished. No distress.  HENT:  Head: Normocephalic and atraumatic.  Right Ear: External ear normal.  Left Ear: External ear normal.  Nose: Nose normal.  Mouth/Throat: Oropharynx is clear and moist.  Eyes: Conjunctivae and EOM are normal. Pupils are equal, round, and reactive to light.  Neck: Normal range of motion. Neck supple. No thyromegaly present.  Cardiovascular: Normal rate, regular rhythm and normal heart sounds.   No murmur heard. Pulmonary/Chest: No respiratory distress. He has wheezes. He has no rales.  Wearing nasal cannula set at 3 L  Abdominal: Soft. Bowel sounds are normal. He exhibits no distension. There is no tenderness.  Lymphadenopathy:    He has no cervical adenopathy.  Neurological: He is alert and oriented to person, place, and time. He has normal reflexes.  Skin: Skin is warm and dry.  There is a 3 cm residual violaceous patch at the distal aspect of the right patella anteromedially. This has minimal edema but no erythema and is nontender and not draining.  Psychiatric: He has a normal mood and affect. His behavior is normal. Judgment and thought content normal.    Lab Results  Component Value Date   HGBA1C 5.6 12/27/2014   HGBA1C 5.9 12/01/2014   HGBA1C 5.2 05/19/2014    Lab Results  Component Value Date   WBC 6.4 01/06/2015   HGB 8.5* 12/30/2014   HCT 29.2* 01/06/2015   PLT 78* 12/30/2014   GLUCOSE 108* 01/06/2015   CHOL 214* 12/01/2014   TRIG 290* 12/01/2014   HDL 48 12/01/2014   LDLCALC 108* 12/01/2014   ALT 22 01/06/2015   AST 21 01/06/2015   NA 141 01/06/2015   K 4.3 01/06/2015   CL 93* 01/06/2015   CREATININE 0.75*  01/06/2015   BUN 14 01/06/2015   CO2 34* 01/06/2015   PSA 1.0 02/15/2013   INR 1.1* 07/14/2014   HGBA1C 5.6 12/27/2014    Dg Chest 2 View  12/26/2014  CLINICAL DATA:  Shortness of breath. EXAM: CHEST  2 VIEW COMPARISON:  June 21, 2013. FINDINGS: The heart size and mediastinal contours are within normal limits. Both lungs are clear. No pneumothorax or pleural effusion is noted. Right internal jugular Port-A-Cath is noted with distal tip at expected position of cavoatrial junction. Postsurgical changes are seen involving left rib. IMPRESSION: No active cardiopulmonary disease. Electronically Signed   By: Marijo Conception, M.D.   On: 12/26/2014 21:52    Assessment & Plan:   [place] was seen today for hospitalization follow-up.  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 Masullo's predniSONE. I am also having him start on traZODone. Additionally, I am having him maintain his ALPRAZolam, fentaNYL, potassium chloride, lactulose, Multiple Vitamins, Polyethyl  Glycol-Propyl Glycol, folic acid, vitamin C-58, ONE TOUCH ULTRA TEST, promethazine, albuterol, Magnesium Chloride-Calcium, OXYGEN, budesonide-formoterol, tapentadol, SPIRIVA HANDIHALER, pantoprazole, eucerin, Naloxone HCl, furosemide, and levofloxacin.  Meds  ordered this encounter  Medications  . traZODone (DESYREL) 150 MG tablet    Sig: 1 or 2 at bedtime for sleep    Dispense:  60 tablet    Refill:  2   Discussion of the risks of use of Xanax in the setting of end-stage oxygen-dependent COPD were discussed. He is willing to try to taper off that as long as he can have something to help with his sleep. Therefore trazodone above was prescribed and taper for Xanax written.  Taper Xanax slowly as follows.   You're currently taking 1 tablet twice daily.  You should immediately cut to one half tablet in the morning and 1 tablet in the evening. Do this for 1 month.  At the end of the first month decrease the Xanax to 1 tablet at bedtime. Continue that for one month.  At the end of the second month decrease the Xanax to one half tablet at bedtime. Continue that for one month.  At the end of the third month discontinue the Xanax completely.  Follow-up: Return in about 3 months (around 04/08/2015) for COPD.  Claretta Fraise, M.D.

## 2015-01-07 ENCOUNTER — Encounter: Payer: Self-pay | Admitting: Family Medicine

## 2015-01-07 LAB — CBC WITH DIFFERENTIAL/PLATELET
BASOS: 0 %
Basophils Absolute: 0 10*3/uL (ref 0.0–0.2)
EOS (ABSOLUTE): 0.1 10*3/uL (ref 0.0–0.4)
Eos: 1 %
HEMATOCRIT: 29.2 % — AB (ref 37.5–51.0)
Hemoglobin: 9.4 g/dL — ABNORMAL LOW (ref 12.6–17.7)
IMMATURE GRANULOCYTES: 1 %
Immature Grans (Abs): 0 10*3/uL (ref 0.0–0.1)
LYMPHS ABS: 1.4 10*3/uL (ref 0.7–3.1)
Lymphs: 22 %
MCH: 34.1 pg — AB (ref 26.6–33.0)
MCHC: 32.2 g/dL (ref 31.5–35.7)
MCV: 106 fL — AB (ref 79–97)
MONOS ABS: 0.5 10*3/uL (ref 0.1–0.9)
Monocytes: 7 %
NEUTROS ABS: 4.5 10*3/uL (ref 1.4–7.0)
Neutrophils: 69 %
Platelets: 117 10*3/uL — ABNORMAL LOW (ref 150–379)
RBC: 2.76 x10E6/uL — ABNORMAL LOW (ref 4.14–5.80)
RDW: 16.5 % — AB (ref 12.3–15.4)
WBC: 6.4 10*3/uL (ref 3.4–10.8)

## 2015-01-07 LAB — CMP14+EGFR
ALT: 22 IU/L (ref 0–44)
AST: 21 IU/L (ref 0–40)
Albumin/Globulin Ratio: 2 (ref 1.1–2.5)
Albumin: 4 g/dL (ref 3.5–5.5)
Alkaline Phosphatase: 75 IU/L (ref 39–117)
BUN/Creatinine Ratio: 19 (ref 9–20)
BUN: 14 mg/dL (ref 6–24)
Bilirubin Total: <0.2 mg/dL (ref 0.0–1.2)
CO2: 34 mmol/L — ABNORMAL HIGH (ref 18–29)
Calcium: 8.7 mg/dL (ref 8.7–10.2)
Chloride: 93 mmol/L — ABNORMAL LOW (ref 97–106)
Creatinine, Ser: 0.75 mg/dL — ABNORMAL LOW (ref 0.76–1.27)
GFR calc Af Amer: 118 mL/min/1.73
GFR calc non Af Amer: 102 mL/min/1.73
Globulin, Total: 2 g/dL (ref 1.5–4.5)
Glucose: 108 mg/dL — ABNORMAL HIGH (ref 65–99)
Potassium: 4.3 mmol/L (ref 3.5–5.2)
Sodium: 141 mmol/L (ref 136–144)
Total Protein: 6 g/dL (ref 6.0–8.5)

## 2015-01-08 LAB — SPECIMEN STATUS REPORT

## 2015-01-08 LAB — B12 AND FOLATE PANEL
Folate: >20 ng/mL (ref >3.0)
Vitamin B-12: 671 pg/mL (ref 211–946)

## 2015-01-09 ENCOUNTER — Telehealth: Payer: Self-pay | Admitting: *Deleted

## 2015-01-09 NOTE — Telephone Encounter (Signed)
Tye [place] the home health nurse called said he weighed 200.2 pounds on Tuesday and weighs 209lbs today, but only has .5 cm bigger on ankles. He is taking 80mg  of lasix BID. Pt also states he is constipated and hasn't had a BM in a few days but did take his lactulose now. Is there anything that needs to be changed?

## 2015-01-12 ENCOUNTER — Encounter: Payer: Self-pay | Admitting: Family Medicine

## 2015-01-12 ENCOUNTER — Ambulatory Visit: Payer: Medicare Other | Admitting: Neurology

## 2015-01-12 ENCOUNTER — Telehealth: Payer: Self-pay | Admitting: *Deleted

## 2015-01-12 ENCOUNTER — Ambulatory Visit (INDEPENDENT_AMBULATORY_CARE_PROVIDER_SITE_OTHER): Payer: Medicare Other | Admitting: Family Medicine

## 2015-01-12 VITALS — BP 108/55 | HR 64 | Temp 97.5°F | Ht 66.0 in | Wt 221.0 lb

## 2015-01-12 DIAGNOSIS — R601 Generalized edema: Secondary | ICD-10-CM

## 2015-01-12 DIAGNOSIS — R0600 Dyspnea, unspecified: Secondary | ICD-10-CM | POA: Diagnosis not present

## 2015-01-12 MED ORDER — TORSEMIDE 100 MG PO TABS: 100.0000 mg | ORAL_TABLET | Freq: Every day | ORAL | Status: DC

## 2015-01-12 NOTE — Telephone Encounter (Signed)
[facility] nurse called, His weight has went from 200.7 on 01/04/15 to 211.2 today. Wife called them and says he needs something for the fluid. We would like to keep him out of hospital. Oral meds, IV meds by [facility], etc. Please advise

## 2015-01-12 NOTE — Telephone Encounter (Signed)
Have him use magnesium citrate 10 oz once, and miralax 17 gm (capful) BID until BMs are regular.

## 2015-01-12 NOTE — Progress Notes (Signed)
Subjective:  Patient ID: TEMPLE SPORER, male    DOB: [birth year]  Age: 59 y.o. MRN: [id number]  CC: Weight Gain   HPI JAKSON DELPILAR presents for gained 10 lb on home scale since he was here last week. Also feeling more dyspneic. Feels bloated and feels legs have swollen. Moderate dypnea. No cough.   History Naif has a past medical history of Leukemia-lymphoma, T-cell, acute, HTLV-I-associated ([place]); COPD (chronic obstructive pulmonary disease) ([place]); DVT (deep venous thrombosis) ([place]); Personal history of colonic  adenoma (01/22/2008); Anemia; Anxiety; Arthritis; Depression; Gallstones; Bowel obstruction ([place]); Adenomatous colon polyp; Diverticulosis; and CHF (congestive heart failure) ([place]).   He has past surgical history that includes Cholecystectomy; Lung surgery (Left); Exploratory laparotomy; Hernia repair; and Colonoscopy w/ biopsies.   His family history includes Clotting disorder in his mother; Diabetes in his brother, father, maternal aunt, and sister; Heart attack in his father and mother; Prostate cancer in his brother.He reports that he quit smoking about 21 years ago. His smoking use included Cigarettes. He has never used smokeless tobacco. He reports that he does not drink alcohol or use illicit drugs.  Outpatient Prescriptions Prior to Visit  Medication Sig Dispense Refill  . albuterol (PROVENTIL) (2.5 MG/3ML) 0.083% nebulizer solution Take 3 mLs (2.5 mg total) by nebulization every 6 (six) hours as needed for wheezing or shortness of breath. 75 mL 4  . ALPRAZolam (XANAX) 0.25 MG tablet Take 0.25 mg by mouth 2 (two) times daily.     . budesonide-formoterol (SYMBICORT) 160-4.5 MCG/ACT inhaler Inhale 2 puffs into the lungs every morning. 1 Inhaler 2  . fentaNYL (DURAGESIC - DOSED MCG/HR) 25 MCG/HR patch Place 25 mcg onto the skin every other day.     . folic acid (FOLVITE) 1 MG tablet Take 1 mg by mouth daily.     Marland Kitchen lactulose (CHRONULAC) 10 GM/15ML solution Take 10 g by  mouth daily as needed for mild constipation.     . Magnesium Chloride-Calcium 64-106 MG TBEC Take 2 tablets by mouth daily.     . Multiple Vitamins tablet Take 1 tablet by mouth daily.     . Naloxone HCl 0.4 MG/0.4ML SOAJ Inject 0.4 mg as directed daily as needed.    . ONE TOUCH ULTRA TEST test strip USE ONE STRIP TO CHECK GLUCOSE ONCE DAILY IN THE MORNING FOR  FASTING  BLOOD  SUGAR 100 each 0  . OXYGEN Inhale into the lungs. Continuous 5 LPM when up and about 3 LPM constant and at night    . pantoprazole (PROTONIX) 40 MG tablet TAKE ONE TABLET BY MOUTH ONCE DAILY IN THE MORNING 30 tablet 0  . Polyethyl Glycol-Propyl Glycol (SYSTANE PRESERVATIVE FREE) 0.4-0.3 % SOLN Place 1 drop into both eyes daily as needed. For dry eyes    . promethazine (PHENERGAN) 25 MG tablet TAKE ONE TABLET BY MOUTH 4 TIMES DAILY AS NEEDED FOR NAUSEA 30 tablet 2  . Skin Protectants, Misc. (EUCERIN) cream Apply 1 application topically daily.    Marland Kitchen SPIRIVA HANDIHALER 18 MCG inhalation capsule Place 1 capsule (18 mcg total) into inhaler and inhale daily. 30 capsule 5  . traZODone (DESYREL) 150 MG tablet 1 or 2 at bedtime for sleep 60 tablet 2  . vitamin B-12 (CYANOCOBALAMIN) 1000 MCG tablet Take 1,000 mcg by mouth.    . furosemide (LASIX) 80 MG tablet Take 1 tablet (80 mg total) by mouth 2 (two) times daily. 60 tablet 1  . levofloxacin (LEVAQUIN) 750 MG tablet Take 1  tablet (750 mg total) by mouth daily. 5 tablet 0  . potassium chloride (MICRO-K) 10 MEQ CR capsule 30 mEq 2 (two) times daily. Takes 2 capsules daily     No facility-administered medications prior to visit.    ROS Review of Systems  Constitutional: Negative for fever, chills and diaphoresis.  HENT: Negative for congestion, rhinorrhea and sore throat.   Respiratory: Positive for shortness of breath. Negative for cough.   Cardiovascular: Positive for leg swelling. Negative for chest pain.  Gastrointestinal: Negative for nausea, vomiting and abdominal pain.    Musculoskeletal: Negative for arthralgias.  Skin: Negative for rash.  Neurological: Positive for weakness (diffuse, nonfocal). Negative for headaches.    Objective:  BP 108/55 mmHg  Pulse 64  Temp(Src) 97.5 F (36.4 C) (Oral)  Ht _0  (1.676 m)  Wt 221 lb (100.245 kg)  BMI 35.69 kg/m2  SpO2 98%  BP Readings from Last 3 Encounters:  01/12/15 108/55  01/06/15 107/62  12/30/14 126/71    Wt Readings from Last 3 Encounters:  01/12/15 221 lb (100.245 kg)  01/06/15 214 lb (97.07 kg)  12/26/14 223 lb (101.152 kg)     Physical Exam  Lab Results  Component Value Date   HGBA1C 5.6 12/27/2014   HGBA1C 5.9 12/01/2014   HGBA1C 5.2 05/19/2014    Lab Results  Component Value Date   WBC 4.3 01/12/2015   HGB 8.5* 12/30/2014   HCT 25.8* 01/12/2015   PLT 78* 12/30/2014   GLUCOSE 110* 01/12/2015   CHOL 214* 12/01/2014   TRIG 290* 12/01/2014   HDL 48 12/01/2014   LDLCALC 108* 12/01/2014   ALT 24 01/12/2015   AST 27 01/12/2015   NA 144 01/12/2015   K 3.9 01/12/2015   CL 98 01/12/2015   CREATININE 0.67* 01/12/2015   BUN 10 01/12/2015   CO2 33* 01/12/2015   PSA 1.0 02/15/2013   INR 1.1* 07/14/2014   HGBA1C 5.6 12/27/2014    Dg Chest 2 View  12/26/2014  CLINICAL DATA:  Shortness of breath. EXAM: CHEST  2 VIEW COMPARISON:  June 21, 2013. FINDINGS: The heart size and mediastinal contours are within normal limits. Both lungs are clear. No pneumothorax or pleural effusion is noted. Right internal jugular Port-A-Cath is noted with distal tip at expected position of cavoatrial junction. Postsurgical changes are seen involving left rib. IMPRESSION: No active cardiopulmonary disease. Electronically Signed   By: Marijo Conception, M.D.   On: 12/26/2014 21:52    Assessment & Plan:   Wake was seen today for weight gain.  Diagnoses and all orders for this visit:  Generalized edema -     Brain natriuretic peptide -     CMP14+EGFR -     CBC with  Differential/Platelet  Dyspnea -     Brain natriuretic peptide -     CMP14+EGFR -     CBC with Differential/Platelet  Other orders -     torsemide (DEMADEX) 100 MG tablet; Take 1 tablet (100 mg total) by mouth daily.   I have discontinued Mr. Riche's furosemide and levofloxacin. I am also having him start on torsemide. Additionally, I am having him maintain his ALPRAZolam, fentaNYL, potassium chloride, lactulose, Multiple Vitamins, Polyethyl Glycol-Propyl Glycol, folic acid, vitamin Z-16, ONE TOUCH ULTRA TEST, promethazine, albuterol, Magnesium Chloride-Calcium, OXYGEN, budesonide-formoterol, SPIRIVA HANDIHALER, pantoprazole, eucerin, Naloxone HCl, traZODone, and tapentadol.  Meds ordered this encounter  Medications  . tapentadol (NUCYNTA) 50 MG TABS tablet    Sig: Take 50 mg by mouth.  Marland Kitchen  DISCONTD: fentaNYL (DURAGESIC - DOSED MCG/HR) 25 MCG/HR patch    Sig: Place 1 patch onto the skin.  Marland Kitchen torsemide (DEMADEX) 100 MG tablet    Sig: Take 1 tablet (100 mg total) by mouth daily.    Dispense:  30 tablet    Refill:  1     Follow-up: Return in about 1 month (around 02/11/2015), or if symptoms worsen or fail to improve.  Claretta Fraise, M.D.

## 2015-01-12 NOTE — Telephone Encounter (Signed)
Pt came in for appt. 

## 2015-01-12 NOTE — Telephone Encounter (Signed)
I need to see him. Have him come in at 2:45. Thanks, WS

## 2015-01-13 LAB — CMP14+EGFR
ALK PHOS: 73 IU/L (ref 39–117)
ALT: 24 IU/L (ref 0–44)
AST: 27 IU/L (ref 0–40)
Albumin/Globulin Ratio: 2.5 (ref 1.1–2.5)
Albumin: 3.7 g/dL (ref 3.5–5.5)
BUN/Creatinine Ratio: 15 (ref 9–20)
BUN: 10 mg/dL (ref 6–24)
CHLORIDE: 98 mmol/L (ref 97–106)
CO2: 33 mmol/L — AB (ref 18–29)
CREATININE: 0.67 mg/dL — AB (ref 0.76–1.27)
Calcium: 8.8 mg/dL (ref 8.7–10.2)
GFR calc Af Amer: 123 mL/min/{1.73_m2} (ref >59)
GFR calc non Af Amer: 107 mL/min/{1.73_m2} (ref >59)
GLUCOSE: 110 mg/dL — AB (ref 65–99)
Globulin, Total: 1.5 g/dL (ref 1.5–4.5)
Potassium: 3.9 mmol/L (ref 3.5–5.2)
Sodium: 144 mmol/L (ref 136–144)
Total Protein: 5.2 g/dL — ABNORMAL LOW (ref 6.0–8.5)

## 2015-01-13 LAB — CBC WITH DIFFERENTIAL/PLATELET
BASOS ABS: 0 10*3/uL (ref 0.0–0.2)
Basos: 0 %
EOS (ABSOLUTE): 0.1 10*3/uL (ref 0.0–0.4)
EOS: 2 %
HEMOGLOBIN: 8.4 g/dL — AB (ref 12.6–17.7)
Hematocrit: 25.8 % — ABNORMAL LOW (ref 37.5–51.0)
IMMATURE GRANULOCYTES: 0 %
Immature Grans (Abs): 0 10*3/uL (ref 0.0–0.1)
LYMPHS ABS: 1.3 10*3/uL (ref 0.7–3.1)
Lymphs: 30 %
MCH: 34.6 pg — ABNORMAL HIGH (ref 26.6–33.0)
MCHC: 32.6 g/dL (ref 31.5–35.7)
MCV: 106 fL — ABNORMAL HIGH (ref 79–97)
MONOCYTES: 7 %
MONOS ABS: 0.3 10*3/uL (ref 0.1–0.9)
NEUTROS PCT: 61 %
Neutrophils Absolute: 2.7 10*3/uL (ref 1.4–7.0)
PLATELETS: 83 10*3/uL — AB (ref 150–379)
RBC: 2.43 x10E6/uL — AB (ref 4.14–5.80)
RDW: 16.1 % — ABNORMAL HIGH (ref 12.3–15.4)
WBC: 4.3 10*3/uL (ref 3.4–10.8)

## 2015-01-13 LAB — BRAIN NATRIURETIC PEPTIDE: BNP: 96.7 pg/mL (ref 0.0–100.0)

## 2015-01-15 ENCOUNTER — Telehealth: Payer: Self-pay | Admitting: Family Medicine

## 2015-01-15 NOTE — Telephone Encounter (Signed)
Reviewed results with pt's wife.

## 2015-01-26 ENCOUNTER — Encounter: Payer: Self-pay | Admitting: Family Medicine

## 2015-01-26 ENCOUNTER — Ambulatory Visit (INDEPENDENT_AMBULATORY_CARE_PROVIDER_SITE_OTHER): Payer: Medicare Other | Admitting: Family Medicine

## 2015-01-26 VITALS — BP 121/66 | HR 85 | Temp 98.5°F | Ht 66.0 in | Wt 212.0 lb

## 2015-01-26 DIAGNOSIS — R143 Flatulence: Secondary | ICD-10-CM

## 2015-01-26 DIAGNOSIS — K5909 Other constipation: Secondary | ICD-10-CM

## 2015-01-26 DIAGNOSIS — G44219 Episodic tension-type headache, not intractable: Secondary | ICD-10-CM

## 2015-01-26 MED ORDER — LINACLOTIDE 290 MCG PO CAPS: 290.0000 ug | ORAL_CAPSULE | Freq: Every day | ORAL | Status: DC

## 2015-01-26 MED ORDER — CYCLOBENZAPRINE HCL 10 MG PO TABS: 10.0000 mg | ORAL_TABLET | Freq: Three times a day (TID) | ORAL | Status: DC | PRN

## 2015-01-26 NOTE — Progress Notes (Signed)
Subjective:  Patient ID: Derek Shepherd, male    DOB: 09/02/1955  Age: 59 y.o. MRN: [id number]  CC: Headache and Bloated   HPI Derek Shepherd presents for frontal HA for  3 weeks. Pressure. Moderate pain is intermittent. Vision unaffected. Glucose running 103-105 fasting. Awakens him from sleep and night.  History Derek Shepherd has a past medical history of Leukemia-lymphoma, T-cell, acute, HTLV-I-associated ([place]); COPD (chronic obstructive pulmonary disease) ([place]); DVT (deep venous thrombosis) ([place]); Personal history of colonic  adenoma (01/22/2008); Anemia; Anxiety; Arthritis; Depression; Gallstones; Bowel obstruction ([place]); Adenomatous colon polyp; Diverticulosis; and CHF (congestive heart failure) ([place]).   He has past surgical history that includes Cholecystectomy; Lung surgery (Left); Exploratory laparotomy; Hernia repair; and Colonoscopy w/ biopsies.   His family history includes Clotting disorder in his mother; Diabetes in his brother, father, maternal aunt, and sister; Heart attack in his father and mother; Prostate cancer in his brother.He reports that he quit smoking about 21 years ago. His smoking use included Cigarettes. He has never used smokeless tobacco. He reports that he does not drink alcohol or use illicit drugs.  Outpatient Prescriptions Prior to Visit  Medication Sig Dispense Refill  . albuterol (PROVENTIL) (2.5 MG/3ML) 0.083% nebulizer solution Take 3 mLs (2.5 mg total) by nebulization every 6 (six) hours as needed for wheezing or shortness of breath. 75 mL 4  . ALPRAZolam (XANAX) 0.25 MG tablet Take 0.25 mg by mouth 2 (two) times daily.     . budesonide-formoterol (SYMBICORT) 160-4.5 MCG/ACT inhaler Inhale 2 puffs into the lungs every morning. 1 Inhaler 2  . fentaNYL (DURAGESIC - DOSED MCG/HR) 25 MCG/HR patch Place 25 mcg onto the skin every other day.     . folic acid (FOLVITE) 1 MG tablet Take 1 mg by mouth daily.     Marland Kitchen lactulose (CHRONULAC) 10 GM/15ML solution Take 10  g by mouth daily as needed for mild constipation.     . Magnesium Chloride-Calcium 64-106 MG TBEC Take 2 tablets by mouth daily.     . Multiple Vitamins tablet Take 1 tablet by mouth daily.     . ONE TOUCH ULTRA TEST test strip USE ONE STRIP TO CHECK GLUCOSE ONCE DAILY IN THE MORNING FOR  FASTING  BLOOD  SUGAR 100 each 0  . OXYGEN Inhale into the lungs. Continuous 5 LPM when up and about 3 LPM constant and at night    . pantoprazole (PROTONIX) 40 MG tablet TAKE ONE TABLET BY MOUTH ONCE DAILY IN THE MORNING 30 tablet 0  . Polyethyl Glycol-Propyl Glycol (SYSTANE PRESERVATIVE FREE) 0.4-0.3 % SOLN Place 1 drop into both eyes daily as needed. For dry eyes    . promethazine (PHENERGAN) 25 MG tablet TAKE ONE TABLET BY MOUTH 4 TIMES DAILY AS NEEDED FOR NAUSEA 30 tablet 2  . Skin Protectants, Misc. (EUCERIN) cream Apply 1 application topically daily.    Marland Kitchen SPIRIVA HANDIHALER 18 MCG inhalation capsule Place 1 capsule (18 mcg total) into inhaler and inhale daily. 30 capsule 5  . [START ON 03/19/2015] tapentadol (NUCYNTA) 50 MG TABS tablet Take 50 mg by mouth.    . torsemide (DEMADEX) 100 MG tablet Take 1 tablet (100 mg total) by mouth daily. 30 tablet 1  . traZODone (DESYREL) 150 MG tablet 1 or 2 at bedtime for sleep 60 tablet 2  . vitamin B-12 (CYANOCOBALAMIN) 1000 MCG tablet Take 1,000 mcg by mouth.    . Naloxone HCl 0.4 MG/0.4ML SOAJ Inject 0.4 mg as directed daily as needed.    Marland Kitchen  potassium chloride (MICRO-K) 10 MEQ CR capsule 30 mEq 2 (two) times daily. Takes 2 capsules daily     No facility-administered medications prior to visit.    ROS Review of Systems  Constitutional: Negative for fever, chills and diaphoresis.  HENT: Negative for rhinorrhea and sore throat.   Respiratory: Negative for cough and shortness of breath.   Cardiovascular: Negative for chest pain.  Gastrointestinal: Positive for abdominal pain and constipation.  Musculoskeletal: Negative for myalgias and arthralgias.  Skin:  Negative for rash.  Neurological: Positive for headaches. Negative for dizziness, tremors, syncope, speech difficulty, weakness and numbness.    Objective:  BP 121/66 mmHg  Pulse 85  Temp(Src) 98.5 F (36.9 C) (Oral)  Ht 5\' 6"  (1.676 m)  Wt 212 lb (96.163 kg)  BMI 34.23 kg/m2  SpO2 95%  BP Readings from Last 3 Encounters:  01/26/15 121/66  01/12/15 108/55  01/06/15 107/62    Wt Readings from Last 3 Encounters:  01/26/15 212 lb (96.163 kg)  01/12/15 221 lb (100.245 kg)  01/06/15 214 lb (97.07 kg)     Physical Exam  Constitutional: He is oriented to person, place, and time. He appears well-developed and well-nourished. No distress.  HENT:  Head: Normocephalic and atraumatic.  Right Ear: External ear normal.  Left Ear: External ear normal.  Nose: Nose normal.  Mouth/Throat: Oropharynx is clear and moist.  Eyes: Conjunctivae and EOM are normal. Pupils are equal, round, and reactive to light.  Neck: Normal range of motion. Neck supple. No thyromegaly present.  Cardiovascular: Normal rate, regular rhythm and normal heart sounds.   No murmur heard. Pulmonary/Chest: Effort normal and breath sounds normal. No respiratory distress. He has no wheezes. He has no rales.  Abdominal: Soft. Bowel sounds are normal. He exhibits no distension. There is no tenderness.  Musculoskeletal: Normal range of motion.  Lymphadenopathy:    He has no cervical adenopathy.  Neurological: He is alert and oriented to person, place, and time. He has normal reflexes.  Skin: Skin is warm and dry.  Psychiatric: He has a normal mood and affect. His behavior is normal. Judgment and thought content normal.    Lab Results  Component Value Date   HGBA1C 5.6 12/27/2014   HGBA1C 5.9 12/01/2014   HGBA1C 5.2 05/19/2014    Lab Results  Component Value Date   WBC 4.3 01/12/2015   HGB 8.5* 12/30/2014   HCT 25.8* 01/12/2015   PLT 78* 12/30/2014   GLUCOSE 110* 01/12/2015   CHOL 214* 12/01/2014   TRIG  290* 12/01/2014   HDL 48 12/01/2014   LDLCALC 108* 12/01/2014   ALT 24 01/12/2015   AST 27 01/12/2015   NA 144 01/12/2015   K 3.9 01/12/2015   CL 98 01/12/2015   CREATININE 0.67* 01/12/2015   BUN 10 01/12/2015   CO2 33* 01/12/2015   PSA 1.0 02/15/2013   INR 1.1* 07/14/2014   HGBA1C 5.6 12/27/2014    Dg Chest 2 View  12/26/2014  CLINICAL DATA:  Shortness of breath. EXAM: CHEST  2 VIEW COMPARISON:  June 21, 2013. FINDINGS: The heart size and mediastinal contours are within normal limits. Both lungs are clear. No pneumothorax or pleural effusion is noted. Right internal jugular Port-A-Cath is noted with distal tip at expected position of cavoatrial junction. Postsurgical changes are seen involving left rib. IMPRESSION: No active cardiopulmonary disease. Electronically Signed   By: Marijo Conception, M.D.   On: 12/26/2014 21:52    Assessment & Plan:   Ovid Curd  was seen today for headache and bloated.  Diagnoses and all orders for this visit:  Episodic tension-type headache, not intractable  Flatulence  Other constipation  Other orders -     cyclobenzaprine (FLEXERIL) 10 MG tablet; Take 1 tablet (10 mg total) by mouth 3 (three) times daily as needed. For Headache -     Discontinue: Linaclotide (LINZESS) 290 MCG CAPS capsule; Take 1 capsule (290 mcg total) by mouth daily. For gas and constipation   I am having Mr. Walts start on cyclobenzaprine. I am also having him maintain his ALPRAZolam, fentaNYL, potassium chloride, lactulose, Multiple Vitamins, Polyethyl Glycol-Propyl Glycol, folic acid, vitamin 0000000, ONE TOUCH ULTRA TEST, promethazine, albuterol, Magnesium Chloride-Calcium, OXYGEN, budesonide-formoterol, SPIRIVA HANDIHALER, pantoprazole, eucerin, Naloxone HCl, traZODone, tapentadol, and torsemide.  Meds ordered this encounter  Medications  . cyclobenzaprine (FLEXERIL) 10 MG tablet    Sig: Take 1 tablet (10 mg total) by mouth 3 (three) times daily as needed. For Headache     Dispense:  30 tablet    Refill:  2  . DISCONTD: Linaclotide (LINZESS) 290 MCG CAPS capsule    Sig: Take 1 capsule (290 mcg total) by mouth daily. For gas and constipation    Dispense:  30 capsule    Refill:  2     Follow-up: Return if symptoms worsen or fail to improve.  Claretta Fraise, M.D.

## 2015-01-27 ENCOUNTER — Telehealth: Payer: Self-pay | Admitting: Family Medicine

## 2015-01-27 MED ORDER — POLYETHYLENE GLYCOL 3350 17 GM/SCOOP PO POWD: 17.0000 g | Freq: Two times a day (BID) | ORAL | Status: DC | PRN

## 2015-01-27 NOTE — Telephone Encounter (Signed)
Have him use MiraLAX/GlycoLax 17 g by mouth twice a day until constipation and abdominal discomfort resolve.

## 2015-01-27 NOTE — Telephone Encounter (Signed)
Patient states that insurance will not pay for linzess. Please advise

## 2015-01-27 NOTE — Telephone Encounter (Signed)
Patient informed. 

## 2015-01-30 ENCOUNTER — Telehealth: Payer: Self-pay

## 2015-01-30 NOTE — Telephone Encounter (Signed)
Only because of expense. If P.A. Comes through will need it for sure. Thanks! WS

## 2015-01-30 NOTE — Telephone Encounter (Signed)
I am getting a prior authorization for Linzess but I think you discontinued this med last visit?????

## 2015-02-02 ENCOUNTER — Telehealth: Payer: Self-pay

## 2015-02-02 ENCOUNTER — Other Ambulatory Visit: Payer: Self-pay | Admitting: Family Medicine

## 2015-02-02 MED ORDER — LINACLOTIDE 290 MCG PO CAPS: 290.0000 ug | ORAL_CAPSULE | Freq: Every day | ORAL | Status: DC

## 2015-02-02 NOTE — Telephone Encounter (Signed)
Thanks! Will take care of it now. WS 

## 2015-02-02 NOTE — Telephone Encounter (Signed)
Insurance prior authorized Linzess for patient so you can go ahead and prescribe now

## 2015-02-04 ENCOUNTER — Telehealth: Payer: Self-pay

## 2015-02-04 ENCOUNTER — Ambulatory Visit (INDEPENDENT_AMBULATORY_CARE_PROVIDER_SITE_OTHER): Payer: Medicare Other | Admitting: Family Medicine

## 2015-02-04 DIAGNOSIS — J9611 Chronic respiratory failure with hypoxia: Secondary | ICD-10-CM | POA: Diagnosis not present

## 2015-02-04 DIAGNOSIS — J441 Chronic obstructive pulmonary disease with (acute) exacerbation: Secondary | ICD-10-CM

## 2015-02-04 DIAGNOSIS — J440 Chronic obstructive pulmonary disease with acute lower respiratory infection: Secondary | ICD-10-CM

## 2015-02-04 DIAGNOSIS — J209 Acute bronchitis, unspecified: Secondary | ICD-10-CM

## 2015-02-04 NOTE — Telephone Encounter (Signed)
Filled out Cover My Meds online form for pantoprazole 40mg  tablets,one daily, dx-GERD, K21.9.

## 2015-02-05 ENCOUNTER — Telehealth: Payer: Self-pay | Admitting: Physician Assistant

## 2015-02-05 NOTE — Telephone Encounter (Signed)
Spoke with Derek Shepherd and told her he needs #30 for 30 days.  They will let us know of approval or disapproval soon she said.

## 2015-02-05 NOTE — Telephone Encounter (Signed)
[facility] called and let us know that no prior authorization needed for patient's pantoprazole.

## 2015-02-05 NOTE — Telephone Encounter (Signed)
Derek Shepherd from cover my meds needs additional clinical info. She is wanting to know if this should be 60 tabs per 30 days. Best # [phone]

## 2015-02-12 ENCOUNTER — Telehealth: Payer: Self-pay | Admitting: Family Medicine

## 2015-02-13 ENCOUNTER — Ambulatory Visit (INDEPENDENT_AMBULATORY_CARE_PROVIDER_SITE_OTHER): Payer: Medicare Other | Admitting: Family Medicine

## 2015-02-13 ENCOUNTER — Encounter: Payer: Self-pay | Admitting: Family Medicine

## 2015-02-13 VITALS — BP 117/62 | HR 69 | Temp 97.1°F | Wt 223.8 lb

## 2015-02-13 DIAGNOSIS — J432 Centrilobular emphysema: Secondary | ICD-10-CM | POA: Diagnosis not present

## 2015-02-13 DIAGNOSIS — I509 Heart failure, unspecified: Secondary | ICD-10-CM

## 2015-02-13 DIAGNOSIS — R600 Localized edema: Secondary | ICD-10-CM

## 2015-02-13 MED ORDER — ALPRAZOLAM 0.25 MG PO TABS: 0.1250 mg | ORAL_TABLET | Freq: Every day | ORAL | Status: DC

## 2015-02-13 NOTE — Telephone Encounter (Signed)
Patient has had a 9lb weight gain this week. Patient states that Dr Livia Snellen advised him that he NTBS with any weight gain. Appt given for today at 2:40

## 2015-02-13 NOTE — Progress Notes (Signed)
Subjective:  Patient ID: Derek Shepherd, male    DOB: 10/29/1955  Age: 59 y.o. MRN: [id number]  CC: wt gain   HPI MANSOOR HILLYARD presents for concern about swelling in the ankles and feet and legs. He is put on about 10 pounds since his last visit. He is concerned this represents worsening of his condition. He is having some increase in his shortness of breath. He is able to lay down but has two-pillow orthopnea periodically. Blood sugars have been stable when checked at home. No excessive highs or hypoglycemic spells. Meds reviewed as below.  History Naftoli has a past medical history of Leukemia-lymphoma, T-cell, acute, HTLV-I-associated ([place]); COPD (chronic obstructive pulmonary disease) ([place]); DVT (deep venous thrombosis) ([place]); Personal history of colonic  adenoma (01/22/2008); Anemia; Anxiety; Arthritis; Depression; Gallstones; Bowel obstruction ([place]); Adenomatous colon polyp; Diverticulosis; CHF (congestive heart failure) ([place]); and GERD (gastroesophageal reflux disease).   He has past surgical history that includes Cholecystectomy; Lung surgery (Left); Exploratory laparotomy; Hernia repair; and Colonoscopy w/ biopsies.   His family history includes Clotting disorder in his mother; Diabetes in his brother, father, maternal aunt, and sister; Heart attack in his father and mother; Prostate cancer in his brother.He reports that he quit smoking about 21 years ago. His smoking use included Cigarettes. He has never used smokeless tobacco. He reports that he does not drink alcohol or use illicit drugs.  Outpatient Prescriptions Prior to Visit  Medication Sig Dispense Refill  . albuterol (PROVENTIL) (2.5 MG/3ML) 0.083% nebulizer solution Take 3 mLs (2.5 mg total) by nebulization every 6 (six) hours as needed for wheezing or shortness of breath. 75 mL 4  . budesonide-formoterol (SYMBICORT) 160-4.5 MCG/ACT inhaler Inhale 2 puffs into the lungs every morning. 1 Inhaler 2  . cyclobenzaprine  (FLEXERIL) 10 MG tablet Take 1 tablet (10 mg total) by mouth 3 (three) times daily as needed. For Headache 30 tablet 2  . fentaNYL (DURAGESIC - DOSED MCG/HR) 25 MCG/HR patch Place 25 mcg onto the skin every other day.     . folic acid (FOLVITE) 1 MG tablet Take 1 mg by mouth daily.     Marland Kitchen lactulose (CHRONULAC) 10 GM/15ML solution Take 10 g by mouth daily as needed for mild constipation.     . Linaclotide (LINZESS) 290 MCG CAPS capsule Take 1 capsule (290 mcg total) by mouth daily. 30 capsule 11  . Magnesium Chloride-Calcium 64-106 MG TBEC Take 2 tablets by mouth daily.     . Multiple Vitamins tablet Take 1 tablet by mouth daily.     . Naloxone HCl 0.4 MG/0.4ML SOAJ Inject 0.4 mg as directed daily as needed.    . ONE TOUCH ULTRA TEST test strip USE ONE STRIP TO CHECK GLUCOSE ONCE DAILY IN THE MORNING FOR  FASTING  BLOOD  SUGAR 100 each 0  . OXYGEN Inhale into the lungs. Continuous 5 LPM when up and about 3 LPM constant and at night    . pantoprazole (PROTONIX) 40 MG tablet TAKE ONE TABLET BY MOUTH ONCE DAILY IN THE MORNING 30 tablet 0  . Polyethyl Glycol-Propyl Glycol (SYSTANE PRESERVATIVE FREE) 0.4-0.3 % SOLN Place 1 drop into both eyes daily as needed. For dry eyes    . polyethylene glycol powder (GLYCOLAX/MIRALAX) powder Take 17 g by mouth 2 (two) times daily as needed. 3350 g 1  . promethazine (PHENERGAN) 25 MG tablet TAKE ONE TABLET BY MOUTH 4 TIMES DAILY AS NEEDED FOR NAUSEA 30 tablet 2  . Skin Protectants, Misc. (  EUCERIN) cream Apply 1 application topically daily.    Marland Kitchen SPIRIVA HANDIHALER 18 MCG inhalation capsule Place 1 capsule (18 mcg total) into inhaler and inhale daily. 30 capsule 5  . [START ON 03/19/2015] tapentadol (NUCYNTA) 50 MG TABS tablet Take 50 mg by mouth.    . torsemide (DEMADEX) 100 MG tablet Take 1 tablet (100 mg total) by mouth daily. 30 tablet 1  . traZODone (DESYREL) 150 MG tablet 1 or 2 at bedtime for sleep 60 tablet 2  . vitamin B-12 (CYANOCOBALAMIN) 1000 MCG tablet Take  1,000 mcg by mouth.    . ALPRAZolam (XANAX) 0.25 MG tablet Take 0.25 mg by mouth 2 (two) times daily.     . potassium chloride (MICRO-K) 10 MEQ CR capsule 30 mEq 2 (two) times daily. Takes 2 capsules daily     No facility-administered medications prior to visit.    ROS Review of Systems  Constitutional: Negative for fever, chills, diaphoresis and unexpected weight change.  HENT: Negative for congestion, hearing loss, rhinorrhea and sore throat.   Eyes: Negative for visual disturbance.  Respiratory: Negative for cough and shortness of breath.   Cardiovascular: Negative for chest pain.  Gastrointestinal: Negative for abdominal pain, diarrhea and constipation.  Genitourinary: Negative for dysuria and flank pain.  Musculoskeletal: Negative for joint swelling and arthralgias.  Skin: Negative for rash.  Neurological: Negative for dizziness and headaches.  Psychiatric/Behavioral: Negative for sleep disturbance and dysphoric mood.    Objective:  BP 117/62 mmHg  Pulse 69  Temp(Src) 97.1 F (36.2 C) (Oral)  Wt 223 lb 12.8 oz (101.515 kg)  SpO2 97%  BP Readings from Last 3 Encounters:  02/13/15 117/62  01/26/15 121/66  01/12/15 108/55    Wt Readings from Last 3 Encounters:  02/13/15 223 lb 12.8 oz (101.515 kg)  01/26/15 212 lb (96.163 kg)  01/12/15 221 lb (100.245 kg)     Physical Exam  Constitutional: He is oriented to person, place, and time. He appears well-developed and well-nourished. No distress.  HENT:  Head: Normocephalic and atraumatic.  Right Ear: External ear normal.  Left Ear: External ear normal.  Nose: Nose normal.  Mouth/Throat: Oropharynx is clear and moist.  Eyes: Conjunctivae and EOM are normal. Pupils are equal, round, and reactive to light.  Neck: Normal range of motion. Neck supple. No thyromegaly present.  Cardiovascular: Normal rate, regular rhythm and normal heart sounds.   No murmur heard. Pulmonary/Chest: Effort normal. No respiratory distress. He  has no wheezes. He has rales (few at bases).  Abdominal: Soft. Bowel sounds are normal. He exhibits no distension. There is no tenderness.  Musculoskeletal: He exhibits edema (2+ pretibial).  Lymphadenopathy:    He has no cervical adenopathy.  Neurological: He is alert and oriented to person, place, and time. He has normal reflexes.  Skin: Skin is warm and dry.  Psychiatric: He has a normal mood and affect. His behavior is normal. Judgment and thought content normal.     Lab Results  Component Value Date   WBC 4.3 01/12/2015   HGB 8.5* 12/30/2014   HCT 25.8* 01/12/2015   PLT 78* 12/30/2014   GLUCOSE 110* 01/12/2015   CHOL 214* 12/01/2014   TRIG 290* 12/01/2014   HDL 48 12/01/2014   LDLCALC 108* 12/01/2014   ALT 24 01/12/2015   AST 27 01/12/2015   NA 144 01/12/2015   K 3.9 01/12/2015   CL 98 01/12/2015   CREATININE 0.67* 01/12/2015   BUN 10 01/12/2015   CO2 33* 01/12/2015  PSA 1.0 02/15/2013   INR 1.1* 07/14/2014   HGBA1C 5.6 12/27/2014    Dg Chest 2 View  12/26/2014  CLINICAL DATA:  Shortness of breath. EXAM: CHEST  2 VIEW COMPARISON:  June 21, 2013. FINDINGS: The heart size and mediastinal contours are within normal limits. Both lungs are clear. No pneumothorax or pleural effusion is noted. Right internal jugular Port-A-Cath is noted with distal tip at expected position of cavoatrial junction. Postsurgical changes are seen involving left rib. IMPRESSION: No active cardiopulmonary disease. Electronically Signed   By: Marijo Conception, M.D.   On: 12/26/2014 21:52    Assessment & Plan:   Destine was seen today for wt gain.  Diagnoses and all orders for this visit:  Centrilobular emphysema ([place]) -     CMP14+EGFR  Chronic congestive heart failure, unspecified congestive heart failure type ([place]) -     CMP14+EGFR  Localized edema -     CMP14+EGFR -     Brain natriuretic peptide  Other orders -     ALPRAZolam (XANAX) 0.25 MG tablet; Take 0.5 tablets (0.125 mg total)  by mouth at bedtime.   I have changed Mr. Auvil's ALPRAZolam. I am also having him maintain his fentaNYL, potassium chloride, lactulose, Multiple Vitamins, Polyethyl Glycol-Propyl Glycol, folic acid, vitamin U-12, ONE TOUCH ULTRA TEST, promethazine, albuterol, Magnesium Chloride-Calcium, OXYGEN, budesonide-formoterol, SPIRIVA HANDIHALER, pantoprazole, eucerin, Naloxone HCl, traZODone, tapentadol, torsemide, cyclobenzaprine, polyethylene glycol powder, Linaclotide, and rOPINIRole.  Meds ordered this encounter  Medications  . rOPINIRole (REQUIP) 1 MG tablet    Sig: TAKE 1 TABLET BY MOUTH AT BEDTIME  . ALPRAZolam (XANAX) 0.25 MG tablet    Sig: Take 0.5 tablets (0.125 mg total) by mouth at bedtime.    Dispense:  15 tablet    Refill:  0     Follow-up: Return in about 3 weeks (around 03/03/2015) for CHF, COPD, diabetes.  Claretta Fraise, M.D.

## 2015-02-16 LAB — BRAIN NATRIURETIC PEPTIDE: BNP: 219.9 pg/mL — AB (ref 0.0–100.0)

## 2015-02-16 LAB — CMP14+EGFR
A/G RATIO: 1.9 (ref 1.1–2.5)
ALK PHOS: 76 IU/L (ref 39–117)
ALT: 18 IU/L (ref 0–44)
AST: 21 IU/L (ref 0–40)
Albumin: 3.6 g/dL (ref 3.5–5.5)
BUN/Creatinine Ratio: 18 (ref 9–20)
BUN: 12 mg/dL (ref 6–24)
CHLORIDE: 99 mmol/L (ref 97–106)
CO2: 34 mmol/L — ABNORMAL HIGH (ref 18–29)
Calcium: 9 mg/dL (ref 8.7–10.2)
Creatinine, Ser: 0.65 mg/dL — ABNORMAL LOW (ref 0.76–1.27)
GFR calc Af Amer: 125 mL/min/{1.73_m2} (ref >59)
GFR calc non Af Amer: 108 mL/min/{1.73_m2} (ref >59)
GLUCOSE: 96 mg/dL (ref 65–99)
Globulin, Total: 1.9 g/dL (ref 1.5–4.5)
POTASSIUM: 4.8 mmol/L (ref 3.5–5.2)
Sodium: 143 mmol/L (ref 136–144)
TOTAL PROTEIN: 5.5 g/dL — AB (ref 6.0–8.5)

## 2015-02-23 ENCOUNTER — Ambulatory Visit (INDEPENDENT_AMBULATORY_CARE_PROVIDER_SITE_OTHER): Payer: Medicare Other | Admitting: Pediatrics

## 2015-02-23 ENCOUNTER — Encounter: Payer: Self-pay | Admitting: Pediatrics

## 2015-02-23 ENCOUNTER — Ambulatory Visit (INDEPENDENT_AMBULATORY_CARE_PROVIDER_SITE_OTHER): Payer: Medicare Other

## 2015-02-23 VITALS — BP 132/72 | HR 84 | Temp 97.6°F | Ht 66.0 in | Wt 215.4 lb

## 2015-02-23 DIAGNOSIS — R05 Cough: Secondary | ICD-10-CM

## 2015-02-23 DIAGNOSIS — I509 Heart failure, unspecified: Secondary | ICD-10-CM

## 2015-02-23 DIAGNOSIS — R059 Cough, unspecified: Secondary | ICD-10-CM

## 2015-02-23 DIAGNOSIS — J432 Centrilobular emphysema: Secondary | ICD-10-CM | POA: Diagnosis not present

## 2015-02-23 DIAGNOSIS — J029 Acute pharyngitis, unspecified: Secondary | ICD-10-CM

## 2015-02-23 NOTE — Progress Notes (Signed)
Subjective:    Patient ID: Derek Shepherd, male    DOB: 10-28-1955, 59 y.o.   MRN: [id number]  CC: Cough and Chest Congestion   HPI: Derek Shepherd is a 59 y.o. male presenting for Cough and Chest Congestion  In hospital 3-4 weeks ago for Pneumonia. Was having home health On O2 at home, set up on 3L, now turned up to 4L Has had shaking in his hands for months, worse recently Was doing pulm rehab at home the first time he had pneumonia  Coughing more than usual at night over past few days Hasnt bounced back from this hospitalizatoin as fast as hehas in the past A little bit of sore throat Has been coughing up mucus for past 4-5 months Former smoker, not smoking now No chest pain, no fevers Normal appetite    Depression screen [facility] 2/9 02/23/2015 12/22/2014 12/18/2014 12/01/2014 09/09/2014  Decreased Interest 0 0 2 2 1   Down, Depressed, Hopeless 0 0 2 2 2   PHQ - 2 Score 0 0 4 4 3   Altered sleeping - - 2 3 3   Tired, decreased energy - - 3 3 3   Change in appetite - - 0 1 1  Feeling bad or failure about yourself  - - 1 1 2   Trouble concentrating - - 0 3 3  Moving slowly or fidgety/restless - - 3 1 0  Suicidal thoughts - - 1 0 0  PHQ-9 Score - - 14 16 15   Difficult doing work/chores - - - Somewhat difficult -     Relevant past medical, surgical, family and social history reviewed and updated as indicated. Interim medical history since our last visit reviewed. Allergies and medications reviewed and updated.    ROS: Per HPI unless specifically indicated above  History  Smoking status  . Former Smoker  . Types: Cigarettes  . Quit date: 07/13/1993  Smokeless tobacco  . Never Used    Past Medical History Patient Active Problem List   Diagnosis Date Noted  . COPD exacerbation ([place]) 12/27/2014  . CHF (congestive heart failure) ([place]) 07/14/2014  . 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 ALL (acute lymphoid leukemia) in remission ([place]) 03/20/2013  . COPD (chronic  obstructive pulmonary disease) ([place]) 03/20/2013  . DDD (degenerative disc disease), lumbar 03/20/2013  . Diabetes mellitus ([place]) 03/20/2013  . Chronic pain syndrome 03/20/2013  . Personal history of colonic  adenoma 01/22/2008        Objective:    BP 132/72 mmHg  Pulse 84  Temp(Src) 97.6 F (36.4 C) (Oral)  Ht 5\' 6"  (1.676 m)  Wt 215 lb 6.4 oz (97.705 kg)  BMI 34.78 kg/m2  SpO2 95%  Wt Readings from Last 3 Encounters:  02/23/15 215 lb 6.4 oz (97.705 kg)  02/13/15 223 lb 12.8 oz (101.515 kg)  01/26/15 212 lb (96.163 kg)     Gen: NAD, alert, cooperative with exam, NCAT EYES: EOMI, no scleral injection or icterus ENT:  TMs pearly gray b/l, OP blue from jolly rancher, not able to assess erythema, no lesions LYMPH: no cervical LAD, b/l submandibular lymph nodes CV: NRRR, normal S1/S2, no murmur, distal pulses 2+ b/l Resp: tubular breath sounds R side upper lobes more than lower, moving air fair, decreased breath sounds overall, no crackles, prolonged exp phase but no wheezes. comfortable WOB Abd: +BS, soft, NTND. no guarding or organomegaly Ext: No edema, warm Neuro: Alert and oriented, strength equal b/l UE and LE, finger nose finger normal, slightly  better R side than L side, pt R hand dominant, minimal fine tremor present at times when he holds his hands in front of him, not all the time MSK: normal muscle bulk     Assessment & Plan:    Sirwilliam was seen today for cough, tremulousness. Recently has been on increased torsemide for CHF and 9 lb weight gain in one week. Has been on 150mg  of torsemide for the last 10 days.   Diagnoses and all orders for this visit:  Chronic congestive heart failure, unspecified congestive heart failure type ([place]) Diastolic heart failure, BP well controlled today. On torsemide, beta-blocker. Will recheck labs today with increase in diuretic. F/u as scheduled with oncologist tomorrow, PCP next week.  Centrilobular emphysema (HCC) On chronic oxygen  3L at home, has turned up to 4L past few days. O2 sats have not changed recently at home. Coughing more for the past 4-5 months. PFTs at [facility] 3 months 36% FVC of predicted, FEV1 33% of predicted, unchanged per pt from what he has been told previously. Using albuterol daily, also on symbicort.   Cough Will get CXR, also with increased O2 at home slightly increased.   Sore throat Ongoing past 4-5 days. Could be viral. Pt also with GVHD of lungs, eyes. Has not had a endoscopy though with other medical conditions may not tolerate. Cont to monitor, has f/u with oncology tomorrow, PCP next week.   Follow up plan: As scheduled  Assunta Found, MD [facility] 02/23/2015, 1:19 PM

## 2015-02-25 ENCOUNTER — Other Ambulatory Visit: Payer: Self-pay | Admitting: Family Medicine

## 2015-03-03 ENCOUNTER — Encounter: Payer: Self-pay | Admitting: Family Medicine

## 2015-03-03 ENCOUNTER — Ambulatory Visit (INDEPENDENT_AMBULATORY_CARE_PROVIDER_SITE_OTHER): Payer: Medicare Other | Admitting: Family Medicine

## 2015-03-03 VITALS — BP 121/76 | HR 81 | Temp 97.0°F | Ht 66.0 in | Wt 214.4 lb

## 2015-03-03 DIAGNOSIS — J431 Panlobular emphysema: Secondary | ICD-10-CM

## 2015-03-03 DIAGNOSIS — C9101 Acute lymphoblastic leukemia, in remission: Secondary | ICD-10-CM | POA: Diagnosis not present

## 2015-03-03 NOTE — Progress Notes (Signed)
Subjective:  Patient ID: Derek Shepherd, male    DOB: May 29, 1955  Age: 59 y.o. MRN: [id number]  CC: Follow-up   HPI Derek Shepherd presents for jittery in the morning. Increasing in frequency, now daily, and duration, sometimes a few hours, since onset several weeks ago. Tired out all the time.  Not edematous. Dyspnea stable. Using oxygen for adequate relief. Pain mgmt regulating opiates.  Recently has had low potassium. In for recheck. Was better after increasing dose to 3 bid  History Derek Shepherd has a past medical history of Leukemia-lymphoma, T-cell, acute, HTLV-I-associated ([place]); COPD (chronic obstructive pulmonary disease) ([place]); DVT (deep venous thrombosis) ([place]); Personal history of colonic  adenoma (01/22/2008); Anemia; Anxiety; Arthritis; Depression; Gallstones; Bowel obstruction ([place]); Adenomatous colon polyp; Diverticulosis; CHF (congestive heart failure) ([place]); and GERD (gastroesophageal reflux disease).   He has past surgical history that includes Cholecystectomy; Lung surgery (Left); Exploratory laparotomy; Hernia repair; and Colonoscopy w/ biopsies.   His family history includes Clotting disorder in his mother; Diabetes in his brother, father, maternal aunt, and sister; Heart attack in his father and mother; Prostate cancer in his brother.He reports that he quit smoking about 21 years ago. His smoking use included Cigarettes. He has never used smokeless tobacco. He reports that he does not drink alcohol or use illicit drugs.  Outpatient Prescriptions Prior to Visit  Medication Sig Dispense Refill  . albuterol (PROVENTIL) (2.5 MG/3ML) 0.083% nebulizer solution Take 3 mLs (2.5 mg total) by nebulization every 6 (six) hours as needed for wheezing or shortness of breath. 75 mL 4  . ALPRAZolam (XANAX) 0.25 MG tablet Take 0.5 tablets (0.125 mg total) by mouth at bedtime. 15 tablet 0  . budesonide-formoterol (SYMBICORT) 160-4.5 MCG/ACT inhaler Inhale 2 puffs into the lungs every  morning. 1 Inhaler 2  . cyclobenzaprine (FLEXERIL) 10 MG tablet Take 1 tablet (10 mg total) by mouth 3 (three) times daily as needed. For Headache 30 tablet 2  . fentaNYL (DURAGESIC - DOSED MCG/HR) 25 MCG/HR patch Place 25 mcg onto the skin every other day.     . folic acid (FOLVITE) 1 MG tablet Take 1 mg by mouth daily.     Marland Kitchen lactulose (CHRONULAC) 10 GM/15ML solution Take 10 g by mouth daily as needed for mild constipation.     . Linaclotide (LINZESS) 290 MCG CAPS capsule Take 1 capsule (290 mcg total) by mouth daily. 30 capsule 11  . Magnesium Chloride-Calcium 64-106 MG TBEC Take 2 tablets by mouth daily.     . Multiple Vitamins tablet Take 1 tablet by mouth daily.     . ONE TOUCH ULTRA TEST test strip USE ONE STRIP TO CHECK GLUCOSE ONCE DAILY IN THE MORNING FOR  FASTING  BLOOD  SUGAR 100 each 0  . OXYGEN Inhale into the lungs. Continuous 5 LPM when up and about 3 LPM constant and at night    . Polyethyl Glycol-Propyl Glycol (SYSTANE PRESERVATIVE FREE) 0.4-0.3 % SOLN Place 1 drop into both eyes daily as needed. For dry eyes    . polyethylene glycol powder (GLYCOLAX/MIRALAX) powder Take 17 g by mouth 2 (two) times daily as needed. 3350 g 1  . promethazine (PHENERGAN) 25 MG tablet TAKE ONE TABLET BY MOUTH 4 TIMES DAILY AS NEEDED FOR NAUSEA 30 tablet 0  . rOPINIRole (REQUIP) 1 MG tablet TAKE 1 TABLET BY MOUTH AT BEDTIME    . Skin Protectants, Misc. (EUCERIN) cream Apply 1 application topically daily.    Marland Kitchen SPIRIVA HANDIHALER 18 MCG inhalation  capsule Place 1 capsule (18 mcg total) into inhaler and inhale daily. 30 capsule 5  . [START ON 03/19/2015] tapentadol (NUCYNTA) 50 MG TABS tablet Take 50 mg by mouth.    . torsemide (DEMADEX) 100 MG tablet TAKE ONE TABLET BY MOUTH ONCE DAILY 30 tablet 5  . traZODone (DESYREL) 150 MG tablet 1 or 2 at bedtime for sleep 60 tablet 2  . vitamin B-12 (CYANOCOBALAMIN) 1000 MCG tablet Take 1,000 mcg by mouth.    . pantoprazole (PROTONIX) 40 MG tablet TAKE ONE TABLET  BY MOUTH ONCE DAILY IN THE MORNING 30 tablet 0  . potassium chloride (MICRO-K) 10 MEQ CR capsule 30 mEq 2 (two) times daily. Takes 2 capsules daily     No facility-administered medications prior to visit.    ROS Review of Systems  Constitutional: Negative for fever, chills, diaphoresis and unexpected weight change.  HENT: Negative for congestion, hearing loss, rhinorrhea and sore throat.   Eyes: Negative for visual disturbance.  Respiratory: Negative for cough and shortness of breath.   Cardiovascular: Negative for chest pain.  Gastrointestinal: Negative for abdominal pain, diarrhea and constipation.  Genitourinary: Negative for dysuria and flank pain.  Musculoskeletal: Negative for joint swelling and arthralgias.  Skin: Negative for rash.  Neurological: Positive for tremors and weakness. Negative for dizziness, seizures, speech difficulty, numbness and headaches.  Psychiatric/Behavioral: Positive for decreased concentration. Negative for sleep disturbance and dysphoric mood. The patient is nervous/anxious.     Objective:  BP 121/76 mmHg  Pulse 81  Temp(Src) 97 F (36.1 C) (Oral)  Ht 5' 6"  (1.676 m)  Wt 214 lb 6.4 oz (97.251 kg)  BMI 34.62 kg/m2  SpO2 95%  BP Readings from Last 3 Encounters:  03/03/15 121/76  02/23/15 132/72  02/13/15 117/62    Wt Readings from Last 3 Encounters:  03/03/15 214 lb 6.4 oz (97.251 kg)  02/23/15 215 lb 6.4 oz (97.705 kg)  02/13/15 223 lb 12.8 oz (101.515 kg)     Physical Exam  Constitutional: He is oriented to person, place, and time. He appears well-developed and well-nourished. No distress.  HENT:  Head: Normocephalic and atraumatic.  Right Ear: External ear normal.  Left Ear: External ear normal.  Nose: Nose normal.  Mouth/Throat: Oropharynx is clear and moist.  Eyes: Conjunctivae and EOM are normal. Pupils are equal, round, and reactive to light.  Neck: Normal range of motion. Neck supple. No thyromegaly present.  Cardiovascular:  Normal rate, regular rhythm and normal heart sounds.   No murmur heard. Pulmonary/Chest: Effort normal and breath sounds normal. No respiratory distress. He has no wheezes. He has no rales.  Abdominal: Soft. Bowel sounds are normal. He exhibits no distension. There is no tenderness.  Lymphadenopathy:    He has no cervical adenopathy.  Neurological: He is alert and oriented to person, place, and time. He has normal reflexes.  Skin: Skin is warm and dry.  Psychiatric: He has a normal mood and affect. His behavior is normal. Judgment and thought content normal.     Lab Results  Component Value Date   WBC 4.3 01/12/2015   HGB 8.5* 12/30/2014   HCT 25.8* 01/12/2015   PLT 78* 12/30/2014   GLUCOSE 179* 03/03/2015   CHOL 214* 12/01/2014   TRIG 290* 12/01/2014   HDL 48 12/01/2014   LDLCALC 108* 12/01/2014   ALT CANCELED 02/13/2015   ALT 18 02/13/2015   AST CANCELED 02/13/2015   AST 21 02/13/2015   NA 145* 03/03/2015   K 3.7 03/03/2015  CL 92* 03/03/2015   CREATININE 0.87 03/03/2015   BUN 13 03/03/2015   CO2 38* 03/03/2015   PSA 1.0 02/15/2013   INR 1.1* 07/14/2014   HGBA1C 5.6 12/27/2014    Dg Chest 2 View  12/26/2014  CLINICAL DATA:  Shortness of breath. EXAM: CHEST  2 VIEW COMPARISON:  June 21, 2013. FINDINGS: The heart size and mediastinal contours are within normal limits. Both lungs are clear. No pneumothorax or pleural effusion is noted. Right internal jugular Port-A-Cath is noted with distal tip at expected position of cavoatrial junction. Postsurgical changes are seen involving left rib. IMPRESSION: No active cardiopulmonary disease. Electronically Signed   By: Marijo Conception, M.D.   On: 12/26/2014 21:52    Assessment & Plan:   Sebastyan was seen today for follow-up.  Diagnoses and all orders for this visit:  Panlobular emphysema (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 Will defer to pulmonary & Pain mgmt.  Potassium dose to be adjusted based on lab result. Neurology may need to evaluate as well based on outcome of pulmonary eval. We also reviewed the inherent dangers of opiate use in COPD as my rationale for preferring he not increase the xanax to combat the jitteriness.  Follow-up: Return in about 6 weeks (around 04/14/2015).  Claretta Fraise, M.D.

## 2015-03-04 ENCOUNTER — Telehealth: Payer: Self-pay | Admitting: Family Medicine

## 2015-03-04 LAB — BMP8+EGFR
BUN / CREAT RATIO: 15 (ref 9–20)
BUN: 13 mg/dL (ref 6–24)
CO2: 38 mmol/L — AB (ref 18–29)
Calcium: 9.7 mg/dL (ref 8.7–10.2)
Chloride: 92 mmol/L — ABNORMAL LOW (ref 96–106)
Creatinine, Ser: 0.87 mg/dL (ref 0.76–1.27)
GFR calc non Af Amer: 96 mL/min/{1.73_m2} (ref >59)
GFR, EST AFRICAN AMERICAN: 111 mL/min/{1.73_m2} (ref >59)
Glucose: 179 mg/dL — ABNORMAL HIGH (ref 65–99)
POTASSIUM: 3.7 mmol/L (ref 3.5–5.2)
SODIUM: 145 mmol/L — AB (ref 134–144)

## 2015-03-04 MED ORDER — POTASSIUM CHLORIDE ER 10 MEQ PO CPCR: 30.0000 meq | ORAL_CAPSULE | Freq: Two times a day (BID) | ORAL | Status: DC

## 2015-03-04 NOTE — Telephone Encounter (Signed)
Spoke with pt's wife Shaking is getting worse Informed to call neurologist Also needed refill on K+ Refill sent in  Pt's wife verbalizes understanding

## 2015-03-04 NOTE — Telephone Encounter (Signed)
FYI for Dr. Livia Snellen , patient has appointment with neurologist January 3.

## 2015-03-05 ENCOUNTER — Other Ambulatory Visit: Payer: Self-pay | Admitting: Physician Assistant

## 2015-03-10 ENCOUNTER — Encounter: Payer: Self-pay | Admitting: Neurology

## 2015-03-10 ENCOUNTER — Ambulatory Visit (INDEPENDENT_AMBULATORY_CARE_PROVIDER_SITE_OTHER): Payer: Medicare HMO | Admitting: Neurology

## 2015-03-10 VITALS — BP 130/70 | HR 85 | Ht 66.0 in | Wt 217.4 lb

## 2015-03-10 DIAGNOSIS — R569 Unspecified convulsions: Secondary | ICD-10-CM

## 2015-03-10 DIAGNOSIS — R259 Unspecified abnormal involuntary movements: Secondary | ICD-10-CM | POA: Diagnosis not present

## 2015-03-10 NOTE — Patient Instructions (Signed)
1. Keep a calendar of when the tremors occur and when he takes medications 2. Follow-up with Pain management and discuss potentially lowering pain medication 3. Follow-up in 3 months

## 2015-03-10 NOTE — Progress Notes (Signed)
NEUROLOGY FOLLOW UP OFFICE NOTE  MALAKII HARL [id number]  HISTORY OF PRESENT ILLNESS: I had the pleasure of seeing Derek Shepherd in follow-up in the neurology clinic on 03/10/2015.  The patient was last seen 8 months ago for new onset seizure on 01/20/14. He is again accompanied by his wife and friend who help supplement the history today. They deny any further seizures or seizure-like symptoms since November 2015. He is not taking any seizure medication. He presents today due to new symptoms of tremors. His wife reports that he has always had mild bilateral hand tremors noticeable when he uses a spoon or writes, but for the past 2 weeks, the shaking has been pretty significant. Their friend who has witnessed it reports that they look more like spasms than tremors, with a jerking kind of movement. He fell 2 weeks ago, he noticed that his arms were jerking as he was holding down his pants to urinate. He stood up and turned to the sink, then felt his legs tremble. His knees hit the cabinet and he fell, no injuries. He denies any numbness or tingling in his extremities. He has occasional leg cramps. He has neck and back pain, and has been on the Fentanyl patch for left foot pain.   We reviewed his medications, no changes with his inhalers, which he uses every morning. Torsemide was increased 3 weeks ago. He was started on Trazodone 4 weeks ago. He takes Xanax every night. He took an additional dose after having the tremors one time, and his wife noticed this helped. His wife reports that he has occasional hallucinations during the daytime, saying their son's name or talking about stuff like he is at work. He talks in his sleep. He reports a sleep study done in the past which did not show sleep apnea. He has occasional dizziness, no headaches.   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 He was in his usual state of health and recalls being stressed that day, went to bed, then woke up the next morning being told he had a seizure. His wife woke up at 11:30pm to the bed shaking, all his extremities were extended and stiff with shaking lasting 3 minutes. He was unresponsive after, then said "what" to his wife, then went back to sleep. There was no tongue bite, urinary incontinence. He denies any recent illnesses, alcohol intake, or sleep deprivation. He went to his PCP 2 days later, CBC showed WBC 3.8, Hct 30.6, platelets 122; CMP unremarkable.   They deny any episodes of staring/unresponsiveness, gaps in time, olfactory/gustatory hallucinations, focal numbness/tingling/weakness. His wife reports that he frequently "messes with his clothes or bedsheets with both hands, no dystonic posturing or unresponsiveness. His hands would swell up at night and he would be unable to flex his fingers. They report visual hallucinations more over the past few months, for instance he would be watching TV and see a little boy in the room. He would think he and his brother were working on something. His wife reports he would get confused and talk nonsense. They have noted occasional body jerking in wakefulness and sleep. He has occasional tingling in both hands. He has a history of left foot numbness since a gluteal hematoma in 2011. He has tinnitus in both ears. He has  a history of left Bell's palsy in the 1980s with residual synkinesis.   He had a normal birth and early development. There is no history of febrile convulsions, CNS infections such as meningitis/encephalitis, significant traumatic brain injury, neurosurgical procedures, or family history of seizures.  Diagnostic Data: I personally reviewed MRI brain with and without contrast which did not show any acute changes, no  abnormal signal or enhancement seen. His routine EEG was normal overall, however the left temporal derivations were obscured by artifact.   PAST MEDICAL HISTORY: Past Medical History  Diagnosis Date  . Leukemia-lymphoma, T-cell, acute, HTLV-I-associated ([place])   . COPD (chronic obstructive pulmonary disease) ([place])   . DVT (deep venous thrombosis) ([place])   . Personal history of colonic  adenoma 01/22/2008  . Anemia   . Anxiety   . Arthritis   . Depression   . Gallstones   . Bowel obstruction ([facility])     constipation  . Adenomatous colon polyp     tubular  . Diverticulosis   . CHF (congestive heart failure) ([place])   . GERD (gastroesophageal reflux disease)     MEDICATIONS: Current Outpatient Prescriptions on File Prior to Visit  Medication Sig Dispense Refill  . albuterol (PROVENTIL) (2.5 MG/3ML) 0.083% nebulizer solution Take 3 mLs (2.5 mg total) by nebulization every 6 (six) hours as needed for wheezing or shortness of breath. 75 mL 4  . ALPRAZolam (XANAX) 0.25 MG tablet Take 0.5 tablets (0.125 mg total) by mouth at bedtime. 15 tablet 0  . budesonide-formoterol (SYMBICORT) 160-4.5 MCG/ACT inhaler Inhale 2 puffs into the lungs every morning. 1 Inhaler 2  . cyclobenzaprine (FLEXERIL) 10 MG tablet Take 1 tablet (10 mg total) by mouth 3 (three) times daily as needed. For Headache 30 tablet 2  . fentaNYL (DURAGESIC - DOSED MCG/HR) 25 MCG/HR patch Place 25 mcg onto the skin every other day.     . folic acid (FOLVITE) 1 MG tablet Take 1 mg by mouth daily.     Marland Kitchen lactulose (CHRONULAC) 10 GM/15ML solution Take 10 g by mouth daily as needed for mild constipation.     . Linaclotide (LINZESS) 290 MCG CAPS capsule Take 1 capsule (290 mcg total) by mouth daily. 30 capsule 11  . Magnesium Chloride-Calcium 64-106 MG TBEC Take 2 tablets by mouth daily.     . Multiple Vitamins tablet Take 1 tablet by mouth daily.     . ONE TOUCH ULTRA TEST test strip USE ONE STRIP TO CHECK GLUCOSE ONCE DAILY IN THE  MORNING FOR  FASTING  BLOOD  SUGAR 100 each 0  . OXYGEN Inhale into the lungs. Continuous 5 LPM when up and about 3 LPM constant and at night    . pantoprazole (PROTONIX) 40 MG tablet TAKE ONE TABLET BY MOUTH ONCE DAILY IN THE MORNING 30 tablet 0  . Polyethyl Glycol-Propyl Glycol (SYSTANE PRESERVATIVE FREE) 0.4-0.3 % SOLN Place 1 drop into both eyes daily as needed. For dry eyes    . polyethylene glycol powder (GLYCOLAX/MIRALAX) powder Take 17 g by mouth 2 (two) times daily as needed. 3350 g 1  . potassium chloride (MICRO-K) 10 MEQ CR capsule Take 3 capsules (30 mEq total) by mouth 2 (two) times daily. Takes 2 capsules daily 180 capsule 2  . promethazine (PHENERGAN) 25 MG tablet TAKE ONE TABLET BY MOUTH 4 TIMES DAILY AS NEEDED FOR NAUSEA 30 tablet 0  . rOPINIRole (REQUIP) 1 MG tablet TAKE 1 TABLET BY MOUTH AT BEDTIME    . Skin  Protectants, Misc. (EUCERIN) cream Apply 1 application topically daily.    Marland Kitchen SPIRIVA HANDIHALER 18 MCG inhalation capsule Place 1 capsule (18 mcg total) into inhaler and inhale daily. 30 capsule 5  . [START ON 03/19/2015] tapentadol (NUCYNTA) 50 MG TABS tablet Take 50 mg by mouth.    . torsemide (DEMADEX) 100 MG tablet TAKE ONE TABLET BY MOUTH ONCE DAILY 30 tablet 5  . traZODone (DESYREL) 150 MG tablet 1 or 2 at bedtime for sleep 60 tablet 2  . vitamin B-12 (CYANOCOBALAMIN) 1000 MCG tablet Take 1,000 mcg by mouth.     No current facility-administered medications on file prior to visit.    ALLERGIES: Allergies  Allergen Reactions  . Nsaids Shortness Of Breath    FAMILY HISTORY: Family History  Problem Relation Age of Onset  . Prostate cancer Brother   . Clotting disorder Mother   . Diabetes Father   . Diabetes Brother     x 3  . Diabetes Sister     x 3  . Diabetes Maternal Aunt   . Heart attack Mother   . Heart attack Father     SOCIAL HISTORY: Social History   Social History  . Marital Status: Married    Spouse Name: N/A  . Number of Children: 2  .  Years of Education: N/A   Occupational History  . disabled    Social History Main Topics  . Smoking status: Former Smoker    Types: Cigarettes    Quit date: 07/13/1993  . Smokeless tobacco: Never Used  . Alcohol Use: No  . Drug Use: No  . Sexual Activity: Not on file   Other Topics Concern  . Not on file   Social History Narrative    REVIEW OF SYSTEMS: Constitutional: No fevers, chills, or sweats, no generalized fatigue, change in appetite Eyes: No visual changes, double vision, eye pain Ear, nose and throat: No hearing loss, ear pain, nasal congestion, sore throat Cardiovascular: No chest pain, palpitations Respiratory:  No shortness of breath at rest or with exertion, wheezes GastrointestinaI: No nausea, vomiting, diarrhea, abdominal pain, fecal incontinence Genitourinary:  No dysuria, urinary retention or frequency Musculoskeletal:  + neck pain, back pain Integumentary: No rash, pruritus, skin lesions Neurological: as above Psychiatric: No depression, insomnia, +anxiety Endocrine: No palpitations, fatigue, diaphoresis, mood swings, change in appetite, change in weight, increased thirst Hematologic/Lymphatic:  No anemia, purpura, petechiae. Allergic/Immunologic: no itchy/runny eyes, nasal congestion, recent allergic reactions, rashes  PHYSICAL EXAM: Filed Vitals:   03/10/15 0955  BP: 130/70  Pulse: 85   General: No acute distress, on O2 nasal cannula Head: Normocephalic/atraumatic Eyes: Fundoscopic exam shows bilateral sharp discs, no vessel changes, exudates, or hemorrhages Neck: supple, no paraspinal tenderness, full range of motion Back: No paraspinal tenderness Heart: regular rate and rhythm Lungs: Clear to auscultation bilaterally. Vascular: No carotid bruits. Skin/Extremities: No rash, no edema Neurological Exam: Mental status: alert and oriented to person, place, time. no dysarthria or aphasia, Fund of knowledge is appropriate. Recent and remote memory  are intact. Attention and concentration are normal. Able to name objects and repeat phrases. Cranial nerves: CN I: not tested CN II: pupils equal, round and reactive to light, visual fields intact, fundi unremarkable. CN III, IV, VI: full range of motion, no nystagmus, no ptosis CN V: facial sensation intact CN VII: upper and lower face symmetric, +synkinesis on left cheek and chin (similar to prior) CN VIII: hearing intact to finger rub CN IX, X: gag intact, uvula midline  CN XI: sternocleidomastoid and trapezius muscles intact CN XII: tongue midline Bulk & Tone: normal, no cogwheeling, no fasciculations. Motor: 5/5 throughout with no pronator drift. Sensation: intact to light touch. No extinction to double simultaneous stimulation. Romberg test negative Deep Tendon Reflexes: +2 throughout except for absent ankle jerks, no ankle clonus, negative Hoffman sign Plantar responses: downgoing bilaterally Cerebellar: no incoordination on finger to nose testing Gait: slow and cautious, no ataxia, unable to tandem walk (similar to prior), good arm swing Tremor: no tremors noted or elicited in the office today  IMPRESSION: This is a 60 yo RH man with a history of ALL s/p stem cell transplant, chemotherapy, radiation, PE/DVT, GVD, with new onset seizure last 01/20/14. MRI brain with and without contrast and routine EEG normal. No further similar symptoms since November 2015. No indication to start seizure medication at this point. He presents today with a new symptom of worsening tremors. His friend describes them more as muscle spasms/jerkiness rather than tremors. None elicited or seen in the office today, no Parkinsonian signs seen. The etiology of these abnormal movements is unclear, it may be drug-induced, his wife will keep a calendar and see if there is any correlation to time of medication intake. They will discussed pain medications with his Pain Specialist, we discussed minimizing narcotics  as much as possible. We also discussed how anxiety can also cause similar symptoms, his wife reports they went away after taking an additional dose of Xanax. Continue to monitor clinically for now, if symptoms worsen he knows to call our office. He will follow-up in 3 months.   Thank you for allowing me to participate in his care.  Please do not hesitate to call for any questions or concerns.  The duration of this appointment visit was 25 minutes of face-to-face time with the patient.  Greater than 50% of this time was spent in counseling, explanation of diagnosis, planning of further management, and coordination of care.   Ellouise Newer, M.D.   CC: Dr. Claretta Fraise

## 2015-03-13 ENCOUNTER — Telehealth: Payer: Self-pay | Admitting: Family Medicine

## 2015-03-13 NOTE — Telephone Encounter (Signed)
That is not a known side effect of linzess

## 2015-03-13 NOTE — Telephone Encounter (Signed)
Spoke with pt's wife regarding tremors  She wants to know if Linzess could cause tremors Please advise

## 2015-03-25 ENCOUNTER — Other Ambulatory Visit: Payer: Self-pay | Admitting: Family Medicine

## 2015-03-25 MED ORDER — TORSEMIDE 100 MG PO TABS: 150.0000 mg | ORAL_TABLET | Freq: Every day | ORAL | Status: DC

## 2015-03-27 ENCOUNTER — Telehealth: Payer: Self-pay | Admitting: Family Medicine

## 2015-03-27 NOTE — Telephone Encounter (Signed)
Derek Shepherd, pt wants to see you to review medications Please advise how long appt should be thanks

## 2015-03-27 NOTE — Telephone Encounter (Signed)
Sounds like a great idea. Please schedule.

## 2015-03-27 NOTE — Telephone Encounter (Signed)
I would love to review patient's medications with him and see if there is something we can discontinue - make appt for 04/08/15

## 2015-03-30 NOTE — Telephone Encounter (Signed)
Pt given appt 2/3 at 9:30 because pt had appt with lung doctor at [facility] on the 1st. Pt and wife aware to bring all of his medications including OTC meds to appt.

## 2015-03-31 ENCOUNTER — Other Ambulatory Visit: Payer: Self-pay | Admitting: Internal Medicine

## 2015-04-08 ENCOUNTER — Ambulatory Visit: Payer: Self-pay | Admitting: Pharmacist

## 2015-04-10 ENCOUNTER — Encounter: Payer: Self-pay | Admitting: Pharmacist

## 2015-04-10 ENCOUNTER — Ambulatory Visit (INDEPENDENT_AMBULATORY_CARE_PROVIDER_SITE_OTHER): Payer: Medicare HMO | Admitting: Pharmacist

## 2015-04-10 VITALS — BP 132/71 | HR 78 | Ht 66.0 in | Wt 219.0 lb

## 2015-04-10 DIAGNOSIS — J441 Chronic obstructive pulmonary disease with (acute) exacerbation: Secondary | ICD-10-CM

## 2015-04-10 DIAGNOSIS — T402X5A Adverse effect of other opioids, initial encounter: Secondary | ICD-10-CM

## 2015-04-10 DIAGNOSIS — Z79899 Other long term (current) drug therapy: Secondary | ICD-10-CM

## 2015-04-10 DIAGNOSIS — K5903 Drug induced constipation: Secondary | ICD-10-CM | POA: Diagnosis not present

## 2015-04-10 DIAGNOSIS — R7303 Prediabetes: Secondary | ICD-10-CM

## 2015-04-10 MED ORDER — LINACLOTIDE 145 MCG PO CAPS: 145.0000 ug | ORAL_CAPSULE | Freq: Every day | ORAL | Status: DC

## 2015-04-10 NOTE — Progress Notes (Signed)
Patient ID: Derek Shepherd, male   DOB: [birth year], 60 y.o.   MRN: [id number]  CC:  Medication Review  HPI:  Patient and wife wanted to meet to review medications.  Derek Shepherd is mostly concern about his breathing.  He states that he use to walk between 30 to 60 minutes on the treadmill regularly but since October 2016 after he had pneumonia he can only exercise about 15 minutes.   He is also on several medications for constipation which is not uncommon given that he is on chronic pain medication - duragesic / fentanyl 79mcg patches q24h and Nucyenta.  He does complain though that when he has to take Linzess it "works too well" and he has diarrhea for about 24 hours after taking. Other medications for constipation include - miralax (does not use much) and  Chronulac / lactulose (uses about 2-3 times per month)  Patient's wife if with him today and she brings medication list.  Their list and our list of medications is reviewed and both are updated.   Patient also tried to refill Spiriva this week and was told by pharmacy that it was too early but he is out.    Current Outpatient Prescriptions on File Prior to Visit  Medication Sig Dispense Refill  . albuterol (PROVENTIL) (2.5 MG/3ML) 0.083% nebulizer solution Take 3 mLs (2.5 mg total) by nebulization every 6 (six) hours as needed for wheezing or shortness of breath. 75 mL 4  . ALPRAZolam (XANAX) 0.25 MG tablet Take 0.5 tablets (0.125 mg total) by mouth at bedtime. (Patient taking differently: Take 0.25 mg by mouth at bedtime. ) 15 tablet 0  . budesonide-formoterol (SYMBICORT) 160-4.5 MCG/ACT inhaler Inhale 2 puffs into the lungs every morning. 1 Inhaler 2  . cyclobenzaprine (FLEXERIL) 10 MG tablet Take 1 tablet (10 mg total) by mouth 3 (three) times daily as needed. For Headache 30 tablet 2  . fentaNYL (DURAGESIC - DOSED MCG/HR) 25 MCG/HR patch Place 25 mcg onto the skin every other day.     . folic acid (FOLVITE) 1 MG tablet Take 1 mg by mouth  daily.     Marland Kitchen lactulose (CHRONULAC) 10 GM/15ML solution Take 10 g by mouth daily as needed for mild constipation.     . Magnesium Chloride-Calcium 64-106 MG TBEC Take 2 tablets by mouth daily.     . Multiple Vitamins tablet Take 1 tablet by mouth daily.     . ONE TOUCH ULTRA TEST test strip USE ONE STRIP TO CHECK GLUCOSE ONCE DAILY IN THE MORNING FOR  FASTING  BLOOD  SUGAR 100 each 0  . OXYGEN Inhale into the lungs. Continuous 5 LPM when up and about 3 LPM constant and at night    . pantoprazole (PROTONIX) 40 MG tablet TAKE ONE TABLET BY MOUTH ONCE DAILY IN THE MORNING 30 tablet 2  . Polyethyl Glycol-Propyl Glycol (SYSTANE PRESERVATIVE FREE) 0.4-0.3 % SOLN Place 1 drop into both eyes daily as needed. For dry eyes    . potassium chloride (MICRO-K) 10 MEQ CR capsule Take 3 capsules (30 mEq total) by mouth 2 (two) times daily. Takes 2 capsules daily (Patient taking differently: Take 30 mEq by mouth 2 (two) times daily. ) 180 capsule 2  . rOPINIRole (REQUIP) 1 MG tablet TAKE 1 TABLET BY MOUTH AT BEDTIME    . SPIRIVA HANDIHALER 18 MCG inhalation capsule Place 1 capsule (18 mcg total) into inhaler and inhale daily. 30 capsule 5  . tapentadol (NUCYNTA) 50 MG TABS tablet  Take 50 mg by mouth.    . torsemide (DEMADEX) 100 MG tablet Take 1.5 tablets (150 mg total) by mouth daily. 135 tablet 3  . traZODone (DESYREL) 150 MG tablet 1 or 2 at bedtime for sleep 60 tablet 2  . vitamin B-12 (CYANOCOBALAMIN) 1000 MCG tablet Take 1,000 mcg by mouth.    . polyethylene glycol powder (GLYCOLAX/MIRALAX) powder Take 17 g by mouth 2 (two) times daily as needed. (Patient not taking: Reported on 04/10/2015) 3350 g 1  . promethazine (PHENERGAN) 25 MG tablet TAKE ONE TABLET BY MOUTH 4 TIMES DAILY AS NEEDED FOR NAUSEA (Patient not taking: Reported on 04/10/2015) 30 tablet 0  . Skin Protectants, Misc. (EUCERIN) cream Apply 1 application topically daily.     No current facility-administered medications on file prior to visit.    Past Medical History  Diagnosis Date  . Leukemia-lymphoma, T-cell, acute, HTLV-I-associated ([place])   . COPD (chronic obstructive pulmonary disease) ([place])   . DVT (deep venous thrombosis) ([place])   . Personal history of colonic  adenoma 01/22/2008  . Anemia   . Anxiety   . Arthritis   . Depression   . Gallstones   . Bowel obstruction ([facility])     constipation  . Adenomatous colon polyp     tubular  . Diverticulosis   . CHF (congestive heart failure) ([place])   . GERD (gastroesophageal reflux disease)   . Graft-versus-host disease as complication of bone marrow transplantation ([place])    Objective:  Filed Vitals:   04/10/15 1011  BP: 132/71  Pulse: 78   Filed Weights   04/10/15 1011  Weight: 219 lb (99.338 kg)   Body mass index is 35.36 kg/(m^2).   Assessment;  Medication Management - medication list in need of updating as several medications were missing that are prescribed at [facility].  Constipation related to chronic pain medication with diarrhea when using Linzess COPD vs decompensation  Plan:  1.  Decrease Linzess from 290mg  to 145mg  - patient was given #12 samples to try.  I also suggested he could use Senokot-S and gave him protocol to use as needed for constipation.  2. Gave #28 day supple of Spiriva Respimet and demonstrated correct use.  This should last him until he is able to fill Rx from his pharmacy. 3.  Gave #14 doses of Breo 100/25mg  inhale 1 puff once daily to try in place of Symbicort to see if it give better results.  Patient is advised to follow up with pulmonologist.  4.  Patient is encouraged to continue with plan to decrease alprazolam (currently still taking 1 tablet daily.  Explained that due to COPD and that he is taking pain medication there is increase risk of pulmonary suppression and unintentional overdose.  Patient to follow up with me if he had further questions or concerns.  Derek Shepherd, PharmD, CPP

## 2015-04-13 ENCOUNTER — Other Ambulatory Visit: Payer: Self-pay | Admitting: Family Medicine

## 2015-04-14 ENCOUNTER — Other Ambulatory Visit: Payer: Self-pay | Admitting: Family Medicine

## 2015-04-15 NOTE — Telephone Encounter (Signed)
Last seen 03/03/15  Dr Livia Snellen

## 2015-04-16 ENCOUNTER — Other Ambulatory Visit: Payer: Self-pay | Admitting: Family Medicine

## 2015-04-17 ENCOUNTER — Telehealth: Payer: Self-pay | Admitting: Family Medicine

## 2015-04-17 NOTE — Telephone Encounter (Signed)
Patient has gained three pounds overnight, he is not experiencing any shortness of breath and is taking Torsemide 100 mg, 1-1/2 tablets daily.  Do you recommend he make any changes or be seen?

## 2015-04-21 ENCOUNTER — Ambulatory Visit (INDEPENDENT_AMBULATORY_CARE_PROVIDER_SITE_OTHER): Payer: Medicare HMO | Admitting: Family Medicine

## 2015-04-21 ENCOUNTER — Encounter: Payer: Self-pay | Admitting: Family Medicine

## 2015-04-21 ENCOUNTER — Ambulatory Visit (INDEPENDENT_AMBULATORY_CARE_PROVIDER_SITE_OTHER): Payer: Medicare HMO

## 2015-04-21 VITALS — BP 117/60 | HR 86 | Temp 97.0°F | Ht 66.0 in | Wt 217.0 lb

## 2015-04-21 DIAGNOSIS — M5136 Other intervertebral disc degeneration, lumbar region: Secondary | ICD-10-CM | POA: Diagnosis not present

## 2015-04-21 DIAGNOSIS — J432 Centrilobular emphysema: Secondary | ICD-10-CM | POA: Diagnosis not present

## 2015-04-21 DIAGNOSIS — R259 Unspecified abnormal involuntary movements: Secondary | ICD-10-CM | POA: Diagnosis not present

## 2015-04-21 DIAGNOSIS — R7303 Prediabetes: Secondary | ICD-10-CM

## 2015-04-21 DIAGNOSIS — R0600 Dyspnea, unspecified: Secondary | ICD-10-CM

## 2015-04-21 MED ORDER — LACTULOSE 10 GM/15ML PO SOLN: 20.0000 g | Freq: Two times a day (BID) | ORAL | Status: DC

## 2015-04-21 MED ORDER — LEVALBUTEROL HCL 1.25 MG/0.5ML IN NEBU: 1.2500 mg | INHALATION_SOLUTION | Freq: Four times a day (QID) | RESPIRATORY_TRACT | Status: DC

## 2015-04-21 NOTE — Progress Notes (Signed)
Quick Note:  Your chest x-ray looked normal. Thanks, WS. ______ 

## 2015-04-21 NOTE — Progress Notes (Signed)
Subjective:  Patient ID: Derek Shepherd, male    DOB: 1956-02-04  Age: 60 y.o. MRN: [id number]  CC: Tremors; Constipation; and Joint Pain   HPI Derek Shepherd presents for 4 days of feeling draned all over. Tremors noted all over - primarily arms and hands. Able to walk around house for ADLs. Can no longer walk to mail box. Using pulse ox. O2 staying 91-97. Tires easily . Using symbicort. Requests a nebulizer. Not using albuterol currently. Son present as well as wife. Both say he tremors when picking up a glass of fluid such as orange juice. Memory not good. Feels dyspneic even when pulse ox is good. Poor exercise tolerance.     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   He has past surgical history that includes Cholecystectomy; Lung surgery (Left); Exploratory laparotomy; Hernia repair; Colonoscopy w/ biopsies; and Bone marrow transplant (2011).   His family history includes Clotting disorder in his mother; Diabetes in his brother, father, maternal aunt, and sister; Heart attack in his father and mother; Prostate cancer in his brother.He reports that he quit smoking about 21 years ago. His smoking use included Cigarettes. He has never used smokeless tobacco. He reports that he does not drink alcohol or use illicit drugs.    ROS Review of Systems  Constitutional: Positive for fatigue. Negative for fever, chills, diaphoresis and unexpected weight change.  HENT: Negative for congestion, hearing loss, rhinorrhea and  sore throat.   Eyes: Negative for visual disturbance.  Respiratory: Positive for shortness of breath. Negative for cough.   Cardiovascular: Negative for chest pain.  Gastrointestinal: Negative for abdominal pain, diarrhea and constipation.  Genitourinary: Negative for dysuria and flank pain.  Musculoskeletal: Negative for joint swelling and arthralgias.  Skin: Negative for rash.  Neurological: Positive for dizziness, tremors, weakness and light-headedness. Negative for syncope, speech difficulty and headaches.  Psychiatric/Behavioral: Negative for sleep disturbance and dysphoric mood.    Objective:  BP 117/60 mmHg  Pulse 86  Temp(Src) 97 F (36.1 C) (Oral)  Ht 5' 6"  (1.676 m)  Wt 217 lb (98.431 kg)  BMI 35.04 kg/m2  SpO2 94%  BP Readings from Last 3 Encounters:  04/21/15 117/60  04/10/15 132/71  03/10/15 130/70    Wt Readings from Last 3 Encounters:  04/21/15 217 lb (98.431 kg)  04/10/15 219 lb (99.338 kg)  03/10/15 217 lb 6 oz (98.601 kg)     Physical Exam  Constitutional: He is oriented to person, place, and time. He appears well-developed and well-nourished. No distress.  HENT:  Head: Normocephalic and atraumatic.  Right Ear: External ear normal.  Left Ear: External ear normal.  Nose: Nose normal.  Mouth/Throat: Oropharynx is clear and moist.  Eyes: Conjunctivae and EOM are normal. Pupils are equal, round, and reactive to light.  Neck: Normal range of motion. Neck supple. No thyromegaly present.  Cardiovascular: Normal rate, regular rhythm and normal heart sounds.   No murmur heard. Pulmonary/Chest: Effort normal and breath sounds normal. No respiratory distress. He has no wheezes. He has no rales.  Abdominal: Soft. Bowel sounds are normal. He exhibits no distension. There is no tenderness.  Lymphadenopathy:    He  has no cervical adenopathy.  Neurological: He is alert and oriented to person, place, and time. He has normal reflexes.  Skin: Skin is warm and dry.    Psychiatric: His behavior is normal.     Lab Results  Component Value Date   WBC 4.3 01/12/2015   HGB 8.5* 12/30/2014   HCT 25.8* 01/12/2015   PLT 83* 01/12/2015   GLUCOSE 179* 03/03/2015   CHOL 214* 12/01/2014   TRIG 290* 12/01/2014   HDL 48 12/01/2014   LDLCALC 108* 12/01/2014   ALT CANCELED 02/13/2015   ALT 18 02/13/2015   AST CANCELED 02/13/2015   AST 21 02/13/2015   NA 145* 03/03/2015   K 3.7 03/03/2015   CL 92* 03/03/2015   CREATININE 0.87 03/03/2015   BUN 13 03/03/2015   CO2 38* 03/03/2015   PSA 1.0 02/15/2013   INR 1.1* 07/14/2014   HGBA1C 5.6 12/27/2014    Dg Chest 2 View  12/26/2014  CLINICAL DATA:  Shortness of breath. EXAM: CHEST  2 VIEW COMPARISON:  June 21, 2013. FINDINGS: The heart size and mediastinal contours are within normal limits. Both lungs are clear. No pneumothorax or pleural effusion is noted. Right internal jugular Port-A-Cath is noted with distal tip at expected position of cavoatrial junction. Postsurgical changes are seen involving left rib. IMPRESSION: No active cardiopulmonary disease. Electronically Signed   By: Derek Shepherd, M.D.   On: 12/26/2014 21:52    Assessment & Plan:   Derek Shepherd was seen today for tremors, constipation and joint pain.  Diagnoses and all orders for this visit:  Abnormal involuntary movements  Centrilobular emphysema (Derek Shepherd) -     DME Nebulizer machine  DDD (degenerative disc disease), lumbar  Pre-diabetes  Dyspnea -     CBC with Differential/Platelet -     D-dimer, quantitative (not at [facility]) -     CMP14+EGFR -     Brain natriuretic peptide -     DG Chest 2 View; Future -     DME Nebulizer machine  Other orders -     lactulose (CHRONULAC) 10 GM/15ML solution; Take 30 mLs (20 g total) by mouth 2 (two) times daily. For constipation -     levalbuterol (XOPENEX) 1.25 MG/0.5ML nebulizer solution; Take 1.25 mg by nebulization 4 (four) times daily.     I have discontinued Mr. Derek Shepherd's lactulose and  sulfamethoxazole-trimethoprim. I am also having him start on lactulose and levalbuterol. Additionally, I am having him maintain his fentaNYL, Multiple Vitamins, Polyethyl Glycol-Propyl Glycol, folic acid, vitamin H-03, albuterol, Magnesium Chloride-Calcium, OXYGEN, budesonide-formoterol, SPIRIVA HANDIHALER, eucerin, polyethylene glycol powder, rOPINIRole, ALPRAZolam, promethazine, potassium chloride, torsemide, pantoprazole, Sirolimus, montelukast, citalopram, fluconazole, Probiotic Product (TRUBIOTICS PO), Linaclotide, ONE TOUCH ULTRA TEST, traZODone, and cyclobenzaprine.  Meds ordered this encounter  Medications  . lactulose (CHRONULAC) 10 GM/15ML solution    Sig: Take 30 mLs (20 g total) by mouth 2 (two) times daily. For constipation    Dispense:  1892 mL    Refill:  5  . levalbuterol (XOPENEX) 1.25 MG/0.5ML nebulizer solution    Sig: Take 1.25 mg by nebulization 4 (four) times daily.    Dispense:  120 each    Refill:  12     Follow-up: Return in about 1 month (around 05/19/2015), or if symptoms worsen or fail to improve.  Claretta Fraise, M.D.

## 2015-04-22 ENCOUNTER — Emergency Department (HOSPITAL_COMMUNITY)
Admission: EM | Admit: 2015-04-22 | Discharge: 2015-04-22 | Disposition: A | Discharge status: Home / Self Care | Payer: Medicare HMO | Attending: Emergency Medicine | Admitting: Emergency Medicine

## 2015-04-22 ENCOUNTER — Emergency Department (HOSPITAL_COMMUNITY): Payer: Medicare HMO

## 2015-04-22 ENCOUNTER — Other Ambulatory Visit: Payer: Self-pay | Admitting: Family Medicine

## 2015-04-22 ENCOUNTER — Encounter (HOSPITAL_COMMUNITY): Payer: Self-pay | Admitting: Emergency Medicine

## 2015-04-22 DIAGNOSIS — D649 Anemia, unspecified: Secondary | ICD-10-CM | POA: Diagnosis not present

## 2015-04-22 DIAGNOSIS — K219 Gastro-esophageal reflux disease without esophagitis: Secondary | ICD-10-CM | POA: Insufficient documentation

## 2015-04-22 DIAGNOSIS — I509 Heart failure, unspecified: Secondary | ICD-10-CM | POA: Diagnosis not present

## 2015-04-22 DIAGNOSIS — F329 Major depressive disorder, single episode, unspecified: Secondary | ICD-10-CM | POA: Diagnosis not present

## 2015-04-22 DIAGNOSIS — Z79899 Other long term (current) drug therapy: Secondary | ICD-10-CM | POA: Insufficient documentation

## 2015-04-22 DIAGNOSIS — M1990 Unspecified osteoarthritis, unspecified site: Secondary | ICD-10-CM | POA: Diagnosis not present

## 2015-04-22 DIAGNOSIS — R140 Abdominal distension (gaseous): Secondary | ICD-10-CM | POA: Diagnosis not present

## 2015-04-22 DIAGNOSIS — F419 Anxiety disorder, unspecified: Secondary | ICD-10-CM | POA: Diagnosis not present

## 2015-04-22 DIAGNOSIS — R0689 Other abnormalities of breathing: Secondary | ICD-10-CM

## 2015-04-22 DIAGNOSIS — Z87891 Personal history of nicotine dependence: Secondary | ICD-10-CM | POA: Insufficient documentation

## 2015-04-22 DIAGNOSIS — R0600 Dyspnea, unspecified: Secondary | ICD-10-CM

## 2015-04-22 DIAGNOSIS — R109 Unspecified abdominal pain: Secondary | ICD-10-CM | POA: Insufficient documentation

## 2015-04-22 DIAGNOSIS — Z86010 Personal history of colonic polyps: Secondary | ICD-10-CM | POA: Insufficient documentation

## 2015-04-22 DIAGNOSIS — Z86718 Personal history of other venous thrombosis and embolism: Secondary | ICD-10-CM | POA: Diagnosis not present

## 2015-04-22 DIAGNOSIS — R197 Diarrhea, unspecified: Secondary | ICD-10-CM | POA: Diagnosis not present

## 2015-04-22 DIAGNOSIS — Z856 Personal history of leukemia: Secondary | ICD-10-CM | POA: Diagnosis not present

## 2015-04-22 DIAGNOSIS — R0602 Shortness of breath: Secondary | ICD-10-CM | POA: Diagnosis present

## 2015-04-22 DIAGNOSIS — J449 Chronic obstructive pulmonary disease, unspecified: Secondary | ICD-10-CM

## 2015-04-22 DIAGNOSIS — Z9981 Dependence on supplemental oxygen: Secondary | ICD-10-CM | POA: Insufficient documentation

## 2015-04-22 DIAGNOSIS — J441 Chronic obstructive pulmonary disease with (acute) exacerbation: Secondary | ICD-10-CM | POA: Diagnosis not present

## 2015-04-22 LAB — CBC WITH DIFFERENTIAL/PLATELET
BASOS PCT: 1 %
BASOS: 0 %
Basophils Absolute: 0 10*3/uL (ref 0.0–0.2)
Basophils Absolute: 0 10*3/uL (ref 0.0–0.1)
EOS (ABSOLUTE): 0.1 10*3/uL (ref 0.0–0.4)
EOS: 1 %
Eosinophils Absolute: 0.1 10*3/uL (ref 0.0–0.7)
Eosinophils Relative: 2 %
HEMATOCRIT: 29.1 % — AB (ref 37.5–51.0)
HEMATOCRIT: 31.2 % — AB (ref 39.0–52.0)
HEMOGLOBIN: 9.9 g/dL — AB (ref 13.0–17.0)
Hemoglobin: 9.4 g/dL — ABNORMAL LOW (ref 12.6–17.7)
IMMATURE GRANS (ABS): 0 10*3/uL (ref 0.0–0.1)
IMMATURE GRANULOCYTES: 0 %
LYMPHS: 17 %
Lymphocytes Absolute: 1.1 10*3/uL (ref 0.7–3.1)
Lymphocytes Relative: 26 %
Lymphs Abs: 1.1 10*3/uL (ref 0.7–4.0)
MCH: 34.1 pg — ABNORMAL HIGH (ref 26.6–33.0)
MCH: 35.1 pg — ABNORMAL HIGH (ref 26.0–34.0)
MCHC: 32.3 g/dL (ref 31.5–35.7)
MCHC: 31.7 g/dL (ref 30.0–36.0)
MCV: 105 fL — AB (ref 79–97)
MCV: 110.6 fL — ABNORMAL HIGH (ref 78.0–100.0)
MONOS PCT: 11 %
Monocytes Absolute: 0.6 10*3/uL (ref 0.1–0.9)
Monocytes Absolute: 0.4 10*3/uL (ref 0.1–1.0)
Monocytes: 9 %
NEUTROS ABS: 2.6 10*3/uL (ref 1.7–7.7)
NEUTROS PCT: 73 %
NEUTROS PCT: 61 %
Neutrophils Absolute: 4.5 10*3/uL (ref 1.4–7.0)
PLATELETS: 112 10*3/uL — AB (ref 150–379)
Platelets: 97 10*3/uL — ABNORMAL LOW (ref 150–400)
RBC: 2.76 x10E6/uL — ABNORMAL LOW (ref 4.14–5.80)
RBC: 2.82 MIL/uL — AB (ref 4.22–5.81)
RDW: 15.8 % — ABNORMAL HIGH (ref 12.3–15.4)
RDW: 15.1 % (ref 11.5–15.5)
WBC: 6.2 10*3/uL (ref 3.4–10.8)
WBC: 4.2 10*3/uL (ref 4.0–10.5)

## 2015-04-22 LAB — URINE MICROSCOPIC-ADD ON

## 2015-04-22 LAB — TROPONIN I

## 2015-04-22 LAB — CMP14+EGFR
ALBUMIN: 4 g/dL (ref 3.5–5.5)
ALK PHOS: 83 IU/L (ref 39–117)
ALT: 27 IU/L (ref 0–44)
AST: 29 IU/L (ref 0–40)
Albumin/Globulin Ratio: 1.8 (ref 1.1–2.5)
BUN / CREAT RATIO: 18 (ref 9–20)
BUN: 15 mg/dL (ref 6–24)
CALCIUM: 9.4 mg/dL (ref 8.7–10.2)
CHLORIDE: 90 mmol/L — AB (ref 96–106)
CO2: 39 mmol/L — AB (ref 18–29)
Creatinine, Ser: 0.84 mg/dL (ref 0.76–1.27)
GFR calc Af Amer: 112 mL/min/{1.73_m2} (ref >59)
GFR calc non Af Amer: 97 mL/min/{1.73_m2} (ref >59)
GLOBULIN, TOTAL: 2.2 g/dL (ref 1.5–4.5)
GLUCOSE: 110 mg/dL — AB (ref 65–99)
Potassium: 3.8 mmol/L (ref 3.5–5.2)
SODIUM: 142 mmol/L (ref 134–144)
Total Protein: 6.2 g/dL (ref 6.0–8.5)

## 2015-04-22 LAB — URINALYSIS, ROUTINE W REFLEX MICROSCOPIC
BILIRUBIN URINE: NEGATIVE
GLUCOSE, UA: NEGATIVE mg/dL
Ketones, ur: NEGATIVE mg/dL
Nitrite: NEGATIVE
PH: 7.5 (ref 5.0–8.0)
Protein, ur: NEGATIVE mg/dL
SPECIFIC GRAVITY, URINE: 1.008 (ref 1.005–1.030)

## 2015-04-22 LAB — BASIC METABOLIC PANEL
ANION GAP: 12 (ref 5–15)
BUN: 9 mg/dL (ref 6–20)
CALCIUM: 9.9 mg/dL (ref 8.9–10.3)
CO2: 36 mmol/L — AB (ref 22–32)
Chloride: 93 mmol/L — ABNORMAL LOW (ref 101–111)
Creatinine, Ser: 0.91 mg/dL (ref 0.61–1.24)
Glucose, Bld: 169 mg/dL — ABNORMAL HIGH (ref 65–99)
Potassium: 4 mmol/L (ref 3.5–5.1)
Sodium: 141 mmol/L (ref 135–145)

## 2015-04-22 LAB — BRAIN NATRIURETIC PEPTIDE
B Natriuretic Peptide: 73.6 pg/mL (ref 0.0–100.0)
BNP: 28.4 pg/mL (ref 0.0–100.0)

## 2015-04-22 LAB — D-DIMER, QUANTITATIVE: D-DIMER: 3.65 mg/L FEU — ABNORMAL HIGH (ref 0.00–0.49)

## 2015-04-22 MED ORDER — IOHEXOL 350 MG/ML SOLN
80.0000 mL | Freq: Once | INTRAVENOUS | Status: AC | PRN
  Administered 2015-04-22: 80 mL via INTRAVENOUS

## 2015-04-22 MED ORDER — LOPERAMIDE HCL 2 MG PO CAPS
4.0000 mg | ORAL_CAPSULE | ORAL | Status: DC | PRN
  Administered 2015-04-22: 4 mg via ORAL
  Filled 2015-04-22: qty 2

## 2015-04-22 NOTE — Discharge Instructions (Signed)
Read the information below.  You may return to the Emergency Department at any time for worsening condition or any new symptoms that concern you.  If you develop worsening chest pain, shortness of breath, fever, you pass out, or become weak or dizzy, return to the ER for a recheck.       Shortness of Breath Shortness of breath means you have trouble breathing. It could also mean that you have a medical problem. You should get immediate medical care for shortness of breath. CAUSES   Not enough oxygen in the air such as with high altitudes or a smoke-filled room.  Certain lung diseases, infections, or problems.  Heart disease or conditions, such as angina or heart failure.  Low red blood cells (anemia).  Poor physical fitness, which can cause shortness of breath when you exercise.  Chest or back injuries or stiffness.  Being overweight.  Smoking.  Anxiety, which can make you feel like you are not getting enough air. DIAGNOSIS  Serious medical problems can often be found during your physical exam. Tests may also be done to determine why you are having shortness of breath. Tests may include:  Chest X-rays.  Lung function tests.  Blood tests.  An electrocardiogram (ECG).  An ambulatory electrocardiogram. An ambulatory ECG records your heartbeat patterns over a 24-hour period.  Exercise testing.  A transthoracic echocardiogram (TTE). During echocardiography, sound waves are used to evaluate how blood flows through your heart.  A transesophageal echocardiogram (TEE).  Imaging scans. Your health care provider may not be able to find a cause for your shortness of breath after your exam. In this case, it is important to have a follow-up exam with your health care provider as directed.  TREATMENT  Treatment for shortness of breath depends on the cause of your symptoms and can vary greatly. HOME CARE INSTRUCTIONS   Do not smoke. Smoking is a common cause of shortness of breath.  If you smoke, ask for help to quit.  Avoid being around chemicals or things that may bother your breathing, such as paint fumes and dust.  Rest as needed. Slowly resume your usual activities.  If medicines were prescribed, take them as directed for the full length of time directed. This includes oxygen and any inhaled medicines.  Keep all follow-up appointments as directed by your health care provider. SEEK MEDICAL CARE IF:   Your condition does not improve in the time expected.  You have a hard time doing your normal activities even with rest.  You have any new symptoms. SEEK IMMEDIATE MEDICAL CARE IF:   Your shortness of breath gets worse.  You feel light-headed, faint, or develop a cough not controlled with medicines.  You start coughing up blood.  You have pain with breathing.  You have chest pain or pain in your arms, shoulders, or abdomen.  You have a fever.  You are unable to walk up stairs or exercise the way you normally do. MAKE SURE YOU:  Understand these instructions.  Will watch your condition.  Will get help right away if you are not doing well or get worse.   This information is not intended to replace advice given to you by your health care provider. Make sure you discuss any questions you have with your health care provider.   Document Released: 11/16/2000 Document Revised: 02/26/2013 Document Reviewed: 05/09/2011 Elsevier Interactive Patient Education [employer].

## 2015-04-22 NOTE — ED Notes (Signed)
Spoke with Dr. Stark Jock - okay to hold on orders until MD sees.   Patient has blood work and xray from yesterday in system.

## 2015-04-22 NOTE — ED Notes (Signed)
Patient went to primary doctor yesterday for shortness of breath.  Patient had xray of chest and blood work at MD office.  The office called patient back today and told him to come to ED for pulmonary embolism.  They advised that his d-dimer was elevated.

## 2015-04-22 NOTE — ED Provider Notes (Signed)
CSN: [id number]     Arrival date & time 04/22/15  1000 History   First MD Initiated Contact with Patient 04/22/15 1102     Chief Complaint  Patient presents with  . pulmonary embolism     sent by MD     (Consider location/radiation/quality/duration/timing/severity/associated sxs/prior Treatment) The history is provided by the patient and a relative.     Pt with hx leukemia-lymphoma, DVT, PE, COPD, CHF sent in from PCP for elevated d-dimer, concern for PE.  Pt has hx PE, was taken off of xarelto for concern of possible internal bleeding.  He has become more SOB overall and has had DOE, unable to now walk to the end of his driveway without SOB and feeling exhausted (though he maintains normal O2 on pulsox).  Has also noted left lower chest pain described as soreness with deep inspiration.  Has had cough productive of mucus, nonbloody, for 4 months. His sputum production is unchanged.  Has been off xarelto for approximately 6 months.  Was seen by PCP yesterday for same with new prescriptions.   Also noted generalized myalgias, arthralgias x 1 week.   Pt notes multiple other chronic problems, all ongoing > 1 month including constipation and diarrhea (has one and treats it, develops the other.  Currently with diarrhea), has had episodes of urinating in bed and doing odd things in his sleep.  Takes multiple sedating medications at night (nucynta, trazadone, requip, fentanyl, xanax).  These are all being addressed by his PCP.    Past Medical History  Diagnosis Date  . Leukemia-lymphoma, T-cell, acute, HTLV-I-associated ([place])   . COPD (chronic obstructive pulmonary disease) ([place])   . DVT (deep venous thrombosis) ([place])   . Personal history of colonic  adenoma 01/22/2008  . Anemia   . Anxiety   . Arthritis   . Depression   . Gallstones   . Bowel obstruction ([facility])     constipation  . Adenomatous colon polyp     tubular  . Diverticulosis   . CHF (congestive heart failure) ([place])   . GERD  (gastroesophageal reflux disease)   . 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 Cholecystectomy    . Lung surgery Left   . Exploratory laparotomy    . Hernia repair      x 2  . Colonoscopy w/ biopsies    . Bone marrow transplant  2011   Family History  Problem Relation Age of Onset  . Prostate cancer Brother   . Clotting disorder Mother   . Diabetes Father   . Diabetes Brother     x 3  . Diabetes Sister     x 3  . Diabetes Maternal Aunt   . Heart attack Mother   . Heart attack Father    Social History  Substance Use Topics  . Smoking status: Former Smoker    Types: Cigarettes    Quit date: 07/13/1993  . Smokeless tobacco: Never Used  . Alcohol Use: 0.0 oz/week    0 Standard drinks or equivalent per week     Comment: socially    Review of Systems  All other systems reviewed and are negative.     Allergies  Nsaids  Home Medications   Prior to Admission medications   Medication Sig Start Date End Date Taking? Authorizing Provider  albuterol (PROVENTIL) (2.5 MG/3ML) 0.083% nebulizer solution Take 3 mLs (2.5 mg total) by nebulization every  6 (six) hours as needed for wheezing or shortness of breath. 05/24/14  Yes Claretta Fraise, MD  ALPRAZolam Duanne Moron) 0.25 MG tablet Take 0.5 tablets (0.125 mg total) by mouth at bedtime. Patient taking differently: Take 0.25 mg by mouth at bedtime.  02/13/15  Yes Claretta Fraise, MD  budesonide-formoterol [facility]) 160-4.5 MCG/ACT inhaler Inhale 2 puffs into the lungs every morning. 08/18/14  Yes Claretta Fraise, MD  citalopram (CELEXA) 20 MG tablet Take 20 mg by mouth every morning.   Yes Historical Provider, MD  cyclobenzaprine (FLEXERIL) 10 MG tablet TAKE ONE TABLET BY MOUTH THREE TIMES DAILY AS NEEDED FOR HEADACHE 04/17/15  Yes Claretta Fraise, MD  fentaNYL (DURAGESIC - DOSED MCG/HR) 25 MCG/HR patch Place 25 mcg onto the skin every other day.   01/22/13  Yes Historical Provider, MD  fluconazole (DIFLUCAN) 200 MG tablet Take 200 mg by mouth daily.   Yes Historical Provider, MD  folic acid (FOLVITE) 1 MG tablet Take 1 mg by mouth daily.  02/11/14  Yes Historical Provider, MD  lactulose (CHRONULAC) 10 GM/15ML solution Take 30 mLs (20 g total) by mouth 2 (two) times daily. For constipation 04/21/15  Yes Claretta Fraise, MD  levalbuterol Penne Lash) 1.25 MG/0.5ML nebulizer solution Take 1.25 mg by nebulization 4 (four) times daily. 04/21/15  Yes Claretta Fraise, MD  Linaclotide [facility]) 145 MCG CAPS capsule Take 1 capsule (145 mcg total) by mouth daily. 04/10/15  Yes Tammy Eckard, PHARMD  Magnesium Chloride-Calcium 64-106 MG TBEC Take 2 tablets by mouth daily.    Yes Historical Provider, MD  montelukast (SINGULAIR) 10 MG tablet Take 10 mg by mouth daily. 03/23/15  Yes Historical Provider, MD  Multiple Vitamins tablet Take 1 tablet by mouth daily.    Yes Historical Provider, MD  pantoprazole (PROTONIX) 40 MG tablet TAKE ONE TABLET BY MOUTH ONCE DAILY IN THE MORNING 03/31/15  Yes Gatha Mayer, MD  Polyethyl Glycol-Propyl Glycol (SYSTANE PRESERVATIVE FREE) 0.4-0.3 % SOLN Place 1 drop into both eyes daily as needed. For dry eyes   Yes Historical Provider, MD  polyethylene glycol powder (GLYCOLAX/MIRALAX) powder Take 17 g by mouth 2 (two) times daily as needed. 01/27/15  Yes Claretta Fraise, MD  potassium chloride (MICRO-K) 10 MEQ CR capsule Take 3 capsules (30 mEq total) by mouth 2 (two) times daily. Takes 2 capsules daily Patient taking differently: Take 30 mEq by mouth 2 (two) times daily.  03/04/15 10/19/16 Yes Claretta Fraise, MD  Probiotic Product (TRUBIOTICS PO) Take by mouth.   Yes Historical Provider, MD  promethazine (PHENERGAN) 25 MG tablet TAKE ONE TABLET BY MOUTH 4 TIMES DAILY AS NEEDED FOR NAUSEA 02/25/15  Yes Eustaquio Maize, MD  rOPINIRole (REQUIP) 1 MG tablet TAKE 1 TABLET BY MOUTH AT BEDTIME 02/05/15  Yes Historical Provider, MD  Sirolimus 0.5 MG  TABS Take 1 capsule by mouth daily. 03/31/15  Yes Historical Provider, MD  Skin Protectants, Misc. (EUCERIN) cream Apply 1 application topically daily.   Yes Historical Provider, MD  SPIRIVA HANDIHALER 18 MCG inhalation capsule Place 1 capsule (18 mcg total) into inhaler and inhale daily. 12/18/14  Yes Claretta Fraise, MD  torsemide (DEMADEX) 100 MG tablet Take 1.5 tablets (150 mg total) by mouth daily. 03/25/15  Yes Claretta Fraise, MD  traZODone (DESYREL) 150 MG tablet TAKE ONE OR TWO TABLETS BY MOUTH AT BEDTIME FOR SLEEP 04/15/15  Yes Claretta Fraise, MD  vitamin B-12 (CYANOCOBALAMIN) 1000 MCG tablet Take 1,000 mcg by mouth.   Yes Historical Provider, MD  ONE [facility]  ULTRA TEST test strip USE ONE STRIP TO CHECK GLUCOSE ONCE DAILY IN THE MORNING FOR FASTING BLOOD SUGAR 04/14/15   Claretta Fraise, MD  OXYGEN Inhale into the lungs. Continuous 5 LPM when up and about 3 LPM constant and at night    Historical Provider, MD   BP 129/90 mmHg  Pulse 64  Temp(Src) 97.8 F (36.6 C) (Oral)  Resp 20  Ht 5\' 6"  (1.676 m)  Wt 98.431 kg  BMI 35.04 kg/m2  SpO2 99% Physical Exam  Constitutional: He appears well-developed and well-nourished. No distress.  HENT:  Head: Normocephalic and atraumatic.  Neck: Neck supple.  Cardiovascular: Normal rate and regular rhythm.   Pulmonary/Chest: Effort normal and breath sounds normal. No respiratory distress. He has no wheezes. He has no rales.  Abdominal: Soft. He exhibits distension. He exhibits no mass. There is tenderness. There is no rebound and no guarding.  Neurological: He is alert. He exhibits normal muscle tone.  Skin: He is not diaphoretic.  Nursing note and vitals reviewed.   ED Course  Procedures (including critical care time) Labs Review Labs Reviewed  BASIC METABOLIC PANEL - Abnormal; Notable for the following:    Chloride 93 (*)    CO2 36 (*)    Glucose, Bld 169 (*)    All other components within normal limits  CBC WITH DIFFERENTIAL/PLATELET - Abnormal;  Notable for the following:    RBC 2.82 (*)    Hemoglobin 9.9 (*)    HCT 31.2 (*)    MCV 110.6 (*)    MCH 35.1 (*)    Platelets 97 (*)    All other components within normal limits  URINALYSIS, ROUTINE W REFLEX MICROSCOPIC (NOT AT [facility]) - Abnormal; Notable for the following:    Hgb urine dipstick TRACE (*)    Leukocytes, UA TRACE (*)    All other components within normal limits  URINE MICROSCOPIC-ADD ON - Abnormal; Notable for the following:    Squamous Epithelial / LPF 0-5 (*)    Bacteria, UA RARE (*)    All other components within normal limits  TROPONIN I  BRAIN NATRIURETIC PEPTIDE    Imaging Review Dg Chest 2 View  04/21/2015  CLINICAL DATA:  Dyspnea; history of COPD, acute lymphoid leukemia in remission, CHF EXAM: CHEST  2 VIEW COMPARISON:  Chest x-ray of February 23, 2015 FINDINGS: The lungs are mildly hypoinflated. There is no focal infiltrate. There are numerous surgical wire is encompassing the fifth and sixth ribs posterior laterally on the left. Several of these wires are broken but this is a stable finding. The heart is normal in size. The pulmonary vascularity is not engorged. The Port-A-Cath appliance tip projects over the midportion of the SVC. The observed thoracic spine is unremarkable. IMPRESSION: Mild hypo inflation, chronic. There is no active cardiopulmonary disease. Electronically Signed   By: David  Martinique M.D.   On: 04/21/2015 11:42   I have personally reviewed and evaluated these images and lab results as part of my medical decision-making.   EKG Interpretation None      MDM   Final diagnoses:  Dyspnea  Chronic obstructive pulmonary disease, unspecified COPD type (HCC)  Chronic anemia  Diarrhea, unspecified type    Afebrile nontoxic patient with multiple medical problems, sent in to r/o PE due to elevated d-dimer.  Pt has had increased DOE, fatigue over the past 4 days - 1 week.  Labs at baseline.  UA does not appear infection.  CT angio chest negative  for  clot or other significant abnormality.  Patient's concerns are largely chronic and this new dyspnea is being investigated by his PCP.  Evaluation reassuring here.  Labs drawn yesterday at PCP also reviewed.  D/C home with PCP follow up.  Discussed result, findings, treatment, and follow up  with patient.  Pt given return precautions.  Pt verbalizes understanding and agrees with plan.        Clayton Bibles, PA-C 04/22/15 1618  Charlesetta Shanks, MD 04/22/15 [phone]

## 2015-04-24 ENCOUNTER — Ambulatory Visit (HOSPITAL_COMMUNITY): Payer: Medicare HMO

## 2015-04-27 ENCOUNTER — Ambulatory Visit (HOSPITAL_COMMUNITY): Admission: RE | Admit: 2015-04-27 | Payer: Medicare HMO | Source: Ambulatory Visit

## 2015-04-27 ENCOUNTER — Telehealth: Payer: Self-pay | Admitting: Family Medicine

## 2015-04-30 ENCOUNTER — Encounter: Payer: Self-pay | Admitting: Family Medicine

## 2015-04-30 ENCOUNTER — Ambulatory Visit (INDEPENDENT_AMBULATORY_CARE_PROVIDER_SITE_OTHER): Payer: Medicare HMO | Admitting: Family Medicine

## 2015-04-30 VITALS — BP 97/56 | HR 78 | Temp 97.2°F | Ht 66.0 in | Wt 216.4 lb

## 2015-04-30 DIAGNOSIS — J431 Panlobular emphysema: Secondary | ICD-10-CM

## 2015-04-30 DIAGNOSIS — R0600 Dyspnea, unspecified: Secondary | ICD-10-CM

## 2015-04-30 DIAGNOSIS — K5901 Slow transit constipation: Secondary | ICD-10-CM

## 2015-04-30 MED ORDER — ONETOUCH ULTRASOFT LANCETS MISC: Status: DC

## 2015-04-30 NOTE — Patient Instructions (Signed)
Use linzess once a day, every day. If you have no bowel movement for 2 days, on the third day use miralax. Use it twice a day, according to directions until bowels move normally.

## 2015-04-30 NOTE — Progress Notes (Signed)
Subjective:  Patient ID: Derek Shepherd, male    DOB: 1956/01/22  Age: 60 y.o. MRN: [id number]  CC: discuss CT and Constipation   HPI Derek Shepherd presents for constipation used linzess BID due to constipation, then developed diarrhea. Also using lactulose given bby pain doctor. Not using the miralax.   Gets winded after walking a few feet, but his pulse ox stays in the 90s (has his own meter).    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   He has past surgical history that includes Cholecystectomy; Lung surgery (Left); Exploratory laparotomy; Hernia repair; Colonoscopy w/ biopsies; and Bone marrow transplant (2011).   His family history includes Clotting disorder in his mother; Diabetes in his brother, father, maternal aunt, and sister; Heart attack in his father and mother; Prostate cancer in his brother.He reports that he quit smoking about 21 years ago. His smoking use included Cigarettes. He has never used smokeless tobacco. He reports that he drinks alcohol. He reports that he does not use illicit drugs.    ROS Review of Systems  Constitutional: Negative for fever, chills and diaphoresis.  HENT: Negative for rhinorrhea and sore throat.   Respiratory: Positive for shortness of breath. Negative for cough.   Cardiovascular: Negative for chest pain.  Gastrointestinal: Positive for constipation. Negative for abdominal pain.  Musculoskeletal: Negative for myalgias and arthralgias.  Skin: Negative for rash.    Neurological: Negative for weakness and headaches.    Objective:  BP 97/56 mmHg  Pulse 78  Temp(Src) 97.2 F (36.2 C) (Oral)  Ht 5\' 6"  (1.676 m)  Wt 216 lb 6.4 oz (98.158 kg)  BMI 34.94 kg/m2  SpO2 93%  BP Readings from Last 3 Encounters:  04/30/15 97/56  04/22/15 109/75  04/21/15 117/60    Wt Readings from Last 3 Encounters:  04/30/15 216 lb 6.4 oz (98.158 kg)  04/22/15 217 lb (98.431 kg)  04/21/15 217 lb (98.431 kg)     Physical Exam  Constitutional: He appears well-developed and well-nourished.  HENT:  Head: Normocephalic and atraumatic.  Right Ear: Tympanic membrane and external ear normal. No decreased hearing is noted.  Left Ear: Tympanic membrane and external ear normal. No decreased hearing is noted.  Mouth/Throat: No oropharyngeal exudate or posterior oropharyngeal erythema.  Eyes: Pupils are equal, round, and reactive to light.  Neck: Normal range of motion. Neck supple.  Cardiovascular: Normal rate and regular rhythm.   No murmur heard. Pulmonary/Chest: Effort normal. No respiratory distress. He has no wheezes. He has no rales.  Pt. Wearing 3 liter [place]. Comfortable at rest.  Distant breath sounds   Abdominal: Soft. Bowel sounds are normal. He exhibits no mass. There is no tenderness.  Vitals reviewed.    Lab Results  Component Value Date   WBC 4.2 04/22/2015   HGB 9.9* 04/22/2015   HCT 31.2* 04/22/2015   PLT 97* 04/22/2015   GLUCOSE 169* 04/22/2015   CHOL 214* 12/01/2014   TRIG 290* 12/01/2014   HDL 48 12/01/2014   LDLCALC 108* 12/01/2014   ALT 27 04/21/2015   AST 29 04/21/2015   NA 141 04/22/2015   K 4.0 04/22/2015   CL 93* 04/22/2015  CREATININE 0.91 04/22/2015   BUN 9 04/22/2015   CO2 36* 04/22/2015   PSA 1.0 02/15/2013   INR 1.1* 07/14/2014   HGBA1C 5.6 12/27/2014    Ct Angio Chest Pe W/cm &/or Wo Cm  04/22/2015  CLINICAL DATA:  Shortness of breath and chest pain for 1 month. Previous history of DVT and pulmonary embolism.  Personal history of colon carcinoma and acute lymphocytic leukemia. EXAM: CT ANGIOGRAPHY CHEST WITH CONTRAST TECHNIQUE: Multidetector CT imaging of the chest was performed using the standard protocol during bolus administration of intravenous contrast. Multiplanar CT image reconstructions and MIPs were obtained to evaluate the vascular anatomy. CONTRAST:  90mL OMNIPAQUE IOHEXOL 350 MG/ML SOLN COMPARISON:  12/29/2014 FINDINGS: Mediastinum/Lymph Nodes: No pulmonary emboli or thoracic aortic dissection identified. No masses or pathologically enlarged lymph nodes identified. No evidence of pericardial effusion. Lungs/Pleura: No pulmonary mass, consolidation, or effusion. Upper abdomen: Small posterior right hepatic lobe cyst remains stable. Tiny less than 5 mm nonobstructive calculus in upper pole of left kidney also unchanged. Musculoskeletal: No chest wall mass or suspicious bone lesions identified. Left rib cerclage wires again seen. Review of the MIP images confirms the above findings. IMPRESSION: Stable exam. No evidence of pulmonary embolism or other acute findings. Electronically Signed   By: Earle Gell M.D.   On: 04/22/2015 13:53    Assessment & Plan:   [name] was seen today for discuss ct and constipation.  Diagnoses and all orders for this visit:  Slow transit constipation  Dyspnea  Panlobular emphysema ([place])  Other orders -     Lancets (ONETOUCH ULTRASOFT) lancets; Use as instructed   Use linzess once a day, every day. If you have no bowel movement for 2 days, on the third day use miralax. Use it twice a day, according to directions until bowels move normally.  I am having Mr. Joslin start on [employer]. I am also having him maintain his fentaNYL, Multiple Vitamins, Polyethyl Glycol-Propyl Glycol, folic acid, vitamin 0000000, albuterol, Magnesium Chloride-Calcium, OXYGEN, budesonide-formoterol, SPIRIVA HANDIHALER, eucerin, polyethylene glycol powder, rOPINIRole, ALPRAZolam,  promethazine, potassium chloride, torsemide, pantoprazole, Sirolimus, montelukast, citalopram, fluconazole, Probiotic Product (TRUBIOTICS PO), Linaclotide, ONE TOUCH ULTRA TEST, traZODone, cyclobenzaprine, lactulose, and levalbuterol.  Meds ordered this encounter  Medications  . Lancets (ONETOUCH ULTRASOFT) lancets    Sig: Use as instructed    Dispense:  100 each    Refill:  12     Follow-up: Return in about 1 month (around 05/28/2015), or if symptoms worsen or fail to improve.  Claretta Fraise, M.D.

## 2015-05-03 ENCOUNTER — Emergency Department (EMERGENCY_DEPARTMENT_HOSPITAL): Admit: 2015-05-03 | Discharge: 2015-05-03 | Disposition: A | Discharge status: Home / Self Care | Payer: Medicare HMO

## 2015-05-03 ENCOUNTER — Encounter (HOSPITAL_COMMUNITY): Payer: Self-pay | Admitting: Nurse Practitioner

## 2015-05-03 ENCOUNTER — Emergency Department (HOSPITAL_COMMUNITY)
Admission: EM | Admit: 2015-05-03 | Discharge: 2015-05-03 | Disposition: A | Discharge status: Home / Self Care | Payer: Medicare HMO | Attending: Emergency Medicine | Admitting: Emergency Medicine

## 2015-05-03 ENCOUNTER — Emergency Department (HOSPITAL_COMMUNITY): Payer: Medicare HMO

## 2015-05-03 DIAGNOSIS — I509 Heart failure, unspecified: Secondary | ICD-10-CM | POA: Diagnosis not present

## 2015-05-03 DIAGNOSIS — Z86010 Personal history of colonic polyps: Secondary | ICD-10-CM | POA: Diagnosis not present

## 2015-05-03 DIAGNOSIS — R197 Diarrhea, unspecified: Secondary | ICD-10-CM | POA: Diagnosis not present

## 2015-05-03 DIAGNOSIS — F329 Major depressive disorder, single episode, unspecified: Secondary | ICD-10-CM | POA: Diagnosis not present

## 2015-05-03 DIAGNOSIS — Z87891 Personal history of nicotine dependence: Secondary | ICD-10-CM | POA: Diagnosis not present

## 2015-05-03 DIAGNOSIS — R531 Weakness: Secondary | ICD-10-CM

## 2015-05-03 DIAGNOSIS — F419 Anxiety disorder, unspecified: Secondary | ICD-10-CM | POA: Diagnosis not present

## 2015-05-03 DIAGNOSIS — K219 Gastro-esophageal reflux disease without esophagitis: Secondary | ICD-10-CM | POA: Diagnosis not present

## 2015-05-03 DIAGNOSIS — Z86718 Personal history of other venous thrombosis and embolism: Secondary | ICD-10-CM | POA: Diagnosis not present

## 2015-05-03 DIAGNOSIS — D649 Anemia, unspecified: Secondary | ICD-10-CM | POA: Diagnosis not present

## 2015-05-03 DIAGNOSIS — Z79899 Other long term (current) drug therapy: Secondary | ICD-10-CM | POA: Diagnosis not present

## 2015-05-03 DIAGNOSIS — M79609 Pain in unspecified limb: Secondary | ICD-10-CM | POA: Diagnosis not present

## 2015-05-03 DIAGNOSIS — K5900 Constipation, unspecified: Secondary | ICD-10-CM | POA: Diagnosis not present

## 2015-05-03 DIAGNOSIS — J449 Chronic obstructive pulmonary disease, unspecified: Secondary | ICD-10-CM | POA: Diagnosis not present

## 2015-05-03 DIAGNOSIS — M1990 Unspecified osteoarthritis, unspecified site: Secondary | ICD-10-CM | POA: Diagnosis not present

## 2015-05-03 LAB — URINE MICROSCOPIC-ADD ON

## 2015-05-03 LAB — COMPREHENSIVE METABOLIC PANEL
ALK PHOS: 80 U/L (ref 38–126)
ALT: 27 U/L (ref 17–63)
AST: 28 U/L (ref 15–41)
Albumin: 3.5 g/dL (ref 3.5–5.0)
Anion gap: 14 (ref 5–15)
BUN: 15 mg/dL (ref 6–20)
CALCIUM: 9.7 mg/dL (ref 8.9–10.3)
CHLORIDE: 96 mmol/L — AB (ref 101–111)
CO2: 36 mmol/L — ABNORMAL HIGH (ref 22–32)
CREATININE: 0.94 mg/dL (ref 0.61–1.24)
Glucose, Bld: 132 mg/dL — ABNORMAL HIGH (ref 65–99)
Potassium: 3.7 mmol/L (ref 3.5–5.1)
Sodium: 146 mmol/L — ABNORMAL HIGH (ref 135–145)
Total Bilirubin: 0.6 mg/dL (ref 0.3–1.2)
Total Protein: 6.7 g/dL (ref 6.5–8.1)

## 2015-05-03 LAB — URINALYSIS, ROUTINE W REFLEX MICROSCOPIC
Bilirubin Urine: NEGATIVE
GLUCOSE, UA: NEGATIVE mg/dL
KETONES UR: NEGATIVE mg/dL
Nitrite: NEGATIVE
Protein, ur: NEGATIVE mg/dL
Specific Gravity, Urine: 1.009 (ref 1.005–1.030)
pH: 7 (ref 5.0–8.0)

## 2015-05-03 LAB — CBG MONITORING, ED: Glucose-Capillary: 150 mg/dL — ABNORMAL HIGH (ref 65–99)

## 2015-05-03 LAB — CBC
HCT: 31.1 % — ABNORMAL LOW (ref 39.0–52.0)
Hemoglobin: 9.5 g/dL — ABNORMAL LOW (ref 13.0–17.0)
MCH: 33.9 pg (ref 26.0–34.0)
MCHC: 30.5 g/dL (ref 30.0–36.0)
MCV: 111.1 fL — AB (ref 78.0–100.0)
PLATELETS: 101 10*3/uL — AB (ref 150–400)
RBC: 2.8 MIL/uL — AB (ref 4.22–5.81)
RDW: 15.1 % (ref 11.5–15.5)
WBC: 5.5 10*3/uL (ref 4.0–10.5)

## 2015-05-03 NOTE — ED Provider Notes (Signed)
CSN: [id number]     Arrival date & time 05/03/15  1354 History   First MD Initiated Contact with Patient 05/03/15 1552     Chief Complaint  Patient presents with  . Weakness     (Consider location/radiation/quality/duration/timing/severity/associated sxs/prior Treatment) HPI Complains of generalized weakness for the past 2 months, she reports intermittent incontinence of urine for several weeks. He fell onto his couch last night. He presently feels well. Other associated symptoms include bilateral lower extremity "soreness" though none now. And hallucinations for several months and his wife reports sometimes he "talks out of his head" he presently feels well. Other associated symptoms include intermittent constipation and diarrhea. He regularly uses laxatives. He had a bowel movement yesterday which was normal. Breathing is normal for him presently. Patient had CT angio of chest 04/21/2014 which was negative for pulmonary embolism. Had MRI of brain December 2015 showing no acute abnormality Past Medical History  Diagnosis Date  . Leukemia-lymphoma, T-cell, acute, HTLV-I-associated ([place])   . COPD (chronic obstructive pulmonary disease) ([place])   . DVT (deep venous thrombosis) ([place])   . Personal history of colonic  adenoma 01/22/2008  . Anemia   . Anxiety   . Arthritis   . Depression   . Gallstones   . Bowel obstruction (HCC)     constipation  . Adenomatous colon polyp     tubular  . Diverticulosis   . CHF (congestive heart failure) ([place])   . GERD (gastroesophageal reflux disease)   . 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 Cholecystectomy    . Lung surgery Left   . Exploratory laparotomy    . Hernia repair      x 2  . Colonoscopy w/ biopsies    . Bone marrow transplant  2011   Family History  Problem Relation Age of Onset  . Prostate cancer Brother   . Clotting disorder Mother   .  Diabetes Father   . Diabetes Brother     x 3  . Diabetes Sister     x 3  . Diabetes Maternal Aunt   . Heart attack Mother   . Heart attack Father    Social History  Substance Use Topics  . Smoking status: Former Smoker    Types: Cigarettes    Quit date: 07/13/1993  . Smokeless tobacco: Never Used  . Alcohol Use: 0.0 oz/week    0 Standard drinks or equivalent per week     Comment: socially    Review of Systems  HENT: Negative.   Respiratory: Negative.   Cardiovascular: Negative.   Gastrointestinal: Positive for diarrhea and constipation.  Genitourinary:       Urinary incontinence  Musculoskeletal: Negative.   Skin: Negative.   Allergic/Immunologic: Positive for immunocompromised state.       History of leukemia  Neurological: Positive for tremors and weakness.       Tremors for several months  Psychiatric/Behavioral: Negative.   All other systems reviewed and are negative.     Allergies  Nsaids  Home Medications   Prior to Admission medications   Medication Sig Start Date End Date Taking? Authorizing Provider  albuterol (PROVENTIL) (2.5 MG/3ML) 0.083% nebulizer solution Take 3 mLs (2.5 mg total) by nebulization every 6 (six) hours as needed for wheezing or shortness of breath. 05/24/14   Claretta Fraise, MD  ALPRAZolam Duanne Moron) 0.25 MG tablet Take 0.5 tablets (0.125 mg total) by mouth at  bedtime. Patient taking differently: Take 0.25 mg by mouth at bedtime.  02/13/15   Claretta Fraise, MD  budesonide-formoterol [facility]) 160-4.5 MCG/ACT inhaler Inhale 2 puffs into the lungs every morning. 08/18/14   Claretta Fraise, MD  citalopram (CELEXA) 20 MG tablet Take 20 mg by mouth every morning.    Historical Provider, MD  cyclobenzaprine (FLEXERIL) 10 MG tablet TAKE ONE TABLET BY MOUTH THREE TIMES DAILY AS NEEDED FOR HEADACHE 04/17/15   Claretta Fraise, MD  fentaNYL (DURAGESIC - DOSED MCG/HR) 25 MCG/HR patch Place 25 mcg onto the skin every other day.  01/22/13   Historical Provider,  MD  fluconazole (DIFLUCAN) 200 MG tablet Take 200 mg by mouth daily.    Historical Provider, MD  folic acid (FOLVITE) 1 MG tablet Take 1 mg by mouth daily.  02/11/14   Historical Provider, MD  lactulose (CHRONULAC) 10 GM/15ML solution Take 30 mLs (20 g total) by mouth 2 (two) times daily. For constipation 04/21/15   Claretta Fraise, MD  Lancets [facility] ULTRASOFT) lancets Use as instructed 04/30/15   Claretta Fraise, MD  levalbuterol Penne Lash) 1.25 MG/0.5ML nebulizer solution Take 1.25 mg by nebulization 4 (four) times daily. 04/21/15   Claretta Fraise, MD  Linaclotide [facility]) 145 MCG CAPS capsule Take 1 capsule (145 mcg total) by mouth daily. 04/10/15   Tammy Eckard, PHARMD  Magnesium Chloride-Calcium 64-106 MG TBEC Take 2 tablets by mouth daily.     Historical Provider, MD  montelukast (SINGULAIR) 10 MG tablet Take 10 mg by mouth daily. 03/23/15   Historical Provider, MD  Multiple Vitamins tablet Take 1 tablet by mouth daily.     Historical Provider, MD  ONE TOUCH ULTRA TEST test strip USE ONE STRIP TO CHECK GLUCOSE ONCE DAILY IN THE MORNING FOR FASTING BLOOD SUGAR 04/14/15   Claretta Fraise, MD  OXYGEN Inhale into the lungs. Continuous 5 LPM when up and about 3 LPM constant and at night    Historical Provider, MD  pantoprazole (PROTONIX) 40 MG tablet TAKE ONE TABLET BY MOUTH ONCE DAILY IN THE MORNING 03/31/15   Gatha Mayer, MD  Polyethyl Glycol-Propyl Glycol (SYSTANE PRESERVATIVE FREE) 0.4-0.3 % SOLN Place 1 drop into both eyes daily as needed. For dry eyes    Historical Provider, MD  polyethylene glycol powder (GLYCOLAX/MIRALAX) powder Take 17 g by mouth 2 (two) times daily as needed. 01/27/15   Claretta Fraise, MD  potassium chloride (MICRO-K) 10 MEQ CR capsule Take 3 capsules (30 mEq total) by mouth 2 (two) times daily. Takes 2 capsules daily Patient taking differently: Take 30 mEq by mouth 2 (two) times daily.  03/04/15 10/19/16  Claretta Fraise, MD  Probiotic Product (TRUBIOTICS PO) Take by mouth.     Historical Provider, MD  promethazine (PHENERGAN) 25 MG tablet TAKE ONE TABLET BY MOUTH 4 TIMES DAILY AS NEEDED FOR NAUSEA 02/25/15   Eustaquio Maize, MD  rOPINIRole (REQUIP) 1 MG tablet TAKE 1 TABLET BY MOUTH AT BEDTIME 02/05/15   Historical Provider, MD  Sirolimus 0.5 MG TABS Take 1 capsule by mouth daily. 03/31/15   Historical Provider, MD  Skin Protectants, Misc. (EUCERIN) cream Apply 1 application topically daily.    Historical Provider, MD  SPIRIVA HANDIHALER 18 MCG inhalation capsule Place 1 capsule (18 mcg total) into inhaler and inhale daily. 12/18/14   Claretta Fraise, MD  torsemide (DEMADEX) 100 MG tablet Take 1.5 tablets (150 mg total) by mouth daily. 03/25/15   Claretta Fraise, MD  traZODone (DESYREL) 150 MG tablet TAKE ONE OR  TWO TABLETS BY MOUTH AT BEDTIME FOR SLEEP 04/15/15   Claretta Fraise, MD  vitamin B-12 (CYANOCOBALAMIN) 1000 MCG tablet Take 1,000 mcg by mouth.    Historical Provider, MD   BP 141/81 mmHg  Pulse 62  Temp(Src) 98.1 F (36.7 C) (Oral)  Resp 12  SpO2 98% Physical Exam  Constitutional: He is oriented to person, place, and time. He appears well-developed and well-nourished.  HENT:  Head: Normocephalic and atraumatic.  Eyes: Conjunctivae are normal. Pupils are equal, round, and reactive to light.  Neck: Neck supple. No tracheal deviation present. No thyromegaly present.  Cardiovascular: Normal rate and regular rhythm.   No murmur heard. Pulmonary/Chest: Effort normal and breath sounds normal.  Abdominal: Soft. Bowel sounds are normal. He exhibits no distension. There is no tenderness.  Musculoskeletal: Normal range of motion. He exhibits no edema or tenderness.  Neurological: He is alert and oriented to person, place, and time. No cranial nerve deficit. Coordination normal.  Gait normal not lightheaded on standing. No tremors  Skin: Skin is warm and dry. No rash noted.  Psychiatric: He has a normal mood and affect.  Nursing note and vitals reviewed.   ED Course   Procedures (including critical care time) Labs Review Labs Reviewed  COMPREHENSIVE METABOLIC PANEL - Abnormal; Notable for the following:    Sodium 146 (*)    Chloride 96 (*)    CO2 36 (*)    Glucose, Bld 132 (*)    All other components within normal limits  CBC - Abnormal; Notable for the following:    RBC 2.80 (*)    Hemoglobin 9.5 (*)    HCT 31.1 (*)    MCV 111.1 (*)    Platelets 101 (*)    All other components within normal limits  URINALYSIS, ROUTINE W REFLEX MICROSCOPIC (NOT AT [facility]) - Abnormal; Notable for the following:    Hgb urine dipstick TRACE (*)    Leukocytes, UA SMALL (*)    All other components within normal limits  URINE MICROSCOPIC-ADD ON - Abnormal; Notable for the following:    Squamous Epithelial / LPF 0-5 (*)    Bacteria, UA RARE (*)    All other components within normal limits  CBG MONITORING, ED - Abnormal; Notable for the following:    Glucose-Capillary 150 (*)    All other components within normal limits  CBG MONITORING, ED    Imaging Review No results found. I have personally reviewed and evaluated these images and lab results as part of my medical decision-making.   EKG Interpretation None     7:30 PM patient feels well and ready to go home. Noninvasive venous studies of legs showed no evidence of DVT. Results for orders placed or performed during the hospital encounter of 05/03/15  Comprehensive metabolic panel  Result Value Ref Range   Sodium 146 (H) 135 - 145 mmol/L   Potassium 3.7 3.5 - 5.1 mmol/L   Chloride 96 (L) 101 - 111 mmol/L   CO2 36 (H) 22 - 32 mmol/L   Glucose, Bld 132 (H) 65 - 99 mg/dL   BUN 15 6 - 20 mg/dL   Creatinine, Ser 0.94 0.61 - 1.24 mg/dL   Calcium 9.7 8.9 - 10.3 mg/dL   Total Protein 6.7 6.5 - 8.1 g/dL   Albumin 3.5 3.5 - 5.0 g/dL   AST 28 15 - 41 U/L   ALT 27 17 - 63 U/L   Alkaline Phosphatase 80 38 - 126 U/L   Total Bilirubin 0.6  0.3 - 1.2 mg/dL   GFR calc non Af Amer >60 >60 mL/min   GFR calc Af Amer  >60 >60 mL/min   Anion gap 14 5 - 15  CBC  Result Value Ref Range   WBC 5.5 4.0 - 10.5 K/uL   RBC 2.80 (L) 4.22 - 5.81 MIL/uL   Hemoglobin 9.5 (L) 13.0 - 17.0 g/dL   HCT 31.1 (L) 39.0 - 52.0 %   MCV 111.1 (H) 78.0 - 100.0 fL   MCH 33.9 26.0 - 34.0 pg   MCHC 30.5 30.0 - 36.0 g/dL   RDW 15.1 11.5 - 15.5 %   Platelets 101 (L) 150 - 400 K/uL  Urinalysis, Routine w reflex microscopic (not at [facility])  Result Value Ref Range   Color, Urine YELLOW YELLOW   APPearance CLEAR CLEAR   Specific Gravity, Urine 1.009 1.005 - 1.030   pH 7.0 5.0 - 8.0   Glucose, UA NEGATIVE NEGATIVE mg/dL   Hgb urine dipstick TRACE (A) NEGATIVE   Bilirubin Urine NEGATIVE NEGATIVE   Ketones, ur NEGATIVE NEGATIVE mg/dL   Protein, ur NEGATIVE NEGATIVE mg/dL   Nitrite NEGATIVE NEGATIVE   Leukocytes, UA SMALL (A) NEGATIVE  Urine microscopic-add on  Result Value Ref Range   Squamous Epithelial / LPF 0-5 (A) NONE SEEN   WBC, UA 0-5 0 - 5 WBC/hpf   RBC / HPF 0-5 0 - 5 RBC/hpf   Bacteria, UA RARE (A) NONE SEEN  CBG monitoring, ED  Result Value Ref Range   Glucose-Capillary 150 (H) 65 - 99 mg/dL   Dg Chest 2 View  04/21/2015  CLINICAL DATA:  Dyspnea; history of COPD, acute lymphoid leukemia in remission, CHF EXAM: CHEST  2 VIEW COMPARISON:  Chest x-ray of February 23, 2015 FINDINGS: The lungs are mildly hypoinflated. There is no focal infiltrate. There are numerous surgical wire is encompassing the fifth and sixth ribs posterior laterally on the left. Several of these wires are broken but this is a stable finding. The heart is normal in size. The pulmonary vascularity is not engorged. The Port-A-Cath appliance tip projects over the midportion of the SVC. The observed thoracic spine is unremarkable. IMPRESSION: Mild hypo inflation, chronic. There is no active cardiopulmonary disease. Electronically Signed   By: David  [place]ique M.D.   On: 04/21/2015 11:42   Ct Head Wo Contrast  05/03/2015  CLINICAL DATA:  Fall yesterday.  Hallucinations. Worsening tremor comment incontinence, weakness, and confusion. Initial EXAM: CT HEAD WITHOUT CONTRAST TECHNIQUE: Contiguous axial images were obtained from the base of the skull through the vertex without intravenous contrast. COMPARISON:  05/01/2013 FINDINGS: There is no evidence of intracranial hemorrhage, brain edema, or other signs of acute infarction. There is no evidence of intracranial mass lesion or mass effect. No abnormal extraaxial fluid collections are identified. Mild cerebral atrophy appears stable. Ventricles are stable in size. No evidence of skull fracture or pneumocephalus. IMPRESSION: No acute intracranial abnormality.  Stable mild cerebral atrophy. Electronically Signed   By: Earle Gell M.D.   On: 05/03/2015 19:03   Ct Angio Chest Pe W/cm &/or Wo Cm  04/22/2015  CLINICAL DATA:  Shortness of breath and chest pain for 1 month. Previous history of DVT and pulmonary embolism. Personal history of colon carcinoma and acute lymphocytic leukemia. EXAM: CT ANGIOGRAPHY CHEST WITH CONTRAST TECHNIQUE: Multidetector CT imaging of the chest was performed using the standard protocol during bolus administration of intravenous contrast. Multiplanar CT image reconstructions and MIPs were obtained to evaluate the vascular  anatomy. CONTRAST:  69mL OMNIPAQUE IOHEXOL 350 MG/ML SOLN COMPARISON:  12/29/2014 FINDINGS: Mediastinum/Lymph Nodes: No pulmonary emboli or thoracic aortic dissection identified. No masses or pathologically enlarged lymph nodes identified. No evidence of pericardial effusion. Lungs/Pleura: No pulmonary mass, consolidation, or effusion. Upper abdomen: Small posterior right hepatic lobe cyst remains stable. Tiny less than 5 mm nonobstructive calculus in upper pole of left kidney also unchanged. Musculoskeletal: No chest wall mass or suspicious bone lesions identified. Left rib cerclage wires again seen. Review of the MIP images confirms the above findings. IMPRESSION: Stable  exam. No evidence of pulmonary embolism or other acute findings. Electronically Signed   By: Earle Gell M.D.   On: 04/22/2015 13:53    MDM  exibits no evidence of delerium  Final diagnoses:  None     patient's symptoms appear to be chronic. Anemia is chronic Plan. Patient instructed to follow up with PMD tomorrow. Diagnoses #1 generalized weakness #2 anemia   Orlie Dakin, MD 05/03/15 [phone]

## 2015-05-03 NOTE — ED Notes (Signed)
Patient transported to CT 

## 2015-05-03 NOTE — Progress Notes (Signed)
*  Preliminary Results* Bilateral lower extremity venous duplex completed. Bilateral lower extremities are negative for deep vein thrombosis. There is no evidence of Baker's cyst bilaterally.  05/03/2015  Fritz Cauthon, RVT, RDCS, RDMS  

## 2015-05-03 NOTE — Discharge Instructions (Signed)
Weakness No evidence of blood clot in your legs. CT scan of your brain showed no evidence of stroke. Lab work shows that you are anemic, , however blood count is unchanged for the past several months Contact your primary care physician tomorrow. He may want to see you in the office. Return if concerned for any reason. Weakness is a lack of strength. You may feel weak all over your body or just in one part of your body. Weakness can be serious. In some cases, you may need more medical tests. HOME [place] a well-balanced diet.  Try to exercise every day.  Only take medicines as told by your doctor. GET HELP RIGHT AWAY IF:   You cannot do your normal daily activities.  You cannot walk up and down stairs, or you feel very tired when you do so.  You have shortness of breath or chest pain.  You have trouble moving parts of your body.  You have weakness in only one body part or on only one side of the body.  You have a fever.  You have trouble speaking or swallowing.  You cannot control when you pee (urinate) or poop (bowel movement).  You have black or bloody throw up (vomit) or poop.  Your weakness gets worse or spreads to other body parts.  You have new aches or pains. MAKE SURE YOU:   Understand these instructions.  Will watch your condition.  Will get help right away if you are not doing well or get worse.   This information is not intended to replace advice given to you by your health care provider. Make sure you discuss any questions you have with your health care provider.   Document Released: 02/04/2008 Document Revised: 08/23/2011 Document Reviewed: 04/22/2011 Elsevier Interactive Patient Education [employer].

## 2015-05-03 NOTE — ED Notes (Signed)
Vascular tech at bedside. °

## 2015-05-03 NOTE — ED Notes (Addendum)
Family c/o 1 week history of increasingly worse tremor, incontinence, weakness and confusion. He tried to call his son on the tv remote last night. He lost his balance and almost fell this week but was able to land on the couch. He states he has felt tired this week. He c/o L foot pain that is chronic for him, no new pain. He is A&Ox4 now. He wears 3-5L continuous O2 at home.

## 2015-05-04 ENCOUNTER — Telehealth: Payer: Self-pay | Admitting: Family Medicine

## 2015-05-04 NOTE — Telephone Encounter (Signed)
Pt seen at [place] for weakness appt scheduled for ED follow up

## 2015-05-05 ENCOUNTER — Encounter: Payer: Self-pay | Admitting: Family Medicine

## 2015-05-05 ENCOUNTER — Ambulatory Visit (INDEPENDENT_AMBULATORY_CARE_PROVIDER_SITE_OTHER): Payer: Medicare HMO | Admitting: Family Medicine

## 2015-05-05 ENCOUNTER — Ambulatory Visit (INDEPENDENT_AMBULATORY_CARE_PROVIDER_SITE_OTHER): Payer: Medicare HMO

## 2015-05-05 VITALS — BP 111/66 | HR 78 | Temp 98.3°F | Ht 66.0 in | Wt 215.0 lb

## 2015-05-05 DIAGNOSIS — J432 Centrilobular emphysema: Secondary | ICD-10-CM

## 2015-05-05 DIAGNOSIS — M25572 Pain in left ankle and joints of left foot: Secondary | ICD-10-CM | POA: Diagnosis not present

## 2015-05-05 DIAGNOSIS — C9101 Acute lymphoblastic leukemia, in remission: Secondary | ICD-10-CM

## 2015-05-05 DIAGNOSIS — G894 Chronic pain syndrome: Secondary | ICD-10-CM | POA: Diagnosis not present

## 2015-05-05 MED ORDER — ESCITALOPRAM OXALATE 10 MG PO TABS: 10.0000 mg | ORAL_TABLET | Freq: Every day | ORAL | Status: DC

## 2015-05-05 MED ORDER — ANKLE LACE-UP BRACE MISC: Status: DC

## 2015-05-05 MED ORDER — ONETOUCH LANCETS MISC: 1.0000 | Freq: Every day | Status: DC

## 2015-05-05 NOTE — Patient Instructions (Signed)
FOR CONSTIPATION: Use linzess 145 mcg every day. IF you go two days without a bowel movement, start using the polyethylene glycol, one capful twice daily until bowels start moving again.

## 2015-05-05 NOTE — Progress Notes (Signed)
Subjective:  Patient ID: Derek Shepherd, male    DOB: 05/11/1955  Age: 60 y.o. MRN: [id number]  CC: Hospitalization Follow-up   HPI [name] presents for [facility] to E.D. Two days ago for fatigue. Has appt upcoming in a few days with his oncologist related to his recent bone marrow transplant. He says he has no energy he feels depressed. Wife is concerned about depression as well. Patient is using oxygen 3 L at rest and 5 L when active but is not active very much except to walk around the house. He says he continues to have tremors. ED report says that they did not recognize a tremor. Patient was concerned that that meant they didn't do a very good job. He was recently switched from albuterol to Xopenex but does not have the proper dose he went for the nebs yet. There is some concern that the fentanyl may be contributing to his tremor. Currently he denies shortness of breath at rest. He continues to have problems with constipation. He's having trouble determining if he should take the Linzess every day or not and which strength apparently he has some of the 290s which gives him diarrhea. He also doesn't know when or if to take the lactulose and the MiraLAX.  Depression screen [facility] 2/9 05/05/2015 03/03/2015 02/23/2015 12/22/2014 12/18/2014  Decreased Interest 3 0 0 0 2  Down, Depressed, Hopeless 1 0 0 0 2  PHQ - 2 Score 4 0 0 0 4  Altered sleeping 0 - - - 2  Tired, decreased energy 3 - - - 3  Change in appetite 3 - - - 0  Feeling bad or failure about yourself  0 - - - 1  Trouble concentrating 3 - - - 0  Moving slowly or fidgety/restless 3 - - - 3  Suicidal thoughts 0 - - - 1  PHQ-9 Score 16 - - - 14  Difficult doing work/chores Somewhat difficult - - - -   GAD 7 : Generalized Anxiety Score 05/05/2015  Nervous, Anxious, on Edge 3  Control/stop worrying 3  Worry too much - different things 3  Trouble relaxing 3  Restless 0  Easily annoyed or irritable 0  Afraid - awful might happen 0    Total GAD 7 Score 12  Anxiety Difficulty Somewhat difficult         History Levi has a past medical history of Leukemia-lymphoma, T-cell, acute, HTLV-I-associated ([place]); COPD (chronic obstructive pulmonary disease) ([place]); DVT (deep venous thrombosis) ([place]); Personal history of colonic  adenoma (01/22/2008); Anemia; Anxiety; Arthritis; Depression; Gallstones; Bowel obstruction ([place]); Adenomatous colon polyp; Diverticulosis; CHF (congestive heart failure) (HCC); GERD (gastroesophageal reflux disease); and 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   He has past surgical history that includes Cholecystectomy; Lung surgery (Left); Exploratory laparotomy; Hernia repair; Colonoscopy w/ biopsies; and Bone marrow transplant (2011).   His family history includes Clotting disorder in his mother; Diabetes in his brother, father, maternal aunt, and sister; Heart attack in his father and mother; Prostate cancer in his brother.He reports that he quit smoking about 21 years ago. His smoking use included Cigarettes. He has never used smokeless tobacco. He reports that he drinks alcohol. He reports that he does not use illicit drugs.    ROS Review of Systems  Constitutional: Negative for fever, chills, diaphoresis and unexpected weight change.  HENT: Negative for congestion, hearing loss, rhinorrhea and sore throat.   Eyes: Negative for visual disturbance.  Respiratory: Negative  for cough and shortness of breath.   Cardiovascular: Negative for chest pain.  Gastrointestinal: Positive for constipation and abdominal distention. Negative for abdominal pain and diarrhea.  Genitourinary: Negative for dysuria and flank pain.  Musculoskeletal: Positive for arthralgias (fell several days ago and twisted the left ankle. It is moderately painful for ambulation in spite of the pain medications he takes chronically.). Negative for joint swelling.  Skin: Negative for rash.   Neurological: Negative for dizziness and headaches.  Psychiatric/Behavioral: Positive for dysphoric mood and decreased concentration. Negative for suicidal ideas and sleep disturbance. The patient is nervous/anxious.     Objective:  BP 111/66 mmHg  Pulse 78  Temp(Src) 98.3 F (36.8 C) (Oral)  Ht 5\' 6"  (1.676 m)  Wt 215 lb (97.523 kg)  BMI 34.72 kg/m2  SpO2 96%  BP Readings from Last 3 Encounters:  05/05/15 111/66  05/03/15 115/72  04/30/15 97/56    Wt Readings from Last 3 Encounters:  05/05/15 215 lb (97.523 kg)  04/30/15 216 lb 6.4 oz (98.158 kg)  04/22/15 217 lb (98.431 kg)     Physical Exam  Constitutional: He is oriented to person, place, and time. He appears well-developed and well-nourished. No distress.  HENT:  Head: Normocephalic and atraumatic.  Right Ear: External ear normal.  Left Ear: External ear normal.  Nose: Nose normal.  Mouth/Throat: Oropharynx is clear and moist.  Eyes: Conjunctivae and EOM are normal. Pupils are equal, round, and reactive to light.  Neck: Normal range of motion. Neck supple. No thyromegaly present.  Cardiovascular: Normal rate, regular rhythm and normal heart sounds.   No murmur heard. Pulmonary/Chest: Effort normal and breath sounds normal. No respiratory distress. He has no wheezes. He has no rales.  Wearing O2, nasal cannula at 3 liters   Abdominal: Soft. Bowel sounds are normal. He exhibits distension. There is no tenderness.  Musculoskeletal:       Left ankle: Normal.  Lymphadenopathy:    He has no cervical adenopathy.  Neurological: He is alert and oriented to person, place, and time. He has normal reflexes.  Skin: Skin is warm and dry.  Psychiatric: His speech is delayed and tangential. He is slowed and withdrawn. He is not agitated. Thought content is not delusional. Cognition and memory are impaired.     Lab Results  Component Value Date   WBC 5.5 05/03/2015   HGB 9.5* 05/03/2015   HCT 31.1* 05/03/2015   PLT  101* 05/03/2015   GLUCOSE 132* 05/03/2015   CHOL 214* 12/01/2014   TRIG 290* 12/01/2014   HDL 48 12/01/2014   LDLCALC 108* 12/01/2014   ALT 27 05/03/2015   AST 28 05/03/2015   NA 146* 05/03/2015   K 3.7 05/03/2015   CL 96* 05/03/2015   CREATININE 0.94 05/03/2015   BUN 15 05/03/2015   CO2 36* 05/03/2015   PSA 1.0 02/15/2013   INR 1.1* 07/14/2014   HGBA1C 5.6 12/27/2014    Ct Head Wo Contrast  05/03/2015  CLINICAL DATA:  Fall yesterday. Hallucinations. Worsening tremor comment incontinence, weakness, and confusion. Initial EXAM: CT HEAD WITHOUT CONTRAST TECHNIQUE: Contiguous axial images were obtained from the base of the skull through the vertex without intravenous contrast. COMPARISON:  05/01/2013 FINDINGS: There is no evidence of intracranial hemorrhage, brain edema, or other signs of acute infarction. There is no evidence of intracranial mass lesion or mass effect. No abnormal extraaxial fluid collections are identified. Mild cerebral atrophy appears stable. Ventricles are stable in size. No evidence of skull fracture  or pneumocephalus. IMPRESSION: No acute intracranial abnormality.  Stable mild cerebral atrophy. Electronically Signed   By: Earle Gell M.D.   On: 05/03/2015 19:03    Assessment & Plan:   Osualdo was seen today for hospitalization follow-up.  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 -     Elastic Bandages & Supports (ANKLE LACE-UP BRACE) MISC; Wear daily for two weeks then as needed ASO  Style brace -     escitalopram (LEXAPRO) 10 MG tablet; Take 1 tablet (10 mg total) by mouth daily. For depression    I have discontinued Mr. Bachmeier's citalopram and onetouch ultrasoft. I am also having him start on  ONE TOUCH LANCETS, Ankle Lace-Up Brace, and escitalopram. Additionally, I am having him maintain his fentaNYL, Multiple Vitamins, Polyethyl Glycol-Propyl Glycol, folic acid, vitamin 0000000, albuterol, Magnesium Chloride-Calcium, OXYGEN, budesonide-formoterol, SPIRIVA HANDIHALER, eucerin, polyethylene glycol powder, rOPINIRole, ALPRAZolam, promethazine, potassium chloride, torsemide, pantoprazole, Sirolimus, montelukast, fluconazole, Probiotic Product (TRUBIOTICS PO), Linaclotide, ONE TOUCH ULTRA TEST, traZODone, cyclobenzaprine, lactulose, levalbuterol, sulfamethoxazole-trimethoprim, and NUCYNTA.  Meds ordered this encounter  Medications  . ONE TOUCH LANCETS MISC    Sig: 1 each by Does not apply route daily.    Dispense:  100 each    Refill:  11  . Elastic Bandages & Supports (ANKLE LACE-UP BRACE) MISC    Sig: Wear daily for two weeks then as needed ASO  Style brace    Dispense:  1 each    Refill:  0  . escitalopram (LEXAPRO) 10 MG tablet    Sig: Take 1 tablet (10 mg total) by mouth daily. For depression    Dispense:  30 tablet    Refill:  2   FOR CONSTIPATION: Use linzess 145 mcg every day. IF you go two days without a bowel movement, start using the polyethylene glycol, one capful twice daily until bowels start moving again.  Follow-up: Return in about 3 months (around 08/02/2015), or if symptoms worsen or fail to improve.  Claretta Fraise, M.D.

## 2015-05-11 ENCOUNTER — Telehealth: Payer: Self-pay | Admitting: Family Medicine

## 2015-05-11 NOTE — Telephone Encounter (Signed)
Patient's wife wants him to be seen and evaluated.  She said he has had 2 recent falls, weight loss, and elevated blood sugar, dizziness at times.  Appointment made for 05/12/15 at 9:10 am.

## 2015-05-12 ENCOUNTER — Ambulatory Visit (INDEPENDENT_AMBULATORY_CARE_PROVIDER_SITE_OTHER): Payer: Medicare HMO | Admitting: Family Medicine

## 2015-05-12 ENCOUNTER — Encounter: Payer: Self-pay | Admitting: Family Medicine

## 2015-05-12 VITALS — BP 119/68 | HR 81 | Ht 66.0 in | Wt 218.4 lb

## 2015-05-12 DIAGNOSIS — Z79899 Other long term (current) drug therapy: Secondary | ICD-10-CM

## 2015-05-12 DIAGNOSIS — I509 Heart failure, unspecified: Secondary | ICD-10-CM

## 2015-05-12 DIAGNOSIS — R259 Unspecified abnormal involuntary movements: Secondary | ICD-10-CM | POA: Diagnosis not present

## 2015-05-12 DIAGNOSIS — R7303 Prediabetes: Secondary | ICD-10-CM | POA: Diagnosis not present

## 2015-05-12 DIAGNOSIS — J432 Centrilobular emphysema: Secondary | ICD-10-CM

## 2015-05-12 DIAGNOSIS — G894 Chronic pain syndrome: Secondary | ICD-10-CM | POA: Diagnosis not present

## 2015-05-12 DIAGNOSIS — R296 Repeated falls: Secondary | ICD-10-CM | POA: Diagnosis not present

## 2015-05-12 MED ORDER — TORSEMIDE 100 MG PO TABS: 200.0000 mg | ORAL_TABLET | Freq: Every day | ORAL | Status: DC

## 2015-05-12 NOTE — Patient Instructions (Signed)
Please call your pain doctor today and arrange a consultation with him to formulate a plan to taper off of her opiate-containing pain medicines.

## 2015-05-12 NOTE — Progress Notes (Signed)
Subjective:  Patient ID: Derek Shepherd, male    DOB: 1955/12/10  Age: 60 y.o. MRN: [id number]  CC: Hyperglycemia   HPI Derek Shepherd presents for glucose went as high as 160. Patient not sure if that's a problem or not. He denies any hypoglycemia. He states that he continues to have the shakiness but that's not related to whether the sugars higher not. He feels bad most of the time he stated short of breath. his pain is not well relieved. He has very little energy. He had another fall a couple days ago he fell backwards but he did not hit his head and he did not lose consciousness. He just felt weak and fell. Nothing gave way. There was moderate lightheaded form of dizziness.   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   He has past surgical history that includes Cholecystectomy; Lung surgery (Left); Exploratory laparotomy; Hernia repair; Colonoscopy w/ biopsies; and Bone marrow transplant (2011).   His family history includes Clotting disorder in his mother; Diabetes in his brother, father, maternal aunt, and sister; Heart attack in his father and mother; Prostate cancer in his brother.He reports that he quit smoking about 21 years ago. His smoking use included Cigarettes. He has never used smokeless tobacco. He reports that he drinks alcohol. He reports that he does not use illicit drugs.    ROS Review of Systems  Constitutional: Negative for fever, chills, diaphoresis and unexpected weight change.  HENT:  Negative for congestion, hearing loss, rhinorrhea and sore throat.   Eyes: Negative for visual disturbance.  Respiratory: Negative for cough and shortness of breath.   Cardiovascular: Negative for chest pain.  Gastrointestinal: Negative for abdominal pain, diarrhea and constipation.  Genitourinary: Negative for dysuria and flank pain.  Musculoskeletal: Negative for joint swelling and arthralgias.  Skin: Negative for rash.  Neurological: Positive for tremors, weakness and headaches. Negative for dizziness and light-headedness.  Psychiatric/Behavioral: Negative for sleep disturbance and dysphoric mood.    Objective:  BP 119/68 mmHg  Pulse 81  Ht 5' 6"  (1.676 m)  Wt 218 lb 6 oz (99.054 kg)  BMI 35.26 kg/m2  SpO2 95%  BP Readings from Last 3 Encounters:  05/12/15 119/68  05/05/15 111/66  05/03/15 115/72    Wt Readings from Last 3 Encounters:  05/12/15 218 lb 6 oz (99.054 kg)  05/05/15 215 lb (97.523 kg)  04/30/15 216 lb 6.4 oz (98.158 kg)     Physical Exam  Constitutional: He is oriented to person, place, and time. He appears well-developed and well-nourished. No distress.  HENT:  Head: Normocephalic and atraumatic.  Right Ear: External ear normal.  Left Ear: External ear normal.  Nose: Nose normal.  Mouth/Throat: Oropharynx is clear and moist.  Eyes: Conjunctivae and EOM are normal. Pupils are equal, round, and reactive to light.  Neck: Normal range of motion. Neck supple. No thyromegaly present.  Cardiovascular: Normal rate, regular rhythm and normal heart sounds.   No murmur heard. Pulmonary/Chest: Effort normal and breath sounds normal. No respiratory distress. He has no wheezes. He has no rales.  Abdominal: Soft. Bowel sounds are normal. He exhibits no distension. There is no tenderness.  Lymphadenopathy:    He has no cervical adenopathy.  Neurological: He is alert and oriented to person, place, and time. He has normal reflexes.  Skin: Skin is warm and dry.    Psychiatric: He has a normal mood and affect. His behavior is normal. Judgment and thought content normal.     Lab Results  Component Value Date   WBC 5.5 05/03/2015   HGB 9.5* 05/03/2015   HCT 31.1* 05/03/2015   PLT 101* 05/03/2015   GLUCOSE 132* 05/03/2015   CHOL 214* 12/01/2014   TRIG 290* 12/01/2014   HDL 48 12/01/2014   LDLCALC 108* 12/01/2014   ALT 27 05/03/2015   AST 28 05/03/2015   NA 146* 05/03/2015   K 3.7 05/03/2015   CL 96* 05/03/2015   CREATININE 0.94 05/03/2015   BUN 15 05/03/2015   CO2 36* 05/03/2015   PSA 1.0 02/15/2013   INR 1.1* 07/14/2014   HGBA1C 5.6 12/27/2014    Ct Head Wo Contrast  05/03/2015  CLINICAL DATA:  Fall yesterday. Hallucinations. Worsening tremor comment incontinence, weakness, and confusion. Initial EXAM: CT HEAD WITHOUT CONTRAST TECHNIQUE: Contiguous axial images were obtained from the base of the skull through the vertex without intravenous contrast. COMPARISON:  05/01/2013 FINDINGS: There is no evidence of intracranial hemorrhage, brain edema, or other signs of acute infarction. There is no evidence of intracranial mass lesion or mass effect. No abnormal extraaxial fluid collections are identified. Mild cerebral atrophy appears stable. Ventricles are stable in size. No evidence of skull fracture or pneumocephalus. IMPRESSION: No acute intracranial abnormality.  Stable mild cerebral atrophy. Electronically Signed   By: Earle Gell M.D.   On: 05/03/2015 19:03    Assessment & Plan:   Derek Shepherd was seen today for hyperglycemia.  Diagnoses and all orders for this visit:  Abnormal involuntary movements -     BMP8+EGFR  Chronic congestive heart failure, unspecified congestive heart failure type (HCC) -     BMP8+EGFR  Chronic pain syndrome -     BMP8+EGFR  Centrilobular emphysema (HCC) -     BMP8+EGFR  Frequent falls -     BMP8+EGFR  Pre-diabetes -     BMP8+EGFR  Polypharmacy -     BMP8+EGFR  Other orders -     torsemide  (DEMADEX) 100 MG tablet; Take 2 tablets (200 mg total) by mouth daily.    30 minutes was spent over half of this in consultation regarding his overall symptomatology and the contribution of opiates to his ongoing pain/tremor/dyspnea/dizziness syndrome. He is advised to see his pain doctor about tapering off of his pain meds as quickly as it is prudent.  I have changed Mr. Kaster's torsemide. I am also having him maintain his fentaNYL, Multiple Vitamins, Polyethyl Glycol-Propyl Glycol, folic acid, vitamin I-50, albuterol, Magnesium Chloride-Calcium, OXYGEN, budesonide-formoterol, SPIRIVA HANDIHALER, eucerin, polyethylene glycol powder, rOPINIRole, ALPRAZolam, promethazine, potassium chloride, pantoprazole, Sirolimus, montelukast, fluconazole, Probiotic Product (TRUBIOTICS PO), Linaclotide, ONE TOUCH ULTRA TEST, traZODone, cyclobenzaprine, lactulose, levalbuterol, sulfamethoxazole-trimethoprim, NUCYNTA, ONE TOUCH LANCETS, Ankle Lace-Up Brace, and escitalopram.  Meds ordered this encounter  Medications  . torsemide (DEMADEX) 100 MG tablet    Sig: Take 2 tablets (200 mg total) by mouth daily.    Dispense:  180 tablet    Refill:  3     Follow-up: Return in about 1 month (around 06/12/2015).  Claretta Fraise, M.D.

## 2015-05-13 ENCOUNTER — Other Ambulatory Visit: Payer: Self-pay | Admitting: Family Medicine

## 2015-05-13 LAB — BMP8+EGFR
BUN / CREAT RATIO: 13 (ref 9–20)
BUN: 11 mg/dL (ref 6–24)
CALCIUM: 9.6 mg/dL (ref 8.7–10.2)
CHLORIDE: 92 mmol/L — AB (ref 96–106)
CO2: 35 mmol/L — ABNORMAL HIGH (ref 18–29)
Creatinine, Ser: 0.85 mg/dL (ref 0.76–1.27)
GFR calc Af Amer: 112 mL/min/{1.73_m2} (ref >59)
GFR calc non Af Amer: 97 mL/min/{1.73_m2} (ref >59)
GLUCOSE: 156 mg/dL — AB (ref 65–99)
Potassium: 3.5 mmol/L (ref 3.5–5.2)
Sodium: 143 mmol/L (ref 134–144)

## 2015-05-13 MED ORDER — AMILORIDE HCL 5 MG PO TABS: 5.0000 mg | ORAL_TABLET | Freq: Every day | ORAL | Status: DC

## 2015-05-14 ENCOUNTER — Telehealth: Payer: Self-pay | Admitting: Family Medicine

## 2015-05-14 NOTE — Telephone Encounter (Signed)
Pt to check back with [facility], we show it went electronically yesterday morning. Instructed to have [facility] call us if they still do not have it today.

## 2015-05-14 NOTE — Telephone Encounter (Signed)
Done 05/05/15

## 2015-05-18 ENCOUNTER — Telehealth: Payer: Self-pay | Admitting: Family Medicine

## 2015-05-18 ENCOUNTER — Other Ambulatory Visit: Payer: Self-pay | Admitting: Family Medicine

## 2015-05-18 MED ORDER — LINACLOTIDE 145 MCG PO CAPS: 145.0000 ug | ORAL_CAPSULE | Freq: Every day | ORAL | Status: DC

## 2015-05-18 NOTE — Telephone Encounter (Signed)
Samples left °

## 2015-05-20 ENCOUNTER — Other Ambulatory Visit: Payer: Self-pay | Admitting: Internal Medicine

## 2015-05-20 ENCOUNTER — Other Ambulatory Visit: Payer: Self-pay | Admitting: Family Medicine

## 2015-05-22 ENCOUNTER — Other Ambulatory Visit: Payer: Self-pay | Admitting: Family Medicine

## 2015-05-22 NOTE — Telephone Encounter (Signed)
Patient states that he is having a hard time getting blood to come out of finger to test blood sugar. Patient advised to warm his hands before trying to get BS and let us know of this does not help and we will get him a new meter.

## 2015-05-28 ENCOUNTER — Telehealth: Payer: Self-pay | Admitting: Pharmacist

## 2015-05-28 MED ORDER — LINACLOTIDE 72 MCG PO CAPS: 1.0000 | ORAL_CAPSULE | Freq: Every day | ORAL | Status: DC | PRN

## 2015-05-28 NOTE — Telephone Encounter (Signed)
Patient asks if he can decrease to lowest dose of Linzess - 72mg  .  Will try lowest dose.  Rx sent to pharmacy

## 2015-06-01 ENCOUNTER — Encounter (HOSPITAL_COMMUNITY): Payer: Self-pay | Admitting: Emergency Medicine

## 2015-06-01 ENCOUNTER — Emergency Department (HOSPITAL_COMMUNITY): Payer: Medicare HMO

## 2015-06-01 DIAGNOSIS — Y990 Civilian activity done for income or pay: Secondary | ICD-10-CM | POA: Diagnosis not present

## 2015-06-01 DIAGNOSIS — I509 Heart failure, unspecified: Secondary | ICD-10-CM | POA: Insufficient documentation

## 2015-06-01 DIAGNOSIS — F329 Major depressive disorder, single episode, unspecified: Secondary | ICD-10-CM | POA: Diagnosis not present

## 2015-06-01 DIAGNOSIS — S8991XA Unspecified injury of right lower leg, initial encounter: Secondary | ICD-10-CM | POA: Diagnosis present

## 2015-06-01 DIAGNOSIS — S8011XA Contusion of right lower leg, initial encounter: Secondary | ICD-10-CM | POA: Diagnosis not present

## 2015-06-01 DIAGNOSIS — Z79899 Other long term (current) drug therapy: Secondary | ICD-10-CM | POA: Insufficient documentation

## 2015-06-01 DIAGNOSIS — Y9389 Activity, other specified: Secondary | ICD-10-CM | POA: Insufficient documentation

## 2015-06-01 DIAGNOSIS — J449 Chronic obstructive pulmonary disease, unspecified: Secondary | ICD-10-CM | POA: Diagnosis not present

## 2015-06-01 DIAGNOSIS — Y9269 Other specified industrial and construction area as the place of occurrence of the external cause: Secondary | ICD-10-CM | POA: Diagnosis not present

## 2015-06-01 DIAGNOSIS — M25562 Pain in left knee: Secondary | ICD-10-CM | POA: Diagnosis not present

## 2015-06-01 DIAGNOSIS — M1990 Unspecified osteoarthritis, unspecified site: Secondary | ICD-10-CM | POA: Diagnosis not present

## 2015-06-01 DIAGNOSIS — W0110XA Fall on same level from slipping, tripping and stumbling with subsequent striking against unspecified object, initial encounter: Secondary | ICD-10-CM | POA: Diagnosis not present

## 2015-06-01 DIAGNOSIS — Z87891 Personal history of nicotine dependence: Secondary | ICD-10-CM | POA: Diagnosis not present

## 2015-06-01 DIAGNOSIS — D696 Thrombocytopenia, unspecified: Secondary | ICD-10-CM | POA: Diagnosis not present

## 2015-06-01 NOTE — ED Notes (Signed)
Pt working out in yard, fell, thinks her tripped over 02 tubing. Pt has large hematoma to right leg, hit head, left knee, pt uses 3L 02 continuous

## 2015-06-02 ENCOUNTER — Emergency Department (HOSPITAL_COMMUNITY)
Admission: EM | Admit: 2015-06-02 | Discharge: 2015-06-02 | Disposition: A | Discharge status: Home / Self Care | Payer: Medicare HMO | Attending: Emergency Medicine | Admitting: Emergency Medicine

## 2015-06-02 ENCOUNTER — Other Ambulatory Visit: Payer: Self-pay | Admitting: Family Medicine

## 2015-06-02 DIAGNOSIS — D696 Thrombocytopenia, unspecified: Secondary | ICD-10-CM

## 2015-06-02 DIAGNOSIS — S8011XA Contusion of right lower leg, initial encounter: Secondary | ICD-10-CM

## 2015-06-02 DIAGNOSIS — W19XXXA Unspecified fall, initial encounter: Secondary | ICD-10-CM

## 2015-06-02 LAB — CBC WITH DIFFERENTIAL/PLATELET
BASOS ABS: 0 10*3/uL (ref 0.0–0.1)
BASOS PCT: 0 %
EOS ABS: 0.1 10*3/uL (ref 0.0–0.7)
EOS PCT: 2 %
HCT: 28.5 % — ABNORMAL LOW (ref 39.0–52.0)
Hemoglobin: 9.4 g/dL — ABNORMAL LOW (ref 13.0–17.0)
Lymphocytes Relative: 24 %
Lymphs Abs: 1.7 10*3/uL (ref 0.7–4.0)
MCH: 35.9 pg — AB (ref 26.0–34.0)
MCHC: 33 g/dL (ref 30.0–36.0)
MCV: 108.8 fL — AB (ref 78.0–100.0)
MONO ABS: 0.8 10*3/uL (ref 0.1–1.0)
MONOS PCT: 11 %
Neutro Abs: 4.3 10*3/uL (ref 1.7–7.7)
Neutrophils Relative %: 63 %
PLATELETS: 112 10*3/uL — AB (ref 150–400)
RBC: 2.62 MIL/uL — ABNORMAL LOW (ref 4.22–5.81)
RDW: 15.7 % — ABNORMAL HIGH (ref 11.5–15.5)
SMEAR REVIEW: DECREASED
WBC: 6.9 10*3/uL (ref 4.0–10.5)

## 2015-06-02 LAB — PROTIME-INR
INR: 1 (ref 0.00–1.49)
PROTHROMBIN TIME: 13.4 s (ref 11.6–15.2)

## 2015-06-02 MED ORDER — OXYCODONE-ACETAMINOPHEN 5-325 MG PO TABS
1.0000 | ORAL_TABLET | Freq: Once | ORAL | Status: AC
  Administered 2015-06-02: 1 via ORAL
  Filled 2015-06-02: qty 1

## 2015-06-02 MED ORDER — OXYCODONE-ACETAMINOPHEN 5-325 MG PO TABS: 1.0000 | ORAL_TABLET | Freq: Four times a day (QID) | ORAL | Status: DC | PRN

## 2015-06-02 NOTE — Telephone Encounter (Signed)
rx called into [facility] per pt request Okayed per Dr Livia Snellen

## 2015-06-02 NOTE — Discharge Instructions (Signed)
You were seen today and found to have a hematoma. Your platelets are 112 which is likely why you bled so much. Compression with an Ace wrap and ice. Extremity elevation. Pain management. Follow-up with her primary physician in 2 days for wound recheck.  Hematoma A hematoma is a collection of blood under the skin, in an organ, in a body space, in a joint space, or in other tissue. The blood can clot to form a lump that you can see and feel. The lump is often firm and may sometimes become sore and tender. Most hematomas get better in a few days to weeks. However, some hematomas may be serious and require medical care. Hematomas can range in size from very small to very large. CAUSES  A hematoma can be caused by a blunt or penetrating injury. It can also be caused by spontaneous leakage from a blood vessel under the skin. Spontaneous leakage from a blood vessel is more likely to occur in older people, especially those taking blood thinners. Sometimes, a hematoma can develop after certain medical procedures. SIGNS AND SYMPTOMS   A firm lump on the body.  Possible pain and tenderness in the area.  Bruising.Blue, dark blue, purple-red, or yellowish skin may appear at the site of the hematoma if the hematoma is close to the surface of the skin. For hematomas in deeper tissues or body spaces, the signs and symptoms may be subtle. For example, an intra-abdominal hematoma may cause abdominal pain, weakness, fainting, and shortness of breath. An intracranial hematoma may cause a headache or symptoms such as weakness, trouble speaking, or a change in consciousness. DIAGNOSIS  A hematoma can usually be diagnosed based on your medical history and a physical exam. Imaging tests may be needed if your health care provider suspects a hematoma in deeper tissues or body spaces, such as the abdomen, head, or chest. These tests may include ultrasonography or a CT scan.  TREATMENT  Hematomas usually go away on their own  over time. Rarely does the blood need to be drained out of the body. Large hematomas or those that may affect vital organs will sometimes need surgical drainage or monitoring. HOME CARE INSTRUCTIONS   Apply ice to the injured area:   Put ice in a plastic bag.   Place a towel between your skin and the bag.   Leave the ice on for 20 minutes, 2-3 times a day for the first 1 to 2 days.   After the first 2 days, switch to using warm compresses on the hematoma.   Elevate the injured area to help decrease pain and swelling. Wrapping the area with an elastic bandage may also be helpful. Compression helps to reduce swelling and promotes shrinking of the hematoma. Make sure the bandage is not wrapped too tight.   If your hematoma is on a lower extremity and is painful, crutches may be helpful for a couple days.   Only take over-the-counter or prescription medicines as directed by your health care provider. SEEK IMMEDIATE MEDICAL CARE IF:   You have increasing pain, or your pain is not controlled with medicine.   You have a fever.   You have worsening swelling or discoloration.   Your skin over the hematoma breaks or starts bleeding.   Your hematoma is in your chest or abdomen and you have weakness, shortness of breath, or a change in consciousness.  Your hematoma is on your scalp (caused by a fall or injury) and you have a worsening  headache or a change in alertness or consciousness. MAKE SURE YOU:   Understand these instructions.  Will watch your condition.  Will get help right away if you are not doing well or get worse.   This information is not intended to replace advice given to you by your health care provider. Make sure you discuss any questions you have with your health care provider.   Document Released: 10/06/2003 Document Revised: 10/24/2012 Document Reviewed: 08/01/2012 Elsevier Interactive Patient Education [employer].

## 2015-06-02 NOTE — Telephone Encounter (Signed)
Last seen 05/12/15 Dr Livia Snellen  If approved route to nurse to call into [facility]

## 2015-06-02 NOTE — Telephone Encounter (Signed)
Please review and advise.

## 2015-06-02 NOTE — ED Provider Notes (Signed)
CSN: [id number]     Arrival date & time 06/01/15  2038 History   First MD Initiated Contact with Patient 06/02/15 0129     Chief Complaint  Patient presents with  . Fall     (Consider location/radiation/quality/duration/timing/severity/associated sxs/prior Treatment) HPI  This is a 60 year old male with history of leukemia, DVT, COPD on home oxygen who presents following a fall. Patient states that he tripped over his oxygen tubing. He forcefully hit his right shin on a height. He also hit his left knee. He did hit his head but did not have loss of consciousness.  He has not had any vomiting or headaches since. He is complaining of 10 out of 10 pain to the right leg. He states that it is worse with ambulation. He is not currently on any anticoagulants or aspirin. Denies chest pain, shortness of breath, dizziness, syncope.  Past Medical History  Diagnosis Date  . Leukemia-lymphoma, T-cell, acute, HTLV-I-associated ([place])   . COPD (chronic obstructive pulmonary disease) ([place])   . DVT (deep venous thrombosis) ([place])   . Personal history of colonic  adenoma 01/22/2008  . Anemia   . Anxiety   . Arthritis   . Depression   . Gallstones   . Bowel obstruction ([facility])     constipation  . Adenomatous colon polyp     tubular  . Diverticulosis   . CHF (congestive heart failure) ([place])   . GERD (gastroesophageal reflux disease)   . 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 Cholecystectomy    . Lung surgery Left   . Exploratory laparotomy    . Hernia repair      x 2  . Colonoscopy w/ biopsies    . Bone marrow transplant  2011   Family History  Problem Relation Age of Onset  . Prostate cancer Brother   . Clotting disorder Mother   . Diabetes Father   . Diabetes Brother     x 3  . Diabetes Sister     x 3  . Diabetes Maternal Aunt   . Heart attack Mother   . Heart attack Father    Social  History  Substance Use Topics  . Smoking status: Former Smoker    Types: Cigarettes    Quit date: 07/13/1993  . Smokeless tobacco: Never Used  . Alcohol Use: 0.0 oz/week    0 Standard drinks or equivalent per week     Comment: socially    Review of Systems  Respiratory: Negative for shortness of breath.   Cardiovascular: Negative for chest pain.  Gastrointestinal: Negative for nausea and vomiting.  Musculoskeletal:       Right leg pain, left knee pain  Skin: Positive for wound.  Neurological: Negative for dizziness and syncope.  All other systems reviewed and are negative.     Allergies  Nsaids  Home Medications   Prior to Admission medications   Medication Sig Start Date End Date Taking? Authorizing Provider  albuterol (PROVENTIL) (2.5 MG/3ML) 0.083% nebulizer solution Take 3 mLs (2.5 mg total) by nebulization every 6 (six) hours as needed for wheezing or shortness of breath. 05/24/14   Claretta Fraise, MD  ALPRAZolam Duanne Moron) 0.25 MG tablet Take 0.5 tablets (0.125 mg total) by mouth at bedtime. Patient taking differently: Take 0.25 mg by mouth 2 (two) times daily as needed for anxiety (for tremors).  02/13/15   Claretta Fraise, MD  aMILoride (MIDAMOR) 5 MG  tablet Take 1 tablet (5 mg total) by mouth daily. 05/13/15   Claretta Fraise, MD  budesonide-formoterol [facility]) 160-4.5 MCG/ACT inhaler Inhale 2 puffs into the lungs every morning. 08/18/14   Claretta Fraise, MD  cyclobenzaprine (FLEXERIL) 10 MG tablet TAKE ONE TABLET BY MOUTH 3 TIMES A DAY AS NEEDED 05/20/15   Claretta Fraise, MD  Elastic Bandages & Supports (ANKLE LACE-UP BRACE) MISC Wear daily for two weeks then as needed ASO  Style brace 05/05/15   Claretta Fraise, MD  escitalopram (LEXAPRO) 10 MG tablet Take 1 tablet (10 mg total) by mouth daily. For depression 05/05/15   Claretta Fraise, MD  fentaNYL (DURAGESIC - DOSED MCG/HR) 25 MCG/HR patch Place 25 mcg onto the skin every other day.  01/22/13   Historical Provider, MD  fluconazole  (DIFLUCAN) 200 MG tablet Take 200 mg by mouth daily.    Historical Provider, MD  folic acid (FOLVITE) 1 MG tablet Take 1 mg by mouth daily.  02/11/14   Historical Provider, MD  lactulose (CHRONULAC) 10 GM/15ML solution Take 30 mLs (20 g total) by mouth 2 (two) times daily. For constipation 04/21/15   Claretta Fraise, MD  levalbuterol Penne Lash) 1.25 MG/0.5ML nebulizer solution Take 1.25 mg by nebulization 4 (four) times daily. 04/21/15   Claretta Fraise, MD  Linaclotide Rolan Lipa) 72 MCG CAPS Take 1 tablet by mouth daily as needed (for constipation). 05/28/15   Claretta Fraise, MD  Magnesium Chloride-Calcium 64-106 MG TBEC Take 2 tablets by mouth daily.     Historical Provider, MD  montelukast (SINGULAIR) 10 MG tablet Take 10 mg by mouth daily. 03/23/15   Historical Provider, MD  Multiple Vitamins tablet Take 1 tablet by mouth daily.     Historical Provider, MD  NUCYNTA 50 MG TABS tablet Take 50 mg by mouth daily as needed for moderate pain.  04/22/15   Historical Provider, MD  ONE TOUCH LANCETS [place] 1 each by Does not apply route daily. 05/05/15   Claretta Fraise, MD  ONE TOUCH ULTRA TEST test strip USE ONE STRIP TO CHECK GLUCOSE ONCE DAILY IN THE MORNING FOR FASTING BLOOD SUGAR 04/14/15   Claretta Fraise, MD  oxyCODONE-acetaminophen (PERCOCET/ROXICET) 5-325 MG tablet Take 1 tablet by mouth every 6 (six) hours as needed for severe pain. 06/02/15   Merryl Hacker, MD  OXYGEN Inhale 3-5 L into the lungs continuous. Continuous 5 LPM when up and about 3 LPM constant and at night    Historical Provider, MD  pantoprazole (PROTONIX) 40 MG tablet TAKE ONE TABLET BY MOUTH EVERY MORNING 05/20/15   Gatha Mayer, MD  Polyethyl Glycol-Propyl Glycol (SYSTANE PRESERVATIVE FREE) 0.4-0.3 % SOLN Place 1 drop into both eyes daily as needed. For dry eyes    Historical Provider, MD  polyethylene glycol powder (GLYCOLAX/MIRALAX) powder Take 17 g by mouth 2 (two) times daily as needed. Patient taking differently: Take 17 g by mouth 2  (two) times daily as needed for moderate constipation.  01/27/15   Claretta Fraise, MD  potassium chloride (MICRO-K) 10 MEQ CR capsule TAKE THREE CAPSULES BY MOUTH TWICE DAILY 05/18/15   Claretta Fraise, MD  Probiotic Product (TRUBIOTICS PO) Take 1 capsule by mouth daily.     Historical Provider, MD  promethazine (PHENERGAN) 25 MG tablet TAKE ONE TABLET BY MOUTH 4 TIMES DAILY AS NEEDED FOR NAUSEA 02/25/15   Eustaquio Maize, MD  rOPINIRole (REQUIP) 1 MG tablet TAKE 1 TABLET BY MOUTH AT BEDTIME 02/05/15   Historical Provider, MD  Sirolimus 0.5 MG  TABS Take 0.5 mg by mouth daily.  03/31/15   Historical Provider, MD  Skin Protectants, Misc. (EUCERIN) cream Apply 1 application topically daily.    Historical Provider, MD  SPIRIVA HANDIHALER 18 MCG inhalation capsule Place 1 capsule (18 mcg total) into inhaler and inhale daily. 12/18/14   Claretta Fraise, MD  sulfamethoxazole-trimethoprim (BACTRIM DS,SEPTRA DS) 800-160 MG tablet Take 1 tablet by mouth every Monday, Wednesday, and Friday. 05/01/15   Historical Provider, MD  torsemide (DEMADEX) 100 MG tablet Take 2 tablets (200 mg total) by mouth daily. 05/12/15   Claretta Fraise, MD  traZODone (DESYREL) 150 MG tablet TAKE ONE OR TWO TABLETS BY MOUTH AT BEDTIME FOR SLEEP 04/15/15   Claretta Fraise, MD  vitamin B-12 (CYANOCOBALAMIN) 1000 MCG tablet Take 1,000 mcg by mouth daily.     Historical Provider, MD   BP 115/99 mmHg  Pulse 71  Temp(Src) 98.4 F (36.9 C) (Oral)  Resp 20  Ht 5\' 6"  (1.676 m)  Wt 205 lb (92.987 kg)  BMI 33.10 kg/m2  SpO2 100% Physical Exam  Constitutional: He is oriented to person, place, and time. No distress.  Chronically ill-appearing no acute distress  HENT:  Head: Normocephalic and atraumatic.  Mouth/Throat: Oropharynx is clear and moist.  No abrasions or contusions noted about the head, no evidence of battle sign or raccoon eyes  Eyes: Pupils are equal, round, and reactive to light.  Neck: Normal range of motion. Neck supple.  No midline  C-spine tenderness  Cardiovascular: Normal rate, regular rhythm and normal heart sounds.   No murmur heard. Pulmonary/Chest: Effort normal. No respiratory distress. He has no wheezes.  Coarse breath sounds bilaterally  Abdominal: Soft. There is no tenderness.  Musculoskeletal: He exhibits no edema.  Normal range of motion of the left knee, no obvious deformities, no contusions noted There is a large 8 cm hematoma over the right anterior shin, 2+ DP pulse, neurovascularly intact distally, no obvious deformities  Neurological: He is alert and oriented to person, place, and time.  Skin: Skin is warm and dry.  Psychiatric: He has a normal mood and affect.  Nursing note and vitals reviewed.   ED Course  Procedures (including critical care time) Labs Review Labs Reviewed  CBC WITH DIFFERENTIAL/PLATELET - Abnormal; Notable for the following:    RBC 2.62 (*)    Hemoglobin 9.4 (*)    HCT 28.5 (*)    MCV 108.8 (*)    MCH 35.9 (*)    RDW 15.7 (*)    Platelets 112 (*)    All other components within normal limits  PROTIME-INR    Imaging Review Dg Tibia/fibula Right  06/01/2015  CLINICAL DATA:  Pain and hematoma in the distal right lower extremity after fall EXAM: RIGHT TIBIA AND FIBULA - 2 VIEW COMPARISON:  None. FINDINGS: There is soft tissue swelling and increased soft tissue density in the anterior distal right lower extremity at the level of the proximal right tibial shaft. No fracture or suspicious focal osseous lesion. No radiopaque foreign body. Vascular calcifications are seen throughout the soft tissues. IMPRESSION: No fracture. Swelling and increased density in the soft tissues anterior to the proximal right tibial shaft, consistent with the provided history of a soft tissue contusion. Electronically Signed   By: Ilona Sorrel M.D.   On: 06/01/2015 21:49   I have personally reviewed and evaluated these images and lab results as part of my medical decision-making.   EKG  Interpretation None      MDM  Final diagnoses:  Hematoma of leg, right, initial encounter  Fall, initial encounter  Thrombocytopenia [facility])    Patient presents with a large hematoma to the right leg after a reported mechanical fall. He states that he hit his head but has no obvious signs of head trauma.  Fall occurred approximately 5 hours ago. At this time do not feel he needs a head CT. Plain films of the right leg are negative for fracture. Given the extent of the hematoma, basic labwork was obtained. He does have baseline thrombocytopenia. Platelets of 112. Normal PT/INR. Hemoglobin is at baseline. Discussed with patient supportive care including compression wrap, ice, elevation and pain management. Patient stated understanding. Follow-up PCP for wound check in 2 days.  After history, exam, and medical workup I feel the patient has been appropriately medically screened and is safe for discharge home. Pertinent diagnoses were discussed with the patient. Patient was given return precautions.     Merryl Hacker, MD 06/02/15 [phone]

## 2015-06-03 ENCOUNTER — Encounter: Payer: Self-pay | Admitting: Family Medicine

## 2015-06-03 ENCOUNTER — Ambulatory Visit (INDEPENDENT_AMBULATORY_CARE_PROVIDER_SITE_OTHER): Payer: Medicare HMO | Admitting: Family Medicine

## 2015-06-03 VITALS — BP 147/82 | HR 84 | Temp 97.0°F | Ht 66.0 in | Wt 208.8 lb

## 2015-06-03 DIAGNOSIS — S8011XA Contusion of right lower leg, initial encounter: Secondary | ICD-10-CM

## 2015-06-03 DIAGNOSIS — R296 Repeated falls: Secondary | ICD-10-CM

## 2015-06-03 NOTE — Progress Notes (Signed)
Subjective:  Patient ID: Derek Shepherd, male    DOB: 03-10-1955  Age: 60 y.o. MRN: [id number]  CC: Hospitalization Follow-up   HPI ALDOUS LANDSMAN presents for Recheck of hematoma of the right lower extremity. Patient fell 2 days ago and landed on the right shin. He experienced immediate pain and swelling at the midshaft region of the right leg. He went to the emergency room and that record is reviewed. He was told to keep it elevated and use ice and heat. They decline to drain the lesion at the time. He presents today with increasing pain and enlargement of the hematoma it is now bulging out from the leg although the daughter says that it is less tense than it was 2 days ago.   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   He has past surgical history that includes Cholecystectomy; Lung surgery (Left); Exploratory laparotomy; Hernia repair; Colonoscopy w/ biopsies; and Bone marrow transplant (2011).   His family history includes Clotting disorder in his mother; Diabetes in his brother, father, maternal aunt, and sister; Heart attack in his father and mother; Prostate cancer in his brother.He reports that he quit smoking about 21 years ago. His smoking use included Cigarettes. He has never used smokeless tobacco. He reports that he drinks alcohol. He reports that he does not use illicit drugs.    ROS Review of Systems  Constitutional: Positive for activity change (;Wheelchair-bound due to leg pain) and fatigue. Negative  for fever, chills and diaphoresis.  HENT: Negative for rhinorrhea and sore throat.   Respiratory: Negative for cough and shortness of breath.   Cardiovascular: Negative for chest pain.  Gastrointestinal: Negative for abdominal pain.  Musculoskeletal: Positive for myalgias and arthralgias.  Skin: Negative for rash.  Neurological: Positive for weakness and light-headedness. Negative for headaches.    Objective:  BP 147/82 mmHg  Pulse 84  Temp(Src) 97 F (36.1 C) (Oral)  Ht 5\' 6"  (1.676 m)  Wt 208 lb 12.8 oz (94.711 kg)  BMI 33.72 kg/m2  SpO2 97%  BP Readings from Last 3 Encounters:  06/03/15 147/82  06/01/15 115/99  05/12/15 119/68    Wt Readings from Last 3 Encounters:  06/03/15 208 lb 12.8 oz (94.711 kg)  06/01/15 205 lb (92.987 kg)  05/12/15 218 lb 6 oz (99.054 kg)     Physical Exam  Constitutional: No distress.  Chronically ill appearing  HENT:  Head: Normocephalic and atraumatic.  Right Ear: Tympanic membrane and external ear normal. No decreased hearing is noted.  Left Ear: Tympanic membrane and external ear normal. No decreased hearing is noted.  Mouth/Throat: No oropharyngeal exudate or posterior oropharyngeal erythema.  Eyes: Pupils are equal, round, and reactive to light.  Neck: Normal range of motion. Neck supple.  Cardiovascular: Normal rate and regular rhythm.   No murmur heard. Pulmonary/Chest: Breath sounds normal. No respiratory distress.  Abdominal: Soft. Bowel sounds are normal. He exhibits no mass. There is no tenderness.  Musculoskeletal:  Decreased ability to ambulate due to pain from the right lower extremity  Skin:  There is a large hematoma raised 2-3 cm off of the surface. It is 6 cm in diameter. It  is located at the mid shaft tibia region of the medial right leg. It is fluctuant. There is deep bruising throughout the area and some draining inferiorly.  Vitals reviewed.    Lab Results  Component Value Date   WBC 6.9 06/02/2015   HGB  9.4* 06/02/2015   HCT 28.5* 06/02/2015   PLT 112* 06/02/2015   GLUCOSE 156* 05/12/2015   CHOL 214* 12/01/2014   TRIG 290* 12/01/2014   HDL 48 12/01/2014   LDLCALC 108* 12/01/2014   ALT 27 05/03/2015   AST 28 05/03/2015   NA 143 05/12/2015   K 3.5 05/12/2015   CL 92* 05/12/2015   CREATININE 0.85 05/12/2015   BUN 11 05/12/2015   CO2 35* 05/12/2015   PSA 1.0 02/15/2013   INR 1.00 06/02/2015   HGBA1C 5.6 12/27/2014    No results found.  Assessment & Plan:   Rashann was seen today for hospitalization follow-up.  Diagnoses and all orders for this visit:  Traumatic hematoma of right lower leg, initial encounter -     Ambulatory referral to General Surgery  Frequent falls       No orders of the defined types were placed in this encounter.     Follow-up: Return in about 1 week (around 06/10/2015).  Claretta Fraise, M.D.

## 2015-06-05 NOTE — Telephone Encounter (Signed)
See previous encounter

## 2015-06-09 ENCOUNTER — Telehealth: Payer: Self-pay | Admitting: Family Medicine

## 2015-06-09 ENCOUNTER — Ambulatory Visit: Payer: Medicare HMO | Admitting: Neurology

## 2015-06-09 ENCOUNTER — Other Ambulatory Visit: Payer: Self-pay | Admitting: Family Medicine

## 2015-06-10 MED ORDER — TORSEMIDE 100 MG PO TABS: 200.0000 mg | ORAL_TABLET | Freq: Every day | ORAL | Status: DC

## 2015-06-10 NOTE — Telephone Encounter (Signed)
That was done on March 7. Not sure why he didin't get it. I resent to [facility]

## 2015-06-10 NOTE — Telephone Encounter (Signed)
Patient aware.

## 2015-06-10 NOTE — Telephone Encounter (Signed)
Please advise 

## 2015-06-18 ENCOUNTER — Observation Stay (HOSPITAL_COMMUNITY)
Admission: EM | Admit: 2015-06-18 | Discharge: 2015-06-20 | Disposition: A | Discharge status: Home / Self Care | Payer: Medicare HMO | Attending: Family Medicine | Admitting: Family Medicine

## 2015-06-18 ENCOUNTER — Emergency Department (HOSPITAL_COMMUNITY): Payer: Medicare HMO

## 2015-06-18 ENCOUNTER — Encounter (HOSPITAL_COMMUNITY): Payer: Self-pay | Admitting: *Deleted

## 2015-06-18 DIAGNOSIS — Z87891 Personal history of nicotine dependence: Secondary | ICD-10-CM | POA: Insufficient documentation

## 2015-06-18 DIAGNOSIS — Z86718 Personal history of other venous thrombosis and embolism: Secondary | ICD-10-CM | POA: Insufficient documentation

## 2015-06-18 DIAGNOSIS — L0390 Cellulitis, unspecified: Secondary | ICD-10-CM | POA: Diagnosis present

## 2015-06-18 DIAGNOSIS — W228XXD Striking against or struck by other objects, subsequent encounter: Secondary | ICD-10-CM | POA: Insufficient documentation

## 2015-06-18 DIAGNOSIS — S8011XD Contusion of right lower leg, subsequent encounter: Secondary | ICD-10-CM | POA: Insufficient documentation

## 2015-06-18 DIAGNOSIS — J449 Chronic obstructive pulmonary disease, unspecified: Secondary | ICD-10-CM | POA: Insufficient documentation

## 2015-06-18 DIAGNOSIS — Z8572 Personal history of non-Hodgkin lymphomas: Secondary | ICD-10-CM | POA: Insufficient documentation

## 2015-06-18 DIAGNOSIS — E872 Acidosis: Secondary | ICD-10-CM | POA: Insufficient documentation

## 2015-06-18 DIAGNOSIS — L03115 Cellulitis of right lower limb: Principal | ICD-10-CM | POA: Insufficient documentation

## 2015-06-18 DIAGNOSIS — I509 Heart failure, unspecified: Secondary | ICD-10-CM | POA: Insufficient documentation

## 2015-06-18 DIAGNOSIS — I110 Hypertensive heart disease with heart failure: Secondary | ICD-10-CM | POA: Insufficient documentation

## 2015-06-18 DIAGNOSIS — E119 Type 2 diabetes mellitus without complications: Secondary | ICD-10-CM | POA: Insufficient documentation

## 2015-06-18 DIAGNOSIS — Z9221 Personal history of antineoplastic chemotherapy: Secondary | ICD-10-CM | POA: Insufficient documentation

## 2015-06-18 LAB — URINALYSIS, ROUTINE W REFLEX MICROSCOPIC
BILIRUBIN URINE: NEGATIVE
GLUCOSE, UA: NEGATIVE mg/dL
KETONES UR: NEGATIVE mg/dL
Nitrite: NEGATIVE
Protein, ur: NEGATIVE mg/dL
Specific Gravity, Urine: 1.011 (ref 1.005–1.030)
pH: 6.5 (ref 5.0–8.0)

## 2015-06-18 LAB — CBC WITH DIFFERENTIAL/PLATELET
BASOS PCT: 0 %
Basophils Absolute: 0 10*3/uL (ref 0.0–0.1)
Eosinophils Absolute: 0.1 10*3/uL (ref 0.0–0.7)
Eosinophils Relative: 1 %
HEMATOCRIT: 30.4 % — AB (ref 39.0–52.0)
Hemoglobin: 9.9 g/dL — ABNORMAL LOW (ref 13.0–17.0)
Lymphocytes Relative: 27 %
Lymphs Abs: 2 10*3/uL (ref 0.7–4.0)
MCH: 35.4 pg — AB (ref 26.0–34.0)
MCHC: 32.6 g/dL (ref 30.0–36.0)
MCV: 108.6 fL — ABNORMAL HIGH (ref 78.0–100.0)
MONO ABS: 0.4 10*3/uL (ref 0.1–1.0)
MONOS PCT: 6 %
NEUTROS ABS: 5 10*3/uL (ref 1.7–7.7)
Neutrophils Relative %: 66 %
Platelets: 130 10*3/uL — ABNORMAL LOW (ref 150–400)
RBC: 2.8 MIL/uL — ABNORMAL LOW (ref 4.22–5.81)
RDW: 15.3 % (ref 11.5–15.5)
WBC: 7.4 10*3/uL (ref 4.0–10.5)

## 2015-06-18 LAB — I-STAT CG4 LACTIC ACID, ED
LACTIC ACID, VENOUS: 2.93 mmol/L — AB (ref 0.5–2.0)
Lactic Acid, Venous: 0.94 mmol/L (ref 0.5–2.0)

## 2015-06-18 LAB — URINE MICROSCOPIC-ADD ON: SQUAMOUS EPITHELIAL / LPF: NONE SEEN

## 2015-06-18 LAB — COMPREHENSIVE METABOLIC PANEL
ALBUMIN: 3.8 g/dL (ref 3.5–5.0)
ALT: 24 U/L (ref 17–63)
ANION GAP: 13 (ref 5–15)
AST: 33 U/L (ref 15–41)
Alkaline Phosphatase: 79 U/L (ref 38–126)
BILIRUBIN TOTAL: 0.4 mg/dL (ref 0.3–1.2)
BUN: 17 mg/dL (ref 6–20)
CO2: 34 mmol/L — ABNORMAL HIGH (ref 22–32)
Calcium: 9.8 mg/dL (ref 8.9–10.3)
Chloride: 90 mmol/L — ABNORMAL LOW (ref 101–111)
Creatinine, Ser: 0.99 mg/dL (ref 0.61–1.24)
GFR calc Af Amer: >60 mL/min (ref >60)
Glucose, Bld: 187 mg/dL — ABNORMAL HIGH (ref 65–99)
POTASSIUM: 4.1 mmol/L (ref 3.5–5.1)
SODIUM: 137 mmol/L (ref 135–145)
TOTAL PROTEIN: 7.3 g/dL (ref 6.5–8.1)

## 2015-06-18 MED ORDER — ALBUTEROL SULFATE (2.5 MG/3ML) 0.083% IN NEBU: 2.5000 mg | INHALATION_SOLUTION | Freq: Four times a day (QID) | RESPIRATORY_TRACT | Status: DC | PRN

## 2015-06-18 MED ORDER — FOLIC ACID 1 MG PO TABS
1.0000 mg | ORAL_TABLET | Freq: Every day | ORAL | Status: DC
  Administered 2015-06-19 – 2015-06-20 (×2): 1 mg via ORAL
  Filled 2015-06-18 (×3): qty 1

## 2015-06-18 MED ORDER — SODIUM CHLORIDE 0.9 % IV BOLUS (SEPSIS)
1000.0000 mL | Freq: Once | INTRAVENOUS | Status: AC
  Administered 2015-06-18: 1000 mL via INTRAVENOUS

## 2015-06-18 MED ORDER — ALPRAZOLAM 0.25 MG PO TABS
0.2500 mg | ORAL_TABLET | Freq: Two times a day (BID) | ORAL | Status: DC | PRN
Start: 2015-06-18 — End: 2015-06-20
  Administered 2015-06-20: 0.25 mg via ORAL
  Filled 2015-06-18: qty 1

## 2015-06-18 MED ORDER — SODIUM CHLORIDE 0.9 % IV SOLN
INTRAVENOUS | Status: DC
  Administered 2015-06-18 – 2015-06-20 (×3): via INTRAVENOUS

## 2015-06-18 MED ORDER — MAGNESIUM CHLORIDE 64 MG PO TBEC
2.0000 | DELAYED_RELEASE_TABLET | Freq: Every day | ORAL | Status: DC
  Administered 2015-06-19 – 2015-06-20 (×2): 128 mg via ORAL
  Filled 2015-06-18 (×2): qty 2

## 2015-06-18 MED ORDER — SIROLIMUS 0.5 MG PO TABS
0.5000 mg | ORAL_TABLET | Freq: Every day | ORAL | Status: DC
Start: 2015-06-19 — End: 2015-06-20
  Administered 2015-06-19 – 2015-06-20 (×2): 0.5 mg via ORAL
  Filled 2015-06-18 (×2): qty 1

## 2015-06-18 MED ORDER — VANCOMYCIN HCL 10 G IV SOLR
1250.0000 mg | Freq: Two times a day (BID) | INTRAVENOUS | Status: DC
  Administered 2015-06-19 – 2015-06-20 (×3): 1250 mg via INTRAVENOUS
  Filled 2015-06-18 (×5): qty 1250

## 2015-06-18 MED ORDER — HYDROCERIN EX CREA
1.0000 "application " | TOPICAL_CREAM | Freq: Every day | CUTANEOUS | Status: DC
  Administered 2015-06-19 – 2015-06-20 (×2): 1 via TOPICAL
  Filled 2015-06-18: qty 113

## 2015-06-18 MED ORDER — MOMETASONE FURO-FORMOTEROL FUM 200-5 MCG/ACT IN AERO
2.0000 | INHALATION_SPRAY | Freq: Two times a day (BID) | RESPIRATORY_TRACT | Status: DC
Start: 2015-06-18 — End: 2015-06-20
  Administered 2015-06-19 – 2015-06-20 (×3): 2 via RESPIRATORY_TRACT
  Filled 2015-06-18: qty 8.8

## 2015-06-18 MED ORDER — SODIUM CHLORIDE 0.9 % IV SOLN
2000.0000 mg | Freq: Once | INTRAVENOUS | Status: AC
  Administered 2015-06-18: 2000 mg via INTRAVENOUS
  Filled 2015-06-18: qty 2000

## 2015-06-18 MED ORDER — PIPERACILLIN-TAZOBACTAM 3.375 G IVPB 30 MIN
3.3750 g | Freq: Once | INTRAVENOUS | Status: AC
  Administered 2015-06-18: 3.375 g via INTRAVENOUS
  Filled 2015-06-18: qty 50

## 2015-06-18 MED ORDER — OXYCODONE-ACETAMINOPHEN 5-325 MG PO TABS
1.0000 | ORAL_TABLET | Freq: Once | ORAL | Status: AC
  Administered 2015-06-18: 1 via ORAL
  Filled 2015-06-18: qty 1

## 2015-06-18 MED ORDER — MONTELUKAST SODIUM 10 MG PO TABS
10.0000 mg | ORAL_TABLET | Freq: Every day | ORAL | Status: DC
  Administered 2015-06-19 – 2015-06-20 (×2): 10 mg via ORAL
  Filled 2015-06-18 (×3): qty 1

## 2015-06-18 MED ORDER — FLUCONAZOLE 100 MG PO TABS
200.0000 mg | ORAL_TABLET | Freq: Every day | ORAL | Status: DC
  Administered 2015-06-19 – 2015-06-20 (×2): 200 mg via ORAL
  Filled 2015-06-18 (×3): qty 2

## 2015-06-18 MED ORDER — VITAMIN B-12 1000 MCG PO TABS
1000.0000 ug | ORAL_TABLET | Freq: Every day | ORAL | Status: DC
  Administered 2015-06-19 – 2015-06-20 (×2): 1000 ug via ORAL
  Filled 2015-06-18 (×2): qty 1

## 2015-06-18 MED ORDER — ADULT MULTIVITAMIN W/MINERALS CH
1.0000 | ORAL_TABLET | Freq: Every day | ORAL | Status: DC
  Administered 2015-06-19 – 2015-06-20 (×2): 1 via ORAL
  Filled 2015-06-18 (×2): qty 1

## 2015-06-18 MED ORDER — PANTOPRAZOLE SODIUM 40 MG PO TBEC
40.0000 mg | DELAYED_RELEASE_TABLET | Freq: Every morning | ORAL | Status: DC
  Administered 2015-06-19 – 2015-06-20 (×2): 40 mg via ORAL
  Filled 2015-06-18 (×2): qty 1

## 2015-06-18 MED ORDER — POLYETHYLENE GLYCOL 3350 17 GM/SCOOP PO POWD: 17.0000 g | Freq: Two times a day (BID) | ORAL | Status: DC | PRN

## 2015-06-18 MED ORDER — ENOXAPARIN SODIUM 40 MG/0.4ML ~~LOC~~ SOLN
40.0000 mg | SUBCUTANEOUS | Status: DC
  Administered 2015-06-18 – 2015-06-19 (×2): 40 mg via SUBCUTANEOUS
  Filled 2015-06-18 (×2): qty 0.4

## 2015-06-18 MED ORDER — ESCITALOPRAM OXALATE 10 MG PO TABS
10.0000 mg | ORAL_TABLET | Freq: Every day | ORAL | Status: DC
  Administered 2015-06-19 – 2015-06-20 (×2): 10 mg via ORAL
  Filled 2015-06-18 (×3): qty 1

## 2015-06-18 MED ORDER — LACTULOSE 10 GM/15ML PO SOLN
20.0000 g | Freq: Two times a day (BID) | ORAL | Status: DC
  Administered 2015-06-19: 20 g via ORAL
  Filled 2015-06-18 (×4): qty 30

## 2015-06-18 MED ORDER — ROPINIROLE HCL 1 MG PO TABS
1.0000 mg | ORAL_TABLET | Freq: Every day | ORAL | Status: DC
  Administered 2015-06-18 – 2015-06-19 (×2): 1 mg via ORAL
  Filled 2015-06-18 (×2): qty 1

## 2015-06-18 MED ORDER — SULFAMETHOXAZOLE-TRIMETHOPRIM 800-160 MG PO TABS
1.0000 | ORAL_TABLET | ORAL | Status: DC
  Administered 2015-06-19: 1 via ORAL
  Filled 2015-06-18: qty 1

## 2015-06-18 MED ORDER — TIOTROPIUM BROMIDE MONOHYDRATE 18 MCG IN CAPS
18.0000 ug | ORAL_CAPSULE | Freq: Every day | RESPIRATORY_TRACT | Status: DC
  Administered 2015-06-19 – 2015-06-20 (×2): 18 ug via RESPIRATORY_TRACT
  Filled 2015-06-18: qty 5

## 2015-06-18 MED ORDER — DIPHENHYDRAMINE HCL 25 MG PO CAPS
25.0000 mg | ORAL_CAPSULE | Freq: Once | ORAL | Status: AC
  Administered 2015-06-18: 25 mg via ORAL
  Filled 2015-06-18: qty 1

## 2015-06-18 MED ORDER — LIDOCAINE HCL (PF) 1 % IJ SOLN
5.0000 mL | Freq: Once | INTRAMUSCULAR | Status: AC
  Administered 2015-06-18: 5 mL via INTRADERMAL
  Filled 2015-06-18: qty 5

## 2015-06-18 MED ORDER — POLYETHYLENE GLYCOL 3350 17 G PO PACK: 17.0000 g | PACK | Freq: Two times a day (BID) | ORAL | Status: DC | PRN

## 2015-06-18 MED ORDER — LINACLOTIDE 72 MCG PO CAPS: 1.0000 | ORAL_CAPSULE | Freq: Every day | ORAL | Status: DC | PRN

## 2015-06-18 MED ORDER — TRAZODONE HCL 150 MG PO TABS
150.0000 mg | ORAL_TABLET | Freq: Every day | ORAL | Status: DC
  Administered 2015-06-18 – 2015-06-19 (×2): 150 mg via ORAL
  Filled 2015-06-18 (×2): qty 1

## 2015-06-18 MED ORDER — ZOLPIDEM TARTRATE 5 MG PO TABS
5.0000 mg | ORAL_TABLET | Freq: Once | ORAL | Status: AC
  Administered 2015-06-18: 5 mg via ORAL
  Filled 2015-06-18: qty 1

## 2015-06-18 MED ORDER — PIPERACILLIN-TAZOBACTAM 3.375 G IVPB
3.3750 g | Freq: Three times a day (TID) | INTRAVENOUS | Status: DC
  Administered 2015-06-19 – 2015-06-20 (×5): 3.375 g via INTRAVENOUS
  Filled 2015-06-18 (×7): qty 50

## 2015-06-18 NOTE — H&P (Signed)
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 He said the wound has been getting progressively larger with a central black scar and increasing pain and redness around it. He went to the ER 2 weeks ago and was asked to follow up with his PCP and surgery. Surgery advised conservative management with ice packs but it has not been helping per patient. He has no other complaints. In ROS he has mild nausea.   Review of Systems:  CONSTITUTIONAL:  No night sweats.  No fatigue, malaise, lethargy.  No fever or chills. Eyes:  No visual changes.  No eye pain.  No eye discharge.   ENT:    No epistaxis.  No sinus pain.  No sore throat.  No ear pain.  No congestion. RESPIRATORY:  No cough.  No wheeze.  No hemoptysis.  No shortness of breath. CARDIOVASCULAR:  No chest pains.  No palpitations. GASTROINTESTINAL:  No abdominal pain.  No nausea or vomiting.  No diarrhea or constipation.  No hematemesis.  No hematochezia.  No melena. GENITOURINARY:  No urgency.  No frequency.  No dysuria.  No hematuria.  No obstructive symptoms.  No discharge.  No pain.  No significant abnormal bleeding. MUSCULOSKELETAL:  No musculoskeletal pain.  No joint swelling.  No arthritis. NEUROLOGICAL:  No confusion.  No weakness. No headache. No seizure. PSYCHIATRIC:  No depression. No anxiety. No suicidal ideation. SKIN:  No rashes.  No lesions. +wounds. ENDOCRINE:  No unexplained weight loss.  No polydipsia.  No polyuria.  No polyphagia. HEMATOLOGIC:  No anemia.  No purpura.  No petechiae.  No bleeding.  ALLERGIC AND IMMUNOLOGIC:  No pruritus.  No swelling Other:  Past Medical and Surgical History:   Past Medical History    Diagnosis Date  . Leukemia-lymphoma, T-cell, acute, HTLV-I-associated ([place])   . COPD (chronic obstructive pulmonary disease) ([place])   . DVT (deep venous thrombosis) ([place])   . Personal history of colonic  adenoma 01/22/2008  . Anemia   . Anxiety   . Arthritis   . Depression   . Gallstones   . Bowel obstruction ([facility])     constipation  . Adenomatous colon polyp     tubular  . Diverticulosis   . CHF (congestive heart failure) ([place])   . GERD (gastroesophageal reflux disease)   . 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 Cholecystectomy    . Lung surgery Left   . Exploratory laparotomy    . Hernia repair      x 2  . Colonoscopy w/ biopsies    . Bone marrow transplant  2011    Social History:   reports that he quit smoking about 21 years ago. His smoking use included Cigarettes. He has never used smokeless tobacco. He reports that he drinks alcohol. He reports that he does not use illicit drugs.   Allergies  Allergen Reactions  . Nsaids Shortness Of Breath    Family History  Problem Relation Age of Onset  . Prostate cancer Brother   . Clotting disorder Mother   . Diabetes Father   . Diabetes Brother  x 3  . Diabetes Sister     x 3  . Diabetes Maternal Aunt   . Heart attack Mother   . Heart attack Father       Prior to Admission medications   Medication Sig Start Date End Date Taking? Authorizing Provider  albuterol (PROVENTIL) (2.5 MG/3ML) 0.083% nebulizer solution Take 3 mLs (2.5 mg total) by nebulization every 6 (six) hours as needed for wheezing or shortness of breath. 05/24/14  Yes Claretta Fraise, MD  ALPRAZolam Duanne Moron) 0.25 MG tablet TAKE ONE-HALF TABLET BY MOUTH ONCE DAILY AT BEDTIME 06/02/15  Yes Claretta Fraise, MD  budesonide-formoterol [facility]) 160-4.5 MCG/ACT inhaler Inhale 2 puffs into the lungs every morning. 08/18/14  Yes Claretta Fraise, MD  Elastic Bandages &  Supports (ANKLE LACE-UP BRACE) MISC Wear daily for two weeks then as needed ASO  Style brace 05/05/15  Yes Claretta Fraise, MD  escitalopram (LEXAPRO) 10 MG tablet Take 1 tablet (10 mg total) by mouth daily. For depression 05/05/15  Yes Claretta Fraise, MD  fluconazole (DIFLUCAN) 200 MG tablet Take 200 mg by mouth daily.   Yes Historical Provider, MD  folic acid (FOLVITE) 1 MG tablet Take 1 mg by mouth daily.  02/11/14  Yes Historical Provider, MD  lactulose (CHRONULAC) 10 GM/15ML solution Take 30 mLs (20 g total) by mouth 2 (two) times daily. For constipation 04/21/15  Yes Claretta Fraise, MD  levalbuterol Penne Lash) 1.25 MG/0.5ML nebulizer solution Take 1.25 mg by nebulization 4 (four) times daily. 04/21/15  Yes Claretta Fraise, MD  Linaclotide Rolan Lipa) 72 MCG CAPS Take 1 tablet by mouth daily as needed (for constipation). 05/28/15  Yes Claretta Fraise, MD  Magnesium Chloride-Calcium 64-106 MG TBEC Take 2 tablets by mouth daily.    Yes Historical Provider, MD  montelukast (SINGULAIR) 10 MG tablet Take 10 mg by mouth daily. 03/23/15  Yes Historical Provider, MD  Multiple Vitamins tablet Take 1 tablet by mouth daily.    Yes Historical Provider, MD  OXYGEN Inhale 3-5 L into the lungs continuous. Continuous 5 LPM when up and about 3 LPM constant and at night   Yes Historical Provider, MD  pantoprazole (PROTONIX) 40 MG tablet TAKE ONE TABLET BY MOUTH EVERY MORNING 05/20/15  Yes Gatha Mayer, MD  Polyethyl Glycol-Propyl Glycol (SYSTANE PRESERVATIVE FREE) 0.4-0.3 % SOLN Place 1 drop into both eyes daily as needed. For dry eyes   Yes Historical Provider, MD  polyethylene glycol powder (GLYCOLAX/MIRALAX) powder Take 17 g by mouth 2 (two) times daily as needed. Patient taking differently: Take 17 g by mouth 2 (two) times daily as needed for moderate constipation.  01/27/15  Yes Claretta Fraise, MD  potassium chloride (MICRO-K) 10 MEQ CR capsule TAKE THREE CAPSULES BY MOUTH TWICE DAILY 05/18/15  Yes Claretta Fraise, MD    Probiotic Product (TRUBIOTICS PO) Take 1 capsule by mouth daily.    Yes Historical Provider, MD  rOPINIRole (REQUIP) 1 MG tablet TAKE 1 TABLET BY MOUTH AT BEDTIME 06/09/15  Yes Claretta Fraise, MD  Sirolimus 0.5 MG TABS Take 0.5 mg by mouth daily.  03/31/15  Yes Historical Provider, MD  Skin Protectants, Misc. (EUCERIN) cream Apply 1 application topically daily.   Yes Historical Provider, MD  SPIRIVA HANDIHALER 18 MCG inhalation capsule Place 1 capsule (18 mcg total) into inhaler and inhale daily. 12/18/14  Yes Claretta Fraise, MD  sulfamethoxazole-trimethoprim (BACTRIM DS,SEPTRA DS) 800-160 MG tablet Take 1 tablet by mouth every Monday, Wednesday, and Friday. 05/01/15  Yes Historical Provider, MD  torsemide (  DEMADEX) 100 MG tablet Take 2 tablets (200 mg total) by mouth daily. Patient taking differently: Take 200 mg by mouth 2 (two) times daily.  06/10/15  Yes Claretta Fraise, MD  traZODone (DESYREL) 150 MG tablet TAKE ONE OR TWO TABLETS BY MOUTH AT BEDTIME FOR SLEEP 04/15/15  Yes Claretta Fraise, MD  vitamin B-12 (CYANOCOBALAMIN) 1000 MCG tablet Take 1,000 mcg by mouth daily.    Yes Historical Provider, MD    Physical Exam: BP 119/72 mmHg  Pulse 63  Temp(Src) 99 F (37.2 C) (Oral)  Resp 18  Ht 5\' 6"  (1.676 m)  Wt 95.709 kg (211 lb)  BMI 34.07 kg/m2  SpO2 100%  GENERAL :  appears to be in no acute distress. HEAD: normocephalic. EYES: PERRL, EOMI. EARS:  hearing grossly intact. NOSE: No nasal discharge. THROAT: Oral cavity and pharynx normal.  NECK: Neck supple, non-tender without lymphadenopathy, masses or thyromegaly. CARDIAC: Normal S1 and S2. No S3, S4 or murmurs. Rhythm is regular. There is no peripheral edema LUNGS: Clear to auscultation  ABDOMEN: Positive bowel sounds. Soft, nondistended, nontender.  EXTREMITIES: No significant deformity or joint abnormality.  NEUROLOGICAL: The mental examination revealed the patient was oriented to person, place, and time.CN II-XII intact. Strength and  sensation symmetric and intact throughout.  SKIN: 6*5 cm subcutaneous edema with large black scar surrounded by 10-25 cm erythema with tenderness and warmness to touch.  PSYCHIATRIC:  The patient was able to demonstrate good judgement and reason, without hallucinations, abnormal affect or abnormal behaviors during the examination. Patient is not suicidal.          Labs on Admission:  Reviewed.   Radiological Exams on Admission: Dg Tibia/fibula Right  06/18/2015  CLINICAL DATA:  [place] [address] 2 weeks ago and cut leg behind metal plate. Persistent redness and swelling. EXAM: RIGHT TIBIA AND FIBULA - 2 VIEW COMPARISON:  06/01/2015 FINDINGS: The knee and ankle joints are maintained. Mild degenerative changes. No acute fracture or destructive bony changes. Stable scattered vascular calcifications, advanced for age. IMPRESSION: No acute bony findings or destructive bony changes. No radiopaque foreign body. Electronically Signed   By: Marijo Sanes M.D.   On: 06/18/2015 16:46     Assessment/Plan  Cellulitis with possible subcutaneous abscess/hematoma: Abscess/hematoma to be drained in the ER Treatment with vanc/zosyn due to history of leukemia Gentle IVF, dc lasix for now, monitor I/O due to history of CHF.  H/o Luekemia: on remission, continue Bactrim.   HTN: cont home meds  CHF: hold lasix for now, monitor I/O  H/o emphysema: on oxygen, continue home meds    DVT prophylaxis: [place] enoxaparin  Consultants: Surgery  Code Status: Full     Gennaro Africa M.D [facility]

## 2015-06-18 NOTE — ED Provider Notes (Signed)
CSN: [id number]     Arrival date & time 06/18/15  1600 History   First MD Initiated Contact with Patient 06/18/15 1727     Chief Complaint  Patient presents with  . Leg Pain  . Nausea     (Consider location/radiation/quality/duration/timing/severity/associated sxs/prior Treatment) Patient is a 60 y.o. male presenting with leg pain. The history is provided by the patient and medical records.  Leg Pain   60 y.o. M with hx of Lymphoma currently in remission, emphysema on chronic O2, COPD, DVT not currently on anticoagulation, anemia, anxiety, depression, diverticulosis, congestive heart failure, GERD, is anything to the ED for right leg pain.  Patient states he was seen at [facility] personally 3 weeks ago after a fall. He states he hit his right shin on a metal pipe and had a large hematoma of his shin.  He states he had x-rays done which were normal and sent home.  He states he has been monitoring the area over the past few weeks but approx 1 week ago he states he developed a black "scab" and had a little bleeding from the area.  States blood was thick and almost black in color.  He denies any purulent drainage from wound.  No fever, chills at home.  Patient is on bactrim from his oncologist for infection ppx-- takes this 3x weekly.  States redness became worse so he came in for evaluation.  Patient also reports some nausea recently.  He denies any vomiting.  Has been able to eat and drink normally as well as take medications.  VSS on arrival-- patient is on his baseline 3L O2.   Past Medical History  Diagnosis Date  . Leukemia-lymphoma, T-cell, acute, HTLV-I-associated ([place])   . COPD (chronic obstructive pulmonary disease) ([place])   . DVT (deep venous thrombosis) ([place])   . Personal history of colonic  adenoma 01/22/2008  . Anemia   . Anxiety   . Arthritis   . Depression   . Gallstones   . Bowel obstruction ([facility])     constipation  . Adenomatous colon polyp     tubular  .  Diverticulosis   . CHF (congestive heart failure) ([place])   . GERD (gastroesophageal reflux disease)   . 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 Cholecystectomy    . Lung surgery Left   . Exploratory laparotomy    . Hernia repair      x 2  . Colonoscopy w/ biopsies    . Bone marrow transplant  2011   Family History  Problem Relation Age of Onset  . Prostate cancer Brother   . Clotting disorder Mother   . Diabetes Father   . Diabetes Brother     x 3  . Diabetes Sister     x 3  . Diabetes Maternal Aunt   . Heart attack Mother   . Heart attack Father    Social History  Substance Use Topics  . Smoking status: Former Smoker    Types: Cigarettes    Quit date: 07/13/1993  . Smokeless tobacco: Never Used  . Alcohol Use: 0.0 oz/week    0 Standard drinks or equivalent per week     Comment: socially    Review of Systems  Skin: Positive for wound.  All other systems reviewed and are negative.     Allergies  Nsaids  Home Medications   Prior to Admission medications  Medication Sig Start Date End Date Taking? Authorizing Provider  albuterol (PROVENTIL) (2.5 MG/3ML) 0.083% nebulizer solution Take 3 mLs (2.5 mg total) by nebulization every 6 (six) hours as needed for wheezing or shortness of breath. 05/24/14   Claretta Fraise, MD  ALPRAZolam Duanne Moron) 0.25 MG tablet TAKE ONE-HALF TABLET BY MOUTH ONCE DAILY AT BEDTIME 06/02/15   Claretta Fraise, MD  budesonide-formoterol [facility]) 160-4.5 MCG/ACT inhaler Inhale 2 puffs into the lungs every morning. 08/18/14   Claretta Fraise, MD  Elastic Bandages & Supports (ANKLE LACE-UP BRACE) MISC Wear daily for two weeks then as needed ASO  Style brace 05/05/15   Claretta Fraise, MD  escitalopram (LEXAPRO) 10 MG tablet Take 1 tablet (10 mg total) by mouth daily. For depression 05/05/15   Claretta Fraise, MD  fentaNYL (DURAGESIC - DOSED MCG/HR) 25 MCG/HR patch  Place 25 mcg onto the skin every other day.  01/22/13   Historical Provider, MD  fluconazole (DIFLUCAN) 200 MG tablet Take 200 mg by mouth daily.    Historical Provider, MD  folic acid (FOLVITE) 1 MG tablet Take 1 mg by mouth daily.  02/11/14   Historical Provider, MD  lactulose (CHRONULAC) 10 GM/15ML solution Take 30 mLs (20 g total) by mouth 2 (two) times daily. For constipation 04/21/15   Claretta Fraise, MD  levalbuterol Penne Lash) 1.25 MG/0.5ML nebulizer solution Take 1.25 mg by nebulization 4 (four) times daily. 04/21/15   Claretta Fraise, MD  Linaclotide Rolan Lipa) 72 MCG CAPS Take 1 tablet by mouth daily as needed (for constipation). 05/28/15   Claretta Fraise, MD  Magnesium Chloride-Calcium 64-106 MG TBEC Take 2 tablets by mouth daily.     Historical Provider, MD  montelukast (SINGULAIR) 10 MG tablet Take 10 mg by mouth daily. 03/23/15   Historical Provider, MD  Multiple Vitamins tablet Take 1 tablet by mouth daily.     Historical Provider, MD  NUCYNTA 50 MG TABS tablet Take 50 mg by mouth daily as needed for moderate pain.  04/22/15   Historical Provider, MD  ONE TOUCH LANCETS [place] 1 each by Does not apply route daily. 05/05/15   Claretta Fraise, MD  ONE TOUCH ULTRA TEST test strip USE ONE STRIP TO CHECK GLUCOSE ONCE DAILY IN THE MORNING FOR FASTING BLOOD SUGAR 04/14/15   Claretta Fraise, MD  oxyCODONE-acetaminophen (PERCOCET/ROXICET) 5-325 MG tablet Take 1 tablet by mouth every 6 (six) hours as needed for severe pain. 06/02/15   Merryl Hacker, MD  OXYGEN Inhale 3-5 L into the lungs continuous. Continuous 5 LPM when up and about 3 LPM constant and at night    Historical Provider, MD  pantoprazole (PROTONIX) 40 MG tablet TAKE ONE TABLET BY MOUTH EVERY MORNING 05/20/15   Gatha Mayer, MD  Polyethyl Glycol-Propyl Glycol (SYSTANE PRESERVATIVE FREE) 0.4-0.3 % SOLN Place 1 drop into both eyes daily as needed. For dry eyes    Historical Provider, MD  polyethylene glycol powder (GLYCOLAX/MIRALAX) powder Take 17 g  by mouth 2 (two) times daily as needed. Patient taking differently: Take 17 g by mouth 2 (two) times daily as needed for moderate constipation.  01/27/15   Claretta Fraise, MD  potassium chloride (MICRO-K) 10 MEQ CR capsule TAKE THREE CAPSULES BY MOUTH TWICE DAILY 05/18/15   Claretta Fraise, MD  Probiotic Product (TRUBIOTICS PO) Take 1 capsule by mouth daily.     Historical Provider, MD  promethazine (PHENERGAN) 25 MG tablet TAKE ONE TABLET BY MOUTH 4 TIMES DAILY AS NEEDED FOR NAUSEA 02/25/15   Berlin Hun  Evette Doffing, MD  rOPINIRole (REQUIP) 1 MG tablet TAKE 1 TABLET BY MOUTH AT BEDTIME 06/09/15   Claretta Fraise, MD  Sirolimus 0.5 MG TABS Take 0.5 mg by mouth daily.  03/31/15   Historical Provider, MD  Skin Protectants, Misc. (EUCERIN) cream Apply 1 application topically daily.    Historical Provider, MD  SPIRIVA HANDIHALER 18 MCG inhalation capsule Place 1 capsule (18 mcg total) into inhaler and inhale daily. 12/18/14   Claretta Fraise, MD  sulfamethoxazole-trimethoprim (BACTRIM DS,SEPTRA DS) 800-160 MG tablet Take 1 tablet by mouth every Monday, Wednesday, and Friday. 05/01/15   Historical Provider, MD  torsemide (DEMADEX) 100 MG tablet Take 2 tablets (200 mg total) by mouth daily. 06/10/15   Claretta Fraise, MD  traZODone (DESYREL) 150 MG tablet TAKE ONE OR TWO TABLETS BY MOUTH AT BEDTIME FOR SLEEP 04/15/15   Claretta Fraise, MD  vitamin B-12 (CYANOCOBALAMIN) 1000 MCG tablet Take 1,000 mcg by mouth daily.     Historical Provider, MD   BP 130/95 mmHg  Pulse 80  Temp(Src) 99 F (37.2 C) (Oral)  Resp 18  Ht 5\' 6"  (1.676 m)  Wt 95.709 kg  BMI 34.07 kg/m2  SpO2 99%   Physical Exam  Constitutional: He is oriented to person, place, and time. He appears well-developed and well-nourished.  HENT:  Head: Normocephalic and atraumatic.  Mouth/Throat: Oropharynx is clear and moist.  Eyes: Conjunctivae and EOM are normal. Pupils are equal, round, and reactive to light.  Neck: Normal range of motion.  Cardiovascular: Normal  rate, regular rhythm and normal heart sounds.   Pulmonary/Chest: Effort normal and breath sounds normal. No respiratory distress. He has no wheezes.  Abdominal: Soft. Bowel sounds are normal.  Musculoskeletal: Normal range of motion.  Large hematoma noted to right shin; appears firm along lateral aspect, more fluctuance along medial aspect; no active bleeding or drainage; large black scab noted centrally; surrounding erythema and warmth to touch noted; no tissue crepitus noted; DP pulse intact; normal cap refill and sensation throughout right leg and foot  Neurological: He is alert and oriented to person, place, and time.  Skin: Skin is warm and dry.  Psychiatric: He has a normal mood and affect.  Nursing note and vitals reviewed.      ED Course  Procedures (including critical care time)  INCISION AND DRAINAGE Performed by: Larene Pickett Consent: Verbal consent obtained. Risks and benefits: risks, benefits and alternatives were discussed Type: abscess  Body area: right shin  Anesthesia: local infiltration  Incision was made with a scalpel.  Local anesthetic: lidocaine 1% without epinephrine  Anesthetic total: 5 ml  Complexity: complex Blunt dissection to break up loculations  Drainage: bloody  Drainage amount: large  Packing material: none  Patient tolerance: Patient tolerated the procedure well with no immediate complications.    Labs Review Labs Reviewed  COMPREHENSIVE METABOLIC PANEL - Abnormal; Notable for the following:    Chloride 90 (*)    CO2 34 (*)    Glucose, Bld 187 (*)    All other components within normal limits  URINALYSIS, ROUTINE W REFLEX MICROSCOPIC (NOT AT [facility]) - Abnormal; Notable for the following:    Hgb urine dipstick SMALL (*)    Leukocytes, UA MODERATE (*)    All other components within normal limits  CBC WITH DIFFERENTIAL/PLATELET - Abnormal; Notable for the following:    RBC 2.80 (*)    Hemoglobin 9.9 (*)    HCT 30.4 (*)    MCV  108.6 (*)  MCH 35.4 (*)    Platelets 130 (*)    All other components within normal limits  URINE MICROSCOPIC-ADD ON - Abnormal; Notable for the following:    Bacteria, UA RARE (*)    Casts HYALINE CASTS (*)    All other components within normal limits  I-STAT CG4 LACTIC ACID, ED - Abnormal; Notable for the following:    Lactic Acid, Venous 2.93 (*)    All other components within normal limits  URINE CULTURE  CULTURE, BLOOD (ROUTINE X 2)  CULTURE, BLOOD (ROUTINE X 2)  I-STAT CG4 LACTIC ACID, ED    Imaging Review Dg Tibia/fibula Right  06/18/2015  CLINICAL DATA:  [place] 2 weeks ago and cut leg behind metal plate. Persistent redness and swelling. EXAM: RIGHT TIBIA AND FIBULA - 2 VIEW COMPARISON:  06/01/2015 FINDINGS: The knee and ankle joints are maintained. Mild degenerative changes. No acute fracture or destructive bony changes. Stable scattered vascular calcifications, advanced for age. IMPRESSION: No acute bony findings or destructive bony changes. No radiopaque foreign body. Electronically Signed   By: Marijo Sanes M.D.   On: 06/18/2015 16:46   I have personally reviewed and evaluated these images and lab results as part of my medical decision-making.   EKG Interpretation None      MDM   Final diagnoses:  Cellulitis of right leg   60 year old male here with questionable infection of right leg. Sustained a large hematoma to right shin 3 weeks ago, now has surrounding erythema and warmth to touch. Denies any purulent drainage.  Hematoma is fluctuant along the medial margin without drainage or active bleeding. His leg is neurovascularly intact. No tissue crepitus. See photo above. Patient is afebrile, nontoxic. His lactic acid is mildly elevated, normal white count. Remainder of labs appear baseline for him given his history of leukemia. X-rays negative for acute foreign body or free air.  Patient has been on outpatient Bactrim, no improvement. Feel he will need admission for IV  antibiotics for cellulitis. Patient be admitted to medicine service for further management.  Prior to I&D being performed, case was discussed with general surgery, Dr. Grandville Silos, who felt this was a safe option to be performed at the bedside. Patient tolerated this well and there were no immediate complications.  Larene Pickett, PA-C 06/18/15 2118  Charlesetta Shanks, MD 06/19/15 [phone]

## 2015-06-18 NOTE — ED Notes (Signed)
PA at bedside.

## 2015-06-18 NOTE — Progress Notes (Addendum)
Received patient from ED. Admissions made aware.  Patient AOx4, ambulatory, VS stable, O2 Sat at 100% on 2L/min via [place] and patient stated that he is on O2 at home.  Patient oriented to room, bed controls and call light.  Provided subway sandwich to eat and coke to drink.  C/o of itching from lidocaine given earlier in ED and requested medication for sleep.  Paged floor coverage MD about patient's itching and sleep med requests.  Fredirick Maudlin responded by putting Benadryl 25 mg cap and Ambien 5 mg tab orders once.  Administered meds as ordered.

## 2015-06-18 NOTE — Progress Notes (Signed)
Pharmacy Antibiotic Note  Derek Shepherd is a 60 y.o. male admitted on 06/18/2015 with cellulitis.  Presents with red, warm hematoma on right lower leg.  Says his "bone hurts".  Seen at [facility] 3 weeks prior after hitting shin on metal pipe.  One week ago, black scab formed over site.  On prophylactic Bactrim DS on MWF by oncologist.  PMH includes Leukemia-Lymphoma, GvH diseases, DVT, COPD, and CHF.  Pharmacy has been consulted for Vancomycin and Zosyn dosing.  On Admit: Afebrile (99), HR 80, RR 18, WBC 7.4 LA 2.93, CrCl 89.2, ANC 5  Plan: --Vancomycin 2000 mg IV x 1, then 1250 mg IV q12h --Zosyn 3.375 g IV q8h (extended infusion) --Obtain vanc trough at steady state (goal 10-15) --Follow renal function, clinical course, and cultures   Height: 5\' 6"  (167.6 cm) Weight: 211 lb (95.709 kg) IBW/kg (Calculated) : 63.8  Temp (24hrs), Avg:99 F (37.2 C), Min:99 F (37.2 C), Max:99 F (37.2 C)   Recent Labs Lab 06/18/15 1648 06/18/15 1649  WBC  --  7.4  CREATININE  --  0.99  LATICACIDVEN 2.93*  --     Estimated Creatinine Clearance: 89.2 mL/min (by C-G formula based on Cr of 0.99).    Allergies  Allergen Reactions  . Nsaids Shortness Of Breath    Antimicrobials this admission: 4/13 Zosyn >>  4/13 Vanc >>   Dose adjustments this admission:   Microbiology results: 4/13 BCx:  4/13 UCx:     Thank you for allowing pharmacy to be a part of this patient's care.  Viann Fish 06/18/2015 6:22 PM

## 2015-06-18 NOTE — ED Notes (Signed)
Dr Monico Hoar notified of critical lactic.

## 2015-06-18 NOTE — ED Notes (Signed)
Admitting at bedside 

## 2015-06-18 NOTE — ED Notes (Signed)
Pt states was tx at [facility] 3 weeks ago for "baseball size" hematoma to RLE. States 1 week ago noticed black scab on site.  Area red and warm to touch.  Pt states the "bone hurts".  Also states nausea for several day.

## 2015-06-19 ENCOUNTER — Ambulatory Visit (HOSPITAL_COMMUNITY): Payer: Medicare HMO

## 2015-06-19 DIAGNOSIS — L03119 Cellulitis of unspecified part of limb: Secondary | ICD-10-CM | POA: Diagnosis not present

## 2015-06-19 DIAGNOSIS — L03115 Cellulitis of right lower limb: Secondary | ICD-10-CM | POA: Diagnosis not present

## 2015-06-19 LAB — BASIC METABOLIC PANEL
Anion gap: 9 (ref 5–15)
BUN: 18 mg/dL (ref 6–20)
CHLORIDE: 98 mmol/L — AB (ref 101–111)
CO2: 33 mmol/L — ABNORMAL HIGH (ref 22–32)
Calcium: 9.2 mg/dL (ref 8.9–10.3)
Creatinine, Ser: 0.93 mg/dL (ref 0.61–1.24)
GFR calc non Af Amer: >60 mL/min (ref >60)
Glucose, Bld: 112 mg/dL — ABNORMAL HIGH (ref 65–99)
POTASSIUM: 3.7 mmol/L (ref 3.5–5.1)
SODIUM: 140 mmol/L (ref 135–145)

## 2015-06-19 LAB — CBC WITH DIFFERENTIAL/PLATELET
Basophils Absolute: 0 10*3/uL (ref 0.0–0.1)
Basophils Relative: 0 %
Eosinophils Absolute: 0.1 10*3/uL (ref 0.0–0.7)
Eosinophils Relative: 2 %
HCT: 25.2 % — ABNORMAL LOW (ref 39.0–52.0)
HEMOGLOBIN: 8 g/dL — AB (ref 13.0–17.0)
LYMPHS ABS: 2.2 10*3/uL (ref 0.7–4.0)
LYMPHS PCT: 34 %
MCH: 34.5 pg — AB (ref 26.0–34.0)
MCHC: 31.7 g/dL (ref 30.0–36.0)
MCV: 108.6 fL — AB (ref 78.0–100.0)
Monocytes Absolute: 0.7 10*3/uL (ref 0.1–1.0)
Monocytes Relative: 11 %
NEUTROS ABS: 3.5 10*3/uL (ref 1.7–7.7)
NEUTROS PCT: 53 %
Platelets: 108 10*3/uL — ABNORMAL LOW (ref 150–400)
RBC: 2.32 MIL/uL — AB (ref 4.22–5.81)
RDW: 15.5 % (ref 11.5–15.5)
WBC: 6.5 10*3/uL (ref 4.0–10.5)

## 2015-06-19 MED ORDER — SIMETHICONE 80 MG PO CHEW
80.0000 mg | CHEWABLE_TABLET | Freq: Four times a day (QID) | ORAL | Status: DC | PRN
  Administered 2015-06-19: 80 mg via ORAL
  Filled 2015-06-19: qty 1

## 2015-06-19 MED ORDER — OXYCODONE HCL 5 MG PO TABS
5.0000 mg | ORAL_TABLET | ORAL | Status: DC | PRN
  Administered 2015-06-19 – 2015-06-20 (×3): 5 mg via ORAL
  Filled 2015-06-19 (×3): qty 1

## 2015-06-19 NOTE — Progress Notes (Signed)
PROGRESS NOTE                                                                                                                                                                                                             Patient Demographics:    Derek Shepherd, is a 60 y.o. male, DOB - September 14, 1955, [id number]  Admit date - 06/18/2015   Admitting Physician Derek Africa, MD  Outpatient Primary MD for the patient is Derek Fraise, MD  LOS - 1   Chief Complaint  Patient presents with  . Leg Pain  . 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   Subjective:    Derek Shepherd patient has no new complaints. No acute issues   Assessment  & Plan :    Active Problems:   Cellulitis With abscess [facility] consult orthopedic surgery to assess wound and decide whether or not to [facility]. - For now continue pain medication regimen and IV antibiotics.  Code Status : Full  Family Communication  : Discussed directly with patient  Disposition Plan  : Pending improvement in condition  Barriers For Discharge : abscess possibly requiring more than just medical management  Consults  :  Ortho  Procedures  : none  DVT Prophylaxis  :  Lovenox  Lab Results  Component Value Date   PLT 108* 06/19/2015    Antibiotics  :  Zosyn, Bactrim, Vancomycin  Anti-infectives    Start     Dose/Rate Route Frequency Ordered Stop   06/19/15 1000  sulfamethoxazole-trimethoprim (BACTRIM DS,SEPTRA DS) 800-160 MG per tablet 1 tablet     1 tablet Oral Every M-W-F 06/18/15 2021     06/19/15 0800  vancomycin (VANCOCIN) 1,250 mg in sodium chloride 0.9 % 250 mL IVPB     1,250 mg 166.7 mL/hr over 90 Minutes Intravenous Every 12 hours 06/18/15 1832     06/19/15  0100  piperacillin-tazobactam (ZOSYN) IVPB 3.375 g     3.375 g 12.5 mL/hr over 240 Minutes Intravenous Every 8 hours 06/18/15 1832     06/18/15 2030  fluconazole (DIFLUCAN) tablet 200 mg  200 mg Oral Daily 06/18/15 2021     06/18/15 1830  vancomycin (VANCOCIN) 2,000 mg in sodium chloride 0.9 % 500 mL IVPB     2,000 mg 250 mL/hr over 120 Minutes Intravenous  Once 06/18/15 1822 06/18/15 2205   06/18/15 1830  piperacillin-tazobactam (ZOSYN) IVPB 3.375 g     3.375 g 100 mL/hr over 30 Minutes Intravenous  Once 06/18/15 1822 06/18/15 1949        Objective:   Filed Vitals:   06/18/15 2126 06/19/15 0517 06/19/15 0743 06/19/15 1313  BP: 114/66 111/59  126/78  Pulse: 63 63  63  Temp: 98.5 F (36.9 C) 97.8 F (36.6 C)  98.2 F (36.8 C)  TempSrc: Oral Oral  Oral  Resp: 19 18  18   Height: 5\' 6"  (1.676 m)     Weight: 95.255 kg (210 lb)     SpO2: 100% 97% 97% 100%    Wt Readings from Last 3 Encounters:  06/18/15 95.255 kg (210 lb)  06/03/15 94.711 kg (208 lb 12.8 oz)  06/01/15 92.987 kg (205 lb)     Intake/Output Summary (Last 24 hours) at 06/19/15 1551 Last data filed at 06/19/15 1500  Gross per 24 hour  Intake   2887 ml  Output   1876 ml  Net   1011 ml     Physical Exam  Awake Alert, Oriented X 3, No new F.N deficits, Normal affect [place].AT,PERRAL Supple Neck,No JVD, No cervical lymphadenopathy appreciated.  Symmetrical Chest wall movement, Good air movement bilaterally, CTAB RRR,No Gallops,Rubs or new Murmurs, No Parasternal Heave +ve B.Sounds, Abd Soft, No tenderness, No organomegaly appriciated, No rebound - guarding or rigidity. No Cyanosis, Clubbing or edema, wound with black eschar at right leg with fluctuance.      Data Review:    CBC  Recent Labs Lab 06/18/15 1649 06/19/15 0354  WBC 7.4 6.5  HGB 9.9* 8.0*  HCT 30.4* 25.2*  PLT 130* 108*  MCV 108.6* 108.6*  MCH 35.4* 34.5*  MCHC 32.6 31.7  RDW 15.3 15.5  LYMPHSABS 2.0 2.2  MONOABS 0.4 0.7  EOSABS  0.1 0.1  BASOSABS 0.0 0.0    Chemistries   Recent Labs Lab 06/18/15 1649 06/19/15 0354  NA 137 140  K 4.1 3.7  CL 90* 98*  CO2 34* 33*  GLUCOSE 187* 112*  BUN 17 18  CREATININE 0.99 0.93  CALCIUM 9.8 9.2  AST 33  --   ALT 24  --   ALKPHOS 79  --   BILITOT 0.4  --    ------------------------------------------------------------------------------------------------------------------ No results for input(s): CHOL, HDL, LDLCALC, TRIG, CHOLHDL, LDLDIRECT in the last 72 hours.  Lab Results  Component Value Date   HGBA1C 5.6 12/27/2014   ------------------------------------------------------------------------------------------------------------------ No results for input(s): TSH, T4TOTAL, T3FREE, THYROIDAB in the last 72 hours.  Invalid input(s): FREET3 ------------------------------------------------------------------------------------------------------------------ No results for input(s): VITAMINB12, FOLATE, FERRITIN, TIBC, IRON, RETICCTPCT in the last 72 hours.  Coagulation profile No results for input(s): INR, PROTIME in the last 168 hours.  No results for input(s): DDIMER in the last 72 hours.  Cardiac Enzymes No results for input(s): CKMB, TROPONINI, MYOGLOBIN in the last 168 hours.  Invalid input(s): CK ------------------------------------------------------------------------------------------------------------------    Component Value Date/Time   BNP 73.6 04/22/2015 1134   BNP 28.4 04/21/2015 1039    Inpatient Medications  Scheduled Meds: . enoxaparin (LOVENOX) injection  40 mg Subcutaneous Q24H  . escitalopram  10 mg Oral Daily  . fluconazole  200 mg Oral Daily  .  folic acid  1 mg Oral Daily  . hydrocerin  1 application Topical Daily  . lactulose  20 g Oral BID  . magnesium chloride  2 tablet Oral Daily  . mometasone-formoterol  2 puff Inhalation BID  . montelukast  10 mg Oral Daily  . multivitamin with minerals  1 tablet Oral Daily  . pantoprazole  40  mg Oral q morning - 10a  . piperacillin-tazobactam (ZOSYN)  IV  3.375 g Intravenous Q8H  . rOPINIRole  1 mg Oral QHS  . Sirolimus  0.5 mg Oral Daily  . sulfamethoxazole-trimethoprim  1 tablet Oral Q M,W,F  . tiotropium  18 mcg Inhalation Daily  . traZODone  150 mg Oral QHS  . vancomycin  1,250 mg Intravenous Q12H  . vitamin B-12  1,000 mcg Oral Daily   Continuous Infusions: . sodium chloride 75 mL/hr at 06/19/15 1536   PRN Meds:.albuterol, ALPRAZolam, linaclotide, oxyCODONE, polyethylene glycol  Micro Results Recent Results (from the past 240 hour(s))  Culture, blood (routine x 2)     Status: None (Preliminary result)   Collection Time: 06/18/15  4:22 PM  Result Value Ref Range Status   Specimen Description BLOOD RIGHT ANTECUBITAL  Final   Special Requests BOTTLES DRAWN AEROBIC AND ANAEROBIC 5CC  Final   Culture NO GROWTH < 24 HOURS  Final   Report Status PENDING  Incomplete  Culture, blood (routine x 2)     Status: None (Preliminary result)   Collection Time: 06/18/15  4:27 PM  Result Value Ref Range Status   Specimen Description BLOOD LEFT ANTECUBITAL  Final   Special Requests BOTTLES DRAWN AEROBIC AND ANAEROBIC 5CC  Final   Culture NO GROWTH < 24 HOURS  Final   Report Status PENDING  Incomplete  Urine culture     Status: None (Preliminary result)   Collection Time: 06/18/15  6:15 PM  Result Value Ref Range Status   Specimen Description URINE, CLEAN CATCH  Final   Special Requests NONE  Final   Culture TOO YOUNG TO READ  Final   Report Status PENDING  Incomplete    Radiology Reports Dg Tibia/fibula Right  06/18/2015  CLINICAL DATA:  [place] 2 weeks ago and cut leg behind metal plate. Persistent redness and swelling. EXAM: RIGHT TIBIA AND FIBULA - 2 VIEW COMPARISON:  06/01/2015 FINDINGS: The knee and ankle joints are maintained. Mild degenerative changes. No acute fracture or destructive bony changes. Stable scattered vascular calcifications, advanced for age. IMPRESSION: No  acute bony findings or destructive bony changes. No radiopaque foreign body. Electronically Signed   By: Marijo Sanes M.D.   On: 06/18/2015 16:46   Dg Tibia/fibula Right  06/01/2015  CLINICAL DATA:  Pain and hematoma in the distal right lower extremity after fall EXAM: RIGHT TIBIA AND FIBULA - 2 VIEW COMPARISON:  None. FINDINGS: There is soft tissue swelling and increased soft tissue density in the anterior distal right lower extremity at the level of the proximal right tibial shaft. No fracture or suspicious focal osseous lesion. No radiopaque foreign body. Vascular calcifications are seen throughout the soft tissues. IMPRESSION: No fracture. Swelling and increased density in the soft tissues anterior to the proximal right tibial shaft, consistent with the provided history of a soft tissue contusion. Electronically Signed   By: Ilona Sorrel M.D.   On: 06/01/2015 21:49    Time Spent in minutes  25   Velvet Bathe M.D on 06/19/2015 at 3:51 PM  Between 7am to 7pm - Pager - [phone]  After 7pm go to [email or web address] - password [facility]  [facility] -  Office  [phone]

## 2015-06-19 NOTE — Care Management Obs Status (Signed)
[place] NOTIFICATION   Patient Details  Name: Derek Shepherd MRN: [id number] Date of Birth: 1955-12-25   Medicare Observation Status Notification Given:  Yes    Nila Nephew, RN 06/19/2015, 10:50 AM

## 2015-06-20 DIAGNOSIS — L03115 Cellulitis of right lower limb: Secondary | ICD-10-CM | POA: Diagnosis not present

## 2015-06-20 LAB — CBC
HCT: 24.4 % — ABNORMAL LOW (ref 39.0–52.0)
Hemoglobin: 7.6 g/dL — ABNORMAL LOW (ref 13.0–17.0)
MCH: 34.1 pg — ABNORMAL HIGH (ref 26.0–34.0)
MCHC: 31.1 g/dL (ref 30.0–36.0)
MCV: 109.4 fL — ABNORMAL HIGH (ref 78.0–100.0)
PLATELETS: 104 10*3/uL — AB (ref 150–400)
RBC: 2.23 MIL/uL — AB (ref 4.22–5.81)
RDW: 15.7 % — AB (ref 11.5–15.5)
WBC: 6 10*3/uL (ref 4.0–10.5)

## 2015-06-20 MED ORDER — CEFPODOXIME PROXETIL 100 MG PO TABS: 100.0000 mg | ORAL_TABLET | Freq: Two times a day (BID) | ORAL | Status: DC

## 2015-06-20 NOTE — Progress Notes (Signed)
Pt discharged to home accomp by wife and son.

## 2015-06-20 NOTE — Discharge Summary (Signed)
Physician Discharge Summary  Derek Shepherd [id number] DOB: [birth year] DOA: 06/18/2015  PCP: Claretta Fraise, MD  Admit date: 06/18/2015 Discharge date: 06/20/2015  Time spent: > 35 minutes  Recommendations for Outpatient Follow-up:  1. Monitor hemoglobin levels, no active bleeding reported by patient. 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 He said the wound has been getting progressively larger with a central black scar and increasing pain and redness around it. He went to the ER 2 weeks ago and was asked to follow up with his PCP and surgery  Hospital Course:  RLE cellulitis - continue bactrim and will add vantin. Vantin for 4 more days to complete a treatment course of 7 days - hematoma evaluated by surgeon and felt not needed to be drained. Pt to f/u with ortho on d/c  Lactic acidosis - no fevers and patient dehydrated initially. Resolved with antibiotics and IVF's  Otherwise will continue home medication regimen on d/c  Procedures:  None  Consultations:  Orthopaedic surgery  Discharge Exam: Filed Vitals:   06/19/15 2120 06/20/15 0506  BP: 103/59 131/62  Pulse: 61 67  Temp: 98.3 F (36.8 C) 98 F (36.7 C)  Resp: 18 18    General: Pt in nad, alert and awake Cardiovascular: no cyanosis Respiratory: no increased wob, no wheezes  Discharge Instructions   Discharge Instructions    Call MD for:  difficulty breathing, headache or visual disturbances    Complete by:  As directed      Call MD for:  severe uncontrolled pain    Complete by:   As directed      Call MD for:  temperature >100.4    Complete by:  As directed      Diet - low sodium heart healthy    Complete by:  As directed      Discharge instructions    Complete by:  As directed   Please be sure to follow up with Dr. Lorin Mercy after hospital discharge.     Increase activity slowly    Complete by:  As directed           Current Discharge Medication List    START taking these medications   Details  cefpodoxime (VANTIN) 100 MG tablet Take 1 tablet (100 mg total) by mouth 2 (two) times daily. Qty: 8 tablet, Refills: 0      CONTINUE these medications which have NOT CHANGED   Details  albuterol (PROVENTIL) (2.5 MG/3ML) 0.083% nebulizer solution Take 3 mLs (2.5 mg total) by nebulization every 6 (six) hours as needed for wheezing or shortness of breath. Qty: 75 mL, Refills: 4    ALPRAZolam (XANAX) 0.25 MG tablet TAKE ONE-HALF TABLET BY MOUTH ONCE DAILY AT BEDTIME Qty: 15 tablet, Refills: 1    budesonide-formoterol (SYMBICORT) 160-4.5 MCG/ACT inhaler Inhale 2 puffs into the lungs every morning. Qty: 1 Inhaler, Refills: 2    Elastic Bandages & Supports (ANKLE LACE-UP BRACE) MISC Wear daily for two weeks then as needed ASO  Style brace Qty: 1 each, Refills:  0    escitalopram (LEXAPRO) 10 MG tablet Take 1 tablet (10 mg total) by mouth daily. For depression Qty: 30 tablet, Refills: 2    fluconazole (DIFLUCAN) 200 MG tablet Take 200 mg by mouth daily.    folic acid (FOLVITE) 1 MG tablet Take 1 mg by mouth daily.     lactulose (CHRONULAC) 10 GM/15ML solution Take 30 mLs (20 g total) by mouth 2 (two) times daily. For constipation Qty: 1892 mL, Refills: 5    levalbuterol (XOPENEX) 1.25 MG/0.5ML nebulizer solution Take 1.25 mg by nebulization 4 (four) times daily. Qty: 120 each, Refills: 12    Linaclotide (LINZESS) 72 MCG CAPS Take 1 tablet by mouth daily as needed (for constipation). Qty: 30 capsule, Refills: 0    Magnesium Chloride-Calcium 64-106 MG TBEC Take  2 tablets by mouth daily.     montelukast (SINGULAIR) 10 MG tablet Take 10 mg by mouth daily.    Multiple Vitamins tablet Take 1 tablet by mouth daily.     OXYGEN Inhale 3-5 L into the lungs continuous. Continuous 5 LPM when up and about 3 LPM constant and at night    pantoprazole (PROTONIX) 40 MG tablet TAKE ONE TABLET BY MOUTH EVERY MORNING Qty: 22 tablet, Refills: 0    Polyethyl Glycol-Propyl Glycol (SYSTANE PRESERVATIVE FREE) 0.4-0.3 % SOLN Place 1 drop into both eyes daily as needed. For dry eyes    polyethylene glycol powder (GLYCOLAX/MIRALAX) powder Take 17 g by mouth 2 (two) times daily as needed. Qty: 3350 g, Refills: 1    potassium chloride (MICRO-K) 10 MEQ CR capsule TAKE THREE CAPSULES BY MOUTH TWICE DAILY Qty: 180 capsule, Refills: 02    Probiotic Product (TRUBIOTICS PO) Take 1 capsule by mouth daily.     rOPINIRole (REQUIP) 1 MG tablet TAKE 1 TABLET BY MOUTH AT BEDTIME Qty: 30 tablet, Refills: 5    Sirolimus 0.5 MG TABS Take 0.5 mg by mouth daily.     Skin Protectants, Misc. (EUCERIN) cream Apply 1 application topically daily.    SPIRIVA HANDIHALER 18 MCG inhalation capsule Place 1 capsule (18 mcg total) into inhaler and inhale daily. Qty: 30 capsule, Refills: 5    sulfamethoxazole-trimethoprim (BACTRIM DS,SEPTRA DS) 800-160 MG tablet Take 1 tablet by mouth every Monday, Wednesday, and Friday.    torsemide (DEMADEX) 100 MG tablet Take 2 tablets (200 mg total) by mouth daily. Qty: 180 tablet, Refills: 3    traZODone (DESYREL) 150 MG tablet TAKE ONE OR TWO TABLETS BY MOUTH AT BEDTIME FOR SLEEP Qty: 60 tablet, Refills: 5    vitamin B-12 (CYANOCOBALAMIN) 1000 MCG tablet Take 1,000 mcg by mouth daily.        Allergies  Allergen Reactions  . Nsaids Shortness Of Breath and Swelling   Follow-up Information    Follow up with YATES,MARK C, MD In 1 week.   Specialty:  Orthopedic Surgery   Contact information:   [place]  [phone]        The results of significant diagnostics from this hospitalization (including imaging, microbiology, ancillary and laboratory) are listed below for reference.    Significant Diagnostic Studies: Dg Tibia/fibula Right  06/18/2015  CLINICAL DATA:  [place] 2 weeks ago and cut leg behind metal plate. Persistent redness and swelling. EXAM: RIGHT TIBIA AND FIBULA - 2 VIEW COMPARISON:  06/01/2015 FINDINGS: The knee and ankle joints are maintained. Mild degenerative changes. No acute fracture or destructive bony changes. Stable scattered vascular calcifications, advanced for age. IMPRESSION:  No acute bony findings or destructive bony changes. No radiopaque foreign body. Electronically Signed   By: Marijo Sanes M.D.   On: 06/18/2015 16:46   Dg Tibia/fibula Right  06/01/2015  CLINICAL DATA:  Pain and hematoma in the distal right lower extremity after fall EXAM: RIGHT TIBIA AND FIBULA - 2 VIEW COMPARISON:  None. FINDINGS: There is soft tissue swelling and increased soft tissue density in the anterior distal right lower extremity at the level of the proximal right tibial shaft. No fracture or suspicious focal osseous lesion. No radiopaque foreign body. Vascular calcifications are seen throughout the soft tissues. IMPRESSION: No fracture. Swelling and increased density in the soft tissues anterior to the proximal right tibial shaft, consistent with the provided history of a soft tissue contusion. Electronically Signed   By: Ilona Sorrel M.D.   On: 06/01/2015 21:49    Microbiology: Recent Results (from the past 240 hour(s))  Culture, blood (routine x 2)     Status: None (Preliminary result)   Collection Time: 06/18/15  4:22 PM  Result Value Ref Range Status   Specimen Description BLOOD RIGHT ANTECUBITAL  Final   Special Requests BOTTLES DRAWN AEROBIC AND ANAEROBIC 5CC  Final   Culture NO GROWTH < 24 HOURS  Final   Report Status PENDING  Incomplete  Culture, blood (routine x 2)      Status: None (Preliminary result)   Collection Time: 06/18/15  4:27 PM  Result Value Ref Range Status   Specimen Description BLOOD LEFT ANTECUBITAL  Final   Special Requests BOTTLES DRAWN AEROBIC AND ANAEROBIC 5CC  Final   Culture NO GROWTH < 24 HOURS  Final   Report Status PENDING  Incomplete  Urine culture     Status: None (Preliminary result)   Collection Time: 06/18/15  6:15 PM  Result Value Ref Range Status   Specimen Description URINE, CLEAN CATCH  Final   Special Requests NONE  Final   Culture TOO YOUNG TO READ  Final   Report Status PENDING  Incomplete     Labs: Basic Metabolic Panel:  Recent Labs Lab 06/18/15 1649 06/19/15 0354  NA 137 140  K 4.1 3.7  CL 90* 98*  CO2 34* 33*  GLUCOSE 187* 112*  BUN 17 18  CREATININE 0.99 0.93  CALCIUM 9.8 9.2   Liver Function Tests:  Recent Labs Lab 06/18/15 1649  AST 33  ALT 24  ALKPHOS 79  BILITOT 0.4  PROT 7.3  ALBUMIN 3.8   No results for input(s): LIPASE, AMYLASE in the last 168 hours. No results for input(s): AMMONIA in the last 168 hours. CBC:  Recent Labs Lab 06/18/15 1649 06/19/15 0354 06/20/15 0547  WBC 7.4 6.5 6.0  NEUTROABS 5.0 3.5  --   HGB 9.9* 8.0* 7.6*  HCT 30.4* 25.2* 24.4*  MCV 108.6* 108.6* 109.4*  PLT 130* 108* 104*   Cardiac Enzymes: No results for input(s): CKTOTAL, CKMB, CKMBINDEX, TROPONINI in the last 168 hours. BNP: BNP (last 3 results)  Recent Labs  02/13/15 1517 04/21/15 1039 04/22/15 1134  BNP CANCELED  219.9* 28.4 73.6    ProBNP (last 3 results) No results for input(s): PROBNP in the last 8760 hours.  CBG: No results for input(s): GLUCAP in the last 168 hours.     Signed:  Velvet Bathe MD.  [facility] 06/20/2015, 1:55 PM

## 2015-06-20 NOTE — Progress Notes (Signed)
Pt ready for DC.  Foam dressings given to wife to take home to cover RLL wound.  Pt is to follow up with Dr. Rodell Perna in 1 week, they understand.  Rx for antibiotic sent to [facility] and they understand to pick it up.  Pt has thermometer at home if needed to take temp.  Oxygen at home at PTA.

## 2015-06-20 NOTE — Consult Note (Signed)
Reason for Consult:right leg injury Referring Physician: Wendee Beavers MD  Derek Shepherd is an 60 y.o. male.  HPI: 60 yo male hit his leg on a metal pipe 3 -4 wks ago with hematoma and full thickness skin necrosis with hematoma.  3 wks batrim, Hx of lymphoma,diabetes,  other medical problems.    Past Medical History  Diagnosis Date  . Leukemia-lymphoma, T-cell, acute, HTLV-I-associated ([place])   . COPD (chronic obstructive pulmonary disease) ([place])   . DVT (deep venous thrombosis) ([place])   . Personal history of colonic  adenoma 01/22/2008  . Anemia   . Anxiety   . Arthritis   . Depression   . Gallstones   . Bowel obstruction ([facility])     constipation  . Adenomatous colon polyp     tubular  . Diverticulosis   . CHF (congestive heart failure) ([place])   . GERD (gastroesophageal reflux disease)   . 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 Cholecystectomy    . Lung surgery Left   . Exploratory laparotomy    . Hernia repair      x 2  . Colonoscopy w/ biopsies    . Bone marrow transplant  2011    Family History  Problem Relation Age of Onset  . Prostate cancer Brother   . Clotting disorder Mother   . Diabetes Father   . Diabetes Brother     x 3  . Diabetes Sister     x 3  . Diabetes Maternal Aunt   . Heart attack Mother   . Heart attack Father     Social History:  reports that he quit smoking about 21 years ago. His smoking use included Cigarettes. He has never used smokeless tobacco. He reports that he drinks alcohol. He reports that he does not use illicit drugs.  Allergies:  Allergies  Allergen Reactions  . Nsaids Shortness Of Breath and Swelling    Medications: I have reviewed the patient's current medications.  Results for orders placed or performed during the hospital encounter of 06/18/15 (from the past 48 hour(s))  Culture, blood (routine x 2)     Status: None (Preliminary  result)   Collection Time: 06/18/15  4:22 PM  Result Value Ref Range   Specimen Description BLOOD RIGHT ANTECUBITAL    Special Requests BOTTLES DRAWN AEROBIC AND ANAEROBIC 5CC    Culture NO GROWTH < 24 HOURS    Report Status PENDING   Culture, blood (routine x 2)     Status: None (Preliminary result)   Collection Time: 06/18/15  4:27 PM  Result Value Ref Range   Specimen Description BLOOD LEFT ANTECUBITAL    Special Requests BOTTLES DRAWN AEROBIC AND ANAEROBIC 5CC    Culture NO GROWTH < 24 HOURS    Report Status PENDING   I-Stat CG4 Lactic Acid, ED  (not at [facility])     Status: Abnormal   Collection Time: 06/18/15  4:48 PM  Result Value Ref Range   Lactic Acid, Venous 2.93 (HH) 0.5 - 2.0 mmol/L   Comment NOTIFIED PHYSICIAN   Comprehensive metabolic panel     Status: Abnormal   Collection Time: 06/18/15  4:49 PM  Result Value Ref Range   Sodium 137 135 - 145 mmol/L   Potassium 4.1 3.5 - 5.1 mmol/L   Chloride 90 (L) 101 - 111 mmol/L   CO2 34 (H) 22 - 32 mmol/L   Glucose,  Bld 187 (H) 65 - 99 mg/dL   BUN 17 6 - 20 mg/dL   Creatinine, Ser 0.99 0.61 - 1.24 mg/dL   Calcium 9.8 8.9 - 10.3 mg/dL   Total Protein 7.3 6.5 - 8.1 g/dL   Albumin 3.8 3.5 - 5.0 g/dL   AST 33 15 - 41 U/L   ALT 24 17 - 63 U/L   Alkaline Phosphatase 79 38 - 126 U/L   Total Bilirubin 0.4 0.3 - 1.2 mg/dL   GFR calc non Af Amer >60 >60 mL/min   GFR calc Af Amer >60 >60 mL/min    Comment: (NOTE) The eGFR has been calculated using the CKD EPI equation. This calculation has not been validated in all clinical situations. eGFR's persistently <60 mL/min signify possible Chronic Kidney Disease.    Anion gap 13 5 - 15  CBC with Differential     Status: Abnormal   Collection Time: 06/18/15  4:49 PM  Result Value Ref Range   WBC 7.4 4.0 - 10.5 K/uL   RBC 2.80 (L) 4.22 - 5.81 MIL/uL   Hemoglobin 9.9 (L) 13.0 - 17.0 g/dL   HCT 30.4 (L) 39.0 - 52.0 %   MCV 108.6 (H) 78.0 - 100.0 fL   MCH 35.4 (H) 26.0 - 34.0 pg   MCHC  32.6 30.0 - 36.0 g/dL   RDW 15.3 11.5 - 15.5 %   Platelets 130 (L) 150 - 400 K/uL   Neutrophils Relative % 66 %   Neutro Abs 5.0 1.7 - 7.7 K/uL   Lymphocytes Relative 27 %   Lymphs Abs 2.0 0.7 - 4.0 K/uL   Monocytes Relative 6 %   Monocytes Absolute 0.4 0.1 - 1.0 K/uL   Eosinophils Relative 1 %   Eosinophils Absolute 0.1 0.0 - 0.7 K/uL   Basophils Relative 0 %   Basophils Absolute 0.0 0.0 - 0.1 K/uL  Urinalysis, Routine w reflex microscopic (not at [facility])     Status: Abnormal   Collection Time: 06/18/15  6:15 PM  Result Value Ref Range   Color, Urine YELLOW YELLOW   APPearance CLEAR CLEAR   Specific Gravity, Urine 1.011 1.005 - 1.030   pH 6.5 5.0 - 8.0   Glucose, UA NEGATIVE NEGATIVE mg/dL   Hgb urine dipstick SMALL (A) NEGATIVE   Bilirubin Urine NEGATIVE NEGATIVE   Ketones, ur NEGATIVE NEGATIVE mg/dL   Protein, ur NEGATIVE NEGATIVE mg/dL   Nitrite NEGATIVE NEGATIVE   Leukocytes, UA MODERATE (A) NEGATIVE  Urine culture     Status: None (Preliminary result)   Collection Time: 06/18/15  6:15 PM  Result Value Ref Range   Specimen Description URINE, CLEAN CATCH    Special Requests NONE    Culture TOO YOUNG TO READ    Report Status PENDING   Urine microscopic-add on     Status: Abnormal   Collection Time: 06/18/15  6:15 PM  Result Value Ref Range   Squamous Epithelial / LPF NONE SEEN NONE SEEN   WBC, UA 6-30 0 - 5 WBC/hpf   RBC / HPF 0-5 0 - 5 RBC/hpf   Bacteria, UA RARE (A) NONE SEEN   Casts HYALINE CASTS (A) NEGATIVE  I-Stat CG4 Lactic Acid, ED  (not at [facility])     Status: None   Collection Time: 06/18/15  7:49 PM  Result Value Ref Range   Lactic Acid, Venous 0.94 0.5 - 2.0 mmol/L  Basic metabolic panel     Status: Abnormal   Collection Time: 06/19/15  3:54  AM  Result Value Ref Range   Sodium 140 135 - 145 mmol/L   Potassium 3.7 3.5 - 5.1 mmol/L   Chloride 98 (L) 101 - 111 mmol/L   CO2 33 (H) 22 - 32 mmol/L   Glucose, Bld 112 (H) 65 - 99 mg/dL   BUN 18 6 - 20 mg/dL    Creatinine, Ser 0.93 0.61 - 1.24 mg/dL   Calcium 9.2 8.9 - 10.3 mg/dL   GFR calc non Af Amer >60 >60 mL/min   GFR calc Af Amer >60 >60 mL/min    Comment: (NOTE) The eGFR has been calculated using the CKD EPI equation. This calculation has not been validated in all clinical situations. eGFR's persistently <60 mL/min signify possible Chronic Kidney Disease.    Anion gap 9 5 - 15  CBC WITH DIFFERENTIAL     Status: Abnormal   Collection Time: 06/19/15  3:54 AM  Result Value Ref Range   WBC 6.5 4.0 - 10.5 K/uL   RBC 2.32 (L) 4.22 - 5.81 MIL/uL   Hemoglobin 8.0 (L) 13.0 - 17.0 g/dL   HCT 25.2 (L) 39.0 - 52.0 %   MCV 108.6 (H) 78.0 - 100.0 fL   MCH 34.5 (H) 26.0 - 34.0 pg   MCHC 31.7 30.0 - 36.0 g/dL   RDW 15.5 11.5 - 15.5 %   Platelets 108 (L) 150 - 400 K/uL    Comment: CONSISTENT WITH PREVIOUS RESULT   Neutrophils Relative % 53 %   Neutro Abs 3.5 1.7 - 7.7 K/uL   Lymphocytes Relative 34 %   Lymphs Abs 2.2 0.7 - 4.0 K/uL   Monocytes Relative 11 %   Monocytes Absolute 0.7 0.1 - 1.0 K/uL   Eosinophils Relative 2 %   Eosinophils Absolute 0.1 0.0 - 0.7 K/uL   Basophils Relative 0 %   Basophils Absolute 0.0 0.0 - 0.1 K/uL  CBC     Status: Abnormal   Collection Time: 06/20/15  5:47 AM  Result Value Ref Range   WBC 6.0 4.0 - 10.5 K/uL   RBC 2.23 (L) 4.22 - 5.81 MIL/uL   Hemoglobin 7.6 (L) 13.0 - 17.0 g/dL   HCT 24.4 (L) 39.0 - 52.0 %   MCV 109.4 (H) 78.0 - 100.0 fL   MCH 34.1 (H) 26.0 - 34.0 pg   MCHC 31.1 30.0 - 36.0 g/dL   RDW 15.7 (H) 11.5 - 15.5 %   Platelets 104 (L) 150 - 400 K/uL    Comment: CONSISTENT WITH PREVIOUS RESULT    Dg Tibia/fibula Right  06/18/2015  CLINICAL DATA:  [place] 2 weeks ago and cut leg behind metal plate. Persistent redness and swelling. EXAM: RIGHT TIBIA AND FIBULA - 2 VIEW COMPARISON:  06/01/2015 FINDINGS: The knee and ankle joints are maintained. Mild degenerative changes. No acute fracture or destructive bony changes. Stable scattered vascular  calcifications, advanced for age. IMPRESSION: No acute bony findings or destructive bony changes. No radiopaque foreign body. Electronically Signed   By: Marijo Sanes M.D.   On: 06/18/2015 16:46    Review of Systems  Constitutional: Positive for weight loss and malaise/fatigue. Negative for fever and chills.  HENT: Negative for ear discharge, sore throat and tinnitus.   Eyes: Negative for photophobia.  Cardiovascular: Positive for leg swelling. Negative for chest pain and orthopnea.  Gastrointestinal: Negative for vomiting.  Endo/Heme/Allergies:       Positive lymphoma  Psychiatric/Behavioral: Negative for depression and substance abuse.   Blood pressure 131/62, pulse 67, temperature 98  F (36.7 C), temperature source Oral, resp. rate 18, height 5' 6"  (1.676 m), weight 95.255 kg (210 lb), SpO2 99 %. Physical Exam  Constitutional: He is oriented to person, place, and time.  Eyes: Pupils are equal, round, and reactive to light.  Neck: Normal range of motion.  Cardiovascular: Normal rate.   Respiratory: Effort normal.  GI: Soft.  Musculoskeletal:  Right LE edema, old scar from previous lesion, healed slowly.   Full thickness skin necrosis 2 cm with underlying hematoma.   Neurological: He is alert and oriented to person, place, and time.  Skin: Skin is warm.  Psychiatric: He has a normal mood and affect. His behavior is normal. Judgment normal.    Assessment/Plan: Right leg hematoma with full thickness skin loss. This has old blood under it, no pus , does not need drainage.   If it does get infected then debridement of full thickness skin loss and VAC will be needed. He has some venous stasis and will need to keep leg propped up when he is not walking. He can follow up with me as outpt.   OK to restart septra prophylactically as before. followup one week     My cell [phone] Yoni Lobos C 06/20/2015, 7:18 AM

## 2015-06-21 LAB — URINE CULTURE

## 2015-06-22 MED FILL — Sirolimus Tab 0.5 MG: ORAL | Qty: 1 | Status: AC

## 2015-06-22 MED FILL — Sirolimus Tab 0.5 MG: ORAL | Qty: 1 | Status: CN

## 2015-06-23 LAB — CULTURE, BLOOD (ROUTINE X 2)
CULTURE: NO GROWTH
Culture: NO GROWTH

## 2015-07-01 ENCOUNTER — Other Ambulatory Visit: Payer: Self-pay | Admitting: Family Medicine

## 2015-07-02 ENCOUNTER — Telehealth: Payer: Self-pay

## 2015-07-02 NOTE — Telephone Encounter (Signed)
Insurance prior authorized Symbicort through 03/06/2016

## 2015-07-07 ENCOUNTER — Telehealth: Payer: Self-pay | Admitting: Family Medicine

## 2015-07-07 NOTE — Telephone Encounter (Signed)
Please contact the patient to clarify glucose, pulse ox. See what he needs

## 2015-07-07 NOTE — Telephone Encounter (Signed)
Patient states that his BS was a 167. And his pulse was 130. His o2 level is around 94. Patient states he is having sob and I advised patient he will need to be seen. Appointment scheduled for tomorrow at 9:55 with [facility].

## 2015-07-08 ENCOUNTER — Encounter (INDEPENDENT_AMBULATORY_CARE_PROVIDER_SITE_OTHER): Payer: Self-pay

## 2015-07-08 ENCOUNTER — Encounter: Payer: Self-pay | Admitting: Family Medicine

## 2015-07-08 ENCOUNTER — Ambulatory Visit (INDEPENDENT_AMBULATORY_CARE_PROVIDER_SITE_OTHER): Payer: Medicare HMO | Admitting: Family Medicine

## 2015-07-08 ENCOUNTER — Ambulatory Visit (INDEPENDENT_AMBULATORY_CARE_PROVIDER_SITE_OTHER): Payer: Medicare HMO

## 2015-07-08 VITALS — BP 132/73 | HR 107 | Temp 98.4°F | Ht 66.0 in | Wt 211.4 lb

## 2015-07-08 DIAGNOSIS — R0602 Shortness of breath: Secondary | ICD-10-CM | POA: Diagnosis not present

## 2015-07-08 DIAGNOSIS — J441 Chronic obstructive pulmonary disease with (acute) exacerbation: Secondary | ICD-10-CM | POA: Diagnosis not present

## 2015-07-08 DIAGNOSIS — R0789 Other chest pain: Secondary | ICD-10-CM

## 2015-07-08 MED ORDER — TORSEMIDE 100 MG PO TABS: 200.0000 mg | ORAL_TABLET | Freq: Every day | ORAL | Status: DC

## 2015-07-08 NOTE — Progress Notes (Signed)
Quick Note:  Your chest x-ray looked normal. Thanks, WS. ______ 

## 2015-07-08 NOTE — Progress Notes (Signed)
Subjective:  Patient ID: Derek Shepherd, male    DOB: [birth year]  Age: 60 y.o. MRN: [id number]  CC: Shortness of Breath and Hyperglycemia   HPI Derek Shepherd presents for blood glucose up to 160. No hypoglycemic spells. Having DOE for walking around the house on 5 liters. However, he states his pulse ox is aalways in the mid 90s even when dyspneic. Denies fever, chills, sweats. No cough. WEaring )2 24/7. Uses 3 liters at rest and 5 liters when active. No dyspnea at rest. Denies edema.  Pt. Has been taking his torsemide 200 mg BID. Wonders why pharmacy wrote 200 daily on bottle.Needs updated scrip.   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   He has past surgical history that includes Cholecystectomy; Lung surgery (Left); Exploratory laparotomy; Hernia repair; Colonoscopy w/ biopsies; and Bone marrow transplant (2011).   His family history includes Clotting disorder in his mother; Diabetes in his brother, father, maternal aunt, and sister; Heart attack in his father and mother; Prostate cancer in his brother.He reports that he quit smoking about 22 years ago. His smoking use included Cigarettes. He has never used smokeless tobacco. He reports that he drinks alcohol. He reports that he does not use illicit drugs.    ROS Review of Systems  Constitutional: Negative for fever, chills, diaphoresis and unexpected weight change.  HENT: Negative for congestion, hearing loss, rhinorrhea and sore throat.   Eyes: Negative  for visual disturbance.  Respiratory: Positive for chest tightness and shortness of breath. Negative for cough.   Cardiovascular: Negative for chest pain.  Gastrointestinal: Negative for abdominal pain, diarrhea and constipation.  Genitourinary: Negative for dysuria and flank pain.  Musculoskeletal: Negative for joint swelling and arthralgias.  Skin: Negative for rash.  Neurological: Positive for weakness. Negative for dizziness and headaches.  Psychiatric/Behavioral: Negative for sleep disturbance and dysphoric mood.    Objective:  BP 132/73 mmHg  Pulse 107  Temp(Src) 98.4 F (36.9 C) (Oral)  Ht 5' 6"  (1.676 m)  Wt 211 lb 6.4 oz (95.89 kg)  BMI 34.14 kg/m2  SpO2 98%  BP Readings from Last 3 Encounters:  07/08/15 132/73  06/20/15 130/65  06/03/15 147/82    Wt Readings from Last 3 Encounters:  07/08/15 211 lb 6.4 oz (95.89 kg)  06/18/15 210 lb (95.255 kg)  06/03/15 208 lb 12.8 oz (94.711 kg)     Physical Exam  Constitutional: He is oriented to person, place, and time. He appears well-developed and well-nourished. No distress.  HENT:  Head: Normocephalic and atraumatic.  Right Ear: External ear normal.  Left Ear: External ear normal.  Nose: Nose normal.  Mouth/Throat: Oropharynx is clear and moist.  Eyes: Conjunctivae and EOM are normal. Pupils are equal, round, and reactive to light.  Neck: Normal range of motion. Neck supple. No thyromegaly present.  Cardiovascular: Normal rate, regular rhythm and normal heart sounds.   No murmur heard. Pulmonary/Chest: Effort normal. No respiratory distress. He has no wheezes. He has no rales.  Decreased exp. Phase   Abdominal: Soft. Bowel sounds are normal. He exhibits no distension. There is no tenderness.  Lymphadenopathy:    He has no cervical adenopathy.  Neurological: He is alert and oriented to person, place, and time. He has normal reflexes.  Skin: Skin is warm and dry.  Psychiatric: He has a normal mood and affect. His  behavior is normal. Thought content normal.     Lab Results  Component Value Date   WBC 6.0 06/20/2015   HGB 7.6* 06/20/2015   HCT 24.4* 06/20/2015   PLT 104* 06/20/2015   GLUCOSE 112* 06/19/2015   CHOL 214* 12/01/2014   TRIG 290* 12/01/2014   HDL 48 12/01/2014   LDLCALC 108* 12/01/2014   ALT 24 06/18/2015   AST 33 06/18/2015   NA 140 06/19/2015   K 3.7 06/19/2015   CL 98* 06/19/2015   CREATININE 0.93 06/19/2015   BUN 18 06/19/2015   CO2 33* 06/19/2015   PSA 1.0 02/15/2013   INR 1.00 06/02/2015   HGBA1C 5.6 12/27/2014      Assessment & Plan:   Derek Shepherd was seen today for shortness of breath and hyperglycemia.  Diagnoses and all orders for this visit:  SOB (shortness of breath) -     DG Chest 2 View -     EKG 12-Lead -     D-dimer, quantitative (not at [facility]) -     CBC with Differential/Platelet -     CMP14+EGFR  COPD exacerbation ([place])  Other orders -     torsemide (DEMADEX) 100 MG tablet; Take 2 tablets (200 mg total) by mouth daily.    I have discontinued Derek Shepherd's sulfamethoxazole-trimethoprim. I am also having him maintain his Multiple Vitamins, Polyethyl Glycol-Propyl Glycol, folic acid, vitamin I-51, albuterol, Magnesium Chloride-Calcium, OXYGEN, SPIRIVA HANDIHALER, eucerin, polyethylene glycol powder, Sirolimus, montelukast, fluconazole, Probiotic Product (TRUBIOTICS PO), traZODone, lactulose, levalbuterol, Ankle Lace-Up Brace, escitalopram, potassium chloride, pantoprazole, linaclotide, ALPRAZolam, rOPINIRole, cefpodoxime, SYMBICORT, and torsemide.  Meds ordered this encounter  Medications  . torsemide (DEMADEX) 100 MG tablet    Sig: Take 2 tablets (200 mg total) by mouth daily.    Dispense:  180 tablet    Refill:  3     Follow-up: No Follow-up on file.  Derek Shepherd, M.D.

## 2015-07-09 ENCOUNTER — Emergency Department (HOSPITAL_COMMUNITY)
Admission: EM | Admit: 2015-07-09 | Discharge: 2015-07-09 | Disposition: A | Discharge status: Home / Self Care | Payer: Medicare HMO | Attending: Emergency Medicine | Admitting: Emergency Medicine

## 2015-07-09 ENCOUNTER — Encounter (HOSPITAL_COMMUNITY): Payer: Self-pay

## 2015-07-09 ENCOUNTER — Other Ambulatory Visit: Payer: Self-pay | Admitting: *Deleted

## 2015-07-09 ENCOUNTER — Emergency Department (HOSPITAL_COMMUNITY): Payer: Medicare HMO

## 2015-07-09 ENCOUNTER — Ambulatory Visit: Payer: Medicare HMO

## 2015-07-09 ENCOUNTER — Ambulatory Visit (INDEPENDENT_AMBULATORY_CARE_PROVIDER_SITE_OTHER): Payer: Medicare HMO | Admitting: Family

## 2015-07-09 VITALS — BP 110/73 | HR 84 | Ht 66.0 in | Wt 210.4 lb

## 2015-07-09 DIAGNOSIS — M1990 Unspecified osteoarthritis, unspecified site: Secondary | ICD-10-CM | POA: Insufficient documentation

## 2015-07-09 DIAGNOSIS — Z79899 Other long term (current) drug therapy: Secondary | ICD-10-CM | POA: Diagnosis not present

## 2015-07-09 DIAGNOSIS — Z86718 Personal history of other venous thrombosis and embolism: Secondary | ICD-10-CM

## 2015-07-09 DIAGNOSIS — Z792 Long term (current) use of antibiotics: Secondary | ICD-10-CM | POA: Insufficient documentation

## 2015-07-09 DIAGNOSIS — Z86010 Personal history of colonic polyps: Secondary | ICD-10-CM | POA: Insufficient documentation

## 2015-07-09 DIAGNOSIS — R0602 Shortness of breath: Secondary | ICD-10-CM

## 2015-07-09 DIAGNOSIS — I509 Heart failure, unspecified: Secondary | ICD-10-CM | POA: Insufficient documentation

## 2015-07-09 DIAGNOSIS — J441 Chronic obstructive pulmonary disease with (acute) exacerbation: Secondary | ICD-10-CM

## 2015-07-09 DIAGNOSIS — R2241 Localized swelling, mass and lump, right lower limb: Secondary | ICD-10-CM | POA: Diagnosis not present

## 2015-07-09 DIAGNOSIS — D649 Anemia, unspecified: Secondary | ICD-10-CM | POA: Insufficient documentation

## 2015-07-09 DIAGNOSIS — J9611 Chronic respiratory failure with hypoxia: Secondary | ICD-10-CM

## 2015-07-09 DIAGNOSIS — F419 Anxiety disorder, unspecified: Secondary | ICD-10-CM | POA: Diagnosis not present

## 2015-07-09 DIAGNOSIS — Z856 Personal history of leukemia: Secondary | ICD-10-CM | POA: Diagnosis not present

## 2015-07-09 DIAGNOSIS — Z7951 Long term (current) use of inhaled steroids: Secondary | ICD-10-CM | POA: Diagnosis not present

## 2015-07-09 DIAGNOSIS — R791 Abnormal coagulation profile: Secondary | ICD-10-CM

## 2015-07-09 DIAGNOSIS — K5900 Constipation, unspecified: Secondary | ICD-10-CM | POA: Insufficient documentation

## 2015-07-09 DIAGNOSIS — Z87891 Personal history of nicotine dependence: Secondary | ICD-10-CM | POA: Insufficient documentation

## 2015-07-09 DIAGNOSIS — F329 Major depressive disorder, single episode, unspecified: Secondary | ICD-10-CM | POA: Insufficient documentation

## 2015-07-09 DIAGNOSIS — Z86018 Personal history of other benign neoplasm: Secondary | ICD-10-CM | POA: Insufficient documentation

## 2015-07-09 DIAGNOSIS — Z9481 Bone marrow transplant status: Secondary | ICD-10-CM | POA: Insufficient documentation

## 2015-07-09 DIAGNOSIS — R7989 Other specified abnormal findings of blood chemistry: Secondary | ICD-10-CM

## 2015-07-09 DIAGNOSIS — R0789 Other chest pain: Secondary | ICD-10-CM | POA: Diagnosis not present

## 2015-07-09 DIAGNOSIS — C9101 Acute lymphoblastic leukemia, in remission: Secondary | ICD-10-CM | POA: Diagnosis not present

## 2015-07-09 HISTORY — DX: Unspecified asthma, uncomplicated: J45.909

## 2015-07-09 LAB — CMP14+EGFR
ALT: 27 IU/L (ref 0–44)
AST: 33 IU/L (ref 0–40)
Albumin/Globulin Ratio: 2 (ref 1.2–2.2)
Albumin: 4.7 g/dL (ref 3.5–5.5)
Alkaline Phosphatase: 82 IU/L (ref 39–117)
BUN / CREAT RATIO: 29 — AB (ref 9–20)
BUN: 22 mg/dL (ref 6–24)
Bilirubin Total: <0.2 mg/dL (ref 0.0–1.2)
CALCIUM: 9.7 mg/dL (ref 8.7–10.2)
CO2: 32 mmol/L — ABNORMAL HIGH (ref 18–29)
CREATININE: 0.76 mg/dL (ref 0.76–1.27)
Chloride: 89 mmol/L — ABNORMAL LOW (ref 96–106)
GFR, EST AFRICAN AMERICAN: 117 mL/min/{1.73_m2} (ref >59)
GFR, EST NON AFRICAN AMERICAN: 101 mL/min/{1.73_m2} (ref >59)
GLOBULIN, TOTAL: 2.4 g/dL (ref 1.5–4.5)
Glucose: 154 mg/dL — ABNORMAL HIGH (ref 65–99)
Potassium: 3.5 mmol/L (ref 3.5–5.2)
SODIUM: 138 mmol/L (ref 134–144)
TOTAL PROTEIN: 7.1 g/dL (ref 6.0–8.5)

## 2015-07-09 LAB — CBC WITH DIFFERENTIAL/PLATELET
BASOS: 0 %
Basophils Absolute: 0 10*3/uL (ref 0.0–0.2)
EOS (ABSOLUTE): 0 10*3/uL (ref 0.0–0.4)
EOS: 1 %
HEMATOCRIT: 30.7 % — AB (ref 37.5–51.0)
Hemoglobin: 10.4 g/dL — ABNORMAL LOW (ref 12.6–17.7)
Immature Grans (Abs): 0 10*3/uL (ref 0.0–0.1)
Immature Granulocytes: 0 %
LYMPHS ABS: 1.9 10*3/uL (ref 0.7–3.1)
Lymphs: 24 %
MCH: 36.2 pg — AB (ref 26.6–33.0)
MCHC: 33.9 g/dL (ref 31.5–35.7)
MCV: 107 fL — AB (ref 79–97)
MONOS ABS: 0.6 10*3/uL (ref 0.1–0.9)
Monocytes: 8 %
Neutrophils Absolute: 5.3 10*3/uL (ref 1.4–7.0)
Neutrophils: 67 %
Platelets: 127 10*3/uL — ABNORMAL LOW (ref 150–379)
RBC: 2.87 x10E6/uL — AB (ref 4.14–5.80)
RDW: 14.9 % (ref 12.3–15.4)
WBC: 7.8 10*3/uL (ref 3.4–10.8)

## 2015-07-09 LAB — BASIC METABOLIC PANEL
ANION GAP: 12 (ref 5–15)
BUN: 18 mg/dL (ref 6–20)
CALCIUM: 9.8 mg/dL (ref 8.9–10.3)
CHLORIDE: 91 mmol/L — AB (ref 101–111)
CO2: 31 mmol/L (ref 22–32)
Creatinine, Ser: 1 mg/dL (ref 0.61–1.24)
GFR calc non Af Amer: >60 mL/min (ref >60)
GLUCOSE: 143 mg/dL — AB (ref 65–99)
POTASSIUM: 3.5 mmol/L (ref 3.5–5.1)
Sodium: 134 mmol/L — ABNORMAL LOW (ref 135–145)

## 2015-07-09 LAB — I-STAT TROPONIN, ED: TROPONIN I, POC: 0.02 ng/mL (ref 0.00–0.08)

## 2015-07-09 LAB — CBC
HEMATOCRIT: 28.2 % — AB (ref 39.0–52.0)
HEMOGLOBIN: 9.2 g/dL — AB (ref 13.0–17.0)
MCH: 35.2 pg — AB (ref 26.0–34.0)
MCHC: 32.6 g/dL (ref 30.0–36.0)
MCV: 108 fL — AB (ref 78.0–100.0)
Platelets: 100 10*3/uL — ABNORMAL LOW (ref 150–400)
RBC: 2.61 MIL/uL — AB (ref 4.22–5.81)
RDW: 14.6 % (ref 11.5–15.5)
WBC: 7.6 10*3/uL (ref 4.0–10.5)

## 2015-07-09 LAB — BRAIN NATRIURETIC PEPTIDE: B Natriuretic Peptide: 59 pg/mL (ref 0.0–100.0)

## 2015-07-09 LAB — D-DIMER, QUANTITATIVE: D-DIMER: 2.79 mg/L FEU — ABNORMAL HIGH (ref 0.00–0.49)

## 2015-07-09 MED ORDER — IOPAMIDOL (ISOVUE-370) INJECTION 76%
INTRAVENOUS | Status: AC
  Administered 2015-07-09: 100 mL
  Filled 2015-07-09: qty 100

## 2015-07-09 MED ORDER — PREDNISONE 20 MG PO TABS: ORAL_TABLET | ORAL | Status: DC

## 2015-07-09 MED ORDER — DOXYCYCLINE HYCLATE 100 MG PO CAPS: 100.0000 mg | ORAL_CAPSULE | Freq: Two times a day (BID) | ORAL | Status: DC

## 2015-07-09 NOTE — ED Provider Notes (Signed)
CSN: [id number]     Arrival date & time 07/09/15  1556 History   First MD Initiated Contact with Patient 07/09/15 1622     Chief Complaint  Patient presents with  . Shortness of Breath     (Consider location/radiation/quality/duration/timing/severity/associated sxs/prior Treatment) HPI The patient is a chronically ill 60 year old male with a history of CHF, COPD, remote history of DVT, who presents after being referred by his primary care doctor for having a positive d-dimer. He recently suffered a wound to his right shin, which developed a large hematoma and subsequently became infected. He is currently on antibiotics for the cellulitis. Over the past 2 weeks he has developed shortness of breath and pleuritic right-sided chest pain. He says the pain currently is a 4 out of 10. He had a d-dimer collected yesterday that was positive at 2.97. He had a d-dimer checked in February as well, it was higher then, and no pulmonary embolism was found. He previously was anticoagulated for a DVT, but that had to be discontinued due to GI bleeding.  \He reports that he has had a decrease in exercise tolerance over the last several weeks, he has been getting out of breath and tachycardic whenever he walks any distance, however he says his oxygen saturations have been okay.  Past Medical History  Diagnosis Date  . Leukemia-lymphoma, T-cell, acute, HTLV-I-associated ([place])   . COPD (chronic obstructive pulmonary disease) ([place])   . DVT (deep venous thrombosis) ([place])   . Personal history of colonic  adenoma 01/22/2008  . Anemia   . Anxiety   . Arthritis   . Depression   . Gallstones   . Bowel obstruction (HCC)     constipation  . Adenomatous colon polyp     tubular  . Diverticulosis   . CHF (congestive heart failure) ([place])   . GERD (gastroesophageal reflux disease)   . 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 Asthma    Past Surgical History  Procedure  Laterality Date  . Cholecystectomy    . Lung surgery Left   . Exploratory laparotomy    . Hernia repair      x 2  . Colonoscopy w/ biopsies    . Bone marrow transplant  2011   Family History  Problem Relation Age of Onset  . Prostate cancer Brother   . Clotting disorder Mother   . Diabetes Father   . Diabetes Brother     x 3  . Diabetes Sister     x 3  . Diabetes Maternal Aunt   . Heart attack Mother   . Heart attack Father    Social History  Substance Use Topics  . Smoking status: Former Smoker    Types: Cigarettes    Quit date: 07/13/1993  . Smokeless tobacco: Never Used  . Alcohol Use: 0.0 oz/week    0 Standard drinks or equivalent per week     Comment: socially    Review of Systems  Constitutional: Negative for chills and fatigue.  Respiratory: Positive for cough, shortness of breath and wheezing.   Cardiovascular: Positive for chest pain and leg swelling.  Genitourinary: Negative for dysuria, frequency, enuresis and genital sores.  All other systems reviewed and are negative.     Allergies  Nsaids  Home Medications   Prior to Admission medications   Medication Sig Start Date End Date Taking? Authorizing Provider  albuterol (PROVENTIL) (2.5 MG/3ML) 0.083% nebulizer solution Take 3 mLs (2.5 mg total) by  nebulization every 6 (six) hours as needed for wheezing or shortness of breath. 05/24/14  Yes Claretta Fraise, MD  ALPRAZolam Duanne Moron) 0.25 MG tablet TAKE ONE-HALF TABLET BY MOUTH ONCE DAILY AT BEDTIME 06/02/15  Yes Claretta Fraise, MD  aMILoride (MIDAMOR) 5 MG tablet Take 1 tablet by mouth daily. 07/03/15  Yes Historical Provider, MD  amoxicillin (AMOXIL) 500 MG capsule Take 1 capsule by mouth 3 (three) times daily. 07/01/15  Yes Historical Provider, MD  cefpodoxime (VANTIN) 200 MG tablet Take 0.5 tablets by mouth 2 (two) times daily. 06/20/15  Yes Historical Provider, MD  Elastic Bandages & Supports (ANKLE LACE-UP BRACE) MISC Wear daily for two weeks then as  needed ASO  Style brace 05/05/15  Yes Claretta Fraise, MD  escitalopram (LEXAPRO) 10 MG tablet Take 1 tablet (10 mg total) by mouth daily. For depression 05/05/15  Yes Claretta Fraise, MD  fluconazole (DIFLUCAN) 200 MG tablet Take 200 mg by mouth daily. Reported on 07/08/2015   Yes Historical Provider, MD  folic acid (FOLVITE) 1 MG tablet Take 1 mg by mouth daily.  02/11/14  Yes Historical Provider, MD  HYDROmorphone (DILAUDID) 2 MG tablet Take 2 mg by mouth every 6 (six) hours as needed for severe pain.   Yes Historical Provider, MD  levalbuterol (XOPENEX) 1.25 MG/0.5ML nebulizer solution Take 1.25 mg by nebulization 4 (four) times daily. 04/21/15  Yes Claretta Fraise, MD  Linaclotide Rolan Lipa) 72 MCG CAPS Take 1 tablet by mouth daily as needed (for constipation). 05/28/15  Yes Claretta Fraise, MD  Magnesium Chloride-Calcium 64-106 MG TBEC Take 2 tablets by mouth daily.    Yes Historical Provider, MD  montelukast (SINGULAIR) 10 MG tablet Take 10 mg by mouth daily. 03/23/15  Yes Historical Provider, MD  Multiple Vitamins tablet Take 1 tablet by mouth daily.    Yes Historical Provider, MD  NEFAZODONE HCL PO Take 100 mg by mouth daily.    Yes Historical Provider, MD  OXYGEN Inhale 3-5 L into the lungs continuous. Continuous 5 LPM when up and about 3 LPM constant and at night   Yes Historical Provider, MD  Polyethyl Glycol-Propyl Glycol (SYSTANE PRESERVATIVE FREE) 0.4-0.3 % SOLN Place 1 drop into both eyes daily as needed. For dry eyes   Yes Historical Provider, MD  potassium chloride (MICRO-K) 10 MEQ CR capsule TAKE THREE CAPSULES BY MOUTH TWICE DAILY 05/18/15  Yes Claretta Fraise, MD  Probiotic Product (TRUBIOTICS PO) Take 1 capsule by mouth daily.    Yes Historical Provider, MD  rOPINIRole (REQUIP) 1 MG tablet TAKE 1 TABLET BY MOUTH AT BEDTIME 06/09/15  Yes Claretta Fraise, MD  Sirolimus 0.5 MG TABS Take 0.5 mg by mouth daily.  03/31/15  Yes Historical Provider, MD  Skin Protectants, Misc. (EUCERIN) cream Apply 1  application topically daily.   Yes Historical Provider, MD  SPIRIVA HANDIHALER 18 MCG inhalation capsule Place 1 capsule (18 mcg total) into inhaler and inhale daily. 12/18/14  Yes Claretta Fraise, MD  SYMBICORT 160-4.5 MCG/ACT inhaler INHALE TWO PUFFS BY MOUTH IN THE MORNING 07/01/15  Yes Claretta Fraise, MD  torsemide (DEMADEX) 100 MG tablet Take 2 tablets (200 mg total) by mouth daily. 07/08/15  Yes Claretta Fraise, MD  vitamin B-12 (CYANOCOBALAMIN) 1000 MCG tablet Take 1,000 mcg by mouth daily.    Yes Historical Provider, MD  doxycycline (VIBRAMYCIN) 100 MG capsule Take 1 capsule (100 mg total) by mouth 2 (two) times daily. One po bid x 7 days 07/09/15   Leata Mouse, MD  lactulose [facility])  10 GM/15ML solution Take 30 mLs (20 g total) by mouth 2 (two) times daily. For constipation 04/21/15   Claretta Fraise, MD  polyethylene glycol powder [facility]) powder Take 17 g by mouth 2 (two) times daily as needed. Patient taking differently: Take 17 g by mouth 2 (two) times daily as needed for moderate constipation.  01/27/15   Claretta Fraise, MD  predniSONE (DELTASONE) 20 MG tablet 2 tabs po daily x 4 days 07/09/15   Leata Mouse, MD   BP 109/58 mmHg  Pulse 72  Resp 17  SpO2 100% Physical Exam  Constitutional: He is oriented to person, place, and time.  Chronically ill-appearing  HENT:  Head: Normocephalic and atraumatic.  Eyes: Pupils are equal, round, and reactive to light.  Neck: Normal range of motion. Neck supple. No JVD present.  Cardiovascular: Normal rate, regular rhythm and normal heart sounds.   Pulmonary/Chest: No respiratory distress. He exhibits no tenderness.  Prolonged expiratory phase Scattered wheezes in the bases  Abdominal: Soft. He exhibits no distension. There is no tenderness.  Well-healed midline laparotomy scar  Musculoskeletal:  Healing wound to the right shin. The black eschar in the center of a circumscribed wound with indurated, raised edges.  Neurological: He is alert  and oriented to person, place, and time.  Skin: Skin is warm and dry.  Psychiatric: He has a normal mood and affect.  Nursing note and vitals reviewed.   ED Course  Procedures (including critical care time) Labs Review Labs Reviewed  BASIC METABOLIC PANEL - Abnormal; Notable for the following:    Sodium 134 (*)    Chloride 91 (*)    Glucose, Bld 143 (*)    All other components within normal limits  CBC - Abnormal; Notable for the following:    RBC 2.61 (*)    Hemoglobin 9.2 (*)    HCT 28.2 (*)    MCV 108.0 (*)    MCH 35.2 (*)    Platelets 100 (*)    All other components within normal limits  BRAIN NATRIURETIC PEPTIDE  I-STAT TROPOININ, ED    Imaging Review Dg Chest 2 View  07/09/2015  CLINICAL DATA:  Pt c/o SOB, increased heart rate, general chest soreness and fluctuations in his glucose levels x 1 month. Pt states he began taking a new lasix medication that has the side effects that he is experiencing and informed his PCP of this yesterday. PCP placed pt on a heart monitor for 48 hours and obtained a CXR yesterday. Pt sent to ER for elevated D Dimer today. Pt currently on 3 liters of oxygen at home but has increased it to 5 liters due to SOB on exertion. Hx left lung surgery with only 33% usage of lungs, COPD and CHF. EXAM: CHEST  2 VIEW COMPARISON:  07/08/2015 FINDINGS: Cardiac silhouette is normal in size. No mediastinal or hilar masses or evidence of adenopathy. Right internal jugular dual-lumen central venous catheter tip projects in the right atrium, without change. Mild scarring in the left upper lobe lingula. Otherwise clear lungs. No pleural effusion or pneumothorax. Stable changes from left thoracic surgery. Bony thorax is demineralized. IMPRESSION: No acute cardiopulmonary disease. Electronically Signed   By: Lajean Manes M.D.   On: 07/09/2015 16:53   Dg Chest 2 View  07/08/2015  CLINICAL DATA:  Shortness of Breath EXAM: CHEST  2 VIEW COMPARISON:  04/21/2015 FINDINGS:  Cardiac shadow is stable. Postoperative changes are again seen on the left. A dual-lumen chest wall port is again  seen and stable. The lungs are well aerated bilaterally. No focal infiltrate or sizable effusion is seen. IMPRESSION: No acute abnormality noted. Electronically Signed   By: Inez Catalina M.D.   On: 07/08/2015 14:43   Ct Angio Chest Pe W/cm &/or Wo Cm  07/09/2015  CLINICAL DATA:  Short of breath for 1 month.  History of DVT. EXAM: CT ANGIOGRAPHY CHEST WITH CONTRAST TECHNIQUE: Multidetector CT imaging of the chest was performed using the standard protocol during bolus administration of intravenous contrast. Multiplanar CT image reconstructions and MIPs were obtained to evaluate the vascular anatomy. CONTRAST:  80 mL of Isovue 370 intravenous contrast COMPARISON:  Current chest radiograph.  Chest CTA, 04/22/2015 FINDINGS: Angiographic study: No evidence of a pulmonary embolus. Great vessels are normal in caliber. No aortic dissection. No significant aortic plaque. Branch vessels are widely patent. Neck base and axilla:  No mass or adenopathy. Mediastinum and hila: Heart normal size and configuration. Minimal coronary artery calcifications. No mediastinal or hilar masses or adenopathy. Lungs and pleura: Patchy areas of hazy ground-glass lung opacity are noted. This is seen bilaterally. This may be due to small airways disease and vascular shunting. Infection is possible. No evidence of pulmonary edema. No lung mass or suspicious nodule. No pleural effusion or pneumothorax. Small calcified granuloma in the right lower lobe. Limited upper abdomen: Nonobstructing stone in the upper pole the left kidney. Low-density mass consistent with cysts from the posterior margin of the right lobe of the liver. No acute findings. Musculoskeletal: Multiple old rib fractures. Wires encircle portions of the left fifth and sixth ribs. No osteoblastic or osteolytic lesions. Review of the MIP images confirms the above findings.  IMPRESSION: 1. No evidence of a pulmonary embolus. 2. Patchy areas of ground-glass opacity in both lungs. Findings suggest small airways disease. Multifocal pneumonia is possible but felt less likely. No pulmonary edema. Electronically Signed   By: Lajean Manes M.D.   On: 07/09/2015 18:51   I have personally reviewed and evaluated these images and lab results as part of my medical decision-making.   EKG Interpretation None      MDM   Final diagnoses:  SOB (shortness of breath)  COPD exacerbation (HCC)    Patient presents shortness of breath and positive d-dimer collected outpatient. He does have some cough, worsening shortness of breath, and pleuritic sounding chest pain, PE is on the differential. EKG unchanged from previous, negative troponin, story not consistent with acute coronary syndrome with his symptoms progressive over weeks. CT PE study obtained to rule out pulmonary embolism, and there is no evidence of PE. His d-dimer is likely elevated secondary to the hematoma and cellulitic infection on his right shin. He does not have any increase in oxygen requirement here. CT does show inflammatory changes in bilateral lungs, and with the slow progression of his symptoms, associated with shortness of breath, as well as increased cough, likely this is COPD exacerbation, do not suspect overt pneumonia, as he has no white count, no fever, and did not have a very rapid onset of his symptoms. Will treat him with doxycycline, steroids, and have him follow-up with pulmonology at [facility] within 1-2 weeks.      Leata Mouse, MD 07/09/15 [place], MD 07/09/15 [phone]

## 2015-07-09 NOTE — Discharge Instructions (Signed)

## 2015-07-09 NOTE — ED Notes (Signed)
MD at bedside. 

## 2015-07-09 NOTE — Progress Notes (Signed)
   Subjective:    Patient ID: Derek Shepherd, male    DOB: February 22, 1956, 60 y.o.   MRN: [id number]  HPI Pt presents to the office with elevated D Dimer. Pt was seen in the office yesterday for SOB. PT has had an elevated D Dimer in February and was sent to the ED where he had a negative CT. Pt has a history of COPD, CHF,  ALL in remission, and multiple DVT's. PT states he dose not take any anticoagulation, because of GI bleed.  PT states he continues to have SOB, but states he only has "30 % of lung function because of his chemotherapy and radiation". PT states he has not seen his Pulmonologist's in 3 months and can not recall his next follow up. Chest X-ray yesterday was WNL. PT's states his SOB is worse over the last month and can not walk long distances without getting SOB. Pt denies any chest pain.    Review of Systems  Constitutional: Positive for fatigue.  Respiratory: Positive for shortness of breath.   Skin: Positive for pallor.  All other systems reviewed and are negative.      Objective:   Physical Exam  Constitutional: He is oriented to person, place, and time. He appears well-developed and well-nourished. No distress.  Eyes: Pupils are equal, round, and reactive to light. Right eye exhibits no discharge. Left eye exhibits no discharge.  Neck: Normal range of motion. Neck supple. No thyromegaly present.  Cardiovascular: Normal rate, regular rhythm, normal heart sounds and intact distal pulses.   No murmur heard. Pulmonary/Chest: Effort normal. No respiratory distress. He has no wheezes.  Diminished breath sounds  Abdominal: Soft. Bowel sounds are normal. He exhibits no distension. There is no tenderness.  Musculoskeletal: Normal range of motion. He exhibits no edema or tenderness.  Neurological: He is alert and oriented to person, place, and time.  Skin: Skin is warm and dry. No rash noted. No erythema. There is pallor.  Psychiatric: He has a normal mood and affect. His  behavior is normal. Judgment and thought content normal.  Vitals reviewed.     BP 110/73 mmHg  Pulse 84  Ht 5\' 6"  (1.676 m)  Wt 210 lb 6 oz (95.425 kg)  BMI 33.97 kg/m2  SpO2 98%     Assessment & Plan:  1. Chronic respiratory failure with hypoxia (HCC)  2. COPD exacerbation ([place])  3. ALL (acute lymphoid leukemia) in remission ([place])  4. Positive D dimer  5. History of DVT (deep vein thrombosis)   PT told to go to ED to rule out PE with patient's history of DVT, ALL , and Positive D Dimer.  Discussed in length positive D dimer and risks of PE. Pt having increased SOB over the last two weeks. Vital signs stable and O2 sat WNL. RTO in 1 week with Dr. Bernell List, FNP

## 2015-07-09 NOTE — ED Notes (Signed)
Pt was moved to D34 by this EMT.  Pt was then taken to x-ray and from there they took him to D34.

## 2015-07-09 NOTE — ED Notes (Addendum)
Pt here with c/o SOB onset about 2 weeks ago, hx of PE and COPD. He reports CP only with deep inspiration. Pt talking in clear complete sentences. Pt was told to come here by PCP for elevated d-dimer and he denies being on any blood thinners.

## 2015-07-09 NOTE — ED Notes (Signed)
Tiffany in main lab states she can add on BNP at this time.

## 2015-07-09 NOTE — Patient Instructions (Signed)

## 2015-07-09 NOTE — ED Notes (Signed)
Pt  Arrived wearing a heart monitor and on 3L [place].

## 2015-07-09 NOTE — ED Notes (Signed)
This RN informed tech Leonel Ramsay at [facility] to take this patient to the next available room to be evaluated by physician due to his SOB and elevated d-dimer per pt.

## 2015-07-10 ENCOUNTER — Other Ambulatory Visit: Payer: Self-pay

## 2015-07-10 NOTE — Addendum Note (Signed)
Addended by: Marin Olp on: 07/10/2015 11:18 AM   Modules accepted: Orders

## 2015-07-17 ENCOUNTER — Telehealth: Payer: Self-pay | Admitting: Family Medicine

## 2015-07-21 NOTE — Telephone Encounter (Signed)
Pt notified of results Verbalizes understanding 

## 2015-07-23 ENCOUNTER — Encounter: Payer: Self-pay | Admitting: Family Medicine

## 2015-07-23 ENCOUNTER — Encounter: Payer: Self-pay | Admitting: Family

## 2015-07-30 ENCOUNTER — Other Ambulatory Visit: Payer: Self-pay | Admitting: Family Medicine

## 2015-09-03 ENCOUNTER — Ambulatory Visit (INDEPENDENT_AMBULATORY_CARE_PROVIDER_SITE_OTHER): Payer: Medicare HMO | Admitting: Family

## 2015-09-03 ENCOUNTER — Encounter: Payer: Self-pay | Admitting: Family

## 2015-09-03 VITALS — BP 126/83 | HR 104 | Temp 98.5°F | Ht 66.0 in | Wt 205.0 lb

## 2015-09-03 DIAGNOSIS — E1165 Type 2 diabetes mellitus with hyperglycemia: Secondary | ICD-10-CM | POA: Diagnosis not present

## 2015-09-03 LAB — BAYER DCA HB A1C WAIVED: HB A1C: 6.4 % (ref <7.0)

## 2015-09-03 MED ORDER — METFORMIN HCL 500 MG PO TABS: 500.0000 mg | ORAL_TABLET | Freq: Two times a day (BID) | ORAL | Status: DC

## 2015-09-03 NOTE — Progress Notes (Signed)
Subjective:    Patient ID: Derek Shepherd, male    DOB: 03/28/1955, 60 y.o.   MRN: [id number]  Pt presents to the office today with elevated blood glucose levels.  Hyperglycemia This is a new problem. The current episode started 1 to 4 weeks ago. The problem occurs constantly. The problem has been unchanged. Associated symptoms include a visual change. Pertinent negatives include no chills, coughing, diaphoresis or myalgias.  Diabetes He presents for his initial diabetic visit. He has type 2 diabetes mellitus. His disease course has been worsening. Hypoglycemia symptoms include dizziness. Pertinent negatives for hypoglycemia include no confusion or pallor. Associated symptoms include polydipsia and visual change. Pertinent negatives for diabetes include no blurred vision and no foot paresthesias. Pertinent negatives for hypoglycemia complications include no blackouts and no hospitalization. Symptoms are worsening. Pertinent negatives for diabetic complications include no CVA, heart disease, nephropathy or peripheral neuropathy. Risk factors for coronary artery disease include dyslipidemia, male sex and sedentary lifestyle. Current diabetic treatment includes diet. He is compliant with treatment some of the time. He is following a generally unhealthy diet. His breakfast blood glucose range is generally 180-200 mg/dl. An ACE inhibitor/angiotensin II receptor blocker is not being taken. Eye exam is current.      Review of Systems  Constitutional: Negative.  Negative for chills and diaphoresis.  HENT: Negative.   Eyes: Negative for blurred vision.  Respiratory: Negative.  Negative for cough.   Cardiovascular: Negative.   Gastrointestinal: Negative.   Endocrine: Positive for polydipsia.  Genitourinary: Negative.   Musculoskeletal: Negative.  Negative for myalgias.  Skin: Negative for pallor.  Neurological: Positive for dizziness.  Hematological: Negative.   Psychiatric/Behavioral: Negative.   Negative for confusion.  All other systems reviewed and are negative.      Objective:   Physical Exam  Constitutional: He is oriented to person, place, and time. He appears well-developed and well-nourished. No distress.  HENT:  Head: Normocephalic.  Eyes: Pupils are equal, round, and reactive to light. Right eye exhibits no discharge. Left eye exhibits no discharge.  Neck: Normal range of motion. Neck supple. No thyromegaly present.  Cardiovascular: Normal rate, regular rhythm, normal heart sounds and intact distal pulses.   No murmur heard. Pulmonary/Chest: Effort normal and breath sounds normal. No respiratory distress. He has no wheezes.  Diminished breath sounds   Abdominal: Soft. Bowel sounds are normal. He exhibits no distension. There is no tenderness.  3L of O2   Musculoskeletal: Normal range of motion. He exhibits no edema or tenderness.  Neurological: He is alert and oriented to person, place, and time.  Skin: Skin is warm and dry. No rash noted. No erythema.  Psychiatric: He has a normal mood and affect. His behavior is normal. Judgment and thought content normal.  Vitals reviewed.   BP 126/83 mmHg  Pulse 104  Temp(Src) 98.5 F (36.9 C) (Oral)  Ht '5\' 6"'$  (1.676 m)  Wt 205 lb (92.987 kg)  BMI 33.10 kg/m2       Assessment & Plan:  1. Type 2 diabetes mellitus with hyperglycemia, without long-term current use of insulin (HCC) -Pt started on metfromin 500 mg BID -Low carb diet discussed -PT to make appt with Tammy for new diabetic education -RTO in 1 month - Bayer DCA Hb A1c Waived - Microalbumin / creatinine urine ratio - CMP14+EGFR - metFORMIN (GLUCOPHAGE) 500 MG tablet; Take 1 tablet (500 mg total) by mouth 2 (two) times daily with a meal.  Dispense: 180 tablet; Refill: 3  Evelina Dun, FNP

## 2015-09-03 NOTE — Patient Instructions (Signed)
Diabetes Mellitus and Food It is important for you to manage your blood sugar (glucose) level. Your blood glucose level can be greatly affected by what you eat. Eating healthier foods in the appropriate amounts throughout the day at about the same time each day will help you control your blood glucose level. It can also help slow or prevent worsening of your diabetes mellitus. Healthy eating may even help you improve the level of your blood pressure and reach or maintain a healthy weight.  General recommendations for healthful eating and cooking habits include:  Eating meals and snacks regularly. Avoid going long periods of time without eating to lose weight.  Eating a diet that consists mainly of plant-based foods, such as fruits, vegetables, nuts, legumes, and whole grains.  Using low-heat cooking methods, such as baking, instead of high-heat cooking methods, such as deep frying. Work with your dietitian to make sure you understand how to use the Nutrition Facts information on food labels. HOW CAN FOOD AFFECT ME? Carbohydrates Carbohydrates affect your blood glucose level more than any other type of food. Your dietitian will help you determine how many carbohydrates to eat at each meal and teach you how to count carbohydrates. Counting carbohydrates is important to keep your blood glucose at a healthy level, especially if you are using insulin or taking certain medicines for diabetes mellitus. Alcohol Alcohol can cause sudden decreases in blood glucose (hypoglycemia), especially if you use insulin or take certain medicines for diabetes mellitus. Hypoglycemia can be a life-threatening condition. Symptoms of hypoglycemia (sleepiness, dizziness, and disorientation) are similar to symptoms of having too much alcohol.  If your health care provider has given you approval to drink alcohol, do so in moderation and use the following guidelines:  Women should not have more than one drink per day, and men  should not have more than two drinks per day. One drink is equal to:  12 oz of beer.  5 oz of wine.  1 oz of hard liquor.  Do not drink on an empty stomach.  Keep yourself hydrated. Have water, diet soda, or unsweetened iced tea.  Regular soda, juice, and other mixers might contain a lot of carbohydrates and should be counted. WHAT FOODS ARE NOT RECOMMENDED? As you make food choices, it is important to remember that all foods are not the same. Some foods have fewer nutrients per serving than other foods, even though they might have the same number of calories or carbohydrates. It is difficult to get your body what it needs when you eat foods with fewer nutrients. Examples of foods that you should avoid that are high in calories and carbohydrates but low in nutrients include:  Trans fats (most processed foods list trans fats on the Nutrition Facts label).  Regular soda.  Juice.  Candy.  Sweets, such as cake, pie, doughnuts, and cookies.  Fried foods. WHAT FOODS CAN I EAT? Eat nutrient-rich foods, which will nourish your body and keep you healthy. The food you should eat also will depend on several factors, including:  The calories you need.  The medicines you take.  Your weight.  Your blood glucose level.  Your blood pressure level.  Your cholesterol level. You should eat a variety of foods, including:  Protein.  Lean cuts of meat.  Proteins low in saturated fats, such as fish, egg whites, and beans. Avoid processed meats.  Fruits and vegetables.  Fruits and vegetables that may help control blood glucose levels, such as apples, mangoes, and   yams.  Dairy products.  Choose fat-free or low-fat dairy products, such as milk, yogurt, and cheese.  Grains, bread, pasta, and rice.  Choose whole grain products, such as multigrain bread, whole oats, and brown rice. These foods may help control blood pressure.  Fats.  Foods containing healthful fats, such as nuts,  avocado, olive oil, canola oil, and fish. DOES EVERYONE WITH DIABETES MELLITUS HAVE THE SAME MEAL PLAN? Because every person with diabetes mellitus is different, there is not one meal plan that works for everyone. It is very important that you meet with a dietitian who will help you create a meal plan that is just right for you.   This information is not intended to replace advice given to you by your health care provider. Make sure you discuss any questions you have with your health care provider.   Document Released: 11/18/2004 Document Revised: 03/14/2014 Document Reviewed: 01/18/2013 Elsevier Interactive Patient Education 2016 [employer]  

## 2015-09-04 ENCOUNTER — Other Ambulatory Visit: Payer: Self-pay | Admitting: Family

## 2015-09-04 LAB — MICROALBUMIN / CREATININE URINE RATIO
Creatinine, Urine: 59.7 mg/dL
MICROALB/CREAT RATIO: 110.7 mg/g{creat} — AB (ref 0.0–30.0)
MICROALBUM., U, RANDOM: 66.1 ug/mL

## 2015-09-04 LAB — CMP14+EGFR
A/G RATIO: 1.9 (ref 1.2–2.2)
ALT: 38 IU/L (ref 0–44)
AST: 33 IU/L (ref 0–40)
Albumin: 4.3 g/dL (ref 3.5–5.5)
Alkaline Phosphatase: 89 IU/L (ref 39–117)
BUN/Creatinine Ratio: 27 — ABNORMAL HIGH (ref 9–20)
BUN: 27 mg/dL — AB (ref 6–24)
CALCIUM: 9.7 mg/dL (ref 8.7–10.2)
CHLORIDE: 83 mmol/L — AB (ref 96–106)
CO2: 29 mmol/L (ref 18–29)
Creatinine, Ser: 1.01 mg/dL (ref 0.76–1.27)
GFR calc Af Amer: 95 mL/min/{1.73_m2} (ref >59)
GFR, EST NON AFRICAN AMERICAN: 82 mL/min/{1.73_m2} (ref >59)
GLUCOSE: 157 mg/dL — AB (ref 65–99)
Globulin, Total: 2.3 g/dL (ref 1.5–4.5)
POTASSIUM: 3.6 mmol/L (ref 3.5–5.2)
Sodium: 133 mmol/L — ABNORMAL LOW (ref 134–144)
TOTAL PROTEIN: 6.6 g/dL (ref 6.0–8.5)

## 2015-09-04 MED ORDER — LISINOPRIL 5 MG PO TABS: 5.0000 mg | ORAL_TABLET | Freq: Every day | ORAL | Status: DC

## 2015-09-15 ENCOUNTER — Other Ambulatory Visit: Payer: Self-pay | Admitting: Family Medicine

## 2015-09-17 ENCOUNTER — Other Ambulatory Visit: Payer: Medicare HMO

## 2015-09-17 DIAGNOSIS — N289 Disorder of kidney and ureter, unspecified: Secondary | ICD-10-CM

## 2015-09-18 LAB — BMP8+EGFR
BUN / CREAT RATIO: 19 (ref 9–20)
BUN: 22 mg/dL (ref 6–24)
CHLORIDE: 93 mmol/L — AB (ref 96–106)
CO2: 27 mmol/L (ref 18–29)
Calcium: 9.3 mg/dL (ref 8.7–10.2)
Creatinine, Ser: 1.15 mg/dL (ref 0.76–1.27)
GFR calc non Af Amer: 70 mL/min/{1.73_m2} (ref >59)
GFR, EST AFRICAN AMERICAN: 81 mL/min/{1.73_m2} (ref >59)
GLUCOSE: 137 mg/dL — AB (ref 65–99)
Potassium: 4.2 mmol/L (ref 3.5–5.2)
SODIUM: 140 mmol/L (ref 134–144)

## 2015-10-10 ENCOUNTER — Ambulatory Visit (INDEPENDENT_AMBULATORY_CARE_PROVIDER_SITE_OTHER): Payer: Medicare HMO | Admitting: Nurse Practitioner

## 2015-10-10 ENCOUNTER — Encounter: Payer: Self-pay | Admitting: Nurse Practitioner

## 2015-10-10 VITALS — Temp 97.3°F | Ht 66.0 in | Wt 214.0 lb

## 2015-10-10 DIAGNOSIS — I951 Orthostatic hypotension: Secondary | ICD-10-CM

## 2015-10-10 DIAGNOSIS — R079 Chest pain, unspecified: Secondary | ICD-10-CM

## 2015-10-10 NOTE — Patient Instructions (Signed)

## 2015-10-10 NOTE — Progress Notes (Signed)
   Subjective:    Patient ID: Derek Shepherd, male    DOB: 04/15/1955, 60 y.o.   MRN: [id number]  HPI Patient comes in with his wife today c/o feeling dizzy  When he gets up from sitting position. He was started on lisinopril on 09/04/15 for protein in urine. That is about the time that dizziness started.  Says  That he feels his heart racing and has been havng some chest pain. Has occurred several times in the last several days.CAn last up to an hour. Chest pain just occurs when at rest.  [facility] cardiologist last week and checked out ok. But was not having any chest pain wen he saw cardiologist. He was tachycardiac at appointment but cardiologist did not want to start him on new med.  Review of Systems  Constitutional: Negative.   HENT: Negative.   Respiratory: Positive for shortness of breath.   Cardiovascular: Positive for chest pain and palpitations.  Gastrointestinal: Negative.   Genitourinary: Negative.   Neurological: Positive for dizziness.  Psychiatric/Behavioral: Negative.   All other systems reviewed and are negative.      Objective:   Physical Exam  Constitutional: He is oriented to person, place, and time. He appears well-developed and well-nourished. No distress.  Cardiovascular: Normal heart sounds.   tachycardiac  Pulmonary/Chest: Effort normal and breath sounds normal.  Os via nasal cannula at 5L  Neurological: He is alert and oriented to person, place, and time.  Skin: Skin is warm and dry.  Psychiatric: He has a normal mood and affect. His behavior is normal. Judgment and thought content normal.     BP 103/64   Pulse (!) 102   Temp 97.3 F (36.3 C) (Oral)   Ht 5\' 6"  (1.676 m)   Wt 214 lb (97.1 kg)   BMI 34.54 kg/m   EKG- Kerry Hough, FNP     Assessment & Plan:  1. Chest pain, unspecified chest pain type Rest Return to cardiologist or go to ER if chest pain continues  2. Orthostatic hypotension Stop lisinopril RTO prn  Mary-Margaret  Hassell Done, FNP

## 2015-10-13 ENCOUNTER — Telehealth: Payer: Self-pay | Admitting: Nurse Practitioner

## 2015-10-13 NOTE — Telephone Encounter (Signed)
Spoke with pt's wife regarding labs She is requesting that we give copy of labs Informed that we are happy to give copies as requested She verballizes understanding

## 2015-10-19 ENCOUNTER — Other Ambulatory Visit: Payer: Self-pay | Admitting: Family Medicine

## 2015-10-21 ENCOUNTER — Encounter: Payer: Self-pay | Admitting: Family Medicine

## 2015-10-21 ENCOUNTER — Ambulatory Visit (INDEPENDENT_AMBULATORY_CARE_PROVIDER_SITE_OTHER): Payer: Medicare HMO | Admitting: Family Medicine

## 2015-10-21 VITALS — BP 122/75 | HR 92 | Temp 98.6°F | Ht 66.0 in | Wt 213.2 lb

## 2015-10-21 DIAGNOSIS — R7303 Prediabetes: Secondary | ICD-10-CM | POA: Diagnosis not present

## 2015-10-21 DIAGNOSIS — Z949 Transplanted organ and tissue status, unspecified: Secondary | ICD-10-CM

## 2015-10-21 DIAGNOSIS — D649 Anemia, unspecified: Secondary | ICD-10-CM | POA: Diagnosis not present

## 2015-10-21 DIAGNOSIS — R0602 Shortness of breath: Secondary | ICD-10-CM | POA: Diagnosis not present

## 2015-10-21 DIAGNOSIS — C9101 Acute lymphoblastic leukemia, in remission: Secondary | ICD-10-CM

## 2015-10-21 DIAGNOSIS — N179 Acute kidney failure, unspecified: Secondary | ICD-10-CM

## 2015-10-21 DIAGNOSIS — J9611 Chronic respiratory failure with hypoxia: Secondary | ICD-10-CM

## 2015-10-21 NOTE — Progress Notes (Signed)
Subjective:  Patient ID: Derek Shepherd, male    DOB: 1955/08/12  Age: 60 y.o. MRN: [id number]  CC: Hospitalization Follow-up (anemia)   HPI Derek Shepherd presents for Patient was hospitalized at [facility] for 2 days and was discharged 8 days ago. He has been diagnosed with anemia. Possibly related to blood loss. He notes that before hospitalization he was having increasing heartburn and was not taking his pantoprazole. He was given 2 blood transfusions due to a hemoglobin that dropped to 6.7. He was also treated for acute renal insufficiency with hydration. Since discharge he is back to his baseline. That does include being oxygen dependent for his COPD and shortness of breath. His blood sugar was checked regularly during his hospitalization To Be between 101 20. He Checked It This Morning at Home and It Was 115. Currently he says he does feel better than he did when he went to the hospital. He has no idea where the bleeding might come from or if there was even any blood loss. He does not melena he denies hematochezia other than what he describes as a fairly mild heartburn he's had no signs of bleeding. He is a bone marrow transplant patient for ALL from 2011.   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   He has a past surgical history that includes Cholecystectomy; Lung surgery (Left); Exploratory laparotomy; Hernia repair; Colonoscopy w/ biopsies; and Bone marrow transplant (2011).   His family history includes  Clotting disorder in his mother; Diabetes in his brother, father, maternal aunt, and sister; Heart attack in his father and mother; Prostate cancer in his brother.He reports that he quit smoking about 22 years ago. His smoking use included Cigarettes. He has never used smokeless tobacco. He reports that he drinks alcohol. He reports that he does not use drugs.    ROS Review of Systems  Constitutional: Negative for chills, diaphoresis and fever.  HENT: Negative for congestion, ear pain, hearing loss and rhinorrhea.   Respiratory: Positive for cough, shortness of breath (baseline - using O2, [place]) and wheezing.   Cardiovascular: Negative for chest pain.  Gastrointestinal: Positive for abdominal pain (heartburn see HPI).  Musculoskeletal: Negative for arthralgias and myalgias.  Skin: Negative for rash.  Neurological: Negative for weakness and headaches.    Objective:  BP 122/75 (BP Location: Right Arm, Patient Position: Sitting, Cuff Size: Large)   Pulse 92   Temp 98.6 F (37 C) (Oral)   Ht _0  (1.676 m)   Wt 213 lb 3.2 oz (96.7 kg)   SpO2 95% Comment: 5 LPM O2  BMI 34.41 kg/m   BP Readings from Last 3 Encounters:  10/21/15 122/75  09/03/15 126/83  07/09/15 109/61    Wt Readings from Last 3 Encounters:  10/21/15 213 lb 3.2 oz (96.7 kg)  10/10/15 214 lb (97.1 kg)  09/03/15 205 lb (93 kg)     Physical Exam  Constitutional: He appears well-developed and well-nourished.  HENT:  Head: Normocephalic and atraumatic.  Right Ear: Tympanic membrane and external ear normal. No decreased hearing is noted.  Left Ear: Tympanic membrane and external ear normal. No decreased hearing is noted.  Mouth/Throat: No oropharyngeal exudate or posterior oropharyngeal erythema.  Eyes: Pupils are equal, round, and reactive to light.  Neck: Normal range of motion. Neck supple.  Cardiovascular: Normal rate and regular rhythm.   No murmur heard. Pulmonary/Chest: No respiratory distress. He has  wheezes.  Abdominal: Soft. Bowel sounds are normal. He exhibits no mass. There is no tenderness.  Vitals reviewed.    Lab Results  Component Value Date   WBC 7.6 07/09/2015   HGB 9.2 (L) 07/09/2015   HCT 28.2 (L) 07/09/2015   PLT 100 (L) 07/09/2015   GLUCOSE 137 (H) 09/17/2015   CHOL 214 (H) 12/01/2014   TRIG 290 (H) 12/01/2014   HDL 48 12/01/2014   LDLCALC 108 (H) 12/01/2014   ALT 38 09/03/2015   AST 33 09/03/2015   NA 140 09/17/2015   K 4.2 09/17/2015   CL 93 (L) 09/17/2015   CREATININE 1.15 09/17/2015   BUN 22 09/17/2015   CO2 27 09/17/2015   PSA 1.0 02/15/2013   INR 1.00 06/02/2015   HGBA1C 5.6 12/27/2014    Dg Chest 2 View  Result Date: 07/09/2015 CLINICAL DATA:  Pt c/o SOB, increased heart rate, general chest soreness and fluctuations in his glucose levels x 1 month. Pt states he began taking a new lasix medication that has the side effects that he is experiencing and informed his PCP of this yesterday. PCP placed pt on a heart monitor for 48 hours and obtained a CXR yesterday. Pt sent to ER for elevated D Dimer today. Pt currently on 3 liters of oxygen at home but has increased it to 5 liters due to SOB on exertion. Hx left lung surgery with only 33% usage of lungs, COPD and CHF. EXAM: CHEST  2 VIEW COMPARISON:  07/08/2015 FINDINGS: Cardiac silhouette is normal in size. No mediastinal or hilar masses or evidence of adenopathy. Right internal jugular dual-lumen central venous catheter tip projects in the right atrium, without change. Mild scarring in the left upper lobe lingula. Otherwise clear lungs. No pleural effusion or pneumothorax. Stable changes from left thoracic surgery. Bony thorax is demineralized. IMPRESSION: No acute cardiopulmonary disease. Electronically Signed   By: Lajean Manes M.D.   On: 07/09/2015 16:53   Ct Angio Chest Pe W/cm &/or Wo Cm  Result Date: 07/09/2015 CLINICAL DATA:  Short of breath for 1 month.  History of DVT. EXAM: CT ANGIOGRAPHY CHEST WITH  CONTRAST TECHNIQUE: Multidetector CT imaging of the chest was performed using the standard protocol during bolus administration of intravenous contrast. Multiplanar CT image reconstructions and MIPs were obtained to evaluate the vascular anatomy. CONTRAST:  80 mL of Isovue 370 intravenous contrast COMPARISON:  Current chest radiograph.  Chest CTA, 04/22/2015 FINDINGS: Angiographic study: No evidence of a pulmonary embolus. Great vessels are normal in caliber. No aortic dissection. No significant aortic plaque. Branch vessels are widely patent. Neck base and axilla:  No mass or adenopathy. Mediastinum and hila: Heart normal size and configuration. Minimal coronary artery calcifications. No mediastinal or hilar masses or adenopathy. Lungs and pleura: Patchy areas of hazy ground-glass lung opacity are noted. This is seen bilaterally. This may be due to small airways disease and vascular shunting. Infection is possible. No evidence of pulmonary edema. No lung mass or suspicious nodule. No pleural effusion or pneumothorax. Small calcified granuloma in the right lower lobe. Limited upper abdomen: Nonobstructing stone in the upper pole the left kidney. Low-density mass consistent with cysts from the posterior margin of the right lobe of the  liver. No acute findings. Musculoskeletal: Multiple old rib fractures. Wires encircle portions of the left fifth and sixth ribs. No osteoblastic or osteolytic lesions. Review of the MIP images confirms the above findings. IMPRESSION: 1. No evidence of a pulmonary embolus. 2. Patchy areas of ground-glass opacity in both lungs. Findings suggest small airways disease. Multifocal pneumonia is possible but felt less likely. No pulmonary edema. Electronically Signed   By: Lajean Manes M.D.   On: 07/09/2015 18:51    Assessment & Plan:   Nasier was seen today for hospitalization follow-up.  Diagnoses and all orders for this visit:  Chronic respiratory failure with hypoxia (HCC) -      CBC with Differential/Platelet -     CMP14+EGFR  History of organ or tissue transplant -     CBC with Differential/Platelet -     CMP14+EGFR  Pre-diabetes -     CBC with Differential/Platelet -     CMP14+EGFR  SOB (shortness of breath) -     CBC with Differential/Platelet -     CMP14+EGFR  Anemia, unspecified anemia type -     CBC with Differential/Platelet -     CMP14+EGFR -     Anemia Profile B  Acute renal failure, unspecified acute renal failure type (HCC) -     CBC with Differential/Platelet -     CMP14+EGFR  ALL (acute lymphoid leukemia) in remission ([place])      I am having Mr. Rutt maintain his Multiple Vitamins, Polyethyl Glycol-Propyl Glycol, folic acid, vitamin M-35, albuterol, Magnesium Chloride-Calcium, OXYGEN, eucerin, polyethylene glycol powder, Sirolimus, montelukast, fluconazole, Probiotic Product (TRUBIOTICS PO), lactulose, levalbuterol, Ankle Lace-Up Brace, potassium chloride, linaclotide, rOPINIRole, torsemide, NEFAZODONE HCL PO, aMILoride, cefpodoxime, escitalopram, ONE TOUCH ULTRA TEST, metFORMIN, SYMBICORT, and SPIRIVA HANDIHALER.  No orders of the defined types were placed in this encounter.    Follow-up: Return in about 6 weeks (around 12/02/2015), or if symptoms worsen or fail to improve, for diabetes, anemia.  Claretta Fraise, M.D.

## 2015-10-22 LAB — CMP14+EGFR
A/G RATIO: 2.1 (ref 1.2–2.2)
ALT: 30 IU/L (ref 0–44)
AST: 28 IU/L (ref 0–40)
Albumin: 4.2 g/dL (ref 3.5–5.5)
Alkaline Phosphatase: 83 IU/L (ref 39–117)
BUN/Creatinine Ratio: 19 (ref 9–20)
BUN: 17 mg/dL (ref 6–24)
Bilirubin Total: <0.2 mg/dL (ref 0.0–1.2)
CALCIUM: 9.7 mg/dL (ref 8.7–10.2)
CO2: 30 mmol/L — ABNORMAL HIGH (ref 18–29)
Chloride: 92 mmol/L — ABNORMAL LOW (ref 96–106)
Creatinine, Ser: 0.89 mg/dL (ref 0.76–1.27)
GFR calc Af Amer: 110 mL/min/{1.73_m2} (ref >59)
GFR, EST NON AFRICAN AMERICAN: 95 mL/min/{1.73_m2} (ref >59)
GLOBULIN, TOTAL: 2 g/dL (ref 1.5–4.5)
Glucose: 155 mg/dL — ABNORMAL HIGH (ref 65–99)
POTASSIUM: 3.5 mmol/L (ref 3.5–5.2)
SODIUM: 142 mmol/L (ref 134–144)
Total Protein: 6.2 g/dL (ref 6.0–8.5)

## 2015-10-22 LAB — ANEMIA PROFILE B
BASOS ABS: 0 10*3/uL (ref 0.0–0.2)
Basos: 0 %
EOS (ABSOLUTE): 0.1 10*3/uL (ref 0.0–0.4)
EOS: 2 %
FERRITIN: 850 ng/mL — AB (ref 30–400)
Folate: >20 ng/mL (ref >3.0)
HEMOGLOBIN: 9.5 g/dL — AB (ref 12.6–17.7)
Hematocrit: 29.4 % — ABNORMAL LOW (ref 37.5–51.0)
IMMATURE GRANS (ABS): 0 10*3/uL (ref 0.0–0.1)
IMMATURE GRANULOCYTES: 0 %
Iron Saturation: 26 % (ref 15–55)
Iron: 63 ug/dL (ref 38–169)
LYMPHS: 27 %
Lymphocytes Absolute: 1.9 10*3/uL (ref 0.7–3.1)
MCH: 32.3 pg (ref 26.6–33.0)
MCHC: 32.3 g/dL (ref 31.5–35.7)
MCV: 100 fL — ABNORMAL HIGH (ref 79–97)
MONOS ABS: 0.6 10*3/uL (ref 0.1–0.9)
Monocytes: 8 %
NEUTROS PCT: 63 %
Neutrophils Absolute: 4.4 10*3/uL (ref 1.4–7.0)
Platelets: 113 10*3/uL — ABNORMAL LOW (ref 150–379)
RBC: 2.94 x10E6/uL — AB (ref 4.14–5.80)
RDW: 16.8 % — AB (ref 12.3–15.4)
Retic Ct Pct: 1.8 % (ref 0.6–2.6)
Total Iron Binding Capacity: 242 ug/dL — ABNORMAL LOW (ref 250–450)
UIBC: 179 ug/dL (ref 111–343)
Vitamin B-12: 700 pg/mL (ref 211–946)
WBC: 7.1 10*3/uL (ref 3.4–10.8)

## 2015-11-06 ENCOUNTER — Other Ambulatory Visit: Payer: Self-pay | Admitting: Family Medicine

## 2015-11-13 ENCOUNTER — Other Ambulatory Visit: Payer: Self-pay | Admitting: Family Medicine

## 2015-11-13 ENCOUNTER — Other Ambulatory Visit: Payer: Medicare HMO

## 2015-11-13 DIAGNOSIS — D649 Anemia, unspecified: Secondary | ICD-10-CM

## 2015-11-13 LAB — CBC WITH DIFFERENTIAL/PLATELET
BASOS: 0 %
Basophils Absolute: 0 10*3/uL (ref 0.0–0.2)
EOS (ABSOLUTE): 0.1 10*3/uL (ref 0.0–0.4)
EOS: 1 %
HEMATOCRIT: 29.9 % — AB (ref 37.5–51.0)
HEMOGLOBIN: 9.9 g/dL — AB (ref 12.6–17.7)
Immature Grans (Abs): 0 10*3/uL (ref 0.0–0.1)
Immature Granulocytes: 0 %
LYMPHS ABS: 1.4 10*3/uL (ref 0.7–3.1)
Lymphs: 17 %
MCH: 33 pg (ref 26.6–33.0)
MCHC: 33.1 g/dL (ref 31.5–35.7)
MCV: 100 fL — AB (ref 79–97)
MONOCYTES: 4 %
MONOS ABS: 0.4 10*3/uL (ref 0.1–0.9)
NEUTROS ABS: 6.3 10*3/uL (ref 1.4–7.0)
Neutrophils: 78 %
Platelets: 109 10*3/uL — ABNORMAL LOW (ref 150–379)
RBC: 3 x10E6/uL — ABNORMAL LOW (ref 4.14–5.80)
RDW: 17.5 % — AB (ref 12.3–15.4)
WBC: 8.1 10*3/uL (ref 3.4–10.8)

## 2015-11-18 ENCOUNTER — Other Ambulatory Visit: Payer: Self-pay | Admitting: Family Medicine

## 2015-11-23 ENCOUNTER — Emergency Department (HOSPITAL_COMMUNITY): Payer: Commercial Managed Care - HMO

## 2015-11-23 ENCOUNTER — Encounter (HOSPITAL_COMMUNITY): Payer: Self-pay | Admitting: Emergency Medicine

## 2015-11-23 ENCOUNTER — Inpatient Hospital Stay (HOSPITAL_COMMUNITY)
Admission: EM | Admit: 2015-11-23 | Discharge: 2015-11-27 | DRG: 871 | Disposition: A | Discharge status: Home Health | Payer: Commercial Managed Care - HMO | Attending: Internal Medicine | Admitting: Internal Medicine

## 2015-11-23 DIAGNOSIS — I2782 Chronic pulmonary embolism: Secondary | ICD-10-CM | POA: Diagnosis present

## 2015-11-23 DIAGNOSIS — Z86718 Personal history of other venous thrombosis and embolism: Secondary | ICD-10-CM

## 2015-11-23 DIAGNOSIS — A419 Sepsis, unspecified organism: Secondary | ICD-10-CM | POA: Diagnosis not present

## 2015-11-23 DIAGNOSIS — Z832 Family history of diseases of the blood and blood-forming organs and certain disorders involving the immune mechanism: Secondary | ICD-10-CM

## 2015-11-23 DIAGNOSIS — I2699 Other pulmonary embolism without acute cor pulmonale: Secondary | ICD-10-CM | POA: Diagnosis present

## 2015-11-23 DIAGNOSIS — Z9481 Bone marrow transplant status: Secondary | ICD-10-CM

## 2015-11-23 DIAGNOSIS — K219 Gastro-esophageal reflux disease without esophagitis: Secondary | ICD-10-CM | POA: Diagnosis present

## 2015-11-23 DIAGNOSIS — D696 Thrombocytopenia, unspecified: Secondary | ICD-10-CM | POA: Diagnosis present

## 2015-11-23 DIAGNOSIS — R0902 Hypoxemia: Secondary | ICD-10-CM

## 2015-11-23 DIAGNOSIS — C9101 Acute lymphoblastic leukemia, in remission: Secondary | ICD-10-CM | POA: Diagnosis not present

## 2015-11-23 DIAGNOSIS — F329 Major depressive disorder, single episode, unspecified: Secondary | ICD-10-CM | POA: Diagnosis present

## 2015-11-23 DIAGNOSIS — I5032 Chronic diastolic (congestive) heart failure: Secondary | ICD-10-CM | POA: Diagnosis present

## 2015-11-23 DIAGNOSIS — Z8249 Family history of ischemic heart disease and other diseases of the circulatory system: Secondary | ICD-10-CM

## 2015-11-23 DIAGNOSIS — J9621 Acute and chronic respiratory failure with hypoxia: Secondary | ICD-10-CM | POA: Diagnosis not present

## 2015-11-23 DIAGNOSIS — K5900 Constipation, unspecified: Secondary | ICD-10-CM | POA: Diagnosis present

## 2015-11-23 DIAGNOSIS — D638 Anemia in other chronic diseases classified elsewhere: Secondary | ICD-10-CM | POA: Diagnosis present

## 2015-11-23 DIAGNOSIS — Z833 Family history of diabetes mellitus: Secondary | ICD-10-CM

## 2015-11-23 DIAGNOSIS — M79669 Pain in unspecified lower leg: Secondary | ICD-10-CM | POA: Diagnosis not present

## 2015-11-23 DIAGNOSIS — E876 Hypokalemia: Secondary | ICD-10-CM | POA: Diagnosis present

## 2015-11-23 DIAGNOSIS — I5042 Chronic combined systolic (congestive) and diastolic (congestive) heart failure: Secondary | ICD-10-CM | POA: Diagnosis not present

## 2015-11-23 DIAGNOSIS — F32A Depression, unspecified: Secondary | ICD-10-CM | POA: Diagnosis present

## 2015-11-23 DIAGNOSIS — Z9981 Dependence on supplemental oxygen: Secondary | ICD-10-CM

## 2015-11-23 DIAGNOSIS — Z7984 Long term (current) use of oral hypoglycemic drugs: Secondary | ICD-10-CM

## 2015-11-23 DIAGNOSIS — Z86711 Personal history of pulmonary embolism: Secondary | ICD-10-CM

## 2015-11-23 DIAGNOSIS — E119 Type 2 diabetes mellitus without complications: Secondary | ICD-10-CM

## 2015-11-23 DIAGNOSIS — R0602 Shortness of breath: Secondary | ICD-10-CM

## 2015-11-23 DIAGNOSIS — R509 Fever, unspecified: Secondary | ICD-10-CM

## 2015-11-23 DIAGNOSIS — Z79899 Other long term (current) drug therapy: Secondary | ICD-10-CM

## 2015-11-23 DIAGNOSIS — J40 Bronchitis, not specified as acute or chronic: Secondary | ICD-10-CM

## 2015-11-23 DIAGNOSIS — Z7951 Long term (current) use of inhaled steroids: Secondary | ICD-10-CM

## 2015-11-23 DIAGNOSIS — Z886 Allergy status to analgesic agent status: Secondary | ICD-10-CM

## 2015-11-23 DIAGNOSIS — F419 Anxiety disorder, unspecified: Secondary | ICD-10-CM | POA: Diagnosis present

## 2015-11-23 DIAGNOSIS — Z8042 Family history of malignant neoplasm of prostate: Secondary | ICD-10-CM

## 2015-11-23 DIAGNOSIS — E872 Acidosis: Secondary | ICD-10-CM | POA: Diagnosis present

## 2015-11-23 DIAGNOSIS — J440 Chronic obstructive pulmonary disease with acute lower respiratory infection: Secondary | ICD-10-CM | POA: Diagnosis present

## 2015-11-23 DIAGNOSIS — R0600 Dyspnea, unspecified: Secondary | ICD-10-CM

## 2015-11-23 DIAGNOSIS — J441 Chronic obstructive pulmonary disease with (acute) exacerbation: Secondary | ICD-10-CM

## 2015-11-23 DIAGNOSIS — R040 Epistaxis: Secondary | ICD-10-CM | POA: Diagnosis not present

## 2015-11-23 DIAGNOSIS — J189 Pneumonia, unspecified organism: Secondary | ICD-10-CM

## 2015-11-23 DIAGNOSIS — Z87891 Personal history of nicotine dependence: Secondary | ICD-10-CM

## 2015-11-23 DIAGNOSIS — D72829 Elevated white blood cell count, unspecified: Secondary | ICD-10-CM | POA: Diagnosis present

## 2015-11-23 HISTORY — DX: Bone marrow transplant status: Z94.81

## 2015-11-23 LAB — BASIC METABOLIC PANEL
ANION GAP: 10 (ref 5–15)
BUN: 15 mg/dL (ref 6–20)
CALCIUM: 9.5 mg/dL (ref 8.9–10.3)
CO2: 35 mmol/L — ABNORMAL HIGH (ref 22–32)
CREATININE: 1.17 mg/dL (ref 0.61–1.24)
Chloride: 93 mmol/L — ABNORMAL LOW (ref 101–111)
GLUCOSE: 132 mg/dL — AB (ref 65–99)
Potassium: 3.8 mmol/L (ref 3.5–5.1)
Sodium: 138 mmol/L (ref 135–145)

## 2015-11-23 LAB — URINALYSIS, ROUTINE W REFLEX MICROSCOPIC
BILIRUBIN URINE: NEGATIVE
GLUCOSE, UA: NEGATIVE mg/dL
HGB URINE DIPSTICK: NEGATIVE
Ketones, ur: NEGATIVE mg/dL
Leukocytes, UA: NEGATIVE
Nitrite: NEGATIVE
Protein, ur: NEGATIVE mg/dL
SPECIFIC GRAVITY, URINE: 1.012 (ref 1.005–1.030)
pH: 6 (ref 5.0–8.0)

## 2015-11-23 LAB — CBC
HCT: 31.4 % — ABNORMAL LOW (ref 39.0–52.0)
HEMOGLOBIN: 10.1 g/dL — AB (ref 13.0–17.0)
MCH: 33.7 pg (ref 26.0–34.0)
MCHC: 32.2 g/dL (ref 30.0–36.0)
MCV: 104.7 fL — ABNORMAL HIGH (ref 78.0–100.0)
PLATELETS: 109 10*3/uL — AB (ref 150–400)
RBC: 3 MIL/uL — AB (ref 4.22–5.81)
RDW: 17.7 % — ABNORMAL HIGH (ref 11.5–15.5)
WBC: 11.9 10*3/uL — ABNORMAL HIGH (ref 4.0–10.5)

## 2015-11-23 LAB — I-STAT TROPONIN, ED: TROPONIN I, POC: 0 ng/mL (ref 0.00–0.08)

## 2015-11-23 LAB — I-STAT CG4 LACTIC ACID, ED
LACTIC ACID, VENOUS: 1.34 mmol/L (ref 0.5–1.9)
Lactic Acid, Venous: 2.17 mmol/L (ref 0.5–1.9)

## 2015-11-23 MED ORDER — INSULIN ASPART 100 UNIT/ML ~~LOC~~ SOLN
0.0000 [IU] | Freq: Three times a day (TID) | SUBCUTANEOUS | Status: DC
  Administered 2015-11-24: 9 [IU] via SUBCUTANEOUS
  Administered 2015-11-24: 7 [IU] via SUBCUTANEOUS
  Administered 2015-11-24: 3 [IU] via SUBCUTANEOUS

## 2015-11-23 MED ORDER — DEXTROSE 5 % IV SOLN
1.0000 g | Freq: Once | INTRAVENOUS | Status: AC
  Administered 2015-11-23: 1 g via INTRAVENOUS
  Filled 2015-11-23: qty 10

## 2015-11-23 MED ORDER — ALBUTEROL SULFATE (2.5 MG/3ML) 0.083% IN NEBU
5.0000 mg | INHALATION_SOLUTION | Freq: Once | RESPIRATORY_TRACT | Status: AC
  Administered 2015-11-23: 5 mg via RESPIRATORY_TRACT
  Filled 2015-11-23: qty 6

## 2015-11-23 MED ORDER — ROPINIROLE HCL 1 MG PO TABS
1.0000 mg | ORAL_TABLET | Freq: Every day | ORAL | Status: DC
  Administered 2015-11-24 – 2015-11-26 (×4): 1 mg via ORAL
  Filled 2015-11-23 (×4): qty 1

## 2015-11-23 MED ORDER — INSULIN ASPART 100 UNIT/ML ~~LOC~~ SOLN
0.0000 [IU] | Freq: Every day | SUBCUTANEOUS | Status: DC
  Administered 2015-11-24: 4 [IU] via SUBCUTANEOUS

## 2015-11-23 MED ORDER — PANTOPRAZOLE SODIUM 40 MG PO TBEC
40.0000 mg | DELAYED_RELEASE_TABLET | Freq: Two times a day (BID) | ORAL | Status: DC
  Administered 2015-11-24 – 2015-11-27 (×8): 40 mg via ORAL
  Filled 2015-11-23 (×9): qty 1

## 2015-11-23 MED ORDER — DEXTROSE 5 % IV SOLN
500.0000 mg | INTRAVENOUS | Status: DC
  Administered 2015-11-24 – 2015-11-25 (×2): 500 mg via INTRAVENOUS
  Filled 2015-11-23 (×2): qty 500

## 2015-11-23 MED ORDER — ADULT MULTIVITAMIN W/MINERALS CH
1.0000 | ORAL_TABLET | Freq: Every day | ORAL | Status: DC
Start: 2015-11-24 — End: 2015-11-27
  Administered 2015-11-24 – 2015-11-27 (×4): 1 via ORAL
  Filled 2015-11-23 (×4): qty 1

## 2015-11-23 MED ORDER — SODIUM CHLORIDE 0.9 % IV BOLUS (SEPSIS)
1000.0000 mL | Freq: Once | INTRAVENOUS | Status: AC
  Administered 2015-11-23: 1000 mL via INTRAVENOUS

## 2015-11-23 MED ORDER — MAGNESIUM OXIDE 400 (241.3 MG) MG PO TABS
400.0000 mg | ORAL_TABLET | Freq: Every day | ORAL | Status: DC
  Administered 2015-11-24 – 2015-11-27 (×4): 400 mg via ORAL
  Filled 2015-11-23 (×4): qty 1

## 2015-11-23 MED ORDER — CEFPODOXIME PROXETIL 200 MG PO TABS: 200.0000 mg | ORAL_TABLET | Freq: Every day | ORAL | Status: DC

## 2015-11-23 MED ORDER — DEXTROSE 5 % IV SOLN
500.0000 mg | Freq: Once | INTRAVENOUS | Status: AC
  Administered 2015-11-23: 500 mg via INTRAVENOUS
  Filled 2015-11-23: qty 500

## 2015-11-23 MED ORDER — MONTELUKAST SODIUM 10 MG PO TABS
10.0000 mg | ORAL_TABLET | Freq: Every day | ORAL | Status: DC
  Administered 2015-11-24 – 2015-11-27 (×4): 10 mg via ORAL
  Filled 2015-11-23 (×4): qty 1

## 2015-11-23 MED ORDER — ACETAMINOPHEN 325 MG PO TABS
650.0000 mg | ORAL_TABLET | Freq: Once | ORAL | Status: AC
  Administered 2015-11-23: 650 mg via ORAL
  Filled 2015-11-23: qty 2

## 2015-11-23 MED ORDER — RISAQUAD PO CAPS
1.0000 | ORAL_CAPSULE | Freq: Every day | ORAL | Status: DC
  Administered 2015-11-24 – 2015-11-27 (×4): 1 via ORAL
  Filled 2015-11-23 (×4): qty 1

## 2015-11-23 MED ORDER — LACTULOSE 10 GM/15ML PO SOLN
20.0000 g | Freq: Two times a day (BID) | ORAL | Status: DC
  Administered 2015-11-24 – 2015-11-26 (×4): 20 g via ORAL
  Filled 2015-11-23 (×6): qty 30

## 2015-11-23 MED ORDER — IPRATROPIUM BROMIDE 0.02 % IN SOLN
0.5000 mg | Freq: Once | RESPIRATORY_TRACT | Status: AC
Start: 2015-11-23 — End: 2015-11-23
  Administered 2015-11-23: 0.5 mg via RESPIRATORY_TRACT
  Filled 2015-11-23: qty 2.5

## 2015-11-23 MED ORDER — SIROLIMUS 0.5 MG PO TABS
0.5000 mg | ORAL_TABLET | Freq: Every day | ORAL | Status: DC
  Administered 2015-11-24 – 2015-11-27 (×4): 0.5 mg via ORAL
  Filled 2015-11-23 (×5): qty 1

## 2015-11-23 MED ORDER — IPRATROPIUM BROMIDE 0.02 % IN SOLN
0.5000 mg | RESPIRATORY_TRACT | Status: DC
  Administered 2015-11-24: 0.5 mg via RESPIRATORY_TRACT
  Filled 2015-11-23: qty 2.5

## 2015-11-23 MED ORDER — METHYLPREDNISOLONE SODIUM SUCC 125 MG IJ SOLR
60.0000 mg | Freq: Two times a day (BID) | INTRAMUSCULAR | Status: DC
  Administered 2015-11-24 (×2): 60 mg via INTRAVENOUS
  Filled 2015-11-23 (×2): qty 2

## 2015-11-23 MED ORDER — VITAMIN B-12 1000 MCG PO TABS
1000.0000 ug | ORAL_TABLET | Freq: Every day | ORAL | Status: DC
  Administered 2015-11-24 – 2015-11-27 (×4): 1000 ug via ORAL
  Filled 2015-11-23 (×4): qty 1

## 2015-11-23 MED ORDER — ACETAMINOPHEN 325 MG PO TABS: 650.0000 mg | ORAL_TABLET | Freq: Once | ORAL | Status: DC

## 2015-11-23 MED ORDER — ESCITALOPRAM OXALATE 10 MG PO TABS
10.0000 mg | ORAL_TABLET | Freq: Every day | ORAL | Status: DC
  Administered 2015-11-24 – 2015-11-27 (×4): 10 mg via ORAL
  Filled 2015-11-23 (×4): qty 1

## 2015-11-23 MED ORDER — FLUTICASONE PROPIONATE 50 MCG/ACT NA SUSP
1.0000 | Freq: Every day | NASAL | Status: DC
  Administered 2015-11-24 – 2015-11-27 (×4): 1 via NASAL
  Filled 2015-11-23: qty 16

## 2015-11-23 MED ORDER — HYDROMORPHONE HCL 2 MG PO TABS
4.0000 mg | ORAL_TABLET | Freq: Four times a day (QID) | ORAL | Status: DC | PRN
Start: 2015-11-23 — End: 2015-11-27
  Administered 2015-11-24 – 2015-11-27 (×5): 4 mg via ORAL
  Filled 2015-11-23 (×6): qty 2

## 2015-11-23 MED ORDER — POLYETHYLENE GLYCOL 3350 17 G PO PACK: 17.0000 g | PACK | Freq: Two times a day (BID) | ORAL | Status: DC | PRN

## 2015-11-23 MED ORDER — DM-GUAIFENESIN ER 30-600 MG PO TB12
1.0000 | ORAL_TABLET | Freq: Two times a day (BID) | ORAL | Status: DC
  Administered 2015-11-24 – 2015-11-27 (×8): 1 via ORAL
  Filled 2015-11-23 (×8): qty 1

## 2015-11-23 MED ORDER — ALBUTEROL SULFATE (2.5 MG/3ML) 0.083% IN NEBU
5.0000 mg | INHALATION_SOLUTION | RESPIRATORY_TRACT | Status: DC | PRN
  Administered 2015-11-24: 5 mg via RESPIRATORY_TRACT
  Filled 2015-11-23: qty 6

## 2015-11-23 MED ORDER — NEFAZODONE HCL 50 MG PO TABS
50.0000 mg | ORAL_TABLET | Freq: Every day | ORAL | Status: DC
  Administered 2015-11-24 – 2015-11-26 (×4): 50 mg via ORAL
  Filled 2015-11-23 (×4): qty 1

## 2015-11-23 MED ORDER — ENOXAPARIN SODIUM 40 MG/0.4ML ~~LOC~~ SOLN
40.0000 mg | SUBCUTANEOUS | Status: DC
  Administered 2015-11-24: 40 mg via SUBCUTANEOUS
  Filled 2015-11-23: qty 0.4

## 2015-11-23 MED ORDER — FOLIC ACID 1 MG PO TABS
1.0000 mg | ORAL_TABLET | Freq: Every day | ORAL | Status: DC
  Administered 2015-11-24 – 2015-11-27 (×4): 1 mg via ORAL
  Filled 2015-11-23 (×4): qty 1

## 2015-11-23 MED ORDER — CEFTRIAXONE SODIUM 1 G IJ SOLR
1.0000 g | INTRAMUSCULAR | Status: DC
  Administered 2015-11-24 – 2015-11-26 (×3): 1 g via INTRAVENOUS
  Filled 2015-11-23 (×4): qty 10

## 2015-11-23 MED ORDER — POLYVINYL ALCOHOL 1.4 % OP SOLN
1.0000 [drp] | Freq: Every day | OPHTHALMIC | Status: DC | PRN
  Administered 2015-11-24: 1 [drp] via OPHTHALMIC
  Filled 2015-11-23 (×2): qty 15

## 2015-11-23 NOTE — ED Provider Notes (Signed)
[facility] DEPT Provider Note   CSN: [id number] Arrival date & time: 11/23/15  1622     History   Chief Complaint Chief Complaint  Patient presents with  . Shortness of Breath  . Chest Pain    HPI Derek Shepherd is a 60 y.o. male.  He presents for evaluation of shortness of breath, despite using usual medications. He has a cough which is nonproductive. He has had decreased appetite recently. He denies chest pain, paresthesias, focal weakness, nausea, vomiting or diarrhea. He has not seen his doctor recently. He typically uses oxygen between 3 and 5 L/m, depending on the type of oxygen device. He is using. Today he was visiting his wife, who is in the hospital, and apparently, his oxygen level was checked and found to be low at 70%. Patient was placed on high-volume oxygen 5 L/m, and transferred to the ED for evaluation. There are no other known modifying factors.  HPI  Past Medical History:  Diagnosis Date  . Adenomatous colon polyp    tubular  . Anemia   . Anxiety   . Arthritis   . Asthma   . Bowel obstruction (HCC)    constipation  . CHF (congestive heart failure) ([place])   . COPD (chronic obstructive pulmonary disease) ([place])   . Depression   . Diverticulosis   . DVT (deep venous thrombosis) ([place])   . Gallstones   . GERD (gastroesophageal reflux disease)   . 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 Personal history of colonic  adenoma 01/22/2008    Patient Active Problem List   Diagnosis Date Noted  . Diabetes mellitus, type 2 ([place]) 09/03/2015  . SOB (shortness of breath) 07/08/2015  . History of organ or tissue transplant 06/03/2015  . Frequent falls 05/12/2015  . Polypharmacy 05/12/2015  . Pre-diabetes 04/10/2015  . Bronchiectasis ([place]) 04/08/2015  . Chronic restrictive lung disease 04/08/2015  . Abnormal involuntary movements 03/10/2015  . Restless  leg 11/19/2014  . CHF (congestive heart failure) ([place]) 07/14/2014  . New onset seizure ([place]) 07/10/2014  . Chronic respiratory failure with hypoxia ([place]) 07/04/2013  . ALL (acute lymphoid leukemia) in remission ([place]) 03/20/2013  . COPD (chronic obstructive pulmonary disease) ([place]) 03/20/2013  . DDD (degenerative disc disease), lumbar 03/20/2013  . Chronic pain syndrome 03/20/2013  . Lumbosacral plexopathy 07/12/2011  . Personal history of colonic  adenoma 01/22/2008    Past Surgical History:  Procedure Laterality Date  . BONE MARROW TRANSPLANT  2011  . CHOLECYSTECTOMY    . COLONOSCOPY W/ BIOPSIES    . EXPLORATORY LAPAROTOMY    . HERNIA REPAIR     x 2  . LUNG SURGERY Left        Home Medications    Prior to Admission medications   Medication Sig Start Date End Date Taking? Authorizing Provider  albuterol (PROVENTIL) (2.5 MG/3ML) 0.083% nebulizer solution Take 3 mLs (2.5 mg total) by nebulization every 6 (six) hours as needed for wheezing or shortness of breath. 05/24/14  Yes Claretta Fraise, MD  aMILoride (MIDAMOR) 5 MG tablet Take 1 tablet by mouth daily. 07/03/15  Yes Historical Provider, MD  cefpodoxime (VANTIN) 200 MG tablet Take 200 mg by mouth daily.  06/20/15  Yes Historical Provider, MD  escitalopram (LEXAPRO) 10 MG tablet TAKE ONE TABLET BY MOUTH ONCE DAILY FOR DEPRESSION 11/06/15  Yes Claretta Fraise, MD  fluticasone [facility]) 50 MCG/ACT nasal spray Place 1 spray  into both nostrils daily. 12/23/14  Yes Historical Provider, MD  folic acid (FOLVITE) 1 MG tablet Take 1 mg by mouth daily.  02/11/14  Yes Historical Provider, MD  HYDROmorphone (DILAUDID) 4 MG tablet Take 4 mg by mouth 4 (four) times daily as needed for severe pain.  12/05/15 01/04/16 Yes Historical Provider, MD  lactulose (CHRONULAC) 10 GM/15ML solution Take 30 mLs (20 g total) by mouth 2 (two) times daily. For constipation 04/21/15  Yes Claretta Fraise, MD  levalbuterol Penne Lash) 1.25 MG/0.5ML nebulizer solution Take 1.25  mg by nebulization 4 (four) times daily. 04/21/15  Yes Claretta Fraise, MD  Magnesium Chloride-Calcium 64-106 MG TBEC Take 2 tablets by mouth daily.    Yes Historical Provider, MD  metFORMIN (GLUCOPHAGE) 500 MG tablet Take 1 tablet (500 mg total) by mouth 2 (two) times daily with a meal. 09/03/15  Yes Christy A Hawks, FNP  montelukast (SINGULAIR) 10 MG tablet Take 10 mg by mouth daily. 03/23/15  Yes Historical Provider, MD  Multiple Vitamins tablet Take 1 tablet by mouth daily.    Yes Historical Provider, MD  nefazodone (SERZONE) 50 MG tablet Take 50 mg by mouth at bedtime. 09/15/15  Yes Historical Provider, MD  pantoprazole (PROTONIX) 40 MG tablet Take 40 mg by mouth 2 (two) times daily. 10/13/15 10/12/16 Yes Historical Provider, MD  Polyethyl Glycol-Propyl Glycol (SYSTANE PRESERVATIVE FREE) 0.4-0.3 % SOLN Place 1 drop into both eyes daily as needed. For dry eyes   Yes Historical Provider, MD  polyethylene glycol powder (GLYCOLAX/MIRALAX) powder Take 17 g by mouth 2 (two) times daily as needed. Patient taking differently: Take 17 g by mouth 2 (two) times daily as needed for moderate constipation.  01/27/15  Yes Claretta Fraise, MD  potassium chloride (MICRO-K) 10 MEQ CR capsule TAKE THREE CAPSULES BY MOUTH TWICE DAILY 05/18/15  Yes Claretta Fraise, MD  Probiotic Product (TRUBIOTICS PO) Take 1 capsule by mouth daily.    Yes Historical Provider, MD  rOPINIRole (REQUIP) 1 MG tablet TAKE 1 TABLET BY MOUTH AT BEDTIME 06/09/15  Yes Claretta Fraise, MD  Sirolimus 0.5 MG TABS Take 0.5 mg by mouth daily.  03/31/15  Yes Historical Provider, MD  SPIRIVA HANDIHALER 18 MCG inhalation capsule PLACE THE CONTENTS OF ONE CAPSULE INTO INHALER AND INHALE ONCE DAILY 10/19/15  Yes Claretta Fraise, MD  SYMBICORT 160-4.5 MCG/ACT inhaler INHALE TWO PUFFS BY MOUTH ONCE DAILY IN THE MORNING 10/19/15  Yes Claretta Fraise, MD  torsemide (DEMADEX) 100 MG tablet Take 2 tablets (200 mg total) by mouth daily. Patient taking differently: Take 100 mg by  mouth 2 (two) times daily.  07/08/15  Yes Claretta Fraise, MD  vitamin B-12 (CYANOCOBALAMIN) 1000 MCG tablet Take 1,000 mcg by mouth daily.    Yes Historical Provider, MD  Elastic Bandages & Supports (ANKLE LACE-UP BRACE) MISC Wear daily for two weeks then as needed ASO  Style brace 05/05/15   Claretta Fraise, MD  ONE [facility] ULTRA TEST test strip  08/17/15   Historical Provider, MD  ONE TOUCH ULTRA TEST test strip USE ONE STRIP TO CHECK GLUCOSE ONCE DAILY IN THE MORNING FOR FASTING BLOOD SUGAR 11/18/15   Claretta Fraise, MD  OXYGEN Inhale 3-5 L into the lungs continuous. Continuous 5 LPM when up and about 3 LPM constant and at night    Historical Provider, MD    Family History Family History  Problem Relation Age of Onset  . Clotting disorder Mother   . Heart attack Mother   . Diabetes Father   .  Heart attack Father   . Prostate cancer Brother   . Diabetes Brother     x 3  . Diabetes Sister     x 3  . Diabetes Maternal Aunt     Social History Social History  Substance Use Topics  . Smoking status: Former Smoker    Types: Cigarettes    Quit date: 07/13/1993  . Smokeless tobacco: Never Used  . Alcohol use 0.0 oz/week     Comment: socially     Allergies   Nsaids   Review of Systems Review of Systems  All other systems reviewed and are negative.    Physical Exam Updated Vital Signs BP 119/82 (BP Location: Left Arm)   Pulse 110   Temp 102.5 F (39.2 C) (Oral)   Resp 22   SpO2 98%   Physical Exam  Constitutional: He is oriented to person, place, and time. He appears well-developed. He appears distressed (He is uncomfortable.).  Appears older than stated age.  HENT:  Head: Normocephalic and atraumatic.  Right Ear: External ear normal.  Left Ear: External ear normal.  Eyes: Conjunctivae and EOM are normal. Pupils are equal, round, and reactive to light.  Neck: Normal range of motion and phonation normal. Neck supple.  Cardiovascular: Normal rate, regular rhythm and normal  heart sounds.   Pulmonary/Chest: Effort normal. He exhibits no tenderness and no bony tenderness.  Decreased air movement bilaterally with generalized rhonchi and scattered wheezing. There is no increased work of breathing.  Abdominal: Soft. There is no tenderness.  Musculoskeletal: Normal range of motion. He exhibits edema (2+ lower legs bilaterally.).  Neurological: He is alert and oriented to person, place, and time. No cranial nerve deficit or sensory deficit. He exhibits normal muscle tone. Coordination normal.  Skin: Skin is warm, dry and intact.  Psychiatric: He has a normal mood and affect. His behavior is normal. Judgment and thought content normal.  Nursing note and vitals reviewed.    ED Treatments / Results  Labs (all labs ordered are listed, but only abnormal results are displayed) Labs Reviewed  BASIC METABOLIC PANEL - Abnormal; Notable for the following:       Result Value   Chloride 93 (*)    CO2 35 (*)    Glucose, Bld 132 (*)    All other components within normal limits  CBC - Abnormal; Notable for the following:    WBC 11.9 (*)    RBC 3.00 (*)    Hemoglobin 10.1 (*)    HCT 31.4 (*)    MCV 104.7 (*)    RDW 17.7 (*)    Platelets 109 (*)    All other components within normal limits  CULTURE, BLOOD (ROUTINE X 2)  CULTURE, BLOOD (ROUTINE X 2)  URINE CULTURE  URINALYSIS, ROUTINE W REFLEX MICROSCOPIC (NOT AT [facility])  I-STAT TROPOININ, ED  I-STAT CG4 LACTIC ACID, ED    EKG  EKG Interpretation None       Radiology Dg Chest 2 View  Result Date: 11/23/2015 CLINICAL DATA:  Decreased oxygen saturation with chest pain and shortness of breath EXAM: CHEST  2 VIEW COMPARISON:  Chest radiograph and chest CT Jul 09, 2015 FINDINGS: There is mild generalized interstitial prominence without frank edema or consolidation evident. Heart size and pulmonary vascularity are normal. No adenopathy. Port-A-Cath tip is at the cavoatrial junction. No pneumothorax. No bone lesions  evident. There is postoperative change in the left hemithorax, stable. IMPRESSION: Mild generalized interstitial prominence without frank edema or consolidation  evident by radiography. Stable cardiac silhouette. No pneumothorax. Electronically Signed   By: Lowella Grip III M.D.   On: 11/23/2015 17:24    Procedures Procedures (including critical care time)  Medications Ordered in ED Medications  sodium chloride 0.9 % bolus 1,000 mL (not administered)    And  sodium chloride 0.9 % bolus 1,000 mL (not administered)    And  sodium chloride 0.9 % bolus 1,000 mL (not administered)  acetaminophen (TYLENOL) tablet 650 mg (not administered)  albuterol (PROVENTIL) (2.5 MG/3ML) 0.083% nebulizer solution 5 mg (not administered)  ipratropium (ATROVENT) nebulizer solution 0.5 mg (not administered)     Initial Impression / Assessment and Plan / ED Course  I have reviewed the triage vital signs and the nursing notes.  Pertinent labs & imaging results that were available during my care of the patient were reviewed by me and considered in my medical decision making (see chart for details).  Clinical Course    Medications  sodium chloride 0.9 % bolus 1,000 mL (not administered)    And  sodium chloride 0.9 % bolus 1,000 mL (not administered)    And  sodium chloride 0.9 % bolus 1,000 mL (not administered)  acetaminophen (TYLENOL) tablet 650 mg (not administered)  albuterol (PROVENTIL) (2.5 MG/3ML) 0.083% nebulizer solution 5 mg (not administered)  ipratropium (ATROVENT) nebulizer solution 0.5 mg (not administered)    Patient Vitals for the past 24 hrs:  BP Temp Temp src Pulse Resp SpO2  11/23/15 1748 119/82 102.5 F (39.2 C) Oral 110 22 98 %  11/23/15 1732 144/80 - - 105 15 95 %  11/23/15 1642 135/81 102.9 F (39.4 C) Oral 109 16 (!) 85 %    10:52 PM Reevaluation with update and discussion. After initial assessment and treatment, an updated evaluation reveals patient is somewhat more  comfortable at this time. Findings discussed with the patient and all questions were answered. Shevaun Lovan L    10:52 PM-Consult complete with hospitalist. Patient case explained and discussed. He agrees to admit patient for further evaluation and treatment. Call ended at 23:10   Final Clinical Impressions(s) / ED Diagnoses   Final diagnoses:  Bronchitis  Hypoxia  Febrile illness    Shortness of breath, coughing, and new higher oxygen requirement. No clear evidence for pneumonia. Consideration for non-bacterial infection, such as influenza. Patient has not had influenza immunization, yet this year. Initial elevated lactate improved with IV fluids. Mild elevation of blood cell count.  Nursing Notes Reviewed/ Care Coordinated, and agree without changes. Applicable Imaging Reviewed.  Interpretation of Laboratory Data incorporated into ED treatment  Plan: Admit  New Prescriptions New Prescriptions   No medications on file     Daleen Bo, MD 11/23/15 2317

## 2015-11-23 NOTE — ED Notes (Signed)
Admit nurse in room

## 2015-11-23 NOTE — ED Notes (Signed)
Pt cannot use restroom at this time, aware urine specimen is needed.  

## 2015-11-23 NOTE — ED Triage Notes (Signed)
Pt was upstairs visiting his wife who recently had surgery. While there pt, who is normally on 3L O2 all the time, noted that his O2 level had dropped on 3L. Pt was put in a wheelchair and brought to ER. In ER, pt's O2 on 3L was 78%. Pt on 5L and O2 94%. Pt c/o chest pain and SOB since yesterday. Denies N/V. C/o dizziness, lightheadedness.

## 2015-11-23 NOTE — H&P (Addendum)
History and Physical    SABASTIAN Shepherd [id number] DOB: Mar 24, 1955 DOA: 11/23/2015  Referring MD/NP/PA:   PCP: Claretta Fraise, MD   Patient coming from:  The patient is coming from home.  At baseline, pt is independent for most of ADL.  Chief Complaint: Productive cough, shortness of breath, chest pain, bilateral calf pain  HPI: Derek Shepherd is a 60 y.o. male with medical history significant of chronic respiratory failure, COPD, on 3L oxygen at home, asthma, GERD, depression, anxiety, ALL 2011 (in remission), s/p of chemotherapy, s/p of bone marrow transplantation, Chronic GVHD, DVT, PE, GIB 2013,  Aspergillosis 2012, chronic combined systolic and diastolic CHF, anemia, who presents with productive cough, shortness of breath, chest pain in the bilateral calf pain.  Pt reports that he has been having cough and shortness of breath in the past several days, which have worsened today. He coughs up yellow colored sputum.Today he was visiting his wife, who is in the hospital, and apparently, his oxygen level was checked and found to be low at 70%. Patient was placed on high-volume oxygen 5 L/m, and transferred to the ED for evaluation.  He states that he has chest pain, which is located in the front chest, moderate, pleuritic. It is aggravated by coughing and deep breath. Patient states that he has bilateral calf pain which has been going on for several months.  Patient denies subjective fever and chills, but has temperature 102.9. Patient is constipated, but no nausea, vomiting, abdominal pain, diarrhea. No symptoms of UTI or unilateral weakness.  ED Course: pt was found to have WBC 11.9, lactate 2.17-->1.34, negative urinalysis, creatinine 1.17, temperature while 2.9, tachycardia, tachypnea. Chest x-ray showed mild generalized interstitial prominence without frank edema or consolidation evident by radiography. Pt is placed on telemetry bed for observation.  Review of Systems:   General: has  fevers, no chills, no changes in body weight, has fatigue HEENT: no blurry vision, hearing changes or sore throat Respiratory: no dyspnea, coughing, wheezing CV: has chest pain, no palpitations GI: no nausea, vomiting, abdominal pain, diarrhea, constipation GU: no dysuria, burning on urination, increased urinary frequency, hematuria  Ext: has mild leg edema Neuro: no unilateral weakness, numbness, or tingling, no vision change or hearing loss Skin: no rash, no skin tear. MSK: No muscle spasm, no deformity, no limitation of range of movement in spin Heme: No easy bruising.  Travel history: No recent long distant travel.  Allergy:  Allergies  Allergen Reactions  . Nsaids Shortness Of Breath and Swelling    Past Medical History:  Diagnosis Date  . Adenomatous colon polyp    tubular  . Anemia   . Anxiety   . Arthritis   . Asthma   . Bowel obstruction (HCC)    constipation  . CHF (congestive heart failure) ([place])   . COPD (chronic obstructive pulmonary disease) ([place])   . Depression   . Diverticulosis   . DVT (deep venous thrombosis) ([place])   . Gallstones   . GERD (gastroesophageal reflux disease)   . 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 Leukemia-lymphoma, T-cell, acute, HTLV-I-associated ([place])   . Personal history of colonic  adenoma 01/22/2008    Past Surgical History:  Procedure Laterality Date  . BONE MARROW TRANSPLANT  2011  . CHOLECYSTECTOMY    . COLONOSCOPY W/ BIOPSIES    . EXPLORATORY LAPAROTOMY    . HERNIA REPAIR     x 2  . LUNG SURGERY Left  Social History:  reports that he quit smoking about 22 years ago. His smoking use included Cigarettes. He has never used smokeless tobacco. He reports that he drinks alcohol. He reports that he does not use drugs.  Family History:  Family History  Problem Relation Age of Onset  . Clotting disorder Mother   . Heart attack Mother   . Diabetes Father   . Heart attack  Father   . Prostate cancer Brother   . Diabetes Brother     x 3  . Diabetes Sister     x 3  . Diabetes Maternal Aunt      Prior to Admission medications   Medication Sig Start Date End Date Taking? Authorizing Provider  albuterol (PROVENTIL) (2.5 MG/3ML) 0.083% nebulizer solution Take 3 mLs (2.5 mg total) by nebulization every 6 (six) hours as needed for wheezing or shortness of breath. 05/24/14  Yes Claretta Fraise, MD  aMILoride (MIDAMOR) 5 MG tablet Take 1 tablet by mouth daily. 07/03/15  Yes Historical Provider, MD  cefpodoxime (VANTIN) 200 MG tablet Take 200 mg by mouth daily.  06/20/15  Yes Historical Provider, MD  escitalopram (LEXAPRO) 10 MG tablet TAKE ONE TABLET BY MOUTH ONCE DAILY FOR DEPRESSION 11/06/15  Yes Claretta Fraise, MD  fluticasone [facility] 50 MCG/ACT nasal spray Place 1 spray into both nostrils daily. 12/23/14  Yes Historical Provider, MD  folic acid (FOLVITE) 1 MG tablet Take 1 mg by mouth daily.  02/11/14  Yes Historical Provider, MD  HYDROmorphone (DILAUDID) 4 MG tablet Take 4 mg by mouth 4 (four) times daily as needed for severe pain.  12/05/15 01/04/16 Yes Historical Provider, MD  lactulose (CHRONULAC) 10 GM/15ML solution Take 30 mLs (20 g total) by mouth 2 (two) times daily. For constipation 04/21/15  Yes Claretta Fraise, MD  levalbuterol Penne Lash) 1.25 MG/0.5ML nebulizer solution Take 1.25 mg by nebulization 4 (four) times daily. 04/21/15  Yes Claretta Fraise, MD  Magnesium Chloride-Calcium 64-106 MG TBEC Take 2 tablets by mouth daily.    Yes Historical Provider, MD  metFORMIN (GLUCOPHAGE) 500 MG tablet Take 1 tablet (500 mg total) by mouth 2 (two) times daily with a meal. 09/03/15  Yes Christy A Hawks, FNP  montelukast (SINGULAIR) 10 MG tablet Take 10 mg by mouth daily. 03/23/15  Yes Historical Provider, MD  Multiple Vitamins tablet Take 1 tablet by mouth daily.    Yes Historical Provider, MD  nefazodone (SERZONE) 50 MG tablet Take 50 mg by mouth at bedtime. 09/15/15  Yes  Historical Provider, MD  pantoprazole (PROTONIX) 40 MG tablet Take 40 mg by mouth 2 (two) times daily. 10/13/15 10/12/16 Yes Historical Provider, MD  Polyethyl Glycol-Propyl Glycol (SYSTANE PRESERVATIVE FREE) 0.4-0.3 % SOLN Place 1 drop into both eyes daily as needed. For dry eyes   Yes Historical Provider, MD  polyethylene glycol powder (GLYCOLAX/MIRALAX) powder Take 17 g by mouth 2 (two) times daily as needed. Patient taking differently: Take 17 g by mouth 2 (two) times daily as needed for moderate constipation.  01/27/15  Yes Claretta Fraise, MD  potassium chloride (MICRO-K) 10 MEQ CR capsule TAKE THREE CAPSULES BY MOUTH TWICE DAILY 05/18/15  Yes Claretta Fraise, MD  Probiotic Product (TRUBIOTICS PO) Take 1 capsule by mouth daily.    Yes Historical Provider, MD  rOPINIRole (REQUIP) 1 MG tablet TAKE 1 TABLET BY MOUTH AT BEDTIME 06/09/15  Yes Claretta Fraise, MD  Sirolimus 0.5 MG TABS Take 0.5 mg by mouth daily.  03/31/15  Yes Historical Provider, MD  SPIRIVA HANDIHALER 18 MCG inhalation capsule PLACE THE CONTENTS OF ONE CAPSULE INTO INHALER AND INHALE ONCE DAILY 10/19/15  Yes Claretta Fraise, MD  SYMBICORT 160-4.5 MCG/ACT inhaler INHALE TWO PUFFS BY MOUTH ONCE DAILY IN THE MORNING 10/19/15  Yes Claretta Fraise, MD  torsemide (DEMADEX) 100 MG tablet Take 2 tablets (200 mg total) by mouth daily. Patient taking differently: Take 100 mg by mouth 2 (two) times daily.  07/08/15  Yes Claretta Fraise, MD  vitamin B-12 (CYANOCOBALAMIN) 1000 MCG tablet Take 1,000 mcg by mouth daily.    Yes Historical Provider, MD  Elastic Bandages & Supports (ANKLE LACE-UP BRACE) MISC Wear daily for two weeks then as needed ASO  Style brace 05/05/15   Claretta Fraise, MD  ONE [facility] ULTRA TEST test strip  08/17/15   Historical Provider, MD  ONE TOUCH ULTRA TEST test strip USE ONE STRIP TO CHECK GLUCOSE ONCE DAILY IN THE MORNING FOR FASTING BLOOD SUGAR 11/18/15   Claretta Fraise, MD  OXYGEN Inhale 3-5 L into the lungs continuous. Continuous 5 LPM when up  and about 3 LPM constant and at night    Historical Provider, MD    Physical Exam: Vitals:   11/23/15 2045 11/23/15 2128 11/23/15 2226 11/23/15 2331  BP: 105/85 104/71 109/70 (!) 144/130  Pulse:  88 94 (!) 149  Resp: 13 21 24 18   Temp:      TempSrc:      SpO2:  96% 95% 90%  Weight:      Height:       General: Not in acute distress HEENT:       Eyes: PERRL, EOMI, no scleral icterus.       ENT: No discharge from the ears and nose, no pharynx injection, no tonsillar enlargement.        Neck: No JVD, no bruit, no mass felt. Heme: No neck lymph node enlargement. Cardiac: S1/S2, RRR, No murmurs, No gallops or rubs. Has Port-A line over right upper chest. Respiratory: Has mild wheezing laterally. No rales or rubs. GI: Soft, nondistended, nontender, no rebound pain, no organomegaly, BS present. GU: No hematuria Ext: has trace leg edema bilaterally. 2+DP/PT pulse bilaterally. Musculoskeletal: No joint deformities, No joint redness or warmth, no limitation of ROM in spin. Skin: No rashes.  Neuro: Alert, oriented X3, cranial nerves II-XII grossly intact, moves all extremities normally.  Psych: Patient is not psychotic, no suicidal or hemocidal ideation.  Labs on Admission: I have personally reviewed following labs and imaging studies  CBC:  Recent Labs Lab 11/23/15 1711  WBC 11.9*  HGB 10.1*  HCT 31.4*  MCV 104.7*  PLT 0000000*   Basic Metabolic Panel:  Recent Labs Lab 11/23/15 1711  NA 138  K 3.8  CL 93*  CO2 35*  GLUCOSE 132*  BUN 15  CREATININE 1.17  CALCIUM 9.5   GFR: Estimated Creatinine Clearance: 74 mL/min (by C-G formula based on SCr of 1.17 mg/dL). Liver Function Tests: No results for input(s): AST, ALT, ALKPHOS, BILITOT, PROT, ALBUMIN in the last 168 hours. No results for input(s): LIPASE, AMYLASE in the last 168 hours. No results for input(s): AMMONIA in the last 168 hours. Coagulation Profile: No results for input(s): INR, PROTIME in the last 168  hours. Cardiac Enzymes: No results for input(s): CKTOTAL, CKMB, CKMBINDEX, TROPONINI in the last 168 hours. BNP (last 3 results) No results for input(s): PROBNP in the last 8760 hours. HbA1C: No results for input(s): HGBA1C in the last 72 hours. CBG: No results for  input(s): GLUCAP in the last 168 hours. Lipid Profile: No results for input(s): CHOL, HDL, LDLCALC, TRIG, CHOLHDL, LDLDIRECT in the last 72 hours. Thyroid Function Tests: No results for input(s): TSH, T4TOTAL, FREET4, T3FREE, THYROIDAB in the last 72 hours. Anemia Panel: No results for input(s): VITAMINB12, FOLATE, FERRITIN, TIBC, IRON, RETICCTPCT in the last 72 hours. Urine analysis:    Component Value Date/Time   COLORURINE YELLOW 11/23/2015 2107   APPEARANCEUR CLEAR 11/23/2015 2107   LABSPEC 1.012 11/23/2015 2107   PHURINE 6.0 11/23/2015 2107   GLUCOSEU NEGATIVE 11/23/2015 2107   HGBUR NEGATIVE 11/23/2015 2107   BILIRUBINUR NEGATIVE 11/23/2015 2107   KETONESUR NEGATIVE 11/23/2015 2107   PROTEINUR NEGATIVE 11/23/2015 2107   UROBILINOGEN 1.0 07/18/2009 1936   NITRITE NEGATIVE 11/23/2015 2107   LEUKOCYTESUR NEGATIVE 11/23/2015 2107   Sepsis Labs: @LABRCNTIP (procalcitonin:4,lacticidven:4) )No results found for this or any previous visit (from the past 240 hour(s)).   Radiological Exams on Admission: Dg Chest 2 View  Result Date: 11/23/2015 CLINICAL DATA:  Decreased oxygen saturation with chest pain and shortness of breath EXAM: CHEST  2 VIEW COMPARISON:  Chest radiograph and chest CT Jul 09, 2015 FINDINGS: There is mild generalized interstitial prominence without frank edema or consolidation evident. Heart size and pulmonary vascularity are normal. No adenopathy. Port-A-Cath tip is at the cavoatrial junction. No pneumothorax. No bone lesions evident. There is postoperative change in the left hemithorax, stable. IMPRESSION: Mild generalized interstitial prominence without frank edema or consolidation evident by  radiography. Stable cardiac silhouette. No pneumothorax. Electronically Signed   By: Lowella Grip III M.D.   On: 11/23/2015 17:24     EKG: Independently reviewed.  Not done in ED, will get one.   Assessment/Plan Principal Problem:   Acute on chronic respiratory failure with hypoxia (HCC) Active Problems:   ALL (acute lymphoid leukemia) in remission (HCC)   Chronic combined systolic (congestive) and diastolic (congestive) heart failure (HCC)   SOB (shortness of breath)   COPD exacerbation (HCC)   Diabetes mellitus, type 2 (HCC)   Sepsis (HCC)   Depression   GERD (gastroesophageal reflux disease)   Constipation   Acute on chronic respiratory failure with hypoxia (HCC) and COPD exacerbation and sepsis: Patient has productive cough, shortness of breath and wheezing, most likely caused by COPD exacerbation. Chest x-ray has no obvious infiltration though cannot completely rule out early stage of a pneumonia. A potential differential diagnosis is pulmonary embolism given history of DVT, current bilateral calf pain and pleuritic chest pain-->will need to r/o PE. Pt meets criteria for sepsis with elevated lactate, fever, leukocytosis, tachycardia, tachypnea. Elevated lactic acid level was normalized after 3L of NS bolus in ED (lactic acid 2.17-->1.34). Currently hemodynamically stable.  -will place on telemetry bed for obs -Nebulizers: scheduled atrovent and prn albuterol -Solu-Medrol 60 mg IV bid - continue home Singulair -IV Rocephin and azithromycin were started in ED, will continue both due to immunosuppressed state -Mucinex for cough  -Urine S. pneumococcal antigen -Follow up blood culture x2, sputum culture, respiratory virus panel, Flu pcr -will get Procalcitonin and trend lactic acid levels per sepsis protocol. -IVF: pt received 3L of NS bolus in ED-->will not continue IVF given EF of 30-35% and normalized lactic acid level. -will check LE venous doppler to r/o DVT -will get  D-dimer. If positive-->will get CTA to r/o PE (D-dimer is likely positive due to sepsis)  Addendum: lactic acid level is trending up 2.17-->1.34-->3.8. D-dimer positive 2.11 -will give 500 cc NS and then 100  cc/h -continue to trend lactic acid levels -will get CTA to r/o PE.  ALL: pt has been followed up by oncologist, Dr. Beaulah Dinning in [facility] s/p BM transportation, currently on sirolimus.  -Continue Sirolimus -Follow-up with oncologist in at least hospital  -Chart review showed pt has Diflucan and Bactrim on his med list. He also has Cefpodoxime on his med list. Pt is not clear if he is taking these meds. He told different story to me and pharmacist. This needs to be clarified with his oncologist, Dr. Beaulah Dinning in AM.   Chronic combined systolic (congestive) and diastolic (congestive) heart failure ([place]): 2-D echo on 12/28/14 showed EF 30-35 percent with grade 2 diastolic dysfunction. Patient is on torsemide and amiloride at home. He has trace leg edema, but no JVD. CHF seems to be compensated. -Hold torsemide and amiloride due to sepsis -Check BNP  DM-II: Last A1c 5.6 on 12/27/14, well controled. Patient is taking metformin at home -SSI  Depression: Stable, no suicidal or homicidal ideations. -Continue home medications: Nefazodone, Lexapro  GERD: -Protonix  Constipation: -Lactulose and MiraLAX   DVT ppx: SQ Lovenox Code Status: Full code Family Communication: None at bed side.  Disposition Plan:  Anticipate discharge back to previous home environment Consults called:  none Admission status: Obs / tele   Date of Service 11/24/2015    Ivor Costa [facility] Pager [phone]  If 7PM-7AM, please contact night-coverage [email or web address] Password [email or web address] 11/24/2015, 12:13 AM

## 2015-11-24 ENCOUNTER — Encounter (HOSPITAL_COMMUNITY): Payer: Self-pay

## 2015-11-24 ENCOUNTER — Observation Stay (HOSPITAL_COMMUNITY): Payer: Commercial Managed Care - HMO

## 2015-11-24 DIAGNOSIS — Z86711 Personal history of pulmonary embolism: Secondary | ICD-10-CM | POA: Diagnosis not present

## 2015-11-24 DIAGNOSIS — Z87891 Personal history of nicotine dependence: Secondary | ICD-10-CM | POA: Diagnosis not present

## 2015-11-24 DIAGNOSIS — D72829 Elevated white blood cell count, unspecified: Secondary | ICD-10-CM | POA: Diagnosis present

## 2015-11-24 DIAGNOSIS — D696 Thrombocytopenia, unspecified: Secondary | ICD-10-CM | POA: Diagnosis present

## 2015-11-24 DIAGNOSIS — C9101 Acute lymphoblastic leukemia, in remission: Secondary | ICD-10-CM | POA: Diagnosis present

## 2015-11-24 DIAGNOSIS — J189 Pneumonia, unspecified organism: Secondary | ICD-10-CM

## 2015-11-24 DIAGNOSIS — J9621 Acute and chronic respiratory failure with hypoxia: Secondary | ICD-10-CM | POA: Diagnosis present

## 2015-11-24 DIAGNOSIS — A419 Sepsis, unspecified organism: Secondary | ICD-10-CM | POA: Diagnosis present

## 2015-11-24 DIAGNOSIS — J440 Chronic obstructive pulmonary disease with acute lower respiratory infection: Secondary | ICD-10-CM | POA: Diagnosis present

## 2015-11-24 DIAGNOSIS — E119 Type 2 diabetes mellitus without complications: Secondary | ICD-10-CM | POA: Diagnosis present

## 2015-11-24 DIAGNOSIS — Z9981 Dependence on supplemental oxygen: Secondary | ICD-10-CM | POA: Diagnosis not present

## 2015-11-24 DIAGNOSIS — I5042 Chronic combined systolic (congestive) and diastolic (congestive) heart failure: Secondary | ICD-10-CM | POA: Diagnosis present

## 2015-11-24 DIAGNOSIS — K5900 Constipation, unspecified: Secondary | ICD-10-CM | POA: Diagnosis present

## 2015-11-24 DIAGNOSIS — Z86718 Personal history of other venous thrombosis and embolism: Secondary | ICD-10-CM | POA: Diagnosis not present

## 2015-11-24 DIAGNOSIS — Z9481 Bone marrow transplant status: Secondary | ICD-10-CM

## 2015-11-24 DIAGNOSIS — F32A Depression, unspecified: Secondary | ICD-10-CM | POA: Diagnosis present

## 2015-11-24 DIAGNOSIS — M79669 Pain in unspecified lower leg: Secondary | ICD-10-CM | POA: Diagnosis present

## 2015-11-24 DIAGNOSIS — K219 Gastro-esophageal reflux disease without esophagitis: Secondary | ICD-10-CM | POA: Diagnosis present

## 2015-11-24 DIAGNOSIS — I2699 Other pulmonary embolism without acute cor pulmonale: Secondary | ICD-10-CM | POA: Diagnosis present

## 2015-11-24 DIAGNOSIS — D638 Anemia in other chronic diseases classified elsewhere: Secondary | ICD-10-CM | POA: Diagnosis present

## 2015-11-24 DIAGNOSIS — J441 Chronic obstructive pulmonary disease with (acute) exacerbation: Secondary | ICD-10-CM | POA: Diagnosis present

## 2015-11-24 DIAGNOSIS — F329 Major depressive disorder, single episode, unspecified: Secondary | ICD-10-CM | POA: Diagnosis present

## 2015-11-24 DIAGNOSIS — E876 Hypokalemia: Secondary | ICD-10-CM | POA: Diagnosis present

## 2015-11-24 DIAGNOSIS — F419 Anxiety disorder, unspecified: Secondary | ICD-10-CM | POA: Diagnosis present

## 2015-11-24 DIAGNOSIS — R0602 Shortness of breath: Secondary | ICD-10-CM | POA: Diagnosis present

## 2015-11-24 DIAGNOSIS — I5032 Chronic diastolic (congestive) heart failure: Secondary | ICD-10-CM | POA: Diagnosis present

## 2015-11-24 DIAGNOSIS — E872 Acidosis: Secondary | ICD-10-CM | POA: Diagnosis present

## 2015-11-24 DIAGNOSIS — I2782 Chronic pulmonary embolism: Secondary | ICD-10-CM | POA: Diagnosis present

## 2015-11-24 DIAGNOSIS — R040 Epistaxis: Secondary | ICD-10-CM | POA: Diagnosis not present

## 2015-11-24 LAB — PROTIME-INR
INR: 1
PROTHROMBIN TIME: 13.2 s (ref 11.4–15.2)

## 2015-11-24 LAB — GLUCOSE, CAPILLARY
Glucose-Capillary: 154 mg/dL — ABNORMAL HIGH (ref 65–99)
Glucose-Capillary: 229 mg/dL — ABNORMAL HIGH (ref 65–99)
Glucose-Capillary: 355 mg/dL — ABNORMAL HIGH (ref 65–99)
Glucose-Capillary: 316 mg/dL — ABNORMAL HIGH (ref 65–99)
Glucose-Capillary: 327 mg/dL — ABNORMAL HIGH (ref 65–99)

## 2015-11-24 LAB — HIV ANTIBODY (ROUTINE TESTING W REFLEX): HIV SCREEN 4TH GENERATION: NONREACTIVE

## 2015-11-24 LAB — INFLUENZA PANEL BY PCR (TYPE A & B)
H1N1 flu by pcr: NOT DETECTED
Influenza A By PCR: NEGATIVE
Influenza B By PCR: NEGATIVE

## 2015-11-24 LAB — BRAIN NATRIURETIC PEPTIDE: B NATRIURETIC PEPTIDE 5: 73.1 pg/mL (ref 0.0–100.0)

## 2015-11-24 LAB — STREP PNEUMONIAE URINARY ANTIGEN: Strep Pneumo Urinary Antigen: NEGATIVE

## 2015-11-24 LAB — EXPECTORATED SPUTUM ASSESSMENT W GRAM STAIN, RFLX TO RESP C

## 2015-11-24 LAB — APTT: aPTT: 27 seconds (ref 24–36)

## 2015-11-24 LAB — EXPECTORATED SPUTUM ASSESSMENT W REFEX TO RESP CULTURE

## 2015-11-24 LAB — D-DIMER, QUANTITATIVE (NOT AT ARMC): D DIMER QUANT: 2.11 ug{FEU}/mL — AB (ref 0.00–0.50)

## 2015-11-24 LAB — LACTIC ACID, PLASMA
LACTIC ACID, VENOUS: 3.8 mmol/L — AB (ref 0.5–1.9)
Lactic Acid, Venous: 1.9 mmol/L (ref 0.5–1.9)

## 2015-11-24 LAB — PROCALCITONIN: Procalcitonin: 0.68 ng/mL

## 2015-11-24 MED ORDER — TORSEMIDE 100 MG PO TABS
100.0000 mg | ORAL_TABLET | Freq: Two times a day (BID) | ORAL | Status: DC
  Administered 2015-11-24 – 2015-11-27 (×6): 100 mg via ORAL
  Filled 2015-11-24 (×7): qty 1

## 2015-11-24 MED ORDER — SODIUM CHLORIDE 0.9 % IV SOLN: INTRAVENOUS | Status: DC

## 2015-11-24 MED ORDER — IPRATROPIUM BROMIDE 0.02 % IN SOLN
0.5000 mg | Freq: Four times a day (QID) | RESPIRATORY_TRACT | Status: DC
  Administered 2015-11-24: 0.5 mg via RESPIRATORY_TRACT
  Filled 2015-11-24: qty 2.5

## 2015-11-24 MED ORDER — SODIUM CHLORIDE 0.9 % IV BOLUS (SEPSIS)
500.0000 mL | Freq: Once | INTRAVENOUS | Status: AC
  Administered 2015-11-24: 500 mL via INTRAVENOUS

## 2015-11-24 MED ORDER — ACETAMINOPHEN 325 MG PO TABS
650.0000 mg | ORAL_TABLET | ORAL | Status: DC | PRN
  Administered 2015-11-24 – 2015-11-27 (×7): 650 mg via ORAL
  Filled 2015-11-24 (×7): qty 2

## 2015-11-24 MED ORDER — METHYLPREDNISOLONE SODIUM SUCC 40 MG IJ SOLR
40.0000 mg | Freq: Every day | INTRAMUSCULAR | Status: DC
  Administered 2015-11-25: 40 mg via INTRAVENOUS
  Filled 2015-11-24: qty 1

## 2015-11-24 MED ORDER — ENOXAPARIN SODIUM 60 MG/0.6ML ~~LOC~~ SOLN
60.0000 mg | SUBCUTANEOUS | Status: AC
  Administered 2015-11-24: 60 mg via SUBCUTANEOUS
  Filled 2015-11-24: qty 0.6

## 2015-11-24 MED ORDER — IPRATROPIUM-ALBUTEROL 0.5-2.5 (3) MG/3ML IN SOLN: 3.0000 mL | RESPIRATORY_TRACT | Status: DC | PRN

## 2015-11-24 MED ORDER — IOPAMIDOL (ISOVUE-370) INJECTION 76%
100.0000 mL | Freq: Once | INTRAVENOUS | Status: AC | PRN
  Administered 2015-11-24: 100 mL via INTRAVENOUS

## 2015-11-24 MED ORDER — IPRATROPIUM-ALBUTEROL 0.5-2.5 (3) MG/3ML IN SOLN
3.0000 mL | Freq: Three times a day (TID) | RESPIRATORY_TRACT | Status: DC
  Administered 2015-11-24 – 2015-11-27 (×9): 3 mL via RESPIRATORY_TRACT
  Filled 2015-11-24 (×9): qty 3

## 2015-11-24 MED ORDER — ALBUTEROL SULFATE (2.5 MG/3ML) 0.083% IN NEBU
2.5000 mg | INHALATION_SOLUTION | Freq: Four times a day (QID) | RESPIRATORY_TRACT | Status: DC
  Administered 2015-11-24: 2.5 mg via RESPIRATORY_TRACT
  Filled 2015-11-24: qty 3

## 2015-11-24 MED ORDER — ENOXAPARIN SODIUM 100 MG/ML ~~LOC~~ SOLN
100.0000 mg | Freq: Two times a day (BID) | SUBCUTANEOUS | Status: DC
  Administered 2015-11-24 – 2015-11-26 (×4): 100 mg via SUBCUTANEOUS
  Filled 2015-11-24 (×4): qty 1

## 2015-11-24 NOTE — Progress Notes (Addendum)
Patient ID: Derek Shepherd, male   DOB: [birth year], 58 y.o.   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  Patient presented to [facility] with increasing shortness of breath, cough productive of yellowish sputum, worsening over past 24 hours prior to this admission. Patient was visiting his wife in the hospital and had a more labored breathing and when he checked his oxygen he was in the low 70's. Patient was subsequently transferred to ED for evaluation. Patient reported also associated bilateral calf pain going on for several months. No fevers or chills.  In ED, blood pressure was 104/71, HR 82-149, RR 13-24, T max 102.9 F, oxygen saturation 85% with 5 L nasal cannula oxygen support. This has subsequently improved to 95%. Blood work was notable for leukocytosis of 11.9, hemoglobin 10.1, platelets 109 lactic acid initially within normal limits but repeat level 3.8. Procalcitonin was 0.68. Chest x-ray showed interstitial prominence without frank edema or consolidation. He was admitted for management of community-acquired pneumonia.  Notified by radiology department at [place] chest is positive for pulmonary embolism, single subsegmental embolism to the lingula new from May 2017. Patient also has mild infectious or inflammatory airspace disease in the left lower lobe.  He is on azithromycin and Rocephin and we will start Lovenox today and we will monitor for potential bleed.   Assessment & Plan:   Principal Problem:   Acute on chronic respiratory failure with hypoxia (HCC) / COPD exacerbation (HCC) - Likely secondary to combination of pneumonia, COPD as well as pulmonary embolism - Respiratory status is stable at this time - Patient is saturating 95% with 5 L nasal cannula oxygen support - Continue DuoNeb 3 times daily scheduled and every 4 hours as needed for shortness of breath or wheezing - We will reduce steroids from 60 mg IV every 12 hours to 40 mg IV daily  Active Problems:   Sepsis due to pneumonia ([place]) / Leukocytosis - Sepsis criteria met on the admission with fever, tachycardia, tachypnea, hypoxia, leukocytosis and lactic acidosis - Source of infection likely pneumonia as noted on CT scan in left lower lobe - Follow-up blood culture results - Sputum culture pending - Influenza panel negative - Streptococcus antigen negative    Acute pulmonary embolism (HCC) / history of DVT and PE - Single subsegmental embolism to the lingula new from May 2017. Patient apparently had history of GI bleed in 2013 but non-since then however his blood thinner was stopped at that time. He doesn't have IVC filter placed.  - We'll start Lovenox today and monitor for any sign of bleed - Obtain LE doppler     Anemia of chronic disease / Thrombocytopenia ([place])   - In pt with history of bone marrow transplant ([place]), ALL - Continue to monitor daily CBC    Chronic combined systolic and diastolic CHF - Last 2-D echo in 2016 with ejection fraction  30% and grade 2 diastolic dysfunction - Compensated - Resume torsemide    Diabetes mellitus without complications without long-term insulin use - Patient takes metformin at home but in hospital he is on sliding scale insulin    History of bone marrow transplant ([place]) / ALL - Continue tacrolimus    Depression - Not  depressed at this time - Continue Lexapro   DVT prophylaxis: Lovenox subcutaneous for pulmonary embolism Code Status: full code  Family Communication: No family at the bedside Disposition Plan: Home likely by 11/26/2015. He will be started on Lovenox today, we need to monitor for potential bleed as patient used to be on Lovenox about few years ago and apparently this was stopped because of risk of GI bleed. No reports of bleeding on the admission but we will monitor for bleed since we will start Lovenox today.   Consultants:   None   Procedures:   LE doppler - pending  Antimicrobials:   Azithromycin and Rocephin 11/23/2015 -->     Subjective: No overnight events.   Objective: Vitals:   11/24/15 0123 11/24/15 0600 11/24/15 0741 11/24/15 0744  BP: (!) 157/90 132/80    Pulse: (!) 102 82    Resp: 20 18    Temp: 99.9 F (37.7 C) 98.4 F (36.9 C)    TempSrc: Oral Oral    SpO2: 100% 97% 95% 95%  Weight: 96.6 kg (213 lb)     Height: 5' 6"  (1.676 m)       Intake/Output Summary (Last 24 hours) at 11/24/15 1031 Last data filed at 11/24/15 9326  Gross per 24 hour  Intake             4.17 ml  Output             1000 ml  Net          -995.83 ml   Filed Weights   11/23/15 1759 11/24/15 0123  Weight: 92.1 kg (203 lb) 96.6 kg (213 lb)    Examination:  General exam: Appears calm and comfortable  Respiratory system: Diminished breath sounds, no wheezing                 Cardiovascular system: S1 & S2 heard, RRR. No pedal edema. Gastrointestinal system: Abdomen is nondistended, soft and nontender. No organomegaly or masses felt. Normal bowel sounds heard. Central nervous system: Alert and oriented. No focal neurological deficits. Extremities: Symmetric 5 x 5 power. Skin: No rashes, lesions or ulcers Psychiatry: Judgement and insight appear normal. Mood & affect appropriate.   Data Reviewed: I have personally reviewed following labs and imaging studies  CBC:  Recent  Labs Lab 11/23/15 1711  WBC 11.9*  HGB 10.1*  HCT 31.4*  MCV 104.7*  PLT 712*   Basic Metabolic Panel:  Recent Labs Lab 11/23/15 1711  NA 138  K 3.8  CL 93*  CO2 35*  GLUCOSE 132*  BUN 15  CREATININE 1.17  CALCIUM 9.5   GFR: Estimated Creatinine Clearance: 75.8 mL/min (by C-G formula based on SCr of 1.17 mg/dL). Liver Function Tests: No results for input(s): AST, ALT, ALKPHOS, BILITOT, PROT, ALBUMIN in the last 168 hours. No results for input(s): LIPASE, AMYLASE in the last 168 hours. No results for input(s): AMMONIA in the last 168 hours. Coagulation Profile:  Recent Labs Lab 11/24/15 0229  INR 1.00   Cardiac Enzymes: No results for input(s): CKTOTAL, CKMB, CKMBINDEX, TROPONINI in the last 168 hours. BNP (last 3 results) No results for input(s):  PROBNP in the last 8760 hours. HbA1C: No results for input(s): HGBA1C in the last 72 hours. CBG:  Recent Labs Lab 11/24/15 0153 11/24/15 0747  GLUCAP 154* 229*   Lipid Profile: No results for input(s): CHOL, HDL, LDLCALC, TRIG, CHOLHDL, LDLDIRECT in the last 72 hours. Thyroid Function Tests: No results for input(s): TSH, T4TOTAL, FREET4, T3FREE, THYROIDAB in the last 72 hours. Anemia Panel: No results for input(s): VITAMINB12, FOLATE, FERRITIN, TIBC, IRON, RETICCTPCT in the last 72 hours. Urine analysis:    Component Value Date/Time   COLORURINE YELLOW 11/23/2015 2107   APPEARANCEUR CLEAR 11/23/2015 2107   LABSPEC 1.012 11/23/2015 2107   PHURINE 6.0 11/23/2015 2107   GLUCOSEU NEGATIVE 11/23/2015 2107   HGBUR NEGATIVE 11/23/2015 2107   BILIRUBINUR NEGATIVE 11/23/2015 2107   KETONESUR NEGATIVE 11/23/2015 2107   PROTEINUR NEGATIVE 11/23/2015 2107   UROBILINOGEN 1.0 07/18/2009 1936   NITRITE NEGATIVE 11/23/2015 2107   LEUKOCYTESUR NEGATIVE 11/23/2015 2107   Sepsis Labs: @LABRCNTIP (procalcitonin:4,lacticidven:4)    Recent Results (from the past 240 hour(s))  Blood Culture (routine x 2)     Status: None  (Preliminary result)   Collection Time: 11/23/15  5:12 PM  Result Value Ref Range Status   Specimen Description BLOOD LEFT ANTECUBITAL  Final   Special Requests BOTTLES DRAWN AEROBIC AND ANAEROBIC 5CC  Final   Culture   Final    NO GROWTH < 24 HOURS Performed at [facility]    Report Status PENDING  Incomplete  Blood Culture (routine x 2)     Status: None (Preliminary result)   Collection Time: 11/23/15  5:55 PM  Result Value Ref Range Status   Specimen Description BLOOD RIGHT ANTECUBITAL  Final   Special Requests BOTTLES DRAWN AEROBIC AND ANAEROBIC 5ML  Final   Culture   Final    NO GROWTH < 24 HOURS Performed at [facility]    Report Status PENDING  Incomplete      Radiology Studies: Dg Chest 2 View Result Date: 11/23/2015 Mild generalized interstitial prominence without frank edema or consolidation evident by radiography. Stable cardiac silhouette. No pneumothorax. Electronically Signed   By: Lowella Grip III M.D.   On: 11/23/2015 17:24   Ct Angio Chest Pe W Or Wo Contrast Result Date: 11/24/2015  1. Single subsegmental embolism to the lingula that is new from 07/09/2015. 2. Mild infectious or inflammatory airspace disease in the left lower lobe. 3. Right lower lobe atelectasis. 4. Hepatic steatosis and left nephrolithiasis. Electronically Signed   By: Monte Fantasia M.D.   On: 11/24/2015 09:34    Scheduled Meds: . acidophilus  1 capsule Oral Daily  . azithromycin (ZITHROMAX) 500 MG IVPB  500 mg Intravenous Q24H  . cefTRIAXone (ROCEPHIN) IVPB 1 gram/50 mL D5W  1 g Intravenous Q24H  . dextromethorphan-guaiFENesin  1 tablet Oral BID  . enoxaparin (LOVENOX) injection  100 mg Subcutaneous Q12H  . enoxaparin (LOVENOX) injection  60 mg Subcutaneous NOW  . escitalopram  10 mg Oral Daily  . fluticasone  1 spray Each Nare Daily  . folic acid  1 mg Oral Daily  . insulin aspart  0-5 Units Subcutaneous QHS  . insulin aspart  0-9 Units Subcutaneous TID WC  .  ipratropium-albuterol  3 mL Nebulization TID  . lactulose  20 g Oral BID  . magnesium oxide  400 mg Oral Daily  . [START ON 11/25/2015] methylPREDNISolone (SOLU-MEDROL) injection  40 mg Intravenous Daily  . montelukast  10 mg Oral Daily  .  multivitamin with minerals  1 tablet Oral Daily  . nefazodone  50 mg Oral QHS  . pantoprazole  40 mg Oral BID  . rOPINIRole  1 mg Oral QHS  . Sirolimus  0.5 mg Oral Daily  . vitamin B-12  1,000 mcg Oral Daily    Continuous Infusions:    LOS: 0 days    Time spent: 25 minutes  Greater than 50% of the time spent on counseling and coordinating the care.   Leisa Lenz, MD [facility] Pager [phone]  If 7PM-7AM, please contact night-coverage [email or web address] Password [email or web address] 11/24/2015, 10:31 AM

## 2015-11-24 NOTE — Progress Notes (Signed)
Nutrition Brief Note  RD consulted via COPD gold protocol.  Patient consumed >75% of his lunch of a chef's salad and ice cream. Pt states he feels full after eating so much. "The more I eat, the more I hurt". Spoke briefly about portion sizes of foods and focusing mainly on protein foods in meals.  Pt's weight ranges between 205-219 lb x the past 7 months.   Wt Readings from Last 15 Encounters:  11/24/15 213 lb (96.6 kg)  10/21/15 213 lb 3.2 oz (96.7 kg)  10/10/15 214 lb (97.1 kg)  09/03/15 205 lb (93 kg)  07/09/15 210 lb 6 oz (95.4 kg)  07/08/15 211 lb 6.4 oz (95.9 kg)  06/18/15 210 lb (95.3 kg)  06/03/15 208 lb 12.8 oz (94.7 kg)  06/01/15 205 lb (93 kg)  05/12/15 218 lb 6 oz (99.1 kg)  05/05/15 215 lb (97.5 kg)  04/30/15 216 lb 6.4 oz (98.2 kg)  04/22/15 217 lb (98.4 kg)  04/21/15 217 lb (98.4 kg)  04/10/15 219 lb (99.3 kg)    Body mass index is 34.38 kg/m. Patient meets criteria for obesity based on current BMI.   Current diet order is Heart Healthy/CHO modified, patient is consuming approximately 100% of meals at this time. Labs and medications reviewed.   No nutrition interventions warranted at this time. If nutrition issues arise, please consult RD.   Clayton Bibles, MS, RD, LDN Pager: [phone] After Hours Pager: [phone]

## 2015-11-24 NOTE — Progress Notes (Signed)
VASCULAR LAB PRELIMINARY  PRELIMINARY  PRELIMINARY  PRELIMINARY  Bilateral lower extremity venous duplex completed.    Preliminary report:  Bilateral:  No obvious evidence of DVT, superficial thrombosis, or Baker's Cyst.   Ancel Easler, RVS 11/24/2015, 3:55 PM

## 2015-11-24 NOTE — Progress Notes (Signed)
CRITICAL VALUE ALERT  Critical value received:  Lactic Acid 3.8  Date of notification:  11/24/15  Time of notification:  0318  Critical value read back:Yes.    Nurse who received alert:  C. Arden Tinoco, RN  MD notified (1st page): K. Schorr  Time of first page:  [phone]  MD notified (2nd page):  Time of second page:  Responding MD:   Time MD responded: [phone]

## 2015-11-24 NOTE — Progress Notes (Signed)
PT Cancellation Note  Patient Details Name: CHOZEN RAPUANO MRN: [id number] DOB: [birth year]   Cancelled Treatment:    Reason Eval/Treat Not Completed: Patient not medically ready (R/O PE pending. will initiate PT when medically stable.)   Claretha Cooper 11/24/2015, 9:06 AM Tresa Endo PT [phone]

## 2015-11-24 NOTE — Progress Notes (Signed)
Pt arrive on unit from ED.

## 2015-11-25 DIAGNOSIS — J189 Pneumonia, unspecified organism: Secondary | ICD-10-CM

## 2015-11-25 DIAGNOSIS — I5042 Chronic combined systolic (congestive) and diastolic (congestive) heart failure: Secondary | ICD-10-CM

## 2015-11-25 DIAGNOSIS — I2699 Other pulmonary embolism without acute cor pulmonale: Secondary | ICD-10-CM

## 2015-11-25 DIAGNOSIS — E119 Type 2 diabetes mellitus without complications: Secondary | ICD-10-CM

## 2015-11-25 DIAGNOSIS — D72829 Elevated white blood cell count, unspecified: Secondary | ICD-10-CM

## 2015-11-25 DIAGNOSIS — J9621 Acute and chronic respiratory failure with hypoxia: Secondary | ICD-10-CM

## 2015-11-25 DIAGNOSIS — A419 Sepsis, unspecified organism: Principal | ICD-10-CM

## 2015-11-25 LAB — CBC
HCT: 25.6 % — ABNORMAL LOW (ref 39.0–52.0)
Hemoglobin: 8.4 g/dL — ABNORMAL LOW (ref 13.0–17.0)
MCH: 34 pg (ref 26.0–34.0)
MCHC: 32.8 g/dL (ref 30.0–36.0)
MCV: 103.6 fL — ABNORMAL HIGH (ref 78.0–100.0)
PLATELETS: 86 10*3/uL — AB (ref 150–400)
RBC: 2.47 MIL/uL — AB (ref 4.22–5.81)
RDW: 18.1 % — ABNORMAL HIGH (ref 11.5–15.5)
WBC: 13.3 10*3/uL — AB (ref 4.0–10.5)

## 2015-11-25 LAB — BASIC METABOLIC PANEL
Anion gap: 7 (ref 5–15)
BUN: 22 mg/dL — AB (ref 6–20)
CO2: 35 mmol/L — ABNORMAL HIGH (ref 22–32)
Calcium: 9.7 mg/dL (ref 8.9–10.3)
Chloride: 99 mmol/L — ABNORMAL LOW (ref 101–111)
Creatinine, Ser: 1.1 mg/dL (ref 0.61–1.24)
GFR calc Af Amer: >60 mL/min (ref >60)
Glucose, Bld: 273 mg/dL — ABNORMAL HIGH (ref 65–99)
POTASSIUM: 3.3 mmol/L — AB (ref 3.5–5.1)
SODIUM: 141 mmol/L (ref 135–145)

## 2015-11-25 LAB — GLUCOSE, CAPILLARY
Glucose-Capillary: 174 mg/dL — ABNORMAL HIGH (ref 65–99)
Glucose-Capillary: 233 mg/dL — ABNORMAL HIGH (ref 65–99)
Glucose-Capillary: 297 mg/dL — ABNORMAL HIGH (ref 65–99)
Glucose-Capillary: 247 mg/dL — ABNORMAL HIGH (ref 65–99)

## 2015-11-25 MED ORDER — POTASSIUM CHLORIDE CRYS ER 20 MEQ PO TBCR
40.0000 meq | EXTENDED_RELEASE_TABLET | Freq: Once | ORAL | Status: AC
  Administered 2015-11-25: 40 meq via ORAL
  Filled 2015-11-25: qty 2

## 2015-11-25 MED ORDER — INSULIN ASPART 100 UNIT/ML ~~LOC~~ SOLN
0.0000 [IU] | Freq: Every day | SUBCUTANEOUS | Status: DC
  Administered 2015-11-25: 2 [IU] via SUBCUTANEOUS
  Administered 2015-11-26: 3 [IU] via SUBCUTANEOUS

## 2015-11-25 MED ORDER — METHYLPREDNISOLONE SODIUM SUCC 40 MG IJ SOLR
40.0000 mg | Freq: Two times a day (BID) | INTRAMUSCULAR | Status: DC
  Administered 2015-11-25 – 2015-11-27 (×4): 40 mg via INTRAVENOUS
  Filled 2015-11-25 (×4): qty 1

## 2015-11-25 MED ORDER — INSULIN ASPART 100 UNIT/ML ~~LOC~~ SOLN
0.0000 [IU] | Freq: Three times a day (TID) | SUBCUTANEOUS | Status: DC
  Administered 2015-11-25: 8 [IU] via SUBCUTANEOUS
  Administered 2015-11-25: 3 [IU] via SUBCUTANEOUS
  Administered 2015-11-25: 8 [IU] via SUBCUTANEOUS
  Administered 2015-11-26 (×2): 3 [IU] via SUBCUTANEOUS
  Administered 2015-11-26 – 2015-11-27 (×2): 5 [IU] via SUBCUTANEOUS
  Administered 2015-11-27: 3 [IU] via SUBCUTANEOUS

## 2015-11-25 NOTE — Evaluation (Signed)
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 Pt currently with functional limitations due to the deficits listed below (see PT Problem List).  Pt will benefit from skilled PT to increase their independence and safety with mobility to allow discharge to the venue listed below.   Pt reports his wife is in hospital s/p TKA and to d/c home today.  Pt has other family whom live with him and his wife and can assist as needed.  Pt limited by dyspnea and decreased endurance today and reports this has been ongoing for about a week.     Follow Up Recommendations Home health PT (depending on progress)    Equipment Recommendations  None recommended by PT    Recommendations for Other Services       Precautions / Restrictions Precautions Precautions: Fall Precaution Comments: chronic 3L O2      Mobility  Bed Mobility Overal bed mobility: Modified Independent                Transfers Overall transfer level: Needs assistance Equipment used: None Transfers: Sit to/from Stand Sit to Stand: Min guard         General transfer comment: min/guard for safety  Ambulation/Gait Ambulation/Gait assistance: Min guard Ambulation Distance (Feet): 150  Feet Assistive device: None Gait Pattern/deviations: Step-through pattern;Decreased stride length     General Gait Details: slow but steady gait, pt pushed IV pole, remained on 3L O2, pt reports 3/4 dyspnea  Stairs            Wheelchair Mobility    Modified Rankin (Stroke Patients Only)       Balance                                             Pertinent Vitals/Pain Pain Assessment: 0-10 Pain Score: 5  Pain Location: chest hurts with coughing Pain Descriptors / Indicators: Discomfort Pain Intervention(s): Limited activity within patient's tolerance;Monitored during session;Repositioned    Home Living Family/patient expects to be discharged to:: Private residence Living Arrangements: Spouse/significant other Available Help at Discharge: Family;Available 24 hours/day Type of Home: House Home Access: Stairs to enter   [employer] of Steps: 4 Home Layout: One level Home Equipment: None Additional Comments: chronic O2    Prior Function Level of Independence: Independent               Hand Dominance        Extremity/Trunk Assessment   Upper Extremity Assessment: Overall WFL for tasks assessed           Lower Extremity Assessment: Overall WFL for tasks assessed         Communication   Communication: No difficulties  Cognition Arousal/Alertness: Awake/alert  Behavior During Therapy: WFL for tasks assessed/performed Overall Cognitive Status: Within Functional Limits for tasks assessed                      General Comments      Exercises     Assessment/Plan    PT Assessment Patient needs continued PT services  PT Problem List Decreased activity tolerance;Decreased mobility;Cardiopulmonary status limiting activity          PT Treatment Interventions DME instruction;Gait training;Therapeutic exercise;Therapeutic activities;Stair training;Functional mobility training;Patient/family education    PT  Goals (Current goals can be found in the Care Plan section)  Acute Rehab PT Goals PT Goal Formulation: With patient Time For Goal Achievement: 12/02/15 Potential to Achieve Goals: Good    Frequency Min 3X/week   Barriers to discharge        Co-evaluation               End of Session Equipment Utilized During Treatment: Gait belt;Oxygen Activity Tolerance: Patient limited by fatigue Patient left: in chair;with call bell/phone within reach           Time: 1050-1111 PT Time Calculation (min) (ACUTE ONLY): 21 min   Charges:   PT Evaluation $PT Eval Low Complexity: 1 Procedure     PT G Codes:        Sukhman Martine,KATHrine E 11/25/2015, 12:08 PM Carmelia Bake, PT, DPT 11/25/2015 Pager: [phone]

## 2015-11-25 NOTE — Progress Notes (Addendum)
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    Patient presented to [facility] with increasing shortness of breath, cough productive of yellowish sputum, worsening over past 24 hours prior to this admission. Patient was visiting his wife in the hospital and had a more labored breathing and when he checked his oxygen he was in the low 70's. Patient was subsequently transferred to ED for evaluation.   In ED, T max 102.9 F, oxygen saturation 85% with 5 L nasal cannula oxygen support. Lactive acid level 3.8. Procalcitonin was 0.68. Chest x-ray showed interstitial prominence without frank edema or consolidation.  CT Angio chest is positive for pulmonary embolism, single subsegmental embolism to the lingula new from May 2017 with mild infectious or inflammatory airspace disease in the left lower lobe.  He continues to feel Daymien Goth of breath on azithromycin and Rocephin.  Continue Lovenox.    Assessment & Plan:   Principal Problem:   Acute on chronic respiratory failure with hypoxia (HCC) Active Problems:   COPD exacerbation (HCC)   Sepsis due to pneumonia (HCC)   Leukocytosis   Anemia of chronic disease   Acute pulmonary embolism (HCC)   Thrombocytopenia (HCC)   History of bone marrow transplant ([place])   Depression   Chronic combined systolic and diastolic CHF (congestive heart  failure) ([place])   Controlled type 2 diabetes mellitus without complication, without long-term current use of insulin (HCC)  Acute on chronic respiratory failure with hypoxia (HCC) due to COPD exacerbation (HCC) and community-acquired pneumonia and small pulmonary embolism - Currently on 3 L nasal cannula - Continue DuoNeb 3 times daily scheduled and every 4 hours as needed for shortness of breath or wheezing -  Solumedrol twice a day  Active Problems:   Sepsis due to pneumonia ([place]) / Leukocytosis - Sepsis criteria met on the admission with fever, tachycardia, tachypnea, hypoxia, leukocytosis and lactic acidosis -  Lactic acid resolved -  blood culture NGTD - Sputum culture pending - Influenza panel negative - Streptococcus antigen negative    Acute pulmonary embolism (HCC) / history of DVT and PE - Single subsegmental embolism to the lingula new from May 2017. Patient apparently had history of GI bleed in 2013 but non-since then however his blood thinner was stopped at that time. He doesn't have IVC filter placed.  - continue lovenox -  No evidence of bleeding - LE doppler negative    Anemia of chronic disease / Thrombocytopenia ([place]), hemoglobin and platelet count trending down may be hemodilution - In pt with history of bone marrow transplant ([place]), ALL - Continue to monitor daily CBC    Chronic combined systolic and diastolic CHF - Last 2-D echo in 2016 with ejection fraction 30% and grade 2 diastolic dysfunction - Compensated - continue torsemide    Diabetes mellitus without complications without long-term insulin use - Patient takes metformin at home but in hospital he is on sliding scale insulin  History of bone marrow transplant ([place]) / ALL - Continue tacrolimus    Depression - Not depressed at this time - Continue Lexapro  Hypokalemia, oral potassium repletion   DVT prophylaxis: Lovenox subcutaneous for pulmonary embolism Code Status: full code    Family Communication: No family at the bedside Disposition Plan: Home likely by 11/26/2015.  PT recommending [facility].    Consultants:   none  Procedures:  none  Antimicrobials:  Antibiotics Given (last 72 hours)    Date/Time Action Medication Dose Rate   11/24/15 1722 Given   cefTRIAXone (ROCEPHIN) 1 g in dextrose 5 % 50 mL IVPB 1 g 100 mL/hr   11/24/15 1723 Given   azithromycin (ZITHROMAX) 500 mg in dextrose 5 % 250 mL IVPB 500 mg 250 mL/hr   11/25/15 1752 Given   cefTRIAXone (ROCEPHIN) 1 g in dextrose 5 % 50 mL IVPB 1 g 100 mL/hr   11/25/15 1819 Given   azithromycin (ZITHROMAX) 500 mg in dextrose 5 % 250 mL IVPB 500 mg 250 mL/hr           Subjective:  Feels just as back as yesterday.  No improvement.  Very dyspneic moving from bed to chair. Chills are improving had frequent bowel movements for the last 24 hours after stool softeners  Objective: Vitals:   11/25/15 0531 11/25/15 0919 11/25/15 0922 11/25/15 1514  BP: 133/80   139/73  Pulse: 76   96  Resp: 20   19  Temp: 97.7 F (36.5 C)   98.2 F (36.8 C)  TempSrc: Oral   Oral  SpO2: 100% 98% 98% 98%  Weight:      Height:        Intake/Output Summary (Last 24 hours) at 11/25/15 1825 Last data filed at 11/25/15 1634  Gross per 24 hour  Intake              360 ml  Output             3501 ml  Net            -3141 ml   Filed Weights   11/23/15 1759 11/24/15 0123  Weight: 92.1 kg (203 lb) 96.6 kg (213 lb)    Examination:  General exam:  Adult Male.  No acute distress.  HEENT:  NCAT, MMM Respiratory system:   Diminished bilateral breath sounds with rales, rhonchi, and wheeze Cardiovascular system: Regular rate and rhythm, normal S1/S2. No murmurs, rubs, gallops or clicks.  Warm extremities Gastrointestinal system: Normal active bowel sounds, soft, nondistended, nontender. MSK:  Normal tone and bulk, 1+ bilateral pitting lower extremity edema Neuro:  Grossly intact    Data Reviewed: I have personally  reviewed following labs and imaging studies  CBC:  Recent Labs Lab 11/23/15 1711 11/25/15 0402  WBC 11.9* 13.3*  HGB 10.1* 8.4*  HCT 31.4* 25.6*  MCV 104.7* 103.6*  PLT 109* 86*   Basic Metabolic Panel:  Recent Labs Lab 11/23/15 1711 11/25/15 0402  NA 138 141  K 3.8 3.3*  CL 93* 99*  CO2 35* 35*  GLUCOSE 132* 273*  BUN 15 22*  CREATININE 1.17 1.10  CALCIUM 9.5 9.7   GFR: Estimated Creatinine Clearance: 80.6 mL/min (by C-G formula based on SCr of 1.1 mg/dL). Liver Function Tests: No results for input(s): AST, ALT, ALKPHOS, BILITOT, PROT, ALBUMIN in the last 168 hours. No results for input(s): LIPASE, AMYLASE in the last 168 hours. No results for input(s): AMMONIA in the last 168 hours. Coagulation  Profile:  Recent Labs Lab 11/24/15 0229  INR 1.00   Cardiac Enzymes: No results for input(s): CKTOTAL, CKMB, CKMBINDEX, TROPONINI in the last 168 hours. BNP (last 3 results) No results for input(s): PROBNP in the last 8760 hours. HbA1C: No results for input(s): HGBA1C in the last 72 hours. CBG:  Recent Labs Lab 11/24/15 1623 11/24/15 2124 11/25/15 0732 11/25/15 1142 11/25/15 1632  GLUCAP 316* 327* 174* 233* 297*   Lipid Profile: No results for input(s): CHOL, HDL, LDLCALC, TRIG, CHOLHDL, LDLDIRECT in the last 72 hours. Thyroid Function Tests: No results for input(s): TSH, T4TOTAL, FREET4, T3FREE, THYROIDAB in the last 72 hours. Anemia Panel: No results for input(s): VITAMINB12, FOLATE, FERRITIN, TIBC, IRON, RETICCTPCT in the last 72 hours. Urine analysis:    Component Value Date/Time   COLORURINE YELLOW 11/23/2015 2107   APPEARANCEUR CLEAR 11/23/2015 2107   LABSPEC 1.012 11/23/2015 2107   PHURINE 6.0 11/23/2015 2107   GLUCOSEU NEGATIVE 11/23/2015 2107   HGBUR NEGATIVE 11/23/2015 2107   BILIRUBINUR NEGATIVE 11/23/2015 2107   KETONESUR NEGATIVE 11/23/2015 2107   PROTEINUR NEGATIVE 11/23/2015 2107   UROBILINOGEN 1.0 07/18/2009 1936   NITRITE NEGATIVE  11/23/2015 2107   LEUKOCYTESUR NEGATIVE 11/23/2015 2107   Sepsis Labs: @LABRCNTIP (procalcitonin:4,lacticidven:4)  ) Recent Results (from the past 240 hour(s))  Blood Culture (routine x 2)     Status: None (Preliminary result)   Collection Time: 11/23/15  5:12 PM  Result Value Ref Range Status   Specimen Description BLOOD LEFT ANTECUBITAL  Final   Special Requests BOTTLES DRAWN AEROBIC AND ANAEROBIC 5CC  Final   Culture   Final    NO GROWTH 2 DAYS Performed at [facility]    Report Status PENDING  Incomplete  Blood Culture (routine x 2)     Status: None (Preliminary result)   Collection Time: 11/23/15  5:55 PM  Result Value Ref Range Status   Specimen Description BLOOD RIGHT ANTECUBITAL  Final   Special Requests BOTTLES DRAWN AEROBIC AND ANAEROBIC 5ML  Final   Culture   Final    NO GROWTH 2 DAYS Performed at [facility]    Report Status PENDING  Incomplete  Urine culture     Status: Abnormal (Preliminary result)   Collection Time: 11/23/15  9:07 PM  Result Value Ref Range Status   Specimen Description URINE, RANDOM  Final   Special Requests NONE  Final   Culture 20,000 COLONIES/mL ENTEROCOCCUS FAECALIS (A)  Final   Report Status PENDING  Incomplete  Culture, sputum-assessment     Status: None   Collection Time: 11/24/15  6:10 PM  Result Value Ref Range Status   Specimen Description SPUTUM  Final   Special Requests NONE  Final   Sputum evaluation THIS SPECIMEN IS ACCEPTABLE FOR SPUTUM CULTURE  Final   Report Status 11/24/2015 FINAL  Final  Culture, respiratory (NON-Expectorated)     Status: None (Preliminary result)   Collection Time: 11/24/15  6:10 PM  Result Value Ref Range Status   Specimen Description SPUTUM  Final   Special Requests NONE  Final   Gram Stain   Final    ABUNDANT WBC PRESENT,BOTH PMN AND MONONUCLEAR RARE GRAM POSITIVE COCCI IN PAIRS RARE GRAM VARIABLE ROD FEW YEAST    Culture   Final    TOO YOUNG TO READ Performed at [facility]    Report Status PENDING  Incomplete      Radiology Studies: Ct Angio Chest Pe W Or Wo Contrast  Result  Date: 11/24/2015 CLINICAL DATA:  Shortness of breath for 1 month, worsening for 4 days. History of DVT and pulmonary embolism. EXAM: CT ANGIOGRAPHY CHEST WITH CONTRAST TECHNIQUE: Multidetector CT imaging of the chest was performed using the standard protocol during bolus administration of intravenous contrast. Multiplanar CT image reconstructions and MIPs were obtained to evaluate the vascular anatomy. CONTRAST:  100 cc Isovue 370 intravenous COMPARISON:  07/09/2015 FINDINGS: Cardiovascular: Study optimized for the pulmonary arteries. There is a new subsegmental non-opacified pulmonary artery in the superior segment lingula, representative image 6:172. This is a new compared to prior. Although a branching filling defect is not seen at the more proximal bifurcation point, this vessel is convincingly non-opacified. No other embolic disease is identified. No cardiomegaly or pericardial effusion. Negative thoracic aorta. Mediastinum: No adenopathy. Porta catheter on the right with tip at the SVC level. Lungs/Pleura: Clustered indistinct nodules in the left lower lobe. Few areas of patchy ground-glass opacity in the central right lung. Right lower lobe atelectasis. Borderline generalized cylindrical bronchiectasis. No evidence of infarct. Upper abdomen: No acute findings. Hepatic steatosis. Left nephrolithiasis. Musculoskeletal: No acute or aggressive finding. Postoperative left chest wall. Critical Value/emergent results were called by telephone at the time of interpretation on 11/24/2015 at 9:32 am to Dr. Charlies Silvers, who verbally acknowledged these results. Review of the MIP images confirms the above findings. IMPRESSION: 1. Single subsegmental embolism to the lingula that is new from 07/09/2015. 2. Mild infectious or inflammatory airspace disease in the left lower lobe. 3. Right lower lobe atelectasis.  4. Hepatic steatosis and left nephrolithiasis. Electronically Signed   By: Monte Fantasia M.D.   On: 11/24/2015 09:34     Scheduled Meds: . acidophilus  1 capsule Oral Daily  . azithromycin (ZITHROMAX) 500 MG IVPB  500 mg Intravenous Q24H  . cefTRIAXone (ROCEPHIN) IVPB 1 gram/50 mL D5W  1 g Intravenous Q24H  . dextromethorphan-guaiFENesin  1 tablet Oral BID  . enoxaparin (LOVENOX) injection  100 mg Subcutaneous Q12H  . escitalopram  10 mg Oral Daily  . fluticasone  1 spray Each Nare Daily  . folic acid  1 mg Oral Daily  . insulin aspart  0-15 Units Subcutaneous TID WC  . insulin aspart  0-5 Units Subcutaneous QHS  . ipratropium-albuterol  3 mL Nebulization TID  . lactulose  20 g Oral BID  . magnesium oxide  400 mg Oral Daily  . methylPREDNISolone (SOLU-MEDROL) injection  40 mg Intravenous Q12H  . montelukast  10 mg Oral Daily  . multivitamin with minerals  1 tablet Oral Daily  . nefazodone  50 mg Oral QHS  . pantoprazole  40 mg Oral BID  . rOPINIRole  1 mg Oral QHS  . Sirolimus  0.5 mg Oral Daily  . torsemide  100 mg Oral BID  . vitamin B-12  1,000 mcg Oral Daily   Continuous Infusions:    LOS: 1 day    Time spent: 30 min    Janece Canterbury, MD [facility] Pager [phone]  If 7PM-7AM, please contact night-coverage [email or web address] Password [facility] 11/25/2015, 6:25 PM

## 2015-11-25 NOTE — Progress Notes (Signed)
Inpatient Diabetes Program Recommendations  AACE/ADA: New Consensus Statement on Inpatient Glycemic Control (2015)  Target Ranges:  Prepandial:   less than 140 mg/dL      Peak postprandial:   less than 180 mg/dL (1-2 hours)      Critically ill patients:  140 - 180 mg/dL   Lab Results  Component Value Date   GLUCAP 297 (H) 11/25/2015   HGBA1C 5.6 12/27/2014    Review of Glycemic Control  Hyperglycemia with steroids. Eating 100%.  Inpatient Diabetes Program Recommendations:    Increase Novolog to resistant tidwc and hs Add Novolog 2 units tidwc for meal coverage insulin while on steroids.  Will follow. Thank you. Lorenda Peck, RD, LDN, CDE Inpatient Diabetes Coordinator [phone]

## 2015-11-25 NOTE — Progress Notes (Signed)
S/W  [facility]ARMEN @ [employer] RX # [phone]   LOVENOX 100 MG SQ BID X 14 DAYS  * NOT COVER AND NONE FORMULARY *   ENOXAPARIN  100 MG SQ BID X 14 DAYS  COVER- YES  CO-PAY- $ 23.47 Q/L 28 FOR 28 DAYS  TIER- 4 DRUG  PRIOR APPROVAL - NO  PHARMACY : [facility] OR ANY RETAIL

## 2015-11-26 DIAGNOSIS — D696 Thrombocytopenia, unspecified: Secondary | ICD-10-CM

## 2015-11-26 DIAGNOSIS — J441 Chronic obstructive pulmonary disease with (acute) exacerbation: Secondary | ICD-10-CM

## 2015-11-26 LAB — CBC
HEMATOCRIT: 26.6 % — AB (ref 39.0–52.0)
HEMOGLOBIN: 8.7 g/dL — AB (ref 13.0–17.0)
MCH: 34 pg (ref 26.0–34.0)
MCHC: 32.7 g/dL (ref 30.0–36.0)
MCV: 103.9 fL — ABNORMAL HIGH (ref 78.0–100.0)
Platelets: 94 10*3/uL — ABNORMAL LOW (ref 150–400)
RBC: 2.56 MIL/uL — AB (ref 4.22–5.81)
RDW: 18.4 % — ABNORMAL HIGH (ref 11.5–15.5)
WBC: 11.8 10*3/uL — ABNORMAL HIGH (ref 4.0–10.5)

## 2015-11-26 LAB — BASIC METABOLIC PANEL
ANION GAP: 9 (ref 5–15)
BUN: 26 mg/dL — ABNORMAL HIGH (ref 6–20)
CHLORIDE: 97 mmol/L — AB (ref 101–111)
CO2: 38 mmol/L — AB (ref 22–32)
CREATININE: 0.93 mg/dL (ref 0.61–1.24)
Calcium: 9.3 mg/dL (ref 8.9–10.3)
GFR calc non Af Amer: >60 mL/min (ref >60)
Glucose, Bld: 194 mg/dL — ABNORMAL HIGH (ref 65–99)
POTASSIUM: 3.5 mmol/L (ref 3.5–5.1)
SODIUM: 144 mmol/L (ref 135–145)

## 2015-11-26 LAB — URINE CULTURE: Culture: 20000 — AB

## 2015-11-26 LAB — CULTURE, RESPIRATORY W GRAM STAIN: Culture: NORMAL

## 2015-11-26 LAB — GLUCOSE, CAPILLARY
GLUCOSE-CAPILLARY: 182 mg/dL — AB (ref 65–99)
GLUCOSE-CAPILLARY: 228 mg/dL — AB (ref 65–99)
GLUCOSE-CAPILLARY: 184 mg/dL — AB (ref 65–99)
GLUCOSE-CAPILLARY: 270 mg/dL — AB (ref 65–99)

## 2015-11-26 LAB — CULTURE, RESPIRATORY

## 2015-11-26 MED ORDER — DEXTROSE 5 % IV SOLN
500.0000 mg | INTRAVENOUS | Status: DC
  Administered 2015-11-26 – 2015-11-27 (×2): 500 mg via INTRAVENOUS
  Filled 2015-11-26 (×2): qty 500

## 2015-11-26 MED ORDER — INSULIN ASPART 100 UNIT/ML ~~LOC~~ SOLN
3.0000 [IU] | Freq: Three times a day (TID) | SUBCUTANEOUS | Status: DC
  Administered 2015-11-26 – 2015-11-27 (×5): 3 [IU] via SUBCUTANEOUS

## 2015-11-26 MED ORDER — APIXABAN 5 MG PO TABS
10.0000 mg | ORAL_TABLET | Freq: Two times a day (BID) | ORAL | Status: DC
  Administered 2015-11-26 – 2015-11-27 (×2): 10 mg via ORAL
  Filled 2015-11-26 (×2): qty 2

## 2015-11-26 MED ORDER — OXYMETAZOLINE HCL 0.05 % NA SOLN
3.0000 | Freq: Two times a day (BID) | NASAL | Status: DC | PRN
  Filled 2015-11-26: qty 15

## 2015-11-26 MED ORDER — BENZONATATE 100 MG PO CAPS
100.0000 mg | ORAL_CAPSULE | Freq: Three times a day (TID) | ORAL | Status: DC
  Administered 2015-11-26 – 2015-11-27 (×4): 100 mg via ORAL
  Filled 2015-11-26 (×5): qty 1

## 2015-11-26 MED ORDER — LACTULOSE 10 GM/15ML PO SOLN
20.0000 g | Freq: Every day | ORAL | Status: DC
  Administered 2015-11-27: 20 g via ORAL
  Filled 2015-11-26: qty 30

## 2015-11-26 MED ORDER — APIXABAN 5 MG PO TABS: 5.0000 mg | ORAL_TABLET | Freq: Two times a day (BID) | ORAL | Status: DC

## 2015-11-26 MED ORDER — WHITE PETROLATUM GEL
Freq: Two times a day (BID) | Status: DC
  Administered 2015-11-26: 18:00:00 via TOPICAL
  Administered 2015-11-26 – 2015-11-27 (×2): 0.2 via TOPICAL
  Filled 2015-11-26: qty 28.35

## 2015-11-26 MED ORDER — SALINE SPRAY 0.65 % NA SOLN
1.0000 | NASAL | Status: DC | PRN
  Filled 2015-11-26: qty 44

## 2015-11-26 NOTE — Progress Notes (Signed)
ANTICOAGULATION CONSULT NOTE - Initial Consult  Pharmacy Consult for lovenox --> Eliquis Indication: new pulmonary embolus  Allergies  Allergen Reactions  . Nsaids Shortness Of Breath and Swelling    Patient Measurements: Height: 5\' 6"  (167.6 cm) Weight: 208 lb 8.9 oz (94.6 kg) IBW/kg (Calculated) : 63.8 Heparin Dosing Weight:   Vital Signs: Temp: 98.1 F (36.7 C) (09/21 1340) Temp Source: Oral (09/21 1340) BP: 147/84 (09/21 1340) Pulse Rate: 87 (09/21 1427)  Labs:  Recent Labs  11/23/15 1711 11/24/15 0229 11/25/15 0402 11/26/15 0519  HGB 10.1*  --  8.4* 8.7*  HCT 31.4*  --  25.6* 26.6*  PLT 109*  --  86* 94*  APTT  --  27  --   --   LABPROT  --  13.2  --   --   INR  --  1.00  --   --   CREATININE 1.17  --  1.10 0.93    Estimated Creatinine Clearance: 94.3 mL/min (by C-G formula based on SCr of 0.93 mg/dL).   Medical History: Past Medical History:  Diagnosis Date  . Adenomatous colon polyp    tubular  . Anemia   . Anxiety   . Arthritis   . Asthma   . Bowel obstruction (HCC)    constipation  . CHF (congestive heart failure) ([place])   . COPD (chronic obstructive pulmonary disease) ([place])   . Depression   . Diverticulosis   . DVT (deep venous thrombosis) ([place])   . Gallstones   . GERD (gastroesophageal reflux disease)   . 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 History of bone marrow transplant ([place])   . 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 Personal history of colonic  adenoma 01/22/2008    Assessment: Patient is a 60 y.o M with hx ALL (s/p bone marrow transplant) and DVT/PE on anticoagulations in the past but subsequently d/ced in 2013 d/t risk for GI bleed.  He presented to the ED on 11/23/15 with c/o of SOB with CT angio showed acute PE.  Lovenox was started on admission.  To transition to Eliquis on 11/26/15.   Plan:  -  d/c lovenox - start Eliquis 10 mg PO BID x7 days, then 5 mg BID  (first dose at 10 PM tonight since LMWH dose was given at ~11AM today) - monitor for s/s bleeding   Derek Shepherd P 11/26/2015,3:21 PM

## 2015-11-26 NOTE — Progress Notes (Signed)
Pt declined HH at present time.  

## 2015-11-26 NOTE — Progress Notes (Signed)
CSW received consult for COPD Gold Protocol, though patient does not meet qualifications (only 2 admissions within the past 6 months).   CSW signing off.   Derek Shepherd, [facility] Clinical Social Worker cell #: [phone]

## 2015-11-26 NOTE — Progress Notes (Signed)
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    Patient presented to [facility] with increasing shortness of breath, cough productive of yellowish sputum, worsening over past 24 hours prior to this admission. Patient was visiting his wife in the hospital and had a more labored breathing and when he checked his oxygen he was in the low 70's. Patient was subsequently transferred to ED for evaluation.   In ED, T max 102.9 F, oxygen saturation 85% with 5 L nasal cannula oxygen support. Lactive acid level 3.8. Procalcitonin was 0.68. Chest x-ray showed interstitial prominence without frank edema or consolidation.  CT Angio chest is positive for pulmonary embolism, single subsegmental embolism to the lingula new from May 2017 with mild infectious or inflammatory airspace disease in the left lower lobe.  He continues to feel Khaled Herda of breath on azithromycin and Rocephin.  Continue Lovenox.    Assessment & Plan:   Principal Problem:   Acute on chronic respiratory failure with hypoxia (HCC) Active Problems:   COPD exacerbation (HCC)   Sepsis due to pneumonia (HCC)   Leukocytosis   Anemia of chronic disease   Acute pulmonary embolism (HCC)   Thrombocytopenia (HCC)   History of bone marrow transplant ([place])   Depression   Chronic combined systolic and diastolic CHF (congestive heart  failure) ([place])   Controlled type 2 diabetes mellitus without complication, without long-term current use of insulin (HCC)  Acute on chronic respiratory failure with hypoxia (HCC) due to COPD exacerbation (HCC) and community-acquired pneumonia and small pulmonary embolism -  Wean oxygen as tolerated - Continue DuoNeb 3 times daily scheduled and every 4 hours as needed for shortness of breath or wheezing - Continue Solumedrol twice a day -  Place on continuous pulse ox.  Active Problems:   Sepsis due to pneumonia ([place]) / Leukocytosis.  Urine likely colonized with enterococcus. - Sepsis criteria met on the admission with fever, tachycardia, tachypnea, hypoxia, leukocytosis and lactic acidosis - Lactic acid resolved - blood culture NGTD - Sputum culture pending - Influenza panel negative - Streptococcus antigen negative -  Check legionella antigen -  Tele:  SR.  Okay to d/c telemetry. -  Repeat CXR in AM  Epistaxis, mild -  Start nasal saline, vaseline -  Cold compresses x 15 minutes prn -  Afrin prn ongoing nose bleed.    Acute subsegmental pulmonary embolism (HCC) / history of DVT and PE, likely caused by pneumonia - Single subsegmental embolism to the lingula new from May 2017. Patient apparently had history of GI bleed in 2013 but non-since then however his blood thinner was stopped at that time. He doesn't have IVC filter placed.  -  Transition to apixaban from lovenox -  No evidence of bleeding - LE doppler negative    Anemia of chronic disease / Thrombocytopenia ([place]), hemoglobin and platelet count trending down may be hemodilution - In pt with history of bone marrow  transplant ([place]), ALL - Continue to monitor daily CBC    Chronic combined systolic and diastolic CHF - Last 2-D echo in 2016 with ejection fraction 30% and grade 2 diastolic dysfunction - Compensated - continue torsemide    Diabetes mellitus without complications without long-term insulin use - Patient  takes metformin at home but in hospital he is on sliding scale insulin    History of bone marrow transplant ([place]) / ALL, in remission - Continue tacrolimus    Depression - Not depressed at this time - Continue Lexapro  Hypokalemia, oral potassium repletion   DVT prophylaxis: apixaban Code Status: full code  Family Communication: No family at the bedside Disposition Plan:  still very ill appearing.  Continue antibiotics and hospitalization.    Consultants:   none  Procedures:  none  Antimicrobials:  Antibiotics Given (last 72 hours)    Date/Time Action Medication Dose Rate   11/24/15 1722 Given   cefTRIAXone (ROCEPHIN) 1 g in dextrose 5 % 50 mL IVPB 1 g 100 mL/hr   11/24/15 1723 Given   azithromycin (ZITHROMAX) 500 mg in dextrose 5 % 250 mL IVPB 500 mg 250 mL/hr   11/25/15 1752 Given   cefTRIAXone (ROCEPHIN) 1 g in dextrose 5 % 50 mL IVPB 1 g 100 mL/hr   11/25/15 1819 Given   azithromycin (ZITHROMAX) 500 mg in dextrose 5 % 250 mL IVPB 500 mg 250 mL/hr   11/26/15 1116 Given   azithromycin (ZITHROMAX) 500 mg in dextrose 5 % 250 mL IVPB 500 mg 250 mL/hr          Subjective:  Still feels terrible.  Ongoing cough.   No improvement.  Ongoing chills.    Objective: Vitals:   11/25/15 2011 11/26/15 0531 11/26/15 1340 11/26/15 1427  BP:  (!) 151/80 (!) 147/84   Pulse:  90 82 87  Resp:  18 (!) 22 18  Temp:  98.1 F (36.7 C) 98.1 F (36.7 C)   TempSrc:  Oral Oral   SpO2: 98% 97% 97% 97%  Weight:  94.6 kg (208 lb 8.9 oz)    Height:        Intake/Output Summary (Last 24 hours) at 11/26/15 1505 Last data filed at 11/26/15 1340  Gross per 24 hour  Intake             1680 ml  Output             3650 ml  Net            -1970 ml   Filed Weights   11/23/15 1759 11/24/15 0123 11/26/15 0531  Weight: 92.1 kg (203 lb) 96.6 kg (213 lb) 94.6 kg (208 lb 8.9 oz)    Examination:  General exam:  Adult Male.  Lying in bed, tired appearing.   HEENT:  NCAT,  MMM Respiratory system:   Diminished bilateral breath sounds with rales, rhonchi, and wheeze Cardiovascular system: Regular rate and rhythm, normal S1/S2. No murmurs, rubs, gallops or clicks.  Warm extremities Gastrointestinal system: Normal active bowel sounds, soft, nondistended, nontender. MSK:  Normal tone and bulk, 1+ bilateral pitting lower extremity edema Neuro:  Grossly intact    Data Reviewed: I have personally reviewed following labs and imaging studies  CBC:  Recent Labs Lab 11/23/15 1711 11/25/15 0402 11/26/15 0519  WBC 11.9* 13.3* 11.8*  HGB 10.1* 8.4* 8.7*  HCT 31.4* 25.6* 26.6*  MCV 104.7* 103.6* 103.9*  PLT 109* 86* 94*   Basic Metabolic Panel:  Recent Labs  Lab 11/23/15 1711 11/25/15 0402 11/26/15 0519  NA 138 141 144  K 3.8 3.3* 3.5  CL 93* 99* 97*  CO2 35* 35* 38*  GLUCOSE 132* 273* 194*  BUN 15 22* 26*  CREATININE 1.17 1.10 0.93  CALCIUM 9.5 9.7 9.3   GFR: Estimated Creatinine Clearance: 94.3 mL/min (by C-G formula based on SCr of 0.93 mg/dL). Liver Function Tests: No results for input(s): AST, ALT, ALKPHOS, BILITOT, PROT, ALBUMIN in the last 168 hours. No results for input(s): LIPASE, AMYLASE in the last 168 hours. No results for input(s): AMMONIA in the last 168 hours. Coagulation Profile:  Recent Labs Lab 11/24/15 0229  INR 1.00   Cardiac Enzymes: No results for input(s): CKTOTAL, CKMB, CKMBINDEX, TROPONINI in the last 168 hours. BNP (last 3 results) No results for input(s): PROBNP in the last 8760 hours. HbA1C: No results for input(s): HGBA1C in the last 72 hours. CBG:  Recent Labs Lab 11/25/15 1142 11/25/15 1632 11/25/15 2216 11/26/15 0801 11/26/15 1202  GLUCAP 233* 297* 247* 182* 228*   Lipid Profile: No results for input(s): CHOL, HDL, LDLCALC, TRIG, CHOLHDL, LDLDIRECT in the last 72 hours. Thyroid Function Tests: No results for input(s): TSH, T4TOTAL, FREET4, T3FREE, THYROIDAB in the last 72 hours. Anemia Panel: No  results for input(s): VITAMINB12, FOLATE, FERRITIN, TIBC, IRON, RETICCTPCT in the last 72 hours. Urine analysis:    Component Value Date/Time   COLORURINE YELLOW 11/23/2015 2107   APPEARANCEUR CLEAR 11/23/2015 2107   LABSPEC 1.012 11/23/2015 2107   PHURINE 6.0 11/23/2015 2107   GLUCOSEU NEGATIVE 11/23/2015 2107   HGBUR NEGATIVE 11/23/2015 2107   BILIRUBINUR NEGATIVE 11/23/2015 2107   KETONESUR NEGATIVE 11/23/2015 2107   PROTEINUR NEGATIVE 11/23/2015 2107   UROBILINOGEN 1.0 07/18/2009 1936   NITRITE NEGATIVE 11/23/2015 2107   LEUKOCYTESUR NEGATIVE 11/23/2015 2107   Sepsis Labs: _0 (procalcitonin:4,lacticidven:4)  ) Recent Results (from the past 240 hour(s))  Blood Culture (routine x 2)     Status: None (Preliminary result)   Collection Time: 11/23/15  5:12 PM  Result Value Ref Range Status   Specimen Description BLOOD LEFT ANTECUBITAL  Final   Special Requests BOTTLES DRAWN AEROBIC AND ANAEROBIC 5CC  Final   Culture   Final    NO GROWTH 2 DAYS Performed at [facility]    Report Status PENDING  Incomplete  Blood Culture (routine x 2)     Status: None (Preliminary result)   Collection Time: 11/23/15  5:55 PM  Result Value Ref Range Status   Specimen Description BLOOD RIGHT ANTECUBITAL  Final   Special Requests BOTTLES DRAWN AEROBIC AND ANAEROBIC 5ML  Final   Culture   Final    NO GROWTH 2 DAYS Performed at [facility]    Report Status PENDING  Incomplete  Urine culture     Status: Abnormal   Collection Time: 11/23/15  9:07 PM  Result Value Ref Range Status   Specimen Description URINE, RANDOM  Final   Special Requests NONE  Final   Culture 20,000 COLONIES/mL ENTEROCOCCUS FAECALIS (A)  Final   Report Status 11/26/2015 FINAL  Final   Organism ID, Bacteria ENTEROCOCCUS FAECALIS (A)  Final      Susceptibility   Enterococcus faecalis - MIC*    AMPICILLIN <=2 SENSITIVE Sensitive     LEVOFLOXACIN 1 SENSITIVE Sensitive     NITROFURANTOIN <=16  SENSITIVE Sensitive     VANCOMYCIN 1 SENSITIVE Sensitive     * 20,000 COLONIES/mL ENTEROCOCCUS FAECALIS  Culture, sputum-assessment  Status: None   Collection Time: 11/24/15  6:10 PM  Result Value Ref Range Status   Specimen Description SPUTUM  Final   Special Requests NONE  Final   Sputum evaluation THIS SPECIMEN IS ACCEPTABLE FOR SPUTUM CULTURE  Final   Report Status 11/24/2015 FINAL  Final  Culture, respiratory (NON-Expectorated)     Status: None   Collection Time: 11/24/15  6:10 PM  Result Value Ref Range Status   Specimen Description SPUTUM  Final   Special Requests NONE  Final   Gram Stain   Final    ABUNDANT WBC PRESENT,BOTH PMN AND MONONUCLEAR RARE GRAM POSITIVE COCCI IN PAIRS RARE GRAM VARIABLE ROD FEW YEAST    Culture   Final    Consistent with normal respiratory flora. Performed at [facility]    Report Status 11/26/2015 FINAL  Final      Radiology Studies: No results found.   Scheduled Meds: . acidophilus  1 capsule Oral Daily  . azithromycin (ZITHROMAX) 500 MG IVPB  500 mg Intravenous Q24H  . benzonatate  100 mg Oral TID  . cefTRIAXone (ROCEPHIN) IVPB 1 gram/50 mL D5W  1 g Intravenous Q24H  . dextromethorphan-guaiFENesin  1 tablet Oral BID  . enoxaparin (LOVENOX) injection  100 mg Subcutaneous Q12H  . escitalopram  10 mg Oral Daily  . fluticasone  1 spray Each Nare Daily  . folic acid  1 mg Oral Daily  . insulin aspart  0-15 Units Subcutaneous TID WC  . insulin aspart  0-5 Units Subcutaneous QHS  . insulin aspart  3 Units Subcutaneous TID WC  . ipratropium-albuterol  3 mL Nebulization TID  . lactulose  20 g Oral BID  . magnesium oxide  400 mg Oral Daily  . methylPREDNISolone (SOLU-MEDROL) injection  40 mg Intravenous Q12H  . montelukast  10 mg Oral Daily  . multivitamin with minerals  1 tablet Oral Daily  . nefazodone  50 mg Oral QHS  . pantoprazole  40 mg Oral BID  . rOPINIRole  1 mg Oral QHS  . Sirolimus  0.5 mg Oral Daily  . torsemide   100 mg Oral BID  . vitamin B-12  1,000 mcg Oral Daily  . white petrolatum   Topical BID   Continuous Infusions:    LOS: 2 days    Time spent: 30 min    Janece Canterbury, MD [facility] Pager [phone]  If 7PM-7AM, please contact night-coverage [email or web address] Password [email or web address] 11/26/2015, 3:05 PM

## 2015-11-26 NOTE — Progress Notes (Signed)
OT Cancellation Note  Patient Details Name: UZIAH ORENDER MRN: [id number] DOB: [birth year]   Cancelled Treatment:    Reason Eval/Treat Not Completed: Fatigue/lethargy limiting ability to participate  Pt would like OT to return next day Kari Baars, [place]  Payton Mccallum D 11/26/2015, 1:31 PM

## 2015-11-27 ENCOUNTER — Inpatient Hospital Stay (HOSPITAL_COMMUNITY): Payer: Commercial Managed Care - HMO

## 2015-11-27 ENCOUNTER — Other Ambulatory Visit: Payer: Self-pay

## 2015-11-27 DIAGNOSIS — D638 Anemia in other chronic diseases classified elsewhere: Secondary | ICD-10-CM

## 2015-11-27 LAB — GLUCOSE, CAPILLARY
GLUCOSE-CAPILLARY: 169 mg/dL — AB (ref 65–99)
GLUCOSE-CAPILLARY: 205 mg/dL — AB (ref 65–99)

## 2015-11-27 LAB — BASIC METABOLIC PANEL
ANION GAP: 10 (ref 5–15)
BUN: 26 mg/dL — AB (ref 6–20)
CHLORIDE: 94 mmol/L — AB (ref 101–111)
CO2: 39 mmol/L — ABNORMAL HIGH (ref 22–32)
Calcium: 9.4 mg/dL (ref 8.9–10.3)
Creatinine, Ser: 0.97 mg/dL (ref 0.61–1.24)
GFR calc Af Amer: >60 mL/min (ref >60)
GFR calc non Af Amer: >60 mL/min (ref >60)
GLUCOSE: 202 mg/dL — AB (ref 65–99)
POTASSIUM: 3.7 mmol/L (ref 3.5–5.1)
Sodium: 143 mmol/L (ref 135–145)

## 2015-11-27 MED ORDER — DOXYCYCLINE HYCLATE 100 MG PO CAPS: 100.0000 mg | ORAL_CAPSULE | Freq: Two times a day (BID) | ORAL | 0 refills | Status: DC

## 2015-11-27 MED ORDER — FUROSEMIDE 10 MG/ML IJ SOLN: 80.0000 mg | Freq: Once | INTRAMUSCULAR | Status: DC

## 2015-11-27 MED ORDER — APIXABAN 5 MG PO TABS: ORAL_TABLET | ORAL | 0 refills | Status: DC

## 2015-11-27 MED ORDER — PREDNISONE 50 MG PO TABS: 50.0000 mg | ORAL_TABLET | Freq: Every day | ORAL | 0 refills | Status: DC

## 2015-11-27 MED ORDER — GLUCOSE BLOOD VI STRP: ORAL_STRIP | 1 refills | Status: DC

## 2015-11-27 NOTE — Evaluation (Signed)
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, history of DVT and PE and subsequent discontinuation of blood thinners because of risk of GI bleed in 2013, history of aspergillosis in 2012, chronic combined systolic and diastolic CHF and admitted for acute respiratory failure with hypoxia Likely secondary to combination of pneumonia, COPD as well as pulmonary embolism   Clinical Impression   Pt feels he is at baseline for his ADL activity    Follow Up Recommendations  No OT follow up    Equipment Recommendations  None recommended by OT    Recommendations for Other Services       Precautions / Restrictions Precautions Precautions: Fall Precaution Comments: chronic 3L O2 Restrictions Weight Bearing Restrictions: No      Mobility Bed Mobility Overal bed mobility: Modified Independent                Transfers Overall transfer level: Needs assistance Equipment used: None;Rolling walker (2 wheeled) Transfers: Sit to/from Stand Sit to Stand: Supervision              Balance                                            ADL Overall ADL's : At baseline                                       General ADL Comments: Pt feels like he is at baseline level of function.  Pt did tell OT donning socks was hard. Explained use of sock aid to pt and reacher. Pt educated on AE. Pt appreciative.               Communication Communication Communication: No difficulties   Cognition                                      Home Living Family/patient expects to be  discharged to:: Private residence Living Arrangements: Spouse/significant other Available Help at Discharge: Family;Available 24 hours/day Type of Home: House Home Access: Stairs to enter [employer] of Steps: 4   Home Layout: One level               Home Equipment: None   Additional Comments: chronic O2      Prior Functioning/Environment Level of Independence: Independent                                        End of Session Equipment Utilized During Treatment: Oxygen Nurse Communication: Mobility status  Activity Tolerance: Patient tolerated treatment well Patient left: in chair   Time: 1125-1156 OT Time Calculation (min): 31 min Charges:  OT General Charges $OT Visit: 1 Procedure OT Evaluation $OT Eval Moderate Complexity: 1 Procedure OT Treatments $Self Care/Home Management : 8-22 mins G-Codes:  Betsy Pries 11/27/2015, 1:00 PM

## 2015-11-27 NOTE — Discharge Summary (Signed)
Physician Discharge Summary  Derek Shepherd BSJ:[id number] DOB: 1955-05-17 DOA: 11/23/2015  PCP: Claretta Fraise, MD  Admit date: 11/23/2015 Discharge date: 11/27/2015  Admitted From: home  Disposition:  home  Recommendations for Outpatient Follow-up:  1. Follow up with PCP in 1 weeks 2. Please obtain BMP/CBC in one week 3. 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    [facility] of [facility], cough productive of yellowish sputum, worsening over past 24 hours prior to this admission.  In ED, T max 102.9 F, oxygen saturation 85% with 5 L nasal cannula. Chest x-ray showed interstitial prominence without frank edema or consolidation.  CT Angio chest positive for single subsegmental pulmonary embolism to the lingula new from May 2017 with infectious or inflammatory airspace disease in the left lower lobe.  He was started on azithromycin and Rocephin, steroids, and lovenox.    He gradually had improvement in his dyspnea over the course of four days.  On the day of discharge, he was on 2-3 L nasal canula, less than his home amount and able to ambulate in the hall and to the  bathroom without significant dyspnea.  He was transitioned to oral antibiotics and prednisone to continue at  Home.  I suspect that his very small PE may have either been artifact from his associated pneumonia or related to his underlying infection.  Recommend 3-6 months of anticoagulation with apixaban and reassess at that time to determine if there is an indication to continue anticoagulation beyond that time.   Discharge Diagnoses:  Principal Problem:   Acute on chronic respiratory failure with hypoxia (HCC) Active Problems:   COPD exacerbation (HCC)   Sepsis due to pneumonia (HCC)   Leukocytosis   Anemia of chronic disease   Acute pulmonary embolism (HCC)   Thrombocytopenia (HCC)   History of bone marrow transplant ([place])   Depression   Chronic combined systolic and diastolic CHF (congestive heart failure) ([place])   Controlled type 2 diabetes mellitus without complication, without long-term current use of insulin (HCC)  Acute on chronic respiratory failure with hypoxia (HCC) due toCOPD exacerbation (HCC) and community-acquired pneumonia and small pulmonary embolism.  Blood cultures remain negative.  Sputum culture grew normal flora.  Streptococcus antigen was negative.  Legionella ag pending.   -  continue 3L [place] at discharge (same as prior to admission) -  Prednisone burst -  Doxycycline to continue for a total of 7 days of antibiotics  Sepsis due to pneumonia ([place]) /Leukocytosis.  Urine likely colonized with enterococcus.  Sepsis criteria met on the admission with fever, tachycardia, tachypnea, hypoxia, leukocytosis and lactic acidosis.  Sepsis physiology resolved with IV fluids and antibiotics.  Epistaxis, mild and resolved with nasal saline, vaseline.  Acute subsegmental pulmonary  embolism (HCC) / history of DVT and PE, likely caused by pneumonia - Hx GI bleed in 2013 but none since then -  apixaban for 3-6 months -  No evidence of bleeding - LE doppler  negative  Anemia of chronic disease /Thrombocytopenia ([place]), hemoglobin and platelet count similar to August.   - In pt with history of bone marrow transplant ([place]), ALL - repeat CBC as outpatient  Chronic combined systolic and diastolic CHF - Last 2-D echo in 2016 with ejection fraction 30% and grade 2 diastolic dysfunction - continued torsemide  Diabetes mellitus without complications without long-term insulin use, given SSI during hospitalization - resumed metformin  History of bone marrow transplant ([place]) / ALL, in remission - Continued tacrolimus  Depression, stable - Continue Lexapro  Hypokalemia, oral potassium repletion  Discharge Instructions  Discharge Instructions    (HEART FAILURE PATIENTS) Call MD:  Anytime you have any of the following symptoms: 1) 3 pound weight gain in 24 hours or 5 pounds in 1 week 2) shortness of breath, with or without a dry hacking cough 3) swelling in the hands, feet or stomach 4) if you have to sleep on extra pillows at night in order to breathe.    Complete by:  As directed    Call MD for:  difficulty breathing, headache or visual disturbances    Complete by:  As directed    Call MD for:  extreme fatigue    Complete by:  As directed    Call MD for:  hives    Complete by:  As directed    Call MD for:  persistant dizziness or light-headedness    Complete by:  As directed    Call MD for:  persistant nausea and vomiting    Complete by:  As directed    Call MD for:  severe uncontrolled pain    Complete by:  As directed    Call MD for:  temperature >100.4    Complete by:  As directed    Diet - low sodium heart healthy    Complete by:  As directed    Face-to-face encounter (required for Medicare/Medicaid patients)    Complete by:  As directed    I Aribella Vavra, [place] certify that this patient is under my care and that I, or a nurse practitioner or physician's assistant working with me, had a face-to-face encounter that meets  the physician face-to-face encounter requirements with this patient on 11/27/2015. The encounter with the patient was in whole, or in part for the following medical condition(s) which is the primary reason for home health care (List medical condition): 60 yo M with COPD, CHF who was admitted with PE, pneumonia, and COPD exacerbation.  Would benefit from [facility] RN for disease management of above.   The encounter with the patient was in whole, or in part, for the following medical condition, which is the primary reason for home health care:  yes   I certify that, based on my findings, the following services are medically necessary home health services:  Nursing   Reason for Medically Necessary Home Health Services:   Skilled Nursing- Skilled Assessment/Observation Skilled Nursing- Teaching of Disease Process/Symptom Management     My clinical findings support the need for the above services:  Shortness of breath with activity   Further, I certify that my clinical findings support that this patient is homebound due to:  Shortness of Breath with activity   Home Health    Complete by:  As  directed    To provide the following care/treatments:  RN   Increase activity slowly    Complete by:  As directed        Medication List    STOP taking these medications   cefpodoxime 200 MG tablet Commonly known as:  VANTIN     TAKE these medications   albuterol (2.5 MG/3ML) 0.083% nebulizer solution Commonly known as:  PROVENTIL Take 3 mLs (2.5 mg total) by nebulization every 6 (six) hours as needed for wheezing or shortness of breath.   aMILoride 5 MG tablet Commonly known as:  MIDAMOR Take 1 tablet by mouth daily.   Ankle Lace-Up Brace Misc Wear daily for two weeks then as needed ASO  Style brace   apixaban 5 MG Tabs tablet Commonly known as:  ELIQUIS Take two tabs twice daily through 12/03/2015, then start 1 tab twice daily on 9/29.   doxycycline 100 MG capsule Commonly known as:  VIBRAMYCIN Take  1 capsule (100 mg total) by mouth 2 (two) times daily.   escitalopram 10 MG tablet Commonly known as:  LEXAPRO TAKE ONE TABLET BY MOUTH ONCE DAILY FOR DEPRESSION   fluticasone 50 MCG/ACT nasal spray Commonly known as:  FLONASE Place 1 spray into both nostrils daily.   folic acid 1 MG tablet Commonly known as:  FOLVITE Take 1 mg by mouth daily.   HYDROmorphone 4 MG tablet Commonly known as:  DILAUDID Take 4 mg by mouth 4 (four) times daily as needed for severe pain. Start taking on:  12/05/2015   lactulose 10 GM/15ML solution Commonly known as:  CHRONULAC Take 30 mLs (20 g total) by mouth 2 (two) times daily. For constipation   levalbuterol 1.25 MG/0.5ML nebulizer solution Commonly known as:  XOPENEX Take 1.25 mg by nebulization 4 (four) times daily.   Magnesium Chloride-Calcium 64-106 MG Tbec Take 2 tablets by mouth daily.   metFORMIN 500 MG tablet Commonly known as:  GLUCOPHAGE Take 1 tablet (500 mg total) by mouth 2 (two) times daily with a meal.   montelukast 10 MG tablet Commonly known as:  SINGULAIR Take 10 mg by mouth daily.   Multiple Vitamins tablet Take 1 tablet by mouth daily.   nefazodone 50 MG tablet Commonly known as:  SERZONE Take 50 mg by mouth at bedtime.   ONE TOUCH ULTRA TEST test strip Generic drug:  glucose blood   ONE TOUCH ULTRA TEST test strip Generic drug:  glucose blood USE ONE STRIP TO CHECK GLUCOSE ONCE DAILY IN THE MORNING FOR FASTING BLOOD SUGAR   OXYGEN Inhale 3-5 L into the lungs continuous. Continuous 5 LPM when up and about 3 LPM constant and at night   pantoprazole 40 MG tablet Commonly known as:  PROTONIX Take 40 mg by mouth 2 (two) times daily.   polyethylene glycol powder powder Commonly known as:  GLYCOLAX/MIRALAX Take 17 g by mouth 2 (two) times daily as needed. What changed:  reasons to take this   potassium chloride 10 MEQ CR capsule Commonly known as:  MICRO-K TAKE THREE CAPSULES BY MOUTH TWICE DAILY    predniSONE 50 MG tablet Commonly known as:  DELTASONE Take 1 tablet (50 mg total) by mouth daily with breakfast.   rOPINIRole 1 MG tablet Commonly known as:  REQUIP TAKE 1 TABLET BY MOUTH AT BEDTIME   Sirolimus 0.5 MG Tabs Take 0.5 mg by mouth daily.   SPIRIVA HANDIHALER 18 MCG inhalation capsule Generic drug:  tiotropium PLACE THE CONTENTS OF ONE CAPSULE INTO  INHALER AND INHALE ONCE DAILY   SYMBICORT 160-4.5 MCG/ACT inhaler Generic drug:  budesonide-formoterol INHALE TWO PUFFS BY MOUTH ONCE DAILY IN THE MORNING   SYSTANE PRESERVATIVE FREE 0.4-0.3 % Soln Generic drug:  Polyethyl Glycol-Propyl Glycol Place 1 drop into both eyes daily as needed. For dry eyes   torsemide 100 MG tablet Commonly known as:  DEMADEX Take 2 tablets (200 mg total) by mouth daily. What changed:  how much to take  when to take this   TRUBIOTICS PO Take 1 capsule by mouth daily.   vitamin B-12 1000 MCG tablet Commonly known as:  CYANOCOBALAMIN Take 1,000 mcg by mouth daily.      Follow-up Information    STACKS,WARREN, MD. Schedule an appointment as soon as possible for a visit in 5 day(s).   Specialty:  Family Medicine Contact information: [address] [place] [address] [phone]          Allergies  Allergen Reactions  . Nsaids Shortness Of Breath and Swelling    Consultations: none   Procedures/Studies: Dg Chest 2 View  Result Date: 11/23/2015 CLINICAL DATA:  Decreased oxygen saturation with chest pain and shortness of breath EXAM: CHEST  2 VIEW COMPARISON:  Chest radiograph and chest CT Jul 09, 2015 FINDINGS: There is mild generalized interstitial prominence without frank edema or consolidation evident. Heart size and pulmonary vascularity are normal. No adenopathy. Port-A-Cath tip is at the cavoatrial junction. No pneumothorax. No bone lesions evident. There is postoperative change in the left hemithorax, stable. IMPRESSION: Mild generalized interstitial prominence  without frank edema or consolidation evident by radiography. Stable cardiac silhouette. No pneumothorax. Electronically Signed   By: Lowella Grip III M.D.   On: 11/23/2015 17:24   Ct Angio Chest Pe W Or Wo Contrast  Result Date: 11/24/2015 CLINICAL DATA:  Shortness of breath for 1 month, worsening for 4 days. History of DVT and pulmonary embolism. EXAM: CT ANGIOGRAPHY CHEST WITH CONTRAST TECHNIQUE: Multidetector CT imaging of the chest was performed using the standard protocol during bolus administration of intravenous contrast. Multiplanar CT image reconstructions and MIPs were obtained to evaluate the vascular anatomy. CONTRAST:  100 cc Isovue 370 intravenous COMPARISON:  07/09/2015 FINDINGS: Cardiovascular: Study optimized for the pulmonary arteries. There is a new subsegmental non-opacified pulmonary artery in the superior segment lingula, representative image 6:172. This is a new compared to prior. Although a branching filling defect is not seen at the more proximal bifurcation point, this vessel is convincingly non-opacified. No other embolic disease is identified. No cardiomegaly or pericardial effusion. Negative thoracic aorta. Mediastinum: No adenopathy. Porta catheter on the right with tip at the SVC level. Lungs/Pleura: Clustered indistinct nodules in the left lower lobe. Few areas of patchy ground-glass opacity in the central right lung. Right lower lobe atelectasis. Borderline generalized cylindrical bronchiectasis. No evidence of infarct. Upper abdomen: No acute findings. Hepatic steatosis. Left nephrolithiasis. Musculoskeletal: No acute or aggressive finding. Postoperative left chest wall. Critical Value/emergent results were called by telephone at the time of interpretation on 11/24/2015 at 9:32 am to Dr. Charlies Silvers, who verbally acknowledged these results. Review of the MIP images confirms the above findings. IMPRESSION: 1. Single subsegmental embolism to the lingula that is new from 07/09/2015.  2. Mild infectious or inflammatory airspace disease in the left lower lobe. 3. Right lower lobe atelectasis. 4. Hepatic steatosis and left nephrolithiasis. Electronically Signed   By: Monte Fantasia M.D.   On: 11/24/2015 09:34   Dg Chest Port 1 View  Result Date:  11/27/2015 CLINICAL DATA:  Pneumonia. EXAM: PORTABLE CHEST 1 VIEW COMPARISON:  11/24/2015 and 11/23/2015 FINDINGS: Right jugular central line extends into the right atrium. Again noted is elevation of the right hemidiaphragm. No focal airspace disease or pulmonary edema. Surgical wires around left ribs are again noted. Negative for a pneumothorax. Heart size is within normal limits and stable. Trachea is midline. IMPRESSION: No acute findings. Central line in the right atrium. Electronically Signed   By: Markus Daft M.D.   On: 11/27/2015 07:41    Subjective: Feeling better.  Able to ambulate to the bathroom and perform ADLs without dyspnea.  On 2L.    Discharge Exam: Vitals:   11/27/15 0536 11/27/15 0922  BP: 139/89   Pulse: 73 88  Resp: 18 18  Temp: 98.1 F (36.7 C)    Vitals:   11/26/15 2207 11/27/15 0536 11/27/15 0800 11/27/15 0922  BP: 120/67 139/89    Pulse: 81 73  88  Resp: _0 Temp: 98.8 F (37.1 C) 98.1 F (36.7 C)    TempSrc: Oral Oral    SpO2: 98% 99%  98%  Weight:   95.3 kg (210 lb 1.6 oz)   Height:        General exam:  Adult Male.  Lying in bed, tired appearing.   HEENT:  NCAT, MMM Respiratory system:   improved aeration today.  Persistent rales, rhonchi, and wheeze Cardiovascular system: Regular rate and rhythm, normal S1/S2. No murmurs, rubs, gallops or clicks.  Warm extremities Gastrointestinal system: Normal active bowel sounds, soft, nondistended, nontender. MSK:  Normal tone and bulk,  Trace bilateral pitting lower extremity edema Neuro:  Grossly intact    The results of significant diagnostics from this hospitalization (including imaging, microbiology, ancillary and laboratory) are listed  below for reference.     Microbiology: Recent Results (from the past 240 hour(s))  Blood Culture (routine x 2)     Status: None (Preliminary result)   Collection Time: 11/23/15  5:12 PM  Result Value Ref Range Status   Specimen Description BLOOD LEFT ANTECUBITAL  Final   Special Requests BOTTLES DRAWN AEROBIC AND ANAEROBIC 5CC  Final   Culture   Final    NO GROWTH 3 DAYS Performed at [facility]    Report Status PENDING  Incomplete  Blood Culture (routine x 2)     Status: None (Preliminary result)   Collection Time: 11/23/15  5:55 PM  Result Value Ref Range Status   Specimen Description BLOOD RIGHT ANTECUBITAL  Final   Special Requests BOTTLES DRAWN AEROBIC AND ANAEROBIC 5ML  Final   Culture   Final    NO GROWTH 3 DAYS Performed at [facility]    Report Status PENDING  Incomplete  Urine culture     Status: Abnormal   Collection Time: 11/23/15  9:07 PM  Result Value Ref Range Status   Specimen Description URINE, RANDOM  Final   Special Requests NONE  Final   Culture 20,000 COLONIES/mL ENTEROCOCCUS FAECALIS (A)  Final   Report Status 11/26/2015 FINAL  Final   Organism ID, Bacteria ENTEROCOCCUS FAECALIS (A)  Final      Susceptibility   Enterococcus faecalis - MIC*    AMPICILLIN <=2 SENSITIVE Sensitive     LEVOFLOXACIN 1 SENSITIVE Sensitive     NITROFURANTOIN <=16 SENSITIVE Sensitive     VANCOMYCIN 1 SENSITIVE Sensitive     * 20,000 COLONIES/mL ENTEROCOCCUS FAECALIS  Culture, sputum-assessment     Status: None  Collection Time: 11/24/15  6:10 PM  Result Value Ref Range Status   Specimen Description SPUTUM  Final   Special Requests NONE  Final   Sputum evaluation THIS SPECIMEN IS ACCEPTABLE FOR SPUTUM CULTURE  Final   Report Status 11/24/2015 FINAL  Final  Culture, respiratory (NON-Expectorated)     Status: None   Collection Time: 11/24/15  6:10 PM  Result Value Ref Range Status   Specimen Description SPUTUM  Final   Special Requests NONE  Final   Gram  Stain   Final    ABUNDANT WBC PRESENT,BOTH PMN AND MONONUCLEAR RARE GRAM POSITIVE COCCI IN PAIRS RARE GRAM VARIABLE ROD FEW YEAST    Culture   Final    Consistent with normal respiratory flora. Performed at [facility]    Report Status 11/26/2015 FINAL  Final     Labs: BNP (last 3 results)  Recent Labs  04/22/15 1134 07/09/15 1605 11/24/15 0229  BNP 73.6 59.0 84.8   Basic Metabolic Panel:  Recent Labs Lab 11/23/15 1711 11/25/15 0402 11/26/15 0519 11/27/15 0451  NA 138 141 144 143  K 3.8 3.3* 3.5 3.7  CL 93* 99* 97* 94*  CO2 35* 35* 38* 39*  GLUCOSE 132* 273* 194* 202*  BUN 15 22* 26* 26*  CREATININE 1.17 1.10 0.93 0.97  CALCIUM 9.5 9.7 9.3 9.4   Liver Function Tests: No results for input(s): AST, ALT, ALKPHOS, BILITOT, PROT, ALBUMIN in the last 168 hours. No results for input(s): LIPASE, AMYLASE in the last 168 hours. No results for input(s): AMMONIA in the last 168 hours. CBC:  Recent Labs Lab 11/23/15 1711 11/25/15 0402 11/26/15 0519  WBC 11.9* 13.3* 11.8*  HGB 10.1* 8.4* 8.7*  HCT 31.4* 25.6* 26.6*  MCV 104.7* 103.6* 103.9*  PLT 109* 86* 94*   Cardiac Enzymes: No results for input(s): CKTOTAL, CKMB, CKMBINDEX, TROPONINI in the last 168 hours. BNP: Invalid input(s): POCBNP CBG:  Recent Labs Lab 11/26/15 0801 11/26/15 1202 11/26/15 1710 11/26/15 2212 11/27/15 0757  GLUCAP 182* 228* 184* 270* 169*   D-Dimer No results for input(s): DDIMER in the last 72 hours. Hgb A1c No results for input(s): HGBA1C in the last 72 hours. Lipid Profile No results for input(s): CHOL, HDL, LDLCALC, TRIG, CHOLHDL, LDLDIRECT in the last 72 hours. Thyroid function studies No results for input(s): TSH, T4TOTAL, T3FREE, THYROIDAB in the last 72 hours.  Invalid input(s): FREET3 Anemia work up No results for input(s): VITAMINB12, FOLATE, FERRITIN, TIBC, IRON, RETICCTPCT in the last 72 hours. Urinalysis    Component Value Date/Time   COLORURINE  YELLOW 11/23/2015 2107   APPEARANCEUR CLEAR 11/23/2015 2107   LABSPEC 1.012 11/23/2015 2107   PHURINE 6.0 11/23/2015 2107   GLUCOSEU NEGATIVE 11/23/2015 2107   HGBUR NEGATIVE 11/23/2015 2107   BILIRUBINUR NEGATIVE 11/23/2015 2107   KETONESUR NEGATIVE 11/23/2015 2107   PROTEINUR NEGATIVE 11/23/2015 2107   UROBILINOGEN 1.0 07/18/2009 1936   NITRITE NEGATIVE 11/23/2015 2107   LEUKOCYTESUR NEGATIVE 11/23/2015 2107   Sepsis Labs Invalid input(s): PROCALCITONIN,  WBC,  LACTICIDVEN   Time coordinating discharge: Over 30 minutes  SIGNED:   Janece Canterbury, MD  [facility] 11/27/2015, 10:35 AM Pager   If 7PM-7AM, please contact night-coverage [email or web address] Password [email or web address]

## 2015-11-27 NOTE — Progress Notes (Signed)
Spoke with pt concerning HH. Pt selected [facility] for [facility]. Referral given to in house rep.

## 2015-11-27 NOTE — Discharge Instructions (Signed)
Information on my medicine - ELIQUIS (apixaban)  This medication education was reviewed with me or my healthcare representative as part of my discharge preparation.  The pharmacist that spoke with me during my hospital stay was:  Laqueta Jean  Why was Eliquis prescribed for you? Eliquis was prescribed to treat blood clots that may have been found in the veins of your legs (deep vein thrombosis) or in your lungs (pulmonary embolism) and to reduce the risk of them occurring again.  What do You need to know about Eliquis ? The starting dose is 10 mg (two 5 mg tablets) taken TWICE daily for the FIRST SEVEN (7) DAYS, then on 12/04/15  the dose is reduced to ONE 5 mg tablet taken TWICE daily.  Eliquis may be taken with or without food.   Try to take the dose about the same time in the morning and in the evening. If you have difficulty swallowing the tablet whole please discuss with your pharmacist how to take the medication safely.  Take Eliquis exactly as prescribed and DO NOT stop taking Eliquis without talking to the doctor who prescribed the medication.  Stopping may increase your risk of developing a new blood clot.  Refill your prescription before you run out.  After discharge, you should have regular check-up appointments with your healthcare provider that is prescribing your Eliquis.    What do you do if you miss a dose? If a dose of ELIQUIS is not taken at the scheduled time, take it as soon as possible on the same day and twice-daily administration should be resumed. The dose should not be doubled to make up for a missed dose.  Important Safety Information A possible side effect of Eliquis is bleeding. You should call your healthcare provider right away if you experience any of the following: ? Bleeding from an injury or your nose that does not stop. ? Unusual colored urine (red or dark brown) or unusual colored stools (red or black). ? Unusual bruising for unknown reasons. ? A  serious fall or if you hit your head (even if there is no bleeding).  Some medicines may interact with Eliquis and might increase your risk of bleeding or clotting while on Eliquis. To help avoid this, consult your healthcare provider or pharmacist prior to using any new prescription or non-prescription medications, including herbals, vitamins, non-steroidal anti-inflammatory drugs (NSAIDs) and supplements.  This website has more information on Eliquis (apixaban): http://www.eliquis.com/eliquis/home

## 2015-11-28 LAB — CULTURE, BLOOD (ROUTINE X 2)
CULTURE: NO GROWTH
Culture: NO GROWTH

## 2015-11-28 LAB — LEGIONELLA PNEUMOPHILA SEROGP 1 UR AG: L. pneumophila Serogp 1 Ur Ag: NEGATIVE

## 2015-11-30 ENCOUNTER — Telehealth: Payer: Self-pay | Admitting: Family Medicine

## 2015-11-30 NOTE — Telephone Encounter (Signed)
Pt  Needs appt.

## 2015-12-01 ENCOUNTER — Ambulatory Visit (INDEPENDENT_AMBULATORY_CARE_PROVIDER_SITE_OTHER): Payer: Commercial Managed Care - HMO | Admitting: Family Medicine

## 2015-12-01 ENCOUNTER — Encounter: Payer: Self-pay | Admitting: Family Medicine

## 2015-12-01 ENCOUNTER — Ambulatory Visit (INDEPENDENT_AMBULATORY_CARE_PROVIDER_SITE_OTHER): Payer: Commercial Managed Care - HMO

## 2015-12-01 VITALS — BP 130/75 | HR 102 | Ht 66.0 in | Wt 212.5 lb

## 2015-12-01 DIAGNOSIS — R059 Cough, unspecified: Secondary | ICD-10-CM

## 2015-12-01 DIAGNOSIS — J441 Chronic obstructive pulmonary disease with (acute) exacerbation: Secondary | ICD-10-CM

## 2015-12-01 DIAGNOSIS — J431 Panlobular emphysema: Secondary | ICD-10-CM

## 2015-12-01 DIAGNOSIS — E1165 Type 2 diabetes mellitus with hyperglycemia: Secondary | ICD-10-CM | POA: Diagnosis not present

## 2015-12-01 DIAGNOSIS — I2699 Other pulmonary embolism without acute cor pulmonale: Secondary | ICD-10-CM

## 2015-12-01 DIAGNOSIS — R05 Cough: Secondary | ICD-10-CM | POA: Diagnosis not present

## 2015-12-01 MED ORDER — HYDROCODONE-HOMATROPINE 5-1.5 MG/5ML PO SYRP: 5.0000 mL | ORAL_SOLUTION | Freq: Four times a day (QID) | ORAL | 0 refills | Status: DC | PRN

## 2015-12-01 MED ORDER — DOXYCYCLINE HYCLATE 100 MG PO CAPS: 100.0000 mg | ORAL_CAPSULE | Freq: Two times a day (BID) | ORAL | 0 refills | Status: DC

## 2015-12-01 MED ORDER — PREDNISONE 20 MG PO TABS: ORAL_TABLET | ORAL | 0 refills | Status: DC

## 2015-12-01 NOTE — Telephone Encounter (Signed)
Appointment made with Dr. Livia Snellen for Cough today 12/01/15 and will come back for hospital follow up on Friday 12/04/15

## 2015-12-01 NOTE — Progress Notes (Signed)
Subjective:  Patient ID: Derek Shepherd, male    DOB: [birth year]  Age: 60 y.o. MRN: [id number]  CC: Cough (pt here today)   HPI GERVASE COLBERG presents for Persistent cough. He was hospitalized several days ago for acute on chronic respiratory failure with hypoxia due to a COPD exacerbation and sepsis due to pneumonia. Also noted to have an acute pulmonary embolism. Currently he is taking doxycycline this is to be continued through September 28 or 29th. and was started on Eliquis. He is still having a significant cough. He is bringing up some white to yellow sputum with the cough. His shortness of breath is still present but closing in on baseline. He continues to use oxygen 3 L nasal cannula.   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   He has a past surgical history that includes Cholecystectomy; Lung surgery (Left); Exploratory laparotomy; Hernia repair; Colonoscopy w/ biopsies; and Bone Shepherd transplant (2011).   His family history includes Clotting disorder in his mother; Diabetes in his brother, father, maternal aunt, and sister; Heart attack in his father and mother; Prostate cancer in his brother.He reports that he quit smoking about 22 years ago. His smoking use included Cigarettes. He has never used smokeless tobacco. He reports that he drinks alcohol. He reports that he does not use drugs.    ROS Review of Systems  Constitutional: Positive for fatigue.  Negative for chills, diaphoresis, fever and unexpected weight change.  HENT: Negative for congestion, hearing loss, rhinorrhea and sore throat.   Eyes: Negative for visual disturbance.  Respiratory: Positive for cough, shortness of breath and wheezing.   Cardiovascular: Negative for chest pain and leg swelling.  Gastrointestinal: Negative for abdominal pain, constipation and diarrhea.  Genitourinary: Negative for dysuria and flank pain.  Musculoskeletal: Negative for arthralgias and joint swelling.  Skin: Negative for rash.  Neurological: Negative for dizziness and headaches.  Psychiatric/Behavioral: Negative for dysphoric mood and sleep disturbance.    Objective:  BP 130/75   Pulse (!) 102   Ht 5' 6"  (1.676 m)   Wt 212 lb 8 oz (96.4 kg)   SpO2 90% Comment: on 3 liters of 02 at home, portable tank in office on 5L  BMI 34.30 kg/m   BP Readings from Last 3 Encounters:  12/01/15 130/75  11/27/15 139/89  10/21/15 122/75    Wt Readings from Last 3 Encounters:  12/01/15 212 lb 8 oz (96.4 kg)  11/27/15 210 lb 1.6 oz (95.3 kg)  10/21/15 213 lb 3.2 oz (96.7 kg)     Physical Exam  Constitutional: He is oriented to person, place, and time. He appears well-developed and well-nourished. He appears distressed (debilitated).  HENT:  Head: Normocephalic and atraumatic.  Right Ear: External ear normal.  Left Ear: External ear normal.  Nose: Nose normal.  Mouth/Throat: Oropharynx is clear and moist.  Eyes: Conjunctivae and EOM are normal. Pupils are equal, round, and reactive to light.  Neck: Normal range of motion. Neck supple. No thyromegaly present.  Cardiovascular: Normal rate, regular rhythm and normal heart sounds.   No murmur heard. Pulmonary/Chest: Effort normal. No respiratory distress. He  has wheezes. He has rales.  Abdominal: Soft. Bowel sounds are normal. He exhibits no distension. There is no tenderness.  Lymphadenopathy:    He has no cervical adenopathy.  Neurological:  He is alert and oriented to person, place, and time. He has normal reflexes.  Skin: Skin is warm and dry.  Psychiatric: He has a normal mood and affect. His behavior is normal. Judgment and thought content normal.     Lab Results  Component Value Date   WBC 11.8 (H) 11/26/2015   HGB 8.7 (L) 11/26/2015   HCT 26.6 (L) 11/26/2015   PLT 94 (L) 11/26/2015   GLUCOSE 202 (H) 11/27/2015   CHOL 214 (H) 12/01/2014   TRIG 290 (H) 12/01/2014   HDL 48 12/01/2014   LDLCALC 108 (H) 12/01/2014   ALT 30 10/21/2015   AST 28 10/21/2015   NA 143 11/27/2015   K 3.7 11/27/2015   CL 94 (L) 11/27/2015   CREATININE 0.97 11/27/2015   BUN 26 (H) 11/27/2015   CO2 39 (H) 11/27/2015   PSA 1.0 02/15/2013   INR 1.00 11/24/2015   HGBA1C 5.6 12/27/2014   MICROALBUR negq 10/31/2013    Dg Chest 2 View  Result Date: 11/23/2015 CLINICAL DATA:  Decreased oxygen saturation with chest pain and shortness of breath EXAM: CHEST  2 VIEW COMPARISON:  Chest radiograph and chest CT Jul 09, 2015 FINDINGS: There is mild generalized interstitial prominence without frank edema or consolidation evident. Heart size and pulmonary vascularity are normal. No adenopathy. Port-A-Cath tip is at the cavoatrial junction. No pneumothorax. No bone lesions evident. There is postoperative change in the left hemithorax, stable. IMPRESSION: Mild generalized interstitial prominence without frank edema or consolidation evident by radiography. Stable cardiac silhouette. No pneumothorax. Electronically Signed   By: Lowella Grip III M.D.   On: 11/23/2015 17:24   Ct Angio Chest Pe W Or Wo Contrast  Result Date: 11/24/2015 CLINICAL DATA:  Shortness of breath for 1 month, worsening for 4 days. History of DVT and pulmonary embolism. EXAM: CT ANGIOGRAPHY CHEST WITH CONTRAST TECHNIQUE: Multidetector CT imaging of the chest was performed using the standard protocol during bolus administration of intravenous contrast. Multiplanar CT image reconstructions  and MIPs were obtained to evaluate the vascular anatomy. CONTRAST:  100 cc Isovue 370 intravenous COMPARISON:  07/09/2015 FINDINGS: Cardiovascular: Study optimized for the pulmonary arteries. There is a new subsegmental non-opacified pulmonary artery in the superior segment lingula, representative image 6:172. This is a new compared to prior. Although a branching filling defect is not seen at the more proximal bifurcation point, this vessel is convincingly non-opacified. No other embolic disease is identified. No cardiomegaly or pericardial effusion. Negative thoracic aorta. Mediastinum: No adenopathy. Porta catheter on the right with tip at the SVC level. Lungs/Pleura: Clustered indistinct nodules in the left lower lobe. Few areas of patchy ground-glass opacity in the central right lung. Right lower lobe atelectasis. Borderline generalized cylindrical bronchiectasis. No evidence of infarct. Upper abdomen: No acute findings. Hepatic steatosis. Left nephrolithiasis. Musculoskeletal: No acute or aggressive finding. Postoperative left chest wall. Critical Value/emergent results were called by telephone at the time of interpretation on 11/24/2015 at 9:32 am to Dr. Charlies Silvers, who verbally acknowledged these results. Review of the MIP images confirms the above findings. IMPRESSION: 1. Single subsegmental embolism to the lingula that is new from 07/09/2015. 2. Mild infectious or inflammatory airspace disease in the left lower lobe. 3. Right lower lobe atelectasis. 4. Hepatic steatosis and left nephrolithiasis. Electronically Signed   By:  Monte Fantasia M.D.   On: 11/24/2015 09:34    Assessment & Plan:   Mikaele was seen today for cough.  Diagnoses and all orders for this visit:  COPD exacerbation ([place]) -     DG Chest 2 View; Future -     Urinalysis, Complete -     Urine culture -     CBC with Differential/Platelet -     CMP14+EGFR  Type 2 diabetes mellitus with hyperglycemia, without long-term current use of  insulin (HCC) -     Urinalysis, Complete -     Urine culture -     CBC with Differential/Platelet -     CMP14+EGFR  Panlobular emphysema (HCC) -     DG Chest 2 View; Future -     Urinalysis, Complete -     Urine culture -     CBC with Differential/Platelet -     CMP14+EGFR  Cough -     DG Chest 2 View; Future -     Urinalysis, Complete -     Urine culture -     CBC with Differential/Platelet -     CMP14+EGFR  Other acute pulmonary embolism without acute cor pulmonale (HCC) -     DG Chest 2 View; Future -     Urinalysis, Complete -     Urine culture -     CBC with Differential/Platelet -     CMP14+EGFR  Other orders -     predniSONE (DELTASONE) 20 MG tablet; 1 twice daily for one week then one daily for one week then discontinue -     doxycycline (VIBRAMYCIN) 100 MG capsule; Take 1 capsule (100 mg total) by mouth 2 (two) times daily. -     HYDROcodone-homatropine (HYCODAN) 5-1.5 MG/5ML syrup; Take 5 mLs by mouth every 6 (six) hours as needed for cough.      I have discontinued Mr. Chalfant's doxycycline and predniSONE. I am also having him start on predniSONE, doxycycline, and HYDROcodone-homatropine. Additionally, I am having him maintain his Multiple Vitamins, Polyethyl Glycol-Propyl Glycol, folic acid, vitamin L-57, albuterol, Magnesium Chloride-Calcium, OXYGEN, polyethylene glycol powder, Sirolimus, montelukast, Probiotic Product (TRUBIOTICS PO), lactulose, levalbuterol, Ankle Lace-Up Brace, potassium chloride, rOPINIRole, torsemide, aMILoride, ONE TOUCH ULTRA TEST, metFORMIN, SYMBICORT, SPIRIVA HANDIHALER, escitalopram, nefazodone, HYDROmorphone, fluticasone, pantoprazole, apixaban, and glucose blood.  Meds ordered this encounter  Medications  . predniSONE (DELTASONE) 20 MG tablet    Sig: 1 twice daily for one week then one daily for one week then discontinue    Dispense:  21 tablet    Refill:  0  . doxycycline (VIBRAMYCIN) 100 MG capsule    Sig: Take 1 capsule (100 mg  total) by mouth 2 (two) times daily.    Dispense:  14 capsule    Refill:  0  . HYDROcodone-homatropine (HYCODAN) 5-1.5 MG/5ML syrup    Sig: Take 5 mLs by mouth every 6 (six) hours as needed for cough.    Dispense:  120 mL    Refill:  0     Follow-up: Return in about 2 weeks (around 12/15/2015).  Claretta Fraise, M.D.

## 2015-12-04 ENCOUNTER — Ambulatory Visit: Payer: Medicare HMO | Admitting: Family Medicine

## 2015-12-08 ENCOUNTER — Telehealth: Payer: Self-pay | Admitting: *Deleted

## 2015-12-08 NOTE — Telephone Encounter (Signed)
Medication called in to [facility], patient informed and appointment made for followup on 01/06/16.  Patient advised to let us know if BP and BS continue to remain elevated before then.

## 2015-12-08 NOTE — Telephone Encounter (Signed)
Add amlodipine  5 mg once a day.#30

## 2015-12-14 ENCOUNTER — Other Ambulatory Visit: Payer: Self-pay | Admitting: Family Medicine

## 2015-12-14 ENCOUNTER — Ambulatory Visit (INDEPENDENT_AMBULATORY_CARE_PROVIDER_SITE_OTHER): Payer: Medicare HMO

## 2015-12-14 ENCOUNTER — Encounter: Payer: Self-pay | Admitting: Family Medicine

## 2015-12-14 ENCOUNTER — Ambulatory Visit (INDEPENDENT_AMBULATORY_CARE_PROVIDER_SITE_OTHER): Payer: Medicare HMO | Admitting: Family Medicine

## 2015-12-14 VITALS — BP 116/75 | HR 121 | Temp 98.1°F | Ht 66.0 in | Wt 199.1 lb

## 2015-12-14 DIAGNOSIS — J441 Chronic obstructive pulmonary disease with (acute) exacerbation: Secondary | ICD-10-CM

## 2015-12-14 DIAGNOSIS — E119 Type 2 diabetes mellitus without complications: Secondary | ICD-10-CM | POA: Diagnosis not present

## 2015-12-14 DIAGNOSIS — D638 Anemia in other chronic diseases classified elsewhere: Secondary | ICD-10-CM

## 2015-12-14 DIAGNOSIS — I2699 Other pulmonary embolism without acute cor pulmonale: Secondary | ICD-10-CM | POA: Diagnosis not present

## 2015-12-14 DIAGNOSIS — I5042 Chronic combined systolic (congestive) and diastolic (congestive) heart failure: Secondary | ICD-10-CM | POA: Diagnosis not present

## 2015-12-14 NOTE — Progress Notes (Signed)
Subjective:  Patient ID: Derek Shepherd, male    DOB: [birth year]  Age: 60 y.o. MRN: [id number]  CC: Hospitalization Follow-up (pt here today following up after being admitted in the hospital for pneumonia and also on starting new medications. Pt feels "loopy" and is very dizzy.)   HPI Derek Shepherd presents for Follow-up from hospitalization for pneumonia. He continues to have some coughing. He feels weak and shaky and lightheaded. He has not been febrile. Shortness of breath is somewhat worse than baseline but better than before hospitalization. cough has been productive. 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   He has a past surgical history that includes Cholecystectomy; Lung surgery (Left); Exploratory laparotomy; Hernia repair; Colonoscopy w/ biopsies; and Bone marrow transplant (2011).   His family history includes Clotting disorder in his mother; Diabetes in his brother, father, maternal aunt, and sister; Heart attack in his father and mother; Prostate cancer in his brother.He reports that he quit smoking about 22 years ago. His smoking use included Cigarettes. He has never used smokeless tobacco. He reports that he drinks alcohol. He reports that he does not use drugs.    ROS Review of Systems  Constitutional: Negative for chills, diaphoresis and fever.  HENT: Negative for rhinorrhea and sore throat.   Respiratory: Negative for cough and shortness of breath.     Cardiovascular: Negative for chest pain.  Gastrointestinal: Negative for abdominal pain.  Musculoskeletal: Negative for arthralgias and myalgias.  Skin: Negative for rash.  Neurological: Negative for weakness and headaches.    Objective:  BP 116/75   Pulse (!) 121   Temp 98.1 F (36.7 C) (Oral)   Ht 5' 6"  (1.676 m)   Wt 199 lb 2 oz (90.3 kg)   SpO2 97%   BMI 32.14 kg/m   BP Readings from Last 3 Encounters:  12/18/15 126/87  12/14/15 116/75  12/01/15 130/75    Wt Readings from Last 3 Encounters:  12/18/15 204 lb 8 oz (92.8 kg)  12/14/15 199 lb 2 oz (90.3 kg)  12/01/15 212 lb 8 oz (96.4 kg)     Physical Exam  Constitutional: He is oriented to person, place, and time. He appears well-developed and well-nourished. He appears distressed.  HENT:  Head: Normocephalic and atraumatic.  Right Ear: External ear normal.  Left Ear: External ear normal.  Nose: Nose normal.  Mouth/Throat: Oropharynx is clear and moist.  Eyes: Conjunctivae and EOM are normal. Pupils are equal, round, and reactive to light.  Neck: Normal range of motion. Neck supple. No thyromegaly present.  Cardiovascular: Normal rate, regular rhythm and normal heart sounds.   No murmur heard. Pulmonary/Chest: Effort normal. No respiratory distress. He has wheezes (breath sounds diminished). He has no rales. He exhibits no tenderness.  Abdominal: Soft. Bowel sounds are normal. He exhibits no distension. There is no tenderness.  Musculoskeletal: Normal range of motion.  Lymphadenopathy:    He has no cervical adenopathy.  Neurological: He is alert and oriented to person, place, and time. He has normal reflexes.  Skin: Skin is warm and dry.  Psychiatric: He has a normal mood and affect. His behavior  is normal. Judgment and thought content normal.     Lab Results  Component Value Date   WBC 8.9 12/14/2015   HGB 8.7 (L) 11/26/2015   HCT 37.0 (L) 12/14/2015   PLT 101 (L) 12/14/2015   GLUCOSE 184 (H) 12/14/2015    CHOL 214 (H) 12/01/2014   TRIG 290 (H) 12/01/2014   HDL 48 12/01/2014   LDLCALC 108 (H) 12/01/2014   ALT 39 12/14/2015   AST 28 12/14/2015   NA 136 12/14/2015   K 3.5 12/14/2015   CL 83 (L) 12/14/2015   CREATININE 1.02 12/14/2015   BUN 20 12/14/2015   CO2 35 (H) 12/14/2015   PSA 1.0 02/15/2013   INR 1.00 11/24/2015   HGBA1C 5.6 12/27/2014   MICROALBUR negq 10/31/2013    Dg Chest 2 View  Result Date: 11/23/2015 CLINICAL DATA:  Decreased oxygen saturation with chest pain and shortness of breath EXAM: CHEST  2 VIEW COMPARISON:  Chest radiograph and chest CT Jul 09, 2015 FINDINGS: There is mild generalized interstitial prominence without frank edema or consolidation evident. Heart size and pulmonary vascularity are normal. No adenopathy. Port-A-Cath tip is at the cavoatrial junction. No pneumothorax. No bone lesions evident. There is postoperative change in the left hemithorax, stable. IMPRESSION: Mild generalized interstitial prominence without frank edema or consolidation evident by radiography. Stable cardiac silhouette. No pneumothorax. Electronically Signed   By: Lowella Grip III M.D.   On: 11/23/2015 17:24   Ct Angio Chest Pe W Or Wo Contrast  Result Date: 11/24/2015 CLINICAL DATA:  Shortness of breath for 1 month, worsening for 4 days. History of DVT and pulmonary embolism. EXAM: CT ANGIOGRAPHY CHEST WITH CONTRAST TECHNIQUE: Multidetector CT imaging of the chest was performed using the standard protocol during bolus administration of intravenous contrast. Multiplanar CT image reconstructions and MIPs were obtained to evaluate the vascular anatomy. CONTRAST:  100 cc Isovue 370 intravenous COMPARISON:  07/09/2015 FINDINGS: Cardiovascular: Study optimized for the pulmonary arteries. There is a new subsegmental non-opacified pulmonary artery in the superior segment lingula, representative image 6:172. This is a new compared to prior. Although a branching filling defect is not seen at  the more proximal bifurcation point, this vessel is convincingly non-opacified. No other embolic disease is identified. No cardiomegaly or pericardial effusion. Negative thoracic aorta. Mediastinum: No adenopathy. Porta catheter on the right with tip at the SVC level. Lungs/Pleura: Clustered indistinct nodules in the left lower lobe. Few areas of patchy ground-glass opacity in the central right lung. Right lower lobe atelectasis. Borderline generalized cylindrical bronchiectasis. No evidence of infarct. Upper abdomen: No acute findings. Hepatic steatosis. Left nephrolithiasis. Musculoskeletal: No acute or aggressive finding. Postoperative left chest wall. Critical Value/emergent results were called by telephone at the time of interpretation on 11/24/2015 at 9:32 am to Dr. Charlies Silvers, who verbally acknowledged these results. Review of the MIP images confirms the above findings. IMPRESSION: 1. Single subsegmental embolism to the lingula that is new from 07/09/2015. 2. Mild infectious or inflammatory airspace disease in the left lower lobe. 3. Right lower lobe atelectasis. 4. Hepatic steatosis and left nephrolithiasis. Electronically Signed   By: Monte Fantasia M.D.   On: 11/24/2015 09:34    Assessment & Plan:   Derek Shepherd was seen today for hospitalization follow-up.  Diagnoses and all orders for this visit:  Other acute pulmonary embolism without acute cor pulmonale (HCC) -     CBC with Differential/Platelet -     CMP14+EGFR -     DG Chest 2 View; Future  Anemia of chronic disease -     CBC with Differential/Platelet -     CMP14+EGFR  Chronic combined systolic and diastolic CHF (congestive heart failure) (HCC) -     Brain natriuretic peptide -     CBC with Differential/Platelet -     CMP14+EGFR -     DG Chest 2 View; Future  Controlled type 2 diabetes mellitus without complication, without long-term current use of insulin (HCC) -     CBC with Differential/Platelet -     CMP14+EGFR  COPD  exacerbation (HCC) -     CBC with Differential/Platelet -     CMP14+EGFR -     DG Chest 2 View; Future     I am having Derek Shepherd maintain his Multiple Vitamins, Polyethyl Glycol-Propyl Glycol, folic acid, vitamin O-53, albuterol, Magnesium Chloride-Calcium, OXYGEN, polyethylene glycol powder, Sirolimus, montelukast, Probiotic Product (TRUBIOTICS PO), lactulose, levalbuterol, Ankle Lace-Up Brace, rOPINIRole, torsemide, aMILoride, ONE TOUCH ULTRA TEST, metFORMIN, SYMBICORT, SPIRIVA HANDIHALER, escitalopram, nefazodone, HYDROmorphone, fluticasone, pantoprazole, apixaban, glucose blood, and predniSONE.  No orders of the defined types were placed in this encounter.    Follow-up: Return in about 4 days (around 12/18/2015).  Claretta Fraise, M.D.

## 2015-12-15 DIAGNOSIS — Z7982 Long term (current) use of aspirin: Secondary | ICD-10-CM | POA: Diagnosis not present

## 2015-12-15 DIAGNOSIS — G4700 Insomnia, unspecified: Secondary | ICD-10-CM | POA: Diagnosis not present

## 2015-12-15 DIAGNOSIS — Z9484 Stem cells transplant status: Secondary | ICD-10-CM | POA: Diagnosis not present

## 2015-12-15 DIAGNOSIS — J449 Chronic obstructive pulmonary disease, unspecified: Secondary | ICD-10-CM | POA: Diagnosis not present

## 2015-12-15 DIAGNOSIS — C9101 Acute lymphoblastic leukemia, in remission: Secondary | ICD-10-CM | POA: Diagnosis not present

## 2015-12-15 DIAGNOSIS — Z86711 Personal history of pulmonary embolism: Secondary | ICD-10-CM | POA: Diagnosis not present

## 2015-12-15 DIAGNOSIS — Z7901 Long term (current) use of anticoagulants: Secondary | ICD-10-CM | POA: Diagnosis not present

## 2015-12-15 DIAGNOSIS — J440 Chronic obstructive pulmonary disease with acute lower respiratory infection: Secondary | ICD-10-CM | POA: Diagnosis not present

## 2015-12-15 DIAGNOSIS — K5900 Constipation, unspecified: Secondary | ICD-10-CM | POA: Diagnosis not present

## 2015-12-15 DIAGNOSIS — E876 Hypokalemia: Secondary | ICD-10-CM | POA: Diagnosis not present

## 2015-12-15 DIAGNOSIS — D89813 Graft-versus-host disease, unspecified: Secondary | ICD-10-CM | POA: Diagnosis not present

## 2015-12-15 DIAGNOSIS — E2740 Unspecified adrenocortical insufficiency: Secondary | ICD-10-CM | POA: Diagnosis not present

## 2015-12-15 LAB — CMP14+EGFR
ALBUMIN: 4.6 g/dL (ref 3.5–5.5)
ALT: 39 IU/L (ref 0–44)
AST: 28 IU/L (ref 0–40)
Albumin/Globulin Ratio: 2.2 (ref 1.2–2.2)
Alkaline Phosphatase: 64 IU/L (ref 39–117)
BUN / CREAT RATIO: 20 (ref 9–20)
BUN: 20 mg/dL (ref 6–24)
Bilirubin Total: 0.3 mg/dL (ref 0.0–1.2)
CALCIUM: 10.2 mg/dL (ref 8.7–10.2)
CO2: 35 mmol/L — AB (ref 18–29)
CREATININE: 1.02 mg/dL (ref 0.76–1.27)
Chloride: 83 mmol/L — ABNORMAL LOW (ref 96–106)
GFR calc non Af Amer: 81 mL/min/{1.73_m2} (ref >59)
GFR, EST AFRICAN AMERICAN: 93 mL/min/{1.73_m2} (ref >59)
GLUCOSE: 184 mg/dL — AB (ref 65–99)
Globulin, Total: 2.1 g/dL (ref 1.5–4.5)
Potassium: 3.5 mmol/L (ref 3.5–5.2)
Sodium: 136 mmol/L (ref 134–144)
TOTAL PROTEIN: 6.7 g/dL (ref 6.0–8.5)

## 2015-12-15 LAB — CBC WITH DIFFERENTIAL/PLATELET
BASOS ABS: 0 10*3/uL (ref 0.0–0.2)
Basos: 0 %
EOS (ABSOLUTE): 0.1 10*3/uL (ref 0.0–0.4)
Eos: 1 %
HEMOGLOBIN: 11.8 g/dL — AB (ref 12.6–17.7)
Hematocrit: 37 % — ABNORMAL LOW (ref 37.5–51.0)
IMMATURE GRANS (ABS): 0 10*3/uL (ref 0.0–0.1)
Immature Granulocytes: 1 %
LYMPHS: 23 %
Lymphocytes Absolute: 2 10*3/uL (ref 0.7–3.1)
MCH: 33.7 pg — ABNORMAL HIGH (ref 26.6–33.0)
MCHC: 31.9 g/dL (ref 31.5–35.7)
MCV: 106 fL — ABNORMAL HIGH (ref 79–97)
MONOCYTES: 4 %
Monocytes Absolute: 0.4 10*3/uL (ref 0.1–0.9)
Neutrophils Absolute: 6.4 10*3/uL (ref 1.4–7.0)
Neutrophils: 71 %
Platelets: 101 10*3/uL — ABNORMAL LOW (ref 150–379)
RBC: 3.5 x10E6/uL — AB (ref 4.14–5.80)
RDW: 18.9 % — ABNORMAL HIGH (ref 12.3–15.4)
WBC: 8.9 10*3/uL (ref 3.4–10.8)

## 2015-12-15 LAB — BRAIN NATRIURETIC PEPTIDE: BNP: 104.5 pg/mL — AB (ref 0.0–100.0)

## 2015-12-16 DIAGNOSIS — G8929 Other chronic pain: Secondary | ICD-10-CM | POA: Diagnosis not present

## 2015-12-16 DIAGNOSIS — G894 Chronic pain syndrome: Secondary | ICD-10-CM | POA: Diagnosis not present

## 2015-12-16 DIAGNOSIS — R000 Tachycardia, unspecified: Secondary | ICD-10-CM | POA: Diagnosis not present

## 2015-12-16 DIAGNOSIS — M79605 Pain in left leg: Secondary | ICD-10-CM | POA: Diagnosis not present

## 2015-12-18 ENCOUNTER — Encounter: Payer: Self-pay | Admitting: Family Medicine

## 2015-12-18 ENCOUNTER — Ambulatory Visit (INDEPENDENT_AMBULATORY_CARE_PROVIDER_SITE_OTHER): Payer: Medicare HMO | Admitting: Family Medicine

## 2015-12-18 VITALS — BP 126/87 | HR 113 | Ht 66.0 in | Wt 204.5 lb

## 2015-12-18 DIAGNOSIS — R002 Palpitations: Secondary | ICD-10-CM | POA: Diagnosis not present

## 2015-12-18 DIAGNOSIS — R000 Tachycardia, unspecified: Secondary | ICD-10-CM

## 2015-12-18 DIAGNOSIS — I2699 Other pulmonary embolism without acute cor pulmonale: Secondary | ICD-10-CM | POA: Diagnosis not present

## 2015-12-18 DIAGNOSIS — J431 Panlobular emphysema: Secondary | ICD-10-CM | POA: Diagnosis not present

## 2015-12-18 DIAGNOSIS — E119 Type 2 diabetes mellitus without complications: Secondary | ICD-10-CM | POA: Diagnosis not present

## 2015-12-22 ENCOUNTER — Encounter: Payer: Self-pay | Admitting: Family Medicine

## 2015-12-22 ENCOUNTER — Ambulatory Visit (INDEPENDENT_AMBULATORY_CARE_PROVIDER_SITE_OTHER): Payer: Medicare HMO | Admitting: Family Medicine

## 2015-12-22 VITALS — BP 137/78 | HR 102 | Temp 98.7°F | Ht 66.0 in | Wt 206.0 lb

## 2015-12-22 DIAGNOSIS — E119 Type 2 diabetes mellitus without complications: Secondary | ICD-10-CM | POA: Diagnosis not present

## 2015-12-22 DIAGNOSIS — Z23 Encounter for immunization: Secondary | ICD-10-CM

## 2015-12-22 DIAGNOSIS — C9101 Acute lymphoblastic leukemia, in remission: Secondary | ICD-10-CM

## 2015-12-22 DIAGNOSIS — D638 Anemia in other chronic diseases classified elsewhere: Secondary | ICD-10-CM

## 2015-12-22 DIAGNOSIS — D696 Thrombocytopenia, unspecified: Secondary | ICD-10-CM | POA: Diagnosis not present

## 2015-12-22 DIAGNOSIS — I2699 Other pulmonary embolism without acute cor pulmonale: Secondary | ICD-10-CM

## 2015-12-22 DIAGNOSIS — Z125 Encounter for screening for malignant neoplasm of prostate: Secondary | ICD-10-CM | POA: Diagnosis not present

## 2015-12-22 DIAGNOSIS — J431 Panlobular emphysema: Secondary | ICD-10-CM | POA: Diagnosis not present

## 2015-12-22 DIAGNOSIS — Z0000 Encounter for general adult medical examination without abnormal findings: Secondary | ICD-10-CM | POA: Diagnosis not present

## 2015-12-22 LAB — BAYER DCA HB A1C WAIVED: HB A1C (BAYER DCA - WAIVED): 8 % — ABNORMAL HIGH (ref <7.0)

## 2015-12-22 MED ORDER — APIXABAN 5 MG PO TABS: 5.0000 mg | ORAL_TABLET | Freq: Two times a day (BID) | ORAL | 5 refills | Status: DC

## 2015-12-22 NOTE — Progress Notes (Signed)
Subjective:  Patient ID: Derek Shepherd, male    DOB: December 29, 1955  Age: 60 y.o. MRN: [id number]  CC: Tachycardia (pt here today following up on Tachycardia and breathing. Pt saw pain doctor  2 days ago and he did an EKG due to tachycardia in office and EKG was normal. Pt also had a new O2 system put in at his house because his old one was only putting out 70% of the oxygen that was supposed to be given. Pt states he is feeling much better now.)   HPI Derek Shepherd presents for Patient started on oxygen noted above with much better output now he feels a lot better. He still having pain. He is less short of breath. He continues to have tachycardia. He has some palpitations associated with that no chest pain. His shortness of breath is at baseline currently. Actually perhaps even better than baseline. His cough has resolved. He is still weak from his recent hospitalization. Records from that hospitalization  were reviewed as part of this evaluation.   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   He has a past surgical history that includes Cholecystectomy; Lung surgery (Left); Exploratory laparotomy; Hernia repair; Colonoscopy w/ biopsies; and Bone marrow transplant (2011).   His family history includes Clotting disorder in his mother; Diabetes in his brother, father, maternal aunt, and sister; Heart attack in his father and mother; Prostate cancer in his brother.He reports that he  quit smoking about 22 years ago. His smoking use included Cigarettes. He has never used smokeless tobacco. He reports that he drinks alcohol. He reports that he does not use drugs.    ROS Review of Systems  Constitutional: Negative for chills, diaphoresis, fever and unexpected weight change.  HENT: Negative for congestion, hearing loss, rhinorrhea and sore throat.   Eyes: Negative for visual disturbance.  Respiratory: Positive for cough and shortness of breath.   Cardiovascular: Positive for palpitations. Negative for chest pain.  Gastrointestinal: Negative for abdominal pain, constipation and diarrhea.  Genitourinary: Negative for dysuria and flank pain.  Musculoskeletal: Positive for arthralgias. Negative for joint swelling.  Skin: Negative for rash.  Neurological: Negative for dizziness and headaches.  Psychiatric/Behavioral: Negative for dysphoric mood and sleep disturbance.    Objective:  BP 126/87   Pulse (!) 113   Ht 5\' 6"  (1.676 m)   Wt 204 lb 8 oz (92.8 kg)   SpO2 98%   BMI 33.01 kg/m   BP Readings from Last 3 Encounters:  12/22/15 137/78  12/18/15 126/87  12/14/15 116/75    Wt Readings from Last 3 Encounters:  12/22/15 206 lb (93.4 kg)  12/18/15 204 lb 8 oz (92.8 kg)  12/14/15 199 lb 2 oz (90.3 kg)     Physical Exam  Constitutional: He is oriented to person, place, and time. He appears well-developed and well-nourished. No distress.  HENT:  Head: Normocephalic and atraumatic.  Right Ear: External ear normal.  Left Ear: External ear normal.  Nose: Nose normal.  Mouth/Throat: Oropharynx is clear and moist.  Eyes: Conjunctivae and EOM are normal. Pupils are equal, round, and reactive to light.  Neck: Normal  range of motion. Neck supple. No thyromegaly present.  Cardiovascular: Normal rate, regular rhythm and normal heart sounds.   No murmur heard. Pulmonary/Chest: Effort normal and breath sounds normal. No respiratory distress. He has no wheezes. He has no  rales.  Abdominal: Soft. Bowel sounds are normal. He exhibits no distension. There is no tenderness.  Lymphadenopathy:    He has no cervical adenopathy.  Neurological: He is alert and oriented to person, place, and time. He has normal reflexes.  Skin: Skin is warm and dry.  Psychiatric: He has a normal mood and affect. His behavior is normal. Judgment and thought content normal.     Lab Results  Component Value Date   WBC 8.9 12/14/2015   HGB 8.7 (L) 11/26/2015   HCT 37.0 (L) 12/14/2015   PLT 101 (L) 12/14/2015   GLUCOSE 184 (H) 12/14/2015   CHOL 214 (H) 12/01/2014   TRIG 290 (H) 12/01/2014   HDL 48 12/01/2014   LDLCALC 108 (H) 12/01/2014   ALT 39 12/14/2015   AST 28 12/14/2015   NA 136 12/14/2015   K 3.5 12/14/2015   CL 83 (L) 12/14/2015   CREATININE 1.02 12/14/2015   BUN 20 12/14/2015   CO2 35 (H) 12/14/2015   PSA 1.0 02/15/2013   INR 1.00 11/24/2015   HGBA1C 5.6 12/27/2014   MICROALBUR negq 10/31/2013    Dg Chest 2 View  Result Date: 11/23/2015 CLINICAL DATA:  Decreased oxygen saturation with chest pain and shortness of breath EXAM: CHEST  2 VIEW COMPARISON:  Chest radiograph and chest CT Jul 09, 2015 FINDINGS: There is mild generalized interstitial prominence without frank edema or consolidation evident. Heart size and pulmonary vascularity are normal. No adenopathy. Port-A-Cath tip is at the cavoatrial junction. No pneumothorax. No bone lesions evident. There is postoperative change in the left hemithorax, stable. IMPRESSION: Mild generalized interstitial prominence without frank edema or consolidation evident by radiography. Stable cardiac silhouette. No pneumothorax. Electronically Signed   By: Lowella Grip III M.D.   On: 11/23/2015 17:24   Ct Angio Chest Pe W Or Wo Contrast  Result Date: 11/24/2015 CLINICAL DATA:  Shortness of breath for 1 month, worsening for 4 days. History of DVT and pulmonary embolism. EXAM: CT ANGIOGRAPHY CHEST WITH CONTRAST TECHNIQUE:  Multidetector CT imaging of the chest was performed using the standard protocol during bolus administration of intravenous contrast. Multiplanar CT image reconstructions and MIPs were obtained to evaluate the vascular anatomy. CONTRAST:  100 cc Isovue 370 intravenous COMPARISON:  07/09/2015 FINDINGS: Cardiovascular: Study optimized for the pulmonary arteries. There is a new subsegmental non-opacified pulmonary artery in the superior segment lingula, representative image 6:172. This is a new compared to prior. Although a branching filling defect is not seen at the more proximal bifurcation point, this vessel is convincingly non-opacified. No other embolic disease is identified. No cardiomegaly or pericardial effusion. Negative thoracic aorta. Mediastinum: No adenopathy. Porta catheter on the right with tip at the SVC level. Lungs/Pleura: Clustered indistinct nodules in the left lower lobe. Few areas of patchy ground-glass opacity in the central right lung. Right lower lobe atelectasis. Borderline generalized cylindrical bronchiectasis. No evidence of infarct. Upper abdomen: No acute findings. Hepatic steatosis. Left nephrolithiasis. Musculoskeletal: No acute or aggressive finding. Postoperative left chest wall. Critical Value/emergent results were called by telephone at the time of interpretation on 11/24/2015 at 9:32 am to Dr. Charlies Silvers, who verbally acknowledged these results. Review of the MIP images confirms the above findings. IMPRESSION: 1. Single subsegmental embolism to the  lingula that is new from 07/09/2015. 2. Mild infectious or inflammatory airspace disease in the left lower lobe. 3. Right lower lobe atelectasis. 4. Hepatic steatosis and left nephrolithiasis. Electronically Signed   By: Monte Fantasia M.D.   On: 11/24/2015 09:34    Assessment & Plan:   Ruble was seen today for tachycardia.  Diagnoses and all orders for this visit:  Other acute pulmonary embolism without acute cor pulmonale  ([place])  Controlled type 2 diabetes mellitus without complication, without long-term current use of insulin (HCC)  Panlobular emphysema (HCC)  Palpitations  Sinus tachycardia    Patient is stable. Unclear what the tachycardias being driven by. Therefore her monitor applied. 24 hour period follow-up soon for complete physical exam.  I have discontinued Derek Shepherd's doxycycline and HYDROcodone-homatropine. I am also having him maintain his Multiple Vitamins, Polyethyl Glycol-Propyl Glycol, folic acid, vitamin 0000000, albuterol, Magnesium Chloride-Calcium, OXYGEN, polyethylene glycol powder, Sirolimus, montelukast, Probiotic Product (TRUBIOTICS PO), lactulose, levalbuterol, Ankle Lace-Up Brace, rOPINIRole, torsemide, aMILoride, ONE TOUCH ULTRA TEST, metFORMIN, SYMBICORT, SPIRIVA HANDIHALER, escitalopram, nefazodone, HYDROmorphone, fluticasone, pantoprazole, glucose blood, predniSONE, and potassium chloride.  No orders of the defined types were placed in this encounter.    Follow-up: Return in about 1 week (around 12/25/2015).  Claretta Fraise, M.D.

## 2015-12-22 NOTE — Progress Notes (Signed)
Subjective:  Patient ID: LINK BURGESON, male    DOB: 08-Mar-1955  Age: 60 y.o. MRN: [id number]  CC: Annual Exam (pt here for CPE, no problems voiced, he is still having trouble breathing but it is better than what it has been in the past.)   HPI LABRIAN TORREGROSSA presents for CPE. Brought in holter. Still has heart rate of 108.    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   He has a past surgical history that includes Cholecystectomy; Lung surgery (Left); Exploratory laparotomy; Hernia repair; Colonoscopy w/ biopsies; and Bone marrow transplant (2011).   His family history includes Clotting disorder in his mother; Diabetes in his brother, father, maternal aunt, and sister; Heart attack in his father and mother; Prostate cancer in his brother.He reports that he quit smoking about 22 years ago. His smoking use included Cigarettes. He has never used smokeless tobacco. He reports that he drinks alcohol. He reports that he does not use drugs.    ROS Review of Systems  Constitutional: Positive for fatigue. Negative for activity change, appetite change, chills, diaphoresis, fever and unexpected weight change.  HENT: Negative for congestion, ear pain, hearing loss, postnasal drip, rhinorrhea, sore throat, tinnitus and trouble swallowing.   Eyes: Negative for photophobia, pain, discharge and redness.  Respiratory: Negative for apnea, cough, choking, chest tightness, shortness of  breath, wheezing and stridor.   Cardiovascular: Positive for palpitations. Negative for chest pain and leg swelling.  Gastrointestinal: Negative for abdominal distention, abdominal pain, blood in stool, constipation, diarrhea, nausea and vomiting.  Endocrine: Negative for cold intolerance, heat intolerance, polydipsia, polyphagia and polyuria.  Genitourinary: Negative for difficulty urinating, dysuria, enuresis, flank pain, frequency, genital sores, hematuria and urgency.  Musculoskeletal: Negative for arthralgias and joint swelling.  Skin: Negative for color change, rash and wound.  Allergic/Immunologic: Negative for immunocompromised state.  Neurological: Positive for weakness. Negative for dizziness, tremors, seizures, syncope, facial asymmetry, speech difficulty, light-headedness, numbness and headaches.  Hematological: Does not bruise/bleed easily.  Psychiatric/Behavioral: Negative for agitation, behavioral problems, confusion, decreased concentration, dysphoric mood, hallucinations, sleep disturbance and suicidal ideas. The patient is not nervous/anxious and is not hyperactive.     Objective:  BP 137/78   Pulse (!) 102   Temp 98.7 F (37.1 C) (Oral)   Ht 5' 6"  (1.676 m)   Wt 206 lb (93.4 kg)   SpO2 97%   BMI 33.25 kg/m   BP Readings from Last 3 Encounters:  12/22/15 137/78  12/18/15 126/87  12/14/15 116/75    Wt Readings from Last 3 Encounters:  12/22/15 206 lb (93.4 kg)  12/18/15 204 lb 8 oz (92.8 kg)  12/14/15 199 lb 2 oz (90.3 kg)     Physical Exam  Constitutional: He is oriented to person, place, and time. He appears well-developed and well-nourished.  HENT:  Head: Normocephalic and atraumatic.  Mouth/Throat: Oropharynx is clear and moist.  Eyes: EOM are normal. Pupils are equal, round, and reactive to light.  Neck: Normal range of motion. No tracheal deviation present. No thyromegaly present.  Cardiovascular: Normal rate, regular rhythm and normal heart sounds.   Exam reveals no gallop  and no friction rub.   No murmur heard. Pulmonary/Chest: Breath sounds normal. He has no wheezes. He has no rales.  Wearing O2 [place]  Abdominal: Soft. He exhibits no mass. There is no tenderness.  Musculoskeletal: Normal range of motion. He exhibits no edema.  Neurological: He is alert and oriented to person, place, and time. He has normal reflexes. No cranial nerve deficit.  Skin: Skin is warm and dry.  Psychiatric: He has a normal mood and affect. His behavior is normal. Thought content normal.     Lab Results  Component Value Date   WBC 8.9 12/14/2015   HGB 8.7 (L) 11/26/2015   HCT 37.0 (L) 12/14/2015   PLT 101 (L) 12/14/2015   GLUCOSE 184 (H) 12/14/2015   CHOL 214 (H) 12/01/2014   TRIG 290 (H) 12/01/2014   HDL 48 12/01/2014   LDLCALC 108 (H) 12/01/2014   ALT 39 12/14/2015   AST 28 12/14/2015   NA 136 12/14/2015   K 3.5 12/14/2015   CL 83 (L) 12/14/2015   CREATININE 1.02 12/14/2015   BUN 20 12/14/2015   CO2 35 (H) 12/14/2015   PSA 1.0 02/15/2013   INR 1.00 11/24/2015   HGBA1C 5.6 12/27/2014   MICROALBUR negq 10/31/2013    Dg Chest 2 View  Result Date: 11/23/2015 CLINICAL DATA:  Decreased oxygen saturation with chest pain and shortness of breath EXAM: CHEST  2 VIEW COMPARISON:  Chest radiograph and chest CT Jul 09, 2015 FINDINGS: There is mild generalized interstitial prominence without frank edema or consolidation evident. Heart size and pulmonary vascularity are normal. No adenopathy. Port-A-Cath tip is at the cavoatrial junction. No pneumothorax. No bone lesions evident. There is postoperative change in the left hemithorax, stable. IMPRESSION: Mild generalized interstitial prominence without frank edema or consolidation evident by radiography. Stable cardiac silhouette. No pneumothorax. Electronically Signed   By: Lowella Grip III M.D.   On: 11/23/2015 17:24   Ct Angio Chest Pe W Or Wo Contrast  Result Date: 11/24/2015 CLINICAL DATA:  Shortness  of breath for 1 month, worsening for 4 days. History of DVT and pulmonary embolism. EXAM: CT ANGIOGRAPHY CHEST WITH CONTRAST TECHNIQUE: Multidetector CT imaging of the chest was performed using the standard protocol during bolus administration of intravenous contrast. Multiplanar CT image reconstructions and MIPs were obtained to evaluate the vascular anatomy. CONTRAST:  100 cc Isovue 370 intravenous COMPARISON:  07/09/2015 FINDINGS: Cardiovascular: Study optimized for the pulmonary arteries. There is a new subsegmental non-opacified pulmonary artery in the superior segment lingula, representative image 6:172. This is a new compared to prior. Although a branching filling defect is not seen at the more proximal bifurcation point, this vessel is convincingly non-opacified. No other embolic disease is identified. No cardiomegaly or pericardial effusion. Negative thoracic aorta. Mediastinum: No adenopathy. Porta catheter on the right with tip at the SVC level. Lungs/Pleura: Clustered indistinct nodules in the left lower lobe. Few areas of patchy ground-glass opacity in the central right lung. Right lower lobe atelectasis. Borderline generalized cylindrical bronchiectasis. No evidence of infarct. Upper abdomen: No acute findings. Hepatic steatosis. Left nephrolithiasis. Musculoskeletal: No acute or aggressive finding. Postoperative left chest wall. Critical Value/emergent results were called by telephone at the time of interpretation on 11/24/2015 at 9:32 am to Dr. Charlies Silvers, who verbally acknowledged these results. Review of the MIP images confirms the above findings. IMPRESSION: 1. Single subsegmental embolism to the lingula that is new from 07/09/2015. 2. Mild infectious or inflammatory airspace disease in the left lower lobe. 3.  Right lower lobe atelectasis. 4. Hepatic steatosis and left nephrolithiasis. Electronically Signed   By: Monte Fantasia M.D.   On: 11/24/2015 09:34    Assessment & Plan:   Moyses was seen  today for annual exam.  Diagnoses and all orders for this visit:  Screening for prostate cancer -     PSA Total (Reflex To Free)  Controlled type 2 diabetes mellitus without complication, without long-term current use of insulin (HCC) -     Bayer DCA Hb A1c Waived  Other orders -     Flu Vaccine QUAD 36+ mos IM -     CBC with Differential/Platelet -     CMP14+EGFR -     Lipid panel -     TSH + free T4      I am having Mr. Robleto maintain his Multiple Vitamins, Polyethyl Glycol-Propyl Glycol, folic acid, vitamin Z-66, albuterol, Magnesium Chloride-Calcium, OXYGEN, polyethylene glycol powder, Sirolimus, montelukast, Probiotic Product (TRUBIOTICS PO), lactulose, levalbuterol, Ankle Lace-Up Brace, rOPINIRole, torsemide, aMILoride, ONE TOUCH ULTRA TEST, metFORMIN, SYMBICORT, SPIRIVA HANDIHALER, escitalopram, nefazodone, HYDROmorphone, fluticasone, pantoprazole, apixaban, glucose blood, predniSONE, and potassium chloride.  No orders of the defined types were placed in this encounter.    Follow-up: No Follow-up on file.  Claretta Fraise, M.D.

## 2015-12-22 NOTE — Addendum Note (Signed)
Addended by: Claretta Fraise on: 12/22/2015 12:26 PM   Modules accepted: Orders

## 2015-12-23 LAB — CBC WITH DIFFERENTIAL/PLATELET
BASOS ABS: 0 10*3/uL (ref 0.0–0.2)
Basos: 0 %
EOS (ABSOLUTE): 0 10*3/uL (ref 0.0–0.4)
Eos: 0 %
Hematocrit: 27.7 % — ABNORMAL LOW (ref 37.5–51.0)
Hemoglobin: 9.2 g/dL — ABNORMAL LOW (ref 12.6–17.7)
IMMATURE GRANS (ABS): 0.1 10*3/uL (ref 0.0–0.1)
IMMATURE GRANULOCYTES: 1 %
LYMPHS: 34 %
Lymphocytes Absolute: 2.3 10*3/uL (ref 0.7–3.1)
MCH: 34.5 pg — ABNORMAL HIGH (ref 26.6–33.0)
MCHC: 33.2 g/dL (ref 31.5–35.7)
MCV: 104 fL — ABNORMAL HIGH (ref 79–97)
MONOS ABS: 0.4 10*3/uL (ref 0.1–0.9)
Monocytes: 6 %
NEUTROS PCT: 59 %
Neutrophils Absolute: 4.1 10*3/uL (ref 1.4–7.0)
PLATELETS: 56 10*3/uL — AB (ref 150–379)
RBC: 2.67 x10E6/uL — CL (ref 4.14–5.80)
RDW: 17.8 % — AB (ref 12.3–15.4)
WBC: 7 10*3/uL (ref 3.4–10.8)

## 2015-12-23 LAB — PSA TOTAL (REFLEX TO FREE): Prostate Specific Ag, Serum: 0.5 ng/mL (ref 0.0–4.0)

## 2015-12-23 LAB — LIPID PANEL
CHOL/HDL RATIO: 5.1 ratio — AB (ref 0.0–5.0)
CHOLESTEROL TOTAL: 328 mg/dL — AB (ref 100–199)
HDL: 64 mg/dL (ref >39)
TRIGLYCERIDES: 459 mg/dL — AB (ref 0–149)

## 2015-12-23 LAB — CMP14+EGFR
A/G RATIO: 2.4 — AB (ref 1.2–2.2)
ALK PHOS: 65 IU/L (ref 39–117)
ALT: 35 IU/L (ref 0–44)
AST: 25 IU/L (ref 0–40)
Albumin: 4.3 g/dL (ref 3.5–5.5)
BUN/Creatinine Ratio: 21 — ABNORMAL HIGH (ref 9–20)
BUN: 20 mg/dL (ref 6–24)
Bilirubin Total: <0.2 mg/dL (ref 0.0–1.2)
CALCIUM: 9.6 mg/dL (ref 8.7–10.2)
CHLORIDE: 86 mmol/L — AB (ref 96–106)
CO2: 34 mmol/L — ABNORMAL HIGH (ref 18–29)
Creatinine, Ser: 0.94 mg/dL (ref 0.76–1.27)
GFR calc Af Amer: 103 mL/min/{1.73_m2} (ref >59)
GFR, EST NON AFRICAN AMERICAN: 89 mL/min/{1.73_m2} (ref >59)
Globulin, Total: 1.8 g/dL (ref 1.5–4.5)
Glucose: 228 mg/dL — ABNORMAL HIGH (ref 65–99)
POTASSIUM: 3.2 mmol/L — AB (ref 3.5–5.2)
SODIUM: 138 mmol/L (ref 134–144)
Total Protein: 6.1 g/dL (ref 6.0–8.5)

## 2015-12-23 LAB — TSH+FREE T4
FREE T4: 0.84 ng/dL (ref 0.82–1.77)
TSH: 4.07 u[IU]/mL (ref 0.450–4.500)

## 2015-12-26 DIAGNOSIS — R002 Palpitations: Secondary | ICD-10-CM | POA: Diagnosis not present

## 2015-12-28 ENCOUNTER — Telehealth: Payer: Self-pay | Admitting: Family Medicine

## 2015-12-29 ENCOUNTER — Encounter: Payer: Self-pay | Admitting: Physician Assistant

## 2015-12-29 ENCOUNTER — Ambulatory Visit (INDEPENDENT_AMBULATORY_CARE_PROVIDER_SITE_OTHER): Payer: Medicare HMO | Admitting: Pharmacist

## 2015-12-29 ENCOUNTER — Ambulatory Visit (INDEPENDENT_AMBULATORY_CARE_PROVIDER_SITE_OTHER): Payer: Medicare HMO | Admitting: Physician Assistant

## 2015-12-29 VITALS — BP 118/68 | HR 88 | Temp 97.7°F | Ht 66.0 in | Wt 210.0 lb

## 2015-12-29 DIAGNOSIS — L03114 Cellulitis of left upper limb: Secondary | ICD-10-CM

## 2015-12-29 DIAGNOSIS — E1165 Type 2 diabetes mellitus with hyperglycemia: Secondary | ICD-10-CM

## 2015-12-29 MED ORDER — PREDNISONE 20 MG PO TABS: ORAL_TABLET | ORAL | 0 refills | Status: DC

## 2015-12-29 MED ORDER — METFORMIN HCL 1000 MG PO TABS: 1000.0000 mg | ORAL_TABLET | Freq: Two times a day (BID) | ORAL | 2 refills | Status: DC

## 2015-12-29 MED ORDER — CEPHALEXIN 500 MG PO CAPS: 500.0000 mg | ORAL_CAPSULE | Freq: Four times a day (QID) | ORAL | 0 refills | Status: DC

## 2015-12-29 MED ORDER — PREDNISONE 10 MG PO TABS: 10.0000 mg | ORAL_TABLET | Freq: Every day | ORAL | 0 refills | Status: DC

## 2015-12-29 NOTE — Progress Notes (Signed)
BP 118/68   Pulse 88   Temp 97.7 F (36.5 C) (Oral)   Ht 5\' 6"  (1.676 m)   Wt 210 lb (95.3 kg)   BMI 33.89 kg/m    Subjective:    Patient ID: Derek Shepherd, male    DOB: [birth year], 57 y.o.   MRN: [id number]  HPI: Derek Shepherd is a 60 y.o. male presenting on 12/29/2015 for Arm Pain (Patients left arm is red and hot where he had flu shot )  Received a regular flu vaccine in the left triceps, and has had redness in the arm inferiorly to the injection site. Started a week after the injection.  Denies fever or chills. Has no history of MRSA.  Past Medical History:  Diagnosis Date  . Adenomatous colon polyp    tubular  . Anemia   . Anxiety   . Arthritis   . Asthma   . Bowel obstruction    constipation  . CHF (congestive heart failure) ([place])   . COPD (chronic obstructive pulmonary disease) ([place])   . Depression   . Diverticulosis   . DVT (deep venous thrombosis) ([place])   . Gallstones   . GERD (gastroesophageal reflux disease)   . 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 History of bone marrow transplant ([place])   . Leukemia-lymphoma, T-cell, acute, HTLV-I-associated ([place])   . Personal history of colonic  adenoma 01/22/2008   Relevant past medical, surgical, family and social history reviewed and updated as indicated. Interim medical history since our last visit reviewed. Allergies and medications reviewed and updated. DATA REVIEWED: CHART IN EPIC    Past Surgical History:  Procedure Laterality Date  . BONE MARROW TRANSPLANT  2011  . CHOLECYSTECTOMY    . COLONOSCOPY W/ BIOPSIES    . EXPLORATORY LAPAROTOMY    . HERNIA REPAIR     x 2  . LUNG SURGERY Left     Family History  Problem Relation Age of Onset  . Clotting disorder Mother   . Heart attack Mother   . Diabetes Father   . Heart attack Father   . Prostate cancer Brother   . Diabetes Brother     x 3  . Diabetes Sister     x 3  . Diabetes Maternal Aunt      Review of Systems  Constitutional: Negative.  Negative for appetite change and fatigue.  Eyes: Negative for pain and visual disturbance.  Respiratory: Negative.  Negative for cough, chest tightness, shortness of breath and wheezing.   Cardiovascular: Negative.  Negative for chest pain, palpitations and leg swelling.  Gastrointestinal: Negative.  Negative for abdominal pain, diarrhea, nausea and vomiting.  Genitourinary: Negative.   Skin: Positive for color change and rash.  Neurological: Negative.  Negative for weakness, numbness and headaches.  Psychiatric/Behavioral: Negative.       Medication List       Accurate as of 12/29/15  4:54 PM. Always use your most recent med list.          albuterol (2.5 MG/3ML) 0.083% nebulizer solution Commonly known as:  PROVENTIL Take 3 mLs (2.5 mg total) by nebulization every 6 (six) hours as needed for wheezing or shortness of breath.   aMILoride 5 MG tablet Commonly known as:  MIDAMOR Take 1 tablet by mouth daily.   Ankle Lace-Up Brace Misc Wear daily for two weeks then as needed ASO  Style brace   apixaban 5 MG Tabs tablet Commonly known as:  ELIQUIS Take 1 tablet (5 mg total) by mouth 2 (two) times daily.   cephALEXin 500 MG capsule Commonly known as:  KEFLEX Take 1 capsule (500 mg total) by mouth 4 (four) times daily.   escitalopram 10 MG tablet Commonly known as:  LEXAPRO TAKE ONE TABLET BY MOUTH ONCE DAILY FOR DEPRESSION   fluticasone 50 MCG/ACT nasal spray Commonly known as:  FLONASE Place 1 spray into both nostrils daily.   folic acid 1 MG tablet Commonly known as:  FOLVITE Take 1 mg by mouth daily.   HYDROmorphone 4 MG tablet Commonly known as:  DILAUDID Take 4 mg by mouth 4 (four) times daily as needed for severe pain.   lactulose 10 GM/15ML solution Commonly known as:  CHRONULAC Take 30 mLs (20 g total) by mouth 2 (two) times daily. For constipation   levalbuterol 1.25 MG/0.5ML nebulizer  solution Commonly known as:  XOPENEX Take 1.25 mg by nebulization 4 (four) times daily.   Magnesium Chloride-Calcium 64-106 MG Tbec Take 2 tablets by mouth daily.   metFORMIN 1000 MG tablet Commonly known as:  GLUCOPHAGE Take 1 tablet (1,000 mg total) by mouth 2 (two) times daily with a meal.   montelukast 10 MG tablet Commonly known as:  SINGULAIR Take 10 mg by mouth daily.   Multiple Vitamins tablet Take 1 tablet by mouth daily.   nefazodone 50 MG tablet Commonly known as:  SERZONE Take 50 mg by mouth at bedtime.   ONE TOUCH ULTRA TEST test strip Generic drug:  glucose blood   glucose blood test strip Commonly known as:  ONE TOUCH ULTRA TEST Test once daily   OXYGEN Inhale 3-5 L into the lungs continuous. Continuous 5 LPM when up and about 3 LPM constant and at night   pantoprazole 40 MG tablet Commonly known as:  PROTONIX Take 40 mg by mouth 2 (two) times daily.   polyethylene glycol powder powder Commonly known as:  GLYCOLAX/MIRALAX Take 17 g by mouth 2 (two) times daily as needed.   potassium chloride 10 MEQ CR capsule Commonly known as:  MICRO-K TAKE THREE CAPSULES BY MOUTH TWICE DAILY   predniSONE 10 MG tablet Commonly known as:  DELTASONE Take 1 tablet (10 mg total) by mouth daily with breakfast.   predniSONE 20 MG tablet Commonly known as:  DELTASONE 1 twice daily for one week then one daily for one week then discontinue   rOPINIRole 1 MG tablet Commonly known as:  REQUIP TAKE 1 TABLET BY MOUTH AT BEDTIME   Sirolimus 0.5 MG Tabs Take 0.5 mg by mouth daily.   SPIRIVA HANDIHALER 18 MCG inhalation capsule Generic drug:  tiotropium PLACE THE CONTENTS OF ONE CAPSULE INTO INHALER AND INHALE ONCE DAILY   SYMBICORT 160-4.5 MCG/ACT inhaler Generic drug:  budesonide-formoterol INHALE TWO PUFFS BY MOUTH ONCE DAILY IN THE MORNING   SYSTANE PRESERVATIVE FREE 0.4-0.3 % Soln Generic drug:  Polyethyl Glycol-Propyl Glycol Place 1 drop into both eyes daily  as needed. For dry eyes   torsemide 100 MG tablet Commonly known as:  DEMADEX Take 2 tablets (200 mg total) by mouth daily.   TRUBIOTICS PO Take 1 capsule by mouth daily.   vitamin B-12 1000 MCG tablet Commonly known as:  CYANOCOBALAMIN Take 1,000 mcg by mouth daily.          Objective:    BP 118/68   Pulse 88   Temp 97.7 F (36.5 C) (Oral)   Ht 5\' 6"  (1.676 m)   Wt 210 lb (  95.3 kg)   BMI 33.89 kg/m   Allergies  Allergen Reactions  . Nsaids Shortness Of Breath and Swelling    Wt Readings from Last 3 Encounters:  12/29/15 210 lb (95.3 kg)  12/22/15 206 lb (93.4 kg)  12/18/15 204 lb 8 oz (92.8 kg)    Physical Exam  Constitutional: He appears well-developed and well-nourished. No distress.  HENT:  Head: Normocephalic and atraumatic.  Eyes: Conjunctivae and EOM are normal. Pupils are equal, round, and reactive to light.  Cardiovascular: Normal rate, regular rhythm and normal heart sounds.   Pulmonary/Chest: Effort normal and breath sounds normal. No respiratory distress.  Skin: Skin is warm and dry. Rash noted. Rash is macular. There is erythema.     Psychiatric: He has a normal mood and affect. His behavior is normal.  Nursing note and vitals reviewed.       Assessment & Plan:   1. Cellulitis of left upper extremity Reaction vs secondary infection from influenza vaccine (regular vaccine given - cephALEXin (KEFLEX) 500 MG capsule; Take 1 capsule (500 mg total) by mouth 4 (four) times daily.  Dispense: 40 capsule; Refill: 0 -deltasone 20 mg one daily 10 days  Continue all other maintenance medications as listed above.  Follow up plan: Prn or worsening of symptoms  Educational handout given for cellulitis  Terald Sleeper PA-C [facility] [address]  [place], [place] [address] [phone]   12/29/2015, 4:54 PM

## 2015-12-29 NOTE — Patient Instructions (Signed)
Goal Blood glucose:    Fasting (before meals) = 80 to 130   Within 2 hours of eating = less than 180  Try to have no more than 3 of these foods per meal:  Fruit:   1/2 cup or once piece (baseball size)- apples, pears, pineapple, peaches, oranges  1 cup - berries, melons  1/2 banana or grapefruit  Stachy Vegetables:   1/2 cup potatoes (white or sweet), corn, peas  Other starches:   1 piece of bread  1/2 cup rice or pasta  4 inch pancake    These foods you can eat more freely: Proteins:   Fish  Chicken or [place]  Beef or pork (1 or 2 servings per week)  Eggs  Nuts (peanuts, walnuts, almonds, pistachios)  Cheese  Non starchy vegetables:  Green beans  Broccoli or cauliflower  Lettuce, greens, cabbage  Brussel Sprout  Carrots  Onions and peppers  Celery  Tomatoes  Asparagus  Eggplant  Cucumbers  Squash and Zucchini

## 2015-12-29 NOTE — Patient Instructions (Signed)

## 2015-12-29 NOTE — Progress Notes (Signed)
Patient c/o rash on left upper arm. Rash was erythematous and warm to touch.  Patient triage to provider for evaluation.  Cherre Robins, PharmD, CPP

## 2015-12-30 DIAGNOSIS — R69 Illness, unspecified: Secondary | ICD-10-CM | POA: Diagnosis not present

## 2015-12-31 ENCOUNTER — Other Ambulatory Visit (INDEPENDENT_AMBULATORY_CARE_PROVIDER_SITE_OTHER): Payer: Medicare HMO

## 2015-12-31 DIAGNOSIS — D696 Thrombocytopenia, unspecified: Secondary | ICD-10-CM | POA: Diagnosis not present

## 2016-01-01 ENCOUNTER — Ambulatory Visit (INDEPENDENT_AMBULATORY_CARE_PROVIDER_SITE_OTHER): Payer: Medicare HMO | Admitting: Family Medicine

## 2016-01-01 ENCOUNTER — Encounter: Payer: Self-pay | Admitting: Family Medicine

## 2016-01-01 VITALS — BP 130/67 | HR 107 | Temp 98.2°F | Ht 66.0 in | Wt 210.0 lb

## 2016-01-01 DIAGNOSIS — D500 Iron deficiency anemia secondary to blood loss (chronic): Secondary | ICD-10-CM

## 2016-01-01 DIAGNOSIS — I2699 Other pulmonary embolism without acute cor pulmonale: Secondary | ICD-10-CM

## 2016-01-01 DIAGNOSIS — J431 Panlobular emphysema: Secondary | ICD-10-CM | POA: Diagnosis not present

## 2016-01-01 LAB — CBC WITH DIFFERENTIAL/PLATELET
Basophils Absolute: 0 10*3/uL (ref 0.0–0.2)
Basos: 0 %
EOS (ABSOLUTE): 0 10*3/uL (ref 0.0–0.4)
EOS: 0 %
HEMOGLOBIN: 8.1 g/dL — AB (ref 12.6–17.7)
Hematocrit: 24.6 % — ABNORMAL LOW (ref 37.5–51.0)
IMMATURE GRANULOCYTES: 1 %
Immature Grans (Abs): 0 10*3/uL (ref 0.0–0.1)
LYMPHS ABS: 1.8 10*3/uL (ref 0.7–3.1)
Lymphs: 35 %
MCH: 34.6 pg — AB (ref 26.6–33.0)
MCHC: 32.9 g/dL (ref 31.5–35.7)
MCV: 105 fL — ABNORMAL HIGH (ref 79–97)
MONOS ABS: 0.5 10*3/uL (ref 0.1–0.9)
Monocytes: 10 %
NEUTROS PCT: 54 %
Neutrophils Absolute: 2.7 10*3/uL (ref 1.4–7.0)
Platelets: 53 10*3/uL — CL (ref 150–379)
RBC: 2.34 x10E6/uL — AB (ref 4.14–5.80)
RDW: 18.4 % — AB (ref 12.3–15.4)
WBC: 5 10*3/uL (ref 3.4–10.8)

## 2016-01-01 NOTE — Progress Notes (Signed)
Subjective:  Patient ID: Derek Shepherd, male    DOB: 1955/07/02  Age: 60 y.o. MRN: [id number]  CC: Results (pt here to go over holter monitor results)   HPI CHANLER ARTIS presents for Feeling weaker and more short of breath. However he is not having chest pain. He denies any melena or hematochezia. He had a CBC drawn prior to the visit today and it showed his hemoglobin has dropped significantly. His Holter monitor her report has been received and shows some minor PACs, no evidence for A. fib or significant PVCs.   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   He has a past surgical history that includes Cholecystectomy; Lung surgery (Left); Exploratory laparotomy; Hernia repair; Colonoscopy w/ biopsies; and Bone marrow transplant (2011).   His family history includes Clotting disorder in his mother; Diabetes in his brother, father, maternal aunt, and sister; Heart attack in his father and mother; Prostate cancer in his brother.He reports that he quit smoking about 22 years ago. His smoking use included Cigarettes. He has never used smokeless tobacco. He reports that he drinks alcohol. He reports that he does not use drugs.    ROS Review of Systems  Constitutional: Negative for chills, diaphoresis, fever and unexpected weight change.  HENT: Negative for congestion, hearing loss, rhinorrhea and sore throat.   Eyes: Negative for visual disturbance.  Respiratory: Positive for  shortness of breath. Negative for cough.   Cardiovascular: Negative for chest pain.  Gastrointestinal: Negative for abdominal pain, constipation and diarrhea.  Genitourinary: Negative for dysuria and flank pain.  Musculoskeletal: Negative for arthralgias and joint swelling.  Skin: Negative for rash.  Neurological: Negative for dizziness and headaches.  Psychiatric/Behavioral: Negative for dysphoric mood and sleep disturbance.    Objective:  BP 130/67   Pulse (!) 107   Temp 98.2 F (36.8 C) (Oral)   Ht 5\' 6"  (1.676 m)   Wt 210 lb (95.3 kg)   BMI 33.89 kg/m   BP Readings from Last 3 Encounters:  01/01/16 130/67  12/29/15 118/68  12/22/15 137/78    Wt Readings from Last 3 Encounters:  01/01/16 210 lb (95.3 kg)  12/29/15 210 lb (95.3 kg)  12/22/15 206 lb (93.4 kg)     Physical Exam  Constitutional: He is oriented to person, place, and time. He appears well-developed and well-nourished. No distress.  HENT:  Head: Normocephalic and atraumatic.  Right Ear: External ear normal.  Left Ear: External ear normal.  Nose: Nose normal.  Mouth/Throat: Oropharynx is clear and moist.  Eyes: Conjunctivae and EOM are normal. Pupils are equal, round, and reactive to light.  Neck: Normal range of motion. Neck supple. No thyromegaly present.  Cardiovascular: Normal rate, regular rhythm and normal heart sounds.   No murmur heard. Pulmonary/Chest: Effort normal and breath sounds normal. No respiratory distress. He has no wheezes. He has no rales.  Abdominal: Soft. Bowel sounds are normal. He exhibits no distension. There is no tenderness.  Lymphadenopathy:    He has no cervical adenopathy.  Neurological: He is alert and oriented to person, place, and time. He has normal reflexes.  Skin: Skin is  warm and dry.  Psychiatric: He has a normal mood and affect. His behavior is normal. Judgment and thought content normal.     Lab Results  Component Value Date   WBC 5.0 12/31/2015   HGB 8.7  (L) 11/26/2015   HCT 24.6 (L) 12/31/2015   PLT 53 (LL) 12/31/2015   GLUCOSE 228 (H) 12/22/2015   CHOL 328 (H) 12/22/2015   TRIG 459 (H) 12/22/2015   HDL 64 12/22/2015   LDLCALC Comment 12/22/2015   ALT 35 12/22/2015   AST 25 12/22/2015   NA 138 12/22/2015   K 3.2 (L) 12/22/2015   CL 86 (L) 12/22/2015   CREATININE 0.94 12/22/2015   BUN 20 12/22/2015   CO2 34 (H) 12/22/2015   TSH 4.070 12/22/2015   PSA 1.0 02/15/2013   INR 1.00 11/24/2015   HGBA1C 5.6 12/27/2014   MICROALBUR negq 10/31/2013    Dg Chest 2 View  Result Date: 11/23/2015 CLINICAL DATA:  Decreased oxygen saturation with chest pain and shortness of breath EXAM: CHEST  2 VIEW COMPARISON:  Chest radiograph and chest CT Jul 09, 2015 FINDINGS: There is mild generalized interstitial prominence without frank edema or consolidation evident. Heart size and pulmonary vascularity are normal. No adenopathy. Port-A-Cath tip is at the cavoatrial junction. No pneumothorax. No bone lesions evident. There is postoperative change in the left hemithorax, stable. IMPRESSION: Mild generalized interstitial prominence without frank edema or consolidation evident by radiography. Stable cardiac silhouette. No pneumothorax. Electronically Signed   By: Lowella Grip III M.D.   On: 11/23/2015 17:24   Ct Angio Chest Pe W Or Wo Contrast  Result Date: 11/24/2015 CLINICAL DATA:  Shortness of breath for 1 month, worsening for 4 days. History of DVT and pulmonary embolism. EXAM: CT ANGIOGRAPHY CHEST WITH CONTRAST TECHNIQUE: Multidetector CT imaging of the chest was performed using the standard protocol during bolus administration of intravenous contrast. Multiplanar CT image reconstructions and MIPs were obtained to evaluate the vascular anatomy. CONTRAST:  100 cc Isovue 370 intravenous COMPARISON:  07/09/2015 FINDINGS: Cardiovascular: Study optimized for the pulmonary arteries. There is a new subsegmental non-opacified pulmonary artery in the superior  segment lingula, representative image 6:172. This is a new compared to prior. Although a branching filling defect is not seen at the more proximal bifurcation point, this vessel is convincingly non-opacified. No other embolic disease is identified. No cardiomegaly or pericardial effusion. Negative thoracic aorta. Mediastinum: No adenopathy. Porta catheter on the right with tip at the SVC level. Lungs/Pleura: Clustered indistinct nodules in the left lower lobe. Few areas of patchy ground-glass opacity in the central right lung. Right lower lobe atelectasis. Borderline generalized cylindrical bronchiectasis. No evidence of infarct. Upper abdomen: No acute findings. Hepatic steatosis. Left nephrolithiasis. Musculoskeletal: No acute or aggressive finding. Postoperative left chest wall. Critical Value/emergent results were called by telephone at the time of interpretation on 11/24/2015 at 9:32 am to Dr. Charlies Silvers, who verbally acknowledged these results. Review of the MIP images confirms the above findings. IMPRESSION: 1. Single subsegmental embolism to the lingula that is new from 07/09/2015. 2. Mild infectious or inflammatory airspace disease in the left lower lobe. 3. Right lower lobe atelectasis. 4. Hepatic steatosis and left nephrolithiasis. Electronically Signed   By: Monte Fantasia M.D.   On: 11/24/2015 09:34    Assessment & Plan:   [place] was seen today for results.  Diagnoses and all orders for this visit:  Panlobular emphysema ([place])  Other acute pulmonary embolism without acute cor pulmonale ([place])  Blood  loss anemia -     Ambulatory referral to Hematology    2 units packed red cell transfusion ordered.  I am having Mr. Shaddock maintain his Multiple Vitamins, Polyethyl Glycol-Propyl Glycol, folic acid, vitamin 0000000, albuterol, Magnesium Chloride-Calcium, OXYGEN, polyethylene glycol powder, Sirolimus, montelukast, Probiotic Product (TRUBIOTICS PO), lactulose, levalbuterol, Ankle Lace-Up Brace,  rOPINIRole, torsemide, aMILoride, ONE TOUCH ULTRA TEST, SYMBICORT, SPIRIVA HANDIHALER, escitalopram, nefazodone, HYDROmorphone, fluticasone, pantoprazole, glucose blood, potassium chloride, apixaban, metFORMIN, cephALEXin, and predniSONE.  No orders of the defined types were placed in this encounter.    Follow-up: Return in about 1 week (around 01/08/2016).  Claretta Fraise, M.D.

## 2016-01-04 ENCOUNTER — Other Ambulatory Visit (HOSPITAL_COMMUNITY): Payer: Medicare HMO

## 2016-01-04 ENCOUNTER — Telehealth (HOSPITAL_COMMUNITY): Payer: Self-pay | Admitting: Oncology

## 2016-01-04 ENCOUNTER — Encounter (HOSPITAL_COMMUNITY)
Admission: RE | Admit: 2016-01-04 | Discharge: 2016-01-04 | Disposition: A | Discharge status: Home / Self Care | Payer: Medicare HMO | Source: Ambulatory Visit | Attending: Family Medicine | Admitting: Family Medicine

## 2016-01-04 DIAGNOSIS — D509 Iron deficiency anemia, unspecified: Secondary | ICD-10-CM | POA: Diagnosis not present

## 2016-01-04 LAB — PREPARE RBC (CROSSMATCH)

## 2016-01-04 LAB — HEMOGLOBIN AND HEMATOCRIT, BLOOD
HCT: 27.2 % — ABNORMAL LOW (ref 39.0–52.0)
HEMOGLOBIN: 8.9 g/dL — AB (ref 13.0–17.0)

## 2016-01-04 MED ORDER — HEPARIN SOD (PORK) LOCK FLUSH 100 UNIT/ML IV SOLN
INTRAVENOUS | Status: AC
  Filled 2016-01-04: qty 5

## 2016-01-04 MED ORDER — FUROSEMIDE 10 MG/ML IJ SOLN
20.0000 mg | Freq: Once | INTRAMUSCULAR | Status: AC
  Administered 2016-01-04: 40 mg via INTRAVENOUS
  Filled 2016-01-04: qty 4

## 2016-01-04 MED ORDER — HEPARIN SOD (PORK) LOCK FLUSH 100 UNIT/ML IV SOLN
500.0000 [IU] | INTRAVENOUS | Status: AC | PRN
  Administered 2016-01-04: 500 [IU]

## 2016-01-04 MED ORDER — METHYLPREDNISOLONE SODIUM SUCC 125 MG IJ SOLR
125.0000 mg | Freq: Once | INTRAMUSCULAR | Status: AC
  Administered 2016-01-04: 125 mg via INTRAVENOUS
  Filled 2016-01-04: qty 2

## 2016-01-04 MED ORDER — SODIUM CHLORIDE 0.9 % IV SOLN
Freq: Once | INTRAVENOUS | Status: AC
  Administered 2016-01-04: 08:00:00 via INTRAVENOUS

## 2016-01-04 NOTE — Telephone Encounter (Signed)
Referral noted.  Patient's chart reviewed.  He is a patient of Dr. Burnett Sheng at [facility] with a past medical history significant for ALL with chronic GVHD.  With his acute change in platelet count and acute drop in HGB.  We recommend the patient return to [facility] for follow-up appointment as soon as possible.    I called [facility] to discussed with Dr. Livia Snellen.  He is unavailable today.  Sofhia Ulibarri, PA-C 01/04/2016 4:00 PM

## 2016-01-05 ENCOUNTER — Ambulatory Visit (INDEPENDENT_AMBULATORY_CARE_PROVIDER_SITE_OTHER): Payer: Medicare HMO | Admitting: Family Medicine

## 2016-01-05 DIAGNOSIS — J189 Pneumonia, unspecified organism: Secondary | ICD-10-CM | POA: Diagnosis not present

## 2016-01-05 DIAGNOSIS — J441 Chronic obstructive pulmonary disease with (acute) exacerbation: Secondary | ICD-10-CM

## 2016-01-05 DIAGNOSIS — J440 Chronic obstructive pulmonary disease with acute lower respiratory infection: Secondary | ICD-10-CM

## 2016-01-05 DIAGNOSIS — A419 Sepsis, unspecified organism: Secondary | ICD-10-CM | POA: Diagnosis not present

## 2016-01-05 LAB — TYPE AND SCREEN
ABO/RH(D): A POS
ANTIBODY SCREEN: NEGATIVE
UNIT DIVISION: 0
Unit division: 0

## 2016-01-05 NOTE — Telephone Encounter (Signed)
Please Arrange for pt. To see Dr. Harvel Ricks ASAP

## 2016-01-05 NOTE — Telephone Encounter (Signed)
Patient and wife aware that patient needs to follow up with Dr. Beaulah Dinning ASAP.

## 2016-01-06 ENCOUNTER — Encounter: Payer: Self-pay | Admitting: Family Medicine

## 2016-01-06 ENCOUNTER — Ambulatory Visit (INDEPENDENT_AMBULATORY_CARE_PROVIDER_SITE_OTHER): Payer: Medicare HMO | Admitting: Family Medicine

## 2016-01-06 VITALS — BP 136/81 | HR 104 | Ht 66.0 in | Wt 204.0 lb

## 2016-01-06 DIAGNOSIS — D696 Thrombocytopenia, unspecified: Secondary | ICD-10-CM | POA: Diagnosis not present

## 2016-01-06 DIAGNOSIS — J431 Panlobular emphysema: Secondary | ICD-10-CM

## 2016-01-06 DIAGNOSIS — Z9481 Bone marrow transplant status: Secondary | ICD-10-CM | POA: Diagnosis not present

## 2016-01-06 DIAGNOSIS — I2699 Other pulmonary embolism without acute cor pulmonale: Secondary | ICD-10-CM | POA: Diagnosis not present

## 2016-01-06 DIAGNOSIS — D500 Iron deficiency anemia secondary to blood loss (chronic): Secondary | ICD-10-CM

## 2016-01-06 DIAGNOSIS — C9101 Acute lymphoblastic leukemia, in remission: Secondary | ICD-10-CM | POA: Diagnosis not present

## 2016-01-06 LAB — FINGERSTICK HEMOGLOBIN: HEMOGLOBIN: 11.2 g/dL — AB (ref 12.6–17.7)

## 2016-01-06 NOTE — Progress Notes (Signed)
Subjective:  Patient ID: Derek Shepherd, male    DOB: 1955-12-13  Age: 60 y.o. MRN: [id number]  CC: Anemia (pt here for follow up after having 2 units of PRBC on Monday, he states he is feeling better)   HPI Derek Shepherd presents for Recheck of his hemoglobin. He had 2 units of packed red cells 2 days ago. He also was noted to have a low platelet count. He does say that he is breathing better now. He had commented about being short of breath when he was here several days ago. At that time his hemoglobin had dropped to 8.1. He has been contacted by hematology at [facility]. They are planning to do a bone marrow biopsy states his wife. She is in attendance with him today. His appointment is scheduled for 6 days from now. There is concern for rejection of his bone marrow transplant and the possibility of graft versus host reaction. Additionally the patient is no longer taking anticoagulation therapy. This is in spite of the recent pulmonary embolism. However risk appears greater from the anemia at this time.   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   He has a past surgical history that includes Cholecystectomy; Lung surgery (Left); Exploratory laparotomy; Hernia repair; Colonoscopy w/ biopsies; and Bone marrow transplant (2011).   His family history includes Clotting disorder in his mother; Diabetes in his brother, father, maternal aunt, and sister; Heart attack in his father  and mother; Prostate cancer in his brother.He reports that he quit smoking about 22 years ago. His smoking use included Cigarettes. He has never used smokeless tobacco. He reports that he drinks alcohol. He reports that he does not use drugs.    ROS Review of Systems  Constitutional: Negative for chills, diaphoresis and fever.  HENT: Negative for rhinorrhea and sore throat.   Respiratory: Positive for shortness of breath (baseline, wearing O2 cannula). Negative for cough.   Cardiovascular: Negative for chest pain and palpitations.  Gastrointestinal: Negative for abdominal pain, anal bleeding, blood in stool (denies melena), constipation, diarrhea, nausea, rectal pain and vomiting.  Musculoskeletal: Negative for arthralgias and myalgias.  Skin: Negative for rash.  Neurological: Positive for weakness (generalized, nonfocal). Negative for headaches.    Objective:  BP 136/81   Pulse (!) 104   Ht 5' 6"  (1.676 m)   Wt 204 lb (92.5 kg)   SpO2 97%   BMI 32.93 kg/m   BP Readings from Last 3 Encounters:  01/06/16 136/81  01/04/16 (!) 144/78  01/01/16 130/67    Wt Readings from Last 3 Encounters:  01/06/16 204 lb (92.5 kg)  01/01/16 210 lb (95.3 kg)  12/29/15 210 lb (95.3 kg)     Physical Exam  Constitutional: He appears well-developed and well-nourished.  HENT:  Head: Normocephalic and atraumatic.  Right Ear: Tympanic membrane and external ear normal. No decreased hearing is noted.  Left Ear: Tympanic membrane and external ear normal. No decreased hearing is noted.  Mouth/Throat: No oropharyngeal exudate or posterior oropharyngeal erythema.  Eyes: Pupils are equal, round, and reactive to light.  Neck: Normal range of motion. Neck supple.  Cardiovascular:  Normal rate and regular rhythm.   No murmur heard. Pulmonary/Chest: Breath sounds normal. No respiratory distress.  Wearing O2 cannula, 4  Liters   Abdominal: Soft. Bowel sounds are normal. He exhibits no mass. There is no  tenderness.  Skin: Skin is warm and dry. No rash noted. No erythema. No pallor.  Vitals reviewed.    Lab Results  Component Value Date   WBC 5.0 12/31/2015   HGB 8.9 (L) 01/04/2016   HCT 27.2 (L) 01/04/2016   PLT 53 (LL) 12/31/2015   GLUCOSE 228 (H) 12/22/2015   CHOL 328 (H) 12/22/2015   TRIG 459 (H) 12/22/2015   HDL 64 12/22/2015   LDLCALC Comment 12/22/2015   ALT 35 12/22/2015   AST 25 12/22/2015   NA 138 12/22/2015   K 3.2 (L) 12/22/2015   CL 86 (L) 12/22/2015   CREATININE 0.94 12/22/2015   BUN 20 12/22/2015   CO2 34 (H) 12/22/2015   TSH 4.070 12/22/2015   PSA 1.0 02/15/2013   INR 1.00 11/24/2015   HGBA1C 5.6 12/27/2014   MICROALBUR negq 10/31/2013    No results found.  Assessment & Plan:   Derek Shepherd was seen today for anemia.  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 Increased risk of embolism is unfortunate but necessary due to the drop in hemoglobin experienced recently. Hopefully this can be addressed at the time of the bone marrow biopsy. If it turns out that the anemia is not truly blood loss but related to the bone marrow then resuming the anticoagulant may be possible in order to reduce his risk of clotting disease. However, his thrombocytopenia may temporarily work in his favor in that he has less risk of clotting with a low platelet count.  I have discontinued Mr. Schumacher's Ankle Lace-Up Brace. I am also having him maintain his Multiple Vitamins, Polyethyl Glycol-Propyl Glycol, folic acid, vitamin Y-10, albuterol, Magnesium Chloride-Calcium, OXYGEN, polyethylene glycol powder, Sirolimus, montelukast, Probiotic Product (TRUBIOTICS PO), lactulose,  levalbuterol, rOPINIRole, torsemide, aMILoride, ONE TOUCH ULTRA TEST, SYMBICORT, SPIRIVA HANDIHALER, escitalopram, nefazodone, fluticasone, pantoprazole, glucose blood, potassium chloride, apixaban, metFORMIN, cephALEXin, and predniSONE.  No orders of the defined types were placed in this encounter.    Follow-up: Return in about 6 weeks (around 02/17/2016).  Claretta Fraise, M.D.

## 2016-01-12 DIAGNOSIS — D649 Anemia, unspecified: Secondary | ICD-10-CM | POA: Diagnosis not present

## 2016-01-12 DIAGNOSIS — C9101 Acute lymphoblastic leukemia, in remission: Secondary | ICD-10-CM | POA: Diagnosis not present

## 2016-01-12 DIAGNOSIS — D696 Thrombocytopenia, unspecified: Secondary | ICD-10-CM | POA: Diagnosis not present

## 2016-01-12 DIAGNOSIS — Z9484 Stem cells transplant status: Secondary | ICD-10-CM | POA: Diagnosis not present

## 2016-01-14 ENCOUNTER — Telehealth: Payer: Self-pay | Admitting: Family Medicine

## 2016-01-14 DIAGNOSIS — E119 Type 2 diabetes mellitus without complications: Secondary | ICD-10-CM

## 2016-01-19 DIAGNOSIS — C9101 Acute lymphoblastic leukemia, in remission: Secondary | ICD-10-CM | POA: Diagnosis not present

## 2016-01-19 DIAGNOSIS — D89813 Graft-versus-host disease, unspecified: Secondary | ICD-10-CM | POA: Diagnosis not present

## 2016-01-19 DIAGNOSIS — J449 Chronic obstructive pulmonary disease, unspecified: Secondary | ICD-10-CM | POA: Diagnosis not present

## 2016-01-19 DIAGNOSIS — D801 Nonfamilial hypogammaglobulinemia: Secondary | ICD-10-CM | POA: Diagnosis not present

## 2016-01-26 DIAGNOSIS — D89813 Graft-versus-host disease, unspecified: Secondary | ICD-10-CM | POA: Diagnosis not present

## 2016-01-26 DIAGNOSIS — Z9484 Stem cells transplant status: Secondary | ICD-10-CM | POA: Diagnosis not present

## 2016-01-26 DIAGNOSIS — C9101 Acute lymphoblastic leukemia, in remission: Secondary | ICD-10-CM | POA: Diagnosis not present

## 2016-01-27 ENCOUNTER — Other Ambulatory Visit: Payer: Self-pay | Admitting: Pharmacist

## 2016-01-27 MED ORDER — BUDESONIDE-FORMOTEROL FUMARATE 160-4.5 MCG/ACT IN AERO: INHALATION_SPRAY | RESPIRATORY_TRACT | 1 refills | Status: DC

## 2016-01-29 DIAGNOSIS — J449 Chronic obstructive pulmonary disease, unspecified: Secondary | ICD-10-CM | POA: Diagnosis not present

## 2016-01-29 DIAGNOSIS — C9101 Acute lymphoblastic leukemia, in remission: Secondary | ICD-10-CM | POA: Diagnosis not present

## 2016-01-29 DIAGNOSIS — D89813 Graft-versus-host disease, unspecified: Secondary | ICD-10-CM | POA: Diagnosis not present

## 2016-01-30 ENCOUNTER — Other Ambulatory Visit: Payer: Self-pay | Admitting: Family Medicine

## 2016-01-30 DIAGNOSIS — R69 Illness, unspecified: Secondary | ICD-10-CM | POA: Diagnosis not present

## 2016-02-03 ENCOUNTER — Other Ambulatory Visit: Payer: Medicare HMO

## 2016-02-04 ENCOUNTER — Other Ambulatory Visit: Payer: Self-pay

## 2016-02-04 MED ORDER — LEVALBUTEROL HCL 1.25 MG/0.5ML IN NEBU: 1.2500 mg | INHALATION_SOLUTION | Freq: Four times a day (QID) | RESPIRATORY_TRACT | 12 refills | Status: DC

## 2016-02-04 MED ORDER — TORSEMIDE 100 MG PO TABS: 200.0000 mg | ORAL_TABLET | Freq: Every day | ORAL | 11 refills | Status: DC

## 2016-02-04 MED ORDER — NEFAZODONE HCL 50 MG PO TABS: 50.0000 mg | ORAL_TABLET | Freq: Every day | ORAL | 0 refills | Status: DC

## 2016-02-04 MED ORDER — AMILORIDE HCL 5 MG PO TABS: 5.0000 mg | ORAL_TABLET | Freq: Every day | ORAL | 0 refills | Status: DC

## 2016-02-04 MED ORDER — SPIRIVA HANDIHALER 18 MCG IN CAPS: ORAL_CAPSULE | RESPIRATORY_TRACT | 0 refills | Status: DC

## 2016-02-04 MED ORDER — SIROLIMUS 0.5 MG PO TABS: 0.5000 mg | ORAL_TABLET | Freq: Every day | ORAL | 0 refills | Status: DC

## 2016-02-04 MED ORDER — PANTOPRAZOLE SODIUM 40 MG PO TBEC: 40.0000 mg | DELAYED_RELEASE_TABLET | Freq: Two times a day (BID) | ORAL | 0 refills | Status: DC

## 2016-02-04 MED ORDER — POTASSIUM CHLORIDE ER 10 MEQ PO CPCR: ORAL_CAPSULE | ORAL | 0 refills | Status: DC

## 2016-02-04 MED ORDER — FOLIC ACID 1 MG PO TABS: 1.0000 mg | ORAL_TABLET | Freq: Every day | ORAL | 0 refills | Status: DC

## 2016-02-04 MED ORDER — FLUTICASONE PROPIONATE 50 MCG/ACT NA SUSP: 1.0000 | Freq: Every day | NASAL | 1 refills | Status: DC

## 2016-02-04 MED ORDER — MONTELUKAST SODIUM 10 MG PO TABS: 10.0000 mg | ORAL_TABLET | Freq: Every day | ORAL | 0 refills | Status: DC

## 2016-02-04 MED ORDER — BUDESONIDE-FORMOTEROL FUMARATE 160-4.5 MCG/ACT IN AERO: INHALATION_SPRAY | RESPIRATORY_TRACT | 1 refills | Status: DC

## 2016-02-04 NOTE — Telephone Encounter (Signed)
Patient would like a 3 month supply of meds. Please advise and send to pharmacy. Patient of Dr Livia Snellen. Just wanted it reviewed because it is so many meds.

## 2016-02-04 NOTE — Telephone Encounter (Signed)
Please advise 

## 2016-02-04 NOTE — Telephone Encounter (Signed)
The sirolimus he needs to get filled by his hematologist. We have also never filled the nefazodone and I dont see it in any prior notes from Dr Livia Snellen.  Can you check with him that he is taking all of the other medications and not just refills requested from pharmacy? Particularly the torsemide, amiloride, and potassium at those doses and frequencies? He sees the hematologist/oncologist often as well and they may changed some medications that we aren't aware of.  If he isnt going to run out in the next week I'll have Dr. Livia Snellen fill the medications when he returns next week.

## 2016-02-08 ENCOUNTER — Ambulatory Visit (INDEPENDENT_AMBULATORY_CARE_PROVIDER_SITE_OTHER): Payer: Medicare HMO | Admitting: Physician Assistant

## 2016-02-08 ENCOUNTER — Encounter: Payer: Self-pay | Admitting: Physician Assistant

## 2016-02-08 ENCOUNTER — Telehealth: Payer: Self-pay | Admitting: *Deleted

## 2016-02-08 VITALS — BP 134/83 | HR 91 | Temp 98.3°F | Ht 66.0 in | Wt 202.0 lb

## 2016-02-08 DIAGNOSIS — J0100 Acute maxillary sinusitis, unspecified: Secondary | ICD-10-CM

## 2016-02-08 DIAGNOSIS — R112 Nausea with vomiting, unspecified: Secondary | ICD-10-CM

## 2016-02-08 MED ORDER — AMOXICILLIN 500 MG PO CAPS: 1000.0000 mg | ORAL_CAPSULE | Freq: Two times a day (BID) | ORAL | 0 refills | Status: DC

## 2016-02-08 NOTE — Progress Notes (Signed)
BP 134/83 (BP Location: Right Arm, Patient Position: Sitting, Cuff Size: Normal)   Pulse 91   Temp 98.3 F (36.8 C) (Oral)   Ht 5\' 6"  (1.676 m)   Wt 202 lb (91.6 kg)   BMI 32.60 kg/m    Subjective:    Patient ID: Derek Shepherd, male    DOB: 1955-08-06, 60 y.o.   MRN: [id number]  HPI: Derek Shepherd is a 60 y.o. male presenting on 02/08/2016 for Emesis (past 3 mornings after eating breakfast. Happens after clearing out sinuses. No vomiting during the day. Decreased appetite. ); Abdominal Pain (LUQ abd pain and bulging); and Fatigue  Patient reports these had significant sinus drainage for several weeks. Every time he has a significant episode where he is cleaning out his sinuses and blowing out a [place] sting causes him to be contacted. He will vomit after he has done this. He denies any fever or chills. He denies any increased ascites going on with his abdomen. He states that when he has vomited a has caused him to feel tight in the upper area of his abdomen. He is status post abdominal wall repair. He has a tremendous amount of scar tissue going on.  Relevant past medical, surgical, family and social history reviewed and updated as indicated. Allergies and medications reviewed and updated.  Past Medical History:  Diagnosis Date  . Adenomatous colon polyp    tubular  . Anemia   . Anxiety   . Arthritis   . Asthma   . Bowel obstruction    constipation  . CHF (congestive heart failure) ([place])   . COPD (chronic obstructive pulmonary disease) ([place])   . Depression   . Diverticulosis   . DVT (deep venous thrombosis) ([place])   . Gallstones   . GERD (gastroesophageal reflux disease)   . 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 History of bone marrow transplant ([place])   . Leukemia-lymphoma, T-cell, acute, HTLV-I-associated ([place])   . Personal history of colonic  adenoma 01/22/2008    Past Surgical History:  Procedure Laterality Date  .  BONE MARROW TRANSPLANT  2011  . CHOLECYSTECTOMY    . COLONOSCOPY W/ BIOPSIES    . EXPLORATORY LAPAROTOMY    . HERNIA REPAIR     x 2  . LUNG SURGERY Left     Review of Systems  Constitutional: Positive for fatigue. Negative for appetite change, chills and fever.  HENT: Positive for congestion, ear pain, nosebleeds, postnasal drip, sinus pressure and sore throat.   Eyes: Negative.  Negative for pain and visual disturbance.  Respiratory: Positive for cough. Negative for chest tightness, shortness of breath and wheezing.   Cardiovascular: Negative.  Negative for chest pain, palpitations and leg swelling.  Gastrointestinal: Positive for vomiting. Negative for abdominal pain, diarrhea and nausea.  Endocrine: Negative.   Genitourinary: Negative.   Musculoskeletal: Positive for back pain and myalgias.  Skin: Negative.  Negative for color change and rash.  Neurological: Positive for headaches. Negative for weakness and numbness.  Psychiatric/Behavioral: Negative.       Medication List       Accurate as of 02/08/16 12:31 PM. Always use your most recent med list.          albuterol (2.5 MG/3ML) 0.083% nebulizer solution Commonly known as:  PROVENTIL Take 3 mLs (2.5 mg total) by nebulization every 6 (six) hours as needed for wheezing or shortness of breath.   aMILoride 5 MG tablet Commonly known  as:  MIDAMOR Take 1 tablet (5 mg total) by mouth daily.   amoxicillin 500 MG capsule Commonly known as:  AMOXIL Take 2 capsules (1,000 mg total) by mouth 2 (two) times daily.   budesonide-formoterol 160-4.5 MCG/ACT inhaler Commonly known as:  SYMBICORT INHALE TWO PUFFS BY MOUTH ONCE DAILY IN THE MORNING   escitalopram 10 MG tablet Commonly known as:  LEXAPRO TAKE ONE TABLET BY MOUTH ONCE DAILY FOR DEPRESSION   fluticasone 50 MCG/ACT nasal spray Commonly known as:  FLONASE Place 1 spray into both nostrils daily.   folic acid 1 MG tablet Commonly known as:  FOLVITE Take 1 tablet (1  mg total) by mouth daily.   lactulose 10 GM/15ML solution Commonly known as:  CHRONULAC Take 30 mLs (20 g total) by mouth 2 (two) times daily. For constipation   levalbuterol 1.25 MG/0.5ML nebulizer solution Commonly known as:  XOPENEX Take 1.25 mg by nebulization 4 (four) times daily.   Magnesium Chloride-Calcium 64-106 MG Tbec Take 2 tablets by mouth daily.   metFORMIN 1000 MG tablet Commonly known as:  GLUCOPHAGE Take 1 tablet (1,000 mg total) by mouth 2 (two) times daily with a meal.   montelukast 10 MG tablet Commonly known as:  SINGULAIR Take 1 tablet (10 mg total) by mouth daily.   Multiple Vitamins tablet Take 1 tablet by mouth daily.   nefazodone 50 MG tablet Commonly known as:  SERZONE Take 1 tablet (50 mg total) by mouth at bedtime.   ONE TOUCH ULTRA TEST test strip Generic drug:  glucose blood   glucose blood test strip Commonly known as:  ONE TOUCH ULTRA TEST Test once daily   OXYGEN Inhale 3-5 L into the lungs continuous. Continuous 5 LPM when up and about 3 LPM constant and at night   pantoprazole 40 MG tablet Commonly known as:  PROTONIX Take 1 tablet (40 mg total) by mouth 2 (two) times daily.   polyethylene glycol powder powder Commonly known as:  GLYCOLAX/MIRALAX Take 17 g by mouth 2 (two) times daily as needed.   potassium chloride 10 MEQ CR capsule Commonly known as:  MICRO-K TAKE THREE CAPSULES BY MOUTH TWICE DAILY   rOPINIRole 1 MG tablet Commonly known as:  REQUIP TAKE 1 TABLET BY MOUTH AT BEDTIME   Sirolimus 0.5 MG Tabs Take 1 tablet (0.5 mg total) by mouth daily.   SPIRIVA HANDIHALER 18 MCG inhalation capsule Generic drug:  tiotropium PLACE THE CONTENTS OF ONE CAPSULE INTO INHALER AND INHALE ONCE DAILY   SYSTANE PRESERVATIVE FREE 0.4-0.3 % Soln Generic drug:  Polyethyl Glycol-Propyl Glycol Place 1 drop into both eyes daily as needed. For dry eyes   torsemide 100 MG tablet Commonly known as:  DEMADEX Take 2 tablets (200 mg  total) by mouth daily.   TRUBIOTICS PO Take 1 capsule by mouth daily.   vitamin B-12 1000 MCG tablet Commonly known as:  CYANOCOBALAMIN Take 1,000 mcg by mouth daily.          Objective:    BP 134/83 (BP Location: Right Arm, Patient Position: Sitting, Cuff Size: Normal)   Pulse 91   Temp 98.3 F (36.8 C) (Oral)   Ht 5\' 6"  (1.676 m)   Wt 202 lb (91.6 kg)   BMI 32.60 kg/m   Allergies  Allergen Reactions  . Nsaids Shortness Of Breath and Swelling    Physical Exam  Constitutional: He is oriented to person, place, and time. He appears well-developed and well-nourished.  HENT:  Head: Normocephalic and  atraumatic.  Right Ear: Tympanic membrane and external ear normal. No middle ear effusion.  Left Ear: Tympanic membrane and external ear normal.  No middle ear effusion.  Nose: Mucosal edema and rhinorrhea present. Right sinus exhibits no maxillary sinus tenderness. Left sinus exhibits no maxillary sinus tenderness.  Mouth/Throat: Uvula is midline. Posterior oropharyngeal erythema present.  Eyes: Conjunctivae and EOM are normal. Pupils are equal, round, and reactive to light. Right eye exhibits no discharge. Left eye exhibits no discharge.  Neck: Normal range of motion.  Cardiovascular: Normal rate, regular rhythm and normal heart sounds.   Pulmonary/Chest: Effort normal and breath sounds normal. No respiratory distress. He has no wheezes.  Abdominal: Soft. He exhibits no ascites and no mass. There is tenderness in the left upper quadrant. There is no rebound and no guarding. No hernia.    Lymphadenopathy:    He has no cervical adenopathy.  Neurological: He is alert and oriented to person, place, and time.  Skin: Skin is warm and dry.  Psychiatric: He has a normal mood and affect.  Nursing note and vitals reviewed.      Assessment & Plan:   1. Acute non-recurrent maxillary sinusitis - amoxicillin (AMOXIL) 500 MG capsule; Take 2 capsules (1,000 mg total) by mouth 2 (two)  times daily.  Dispense: 40 capsule; Refill: 0  2. Non-intractable vomiting with nausea, unspecified vomiting type   Continue all other maintenance medications as listed above.  Follow up plan: Return if symptoms worsen or fail to improve.  Educational handout given for sinusitis  Terald Sleeper PA-C [facility] [address]  [place], [place] [address] [phone]   02/08/2016, 12:31 PM

## 2016-02-08 NOTE — Telephone Encounter (Signed)
TC from [facility] severe reaction between Eliquis & Serzone. Eliquis was last filed on 12/22/15 is the patient still taking this medication. Serzone was refilled last week

## 2016-02-08 NOTE — Patient Instructions (Signed)

## 2016-02-09 NOTE — Telephone Encounter (Signed)
Not likely a problem, but decrease eliquis to 1/2 BID to be safe

## 2016-02-10 DIAGNOSIS — R69 Illness, unspecified: Secondary | ICD-10-CM | POA: Diagnosis not present

## 2016-02-10 NOTE — Telephone Encounter (Signed)
Patient seen today in office 02/10/16

## 2016-02-15 ENCOUNTER — Other Ambulatory Visit: Payer: Self-pay | Admitting: Family Medicine

## 2016-02-19 ENCOUNTER — Encounter: Payer: Self-pay | Admitting: Family Medicine

## 2016-02-19 ENCOUNTER — Ambulatory Visit (INDEPENDENT_AMBULATORY_CARE_PROVIDER_SITE_OTHER): Payer: Medicare HMO | Admitting: Family Medicine

## 2016-02-19 ENCOUNTER — Ambulatory Visit (INDEPENDENT_AMBULATORY_CARE_PROVIDER_SITE_OTHER): Payer: Medicare HMO

## 2016-02-19 ENCOUNTER — Telehealth: Payer: Self-pay | Admitting: Family Medicine

## 2016-02-19 ENCOUNTER — Other Ambulatory Visit: Payer: Self-pay | Admitting: Family Medicine

## 2016-02-19 VITALS — BP 100/68 | HR 118 | Temp 97.5°F | Ht 66.0 in | Wt 206.0 lb

## 2016-02-19 DIAGNOSIS — J441 Chronic obstructive pulmonary disease with (acute) exacerbation: Secondary | ICD-10-CM

## 2016-02-19 DIAGNOSIS — R1084 Generalized abdominal pain: Secondary | ICD-10-CM

## 2016-02-19 DIAGNOSIS — R0602 Shortness of breath: Secondary | ICD-10-CM

## 2016-02-19 DIAGNOSIS — J431 Panlobular emphysema: Secondary | ICD-10-CM

## 2016-02-19 DIAGNOSIS — E119 Type 2 diabetes mellitus without complications: Secondary | ICD-10-CM

## 2016-02-19 DIAGNOSIS — J9611 Chronic respiratory failure with hypoxia: Secondary | ICD-10-CM | POA: Diagnosis not present

## 2016-02-19 LAB — CMP14+EGFR
A/G RATIO: 1.6 (ref 1.2–2.2)
ALBUMIN: 4.1 g/dL (ref 3.5–5.5)
ALT: 24 IU/L (ref 0–44)
AST: 24 IU/L (ref 0–40)
Alkaline Phosphatase: 76 IU/L (ref 39–117)
BUN / CREAT RATIO: 21 — AB (ref 9–20)
BUN: 32 mg/dL — AB (ref 6–24)
CALCIUM: 9.8 mg/dL (ref 8.7–10.2)
CHLORIDE: 89 mmol/L — AB (ref 96–106)
CO2: 28 mmol/L (ref 18–29)
Creatinine, Ser: 1.5 mg/dL — ABNORMAL HIGH (ref 0.76–1.27)
GFR, EST AFRICAN AMERICAN: 58 mL/min/{1.73_m2} — AB (ref >59)
GFR, EST NON AFRICAN AMERICAN: 51 mL/min/{1.73_m2} — AB (ref >59)
Globulin, Total: 2.6 g/dL (ref 1.5–4.5)
Glucose: 122 mg/dL — ABNORMAL HIGH (ref 65–99)
POTASSIUM: 4.7 mmol/L (ref 3.5–5.2)
Sodium: 136 mmol/L (ref 134–144)
TOTAL PROTEIN: 6.7 g/dL (ref 6.0–8.5)

## 2016-02-19 LAB — CBC WITH DIFFERENTIAL/PLATELET
BASOS: 0 %
Basophils Absolute: 0 10*3/uL (ref 0.0–0.2)
EOS (ABSOLUTE): 0.1 10*3/uL (ref 0.0–0.4)
Eos: 1 %
HEMATOCRIT: 27.8 % — AB (ref 37.5–51.0)
HEMOGLOBIN: 9.2 g/dL — AB (ref 13.0–17.7)
IMMATURE GRANS (ABS): 0 10*3/uL (ref 0.0–0.1)
Immature Granulocytes: 1 %
LYMPHS ABS: 1.6 10*3/uL (ref 0.7–3.1)
LYMPHS: 23 %
MCH: 32.9 pg (ref 26.6–33.0)
MCHC: 33.1 g/dL (ref 31.5–35.7)
MCV: 99 fL — AB (ref 79–97)
MONOCYTES: 7 %
Monocytes Absolute: 0.5 10*3/uL (ref 0.1–0.9)
NEUTROS ABS: 4.7 10*3/uL (ref 1.4–7.0)
Neutrophils: 68 %
Platelets: 127 10*3/uL — ABNORMAL LOW (ref 150–379)
RBC: 2.8 x10E6/uL — ABNORMAL LOW (ref 4.14–5.80)
RDW: 17.1 % — ABNORMAL HIGH (ref 12.3–15.4)
WBC: 7 10*3/uL (ref 3.4–10.8)

## 2016-02-19 LAB — FINGERSTICK HEMOGLOBIN: Hemoglobin: 10.4 g/dL — ABNORMAL LOW (ref 12.6–17.7)

## 2016-02-19 MED ORDER — PREDNISONE 10 MG PO TABS: ORAL_TABLET | ORAL | 0 refills | Status: DC

## 2016-02-19 NOTE — Telephone Encounter (Signed)
Call in sterapred 10mg  6 day dose pack #1 take as directed.

## 2016-02-19 NOTE — Telephone Encounter (Signed)
Patient aware Dr. Livia Snellen sent to pharmacy

## 2016-02-19 NOTE — Addendum Note (Signed)
Addended by: Claretta Fraise on: 02/19/2016 06:29 PM   Modules accepted: Orders

## 2016-02-19 NOTE — Progress Notes (Addendum)
Subjective:  Patient ID: ADEEL GUIFFRE, male    DOB: 1955-09-21  Age: 60 y.o. MRN: [id number]  CC: 6 week recheck (pt here today for recheck on his anemia, he has been feeling very weak and SOB, his O2 levels dropped from 97 to 82 just from walking from the waiting room to the exam room)   HPI JEFFERY BACHMEIER presents for Increasing dyspnea. Symptoms as above. He can't walk around the house even now without feeling short of breath. He increases his oxygen to 5 L for ambulation and decreases it to 3 L when resting. His shortness of breath is tolerable when resting but with even minimal exertion he becomes quite dyspneic. No chest pain. This seems to been increasing over the last 7-10 days. He also feels bloated in the abdomen. He feels like he can't get a deep breath.   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   He has a past surgical history that includes Cholecystectomy; Lung surgery (Left); Exploratory laparotomy; Hernia repair; Colonoscopy w/ biopsies; and Bone marrow transplant (2011).   His family history includes Clotting disorder in his mother; Diabetes in his brother, father, maternal aunt, and sister; Heart attack in his father and mother; Prostate cancer in his brother.He reports that he quit smoking about 22 years ago. His smoking use included Cigarettes. He has never used smokeless tobacco. He reports that he drinks alcohol. He reports that he does not use  drugs.  Allergies as of 02/19/2016      Reactions   Nsaids Shortness Of Breath, Swelling      Medication List       Accurate as of 02/19/16  6:29 PM. Always use your most recent med list.          albuterol (2.5 MG/3ML) 0.083% nebulizer solution Commonly known as:  PROVENTIL Take 3 mLs (2.5 mg total) by nebulization every 6 (six) hours as needed for wheezing or shortness of breath.   aMILoride 5 MG tablet Commonly known as:  MIDAMOR Take 1 tablet (5 mg total) by mouth daily.   amoxicillin 500 MG capsule Commonly known as:  AMOXIL Take 2 capsules (1,000 mg total) by mouth 2 (two) times daily.   budesonide-formoterol 160-4.5 MCG/ACT inhaler Commonly known as:  SYMBICORT INHALE TWO PUFFS BY MOUTH ONCE DAILY IN THE MORNING   escitalopram 10 MG tablet Commonly known as:  LEXAPRO TAKE ONE TABLET BY MOUTH ONCE DAILY FOR DEPRESSION   fluticasone 50 MCG/ACT nasal spray Commonly known as:  FLONASE Place 1 spray into both nostrils daily.   folic acid 1 MG tablet Commonly known as:  FOLVITE Take 1 tablet (1 mg total) by mouth daily.   lactulose 10 GM/15ML solution Commonly known as:  CHRONULAC Take 30 mLs (20 g total) by mouth 2 (two) times daily. For constipation   levalbuterol 1.25 MG/0.5ML nebulizer solution Commonly known as:  XOPENEX Take 1.25 mg by nebulization 4 (four) times daily.   Magnesium Chloride-Calcium 64-106 MG Tbec Take 2 tablets by mouth daily.   metFORMIN 1000 MG tablet Commonly known as:  GLUCOPHAGE Take 1 tablet (1,000 mg total) by mouth 2 (  two) times daily with a meal.   montelukast 10 MG tablet Commonly known as:  SINGULAIR Take 1 tablet (10 mg total) by mouth daily.   Multiple Vitamins tablet Take 1 tablet by mouth daily.   nefazodone 50 MG tablet Commonly known as:  SERZONE Take 1 tablet (50 mg total) by mouth at bedtime.   ONE TOUCH ULTRA TEST test strip Generic drug:  glucose blood   glucose blood test strip Commonly known as:   ONE TOUCH ULTRA TEST Test once daily   OXYGEN Inhale 3-5 L into the lungs continuous. Continuous 5 LPM when up and about 3 LPM constant and at night   pantoprazole 40 MG tablet Commonly known as:  PROTONIX Take 1 tablet (40 mg total) by mouth 2 (two) times daily.   polyethylene glycol powder powder Commonly known as:  GLYCOLAX/MIRALAX TAKE 17 GRAMS BY MOUTH 2 TIMES DAILY AS NEEDED   potassium chloride 10 MEQ CR capsule Commonly known as:  MICRO-K TAKE THREE CAPSULES BY MOUTH TWICE DAILY   predniSONE 10 MG tablet Commonly known as:  DELTASONE Take 5 daily for 3 days followed by 4,3,2 and 1 for 3 days each.   rOPINIRole 1 MG tablet Commonly known as:  REQUIP TAKE 1 TABLET BY MOUTH AT BEDTIME   Sirolimus 0.5 MG Tabs Take 1 tablet (0.5 mg total) by mouth daily.   SPIRIVA HANDIHALER 18 MCG inhalation capsule Generic drug:  tiotropium PLACE THE CONTENTS OF ONE CAPSULE INTO INHALER AND INHALE ONCE DAILY   SYSTANE PRESERVATIVE FREE 0.4-0.3 % Soln Generic drug:  Polyethyl Glycol-Propyl Glycol Place 1 drop into both eyes daily as needed. For dry eyes   torsemide 100 MG tablet Commonly known as:  DEMADEX Take 2 tablets (200 mg total) by mouth daily.   TRUBIOTICS PO Take 1 capsule by mouth daily.   vitamin B-12 1000 MCG tablet Commonly known as:  CYANOCOBALAMIN Take 1,000 mcg by mouth daily.        ROS Review of Systems  Constitutional: Negative for chills, diaphoresis, fever and unexpected weight change.  HENT: Negative for rhinorrhea and trouble swallowing.   Respiratory: Positive for shortness of breath. Negative for cough and chest tightness.   Cardiovascular: Negative for chest pain.  Gastrointestinal: Positive for abdominal distention and abdominal pain. Negative for blood in stool, diarrhea, nausea, rectal pain and vomiting.  Genitourinary: Negative for dysuria, flank pain and hematuria.  Musculoskeletal: Negative for arthralgias and joint swelling.  Skin:  Negative for rash.  Neurological: Negative for syncope and headaches.    Objective:  BP 100/68   Pulse (!) 118   Temp 97.5 F (36.4 C) (Oral)   Ht 5' 6"  (1.676 m)   Wt 206 lb (93.4 kg)   SpO2 95%   BMI 33.25 kg/m   BP Readings from Last 3 Encounters:  02/19/16 100/68  02/08/16 134/83  01/06/16 136/81    Wt Readings from Last 3 Encounters:  02/19/16 206 lb (93.4 kg)  02/08/16 202 lb (91.6 kg)  01/06/16 204 lb (92.5 kg)     Physical Exam  Constitutional: He is oriented to person, place, and time. He appears well-developed and well-nourished. No distress.  HENT:  Head: Normocephalic and atraumatic.  Right Ear: External ear normal.  Left Ear: External ear normal.  Nose: Nose normal.  Mouth/Throat: Oropharynx is clear and moist.  Eyes: Conjunctivae and EOM are normal. Pupils are equal, round, and reactive to light.  Neck: Normal range of motion. Neck supple. No thyromegaly present.  Cardiovascular: Normal rate, regular rhythm and normal heart sounds.   No murmur heard. Pulmonary/Chest: Effort normal and breath sounds normal. No respiratory distress. He has no wheezes. He has no rales.  Abdominal: Soft. Bowel sounds are normal. He exhibits distension. He exhibits no mass. There is tenderness. There is no rebound and no guarding.  Lymphadenopathy:    He has no cervical adenopathy.  Neurological: He is alert and oriented to person, place, and time. He has normal reflexes.  Skin: Skin is warm and dry.  Psychiatric: He has a normal mood and affect. His behavior is normal. Thought content normal.     Lab Results  Component Value Date   WBC 5.0 12/31/2015   HGB 8.9 (L) 01/04/2016   HCT 27.2 (L) 01/04/2016   PLT 53 (LL) 12/31/2015   GLUCOSE 228 (H) 12/22/2015   CHOL 328 (H) 12/22/2015   TRIG 459 (H) 12/22/2015   HDL 64 12/22/2015   LDLCALC Comment 12/22/2015   ALT 35 12/22/2015   AST 25 12/22/2015   NA 138 12/22/2015   K 3.2 (L) 12/22/2015   CL 86 (L) 12/22/2015    CREATININE 0.94 12/22/2015   BUN 20 12/22/2015   CO2 34 (H) 12/22/2015   TSH 4.070 12/22/2015   PSA 1.0 02/15/2013   INR 1.00 11/24/2015   HGBA1C 5.6 12/27/2014   MICROALBUR negq 10/31/2013    No results found.  Assessment & Plan:   Shiquan was seen today for 6 week recheck.  Diagnoses and all orders for this visit:  COPD exacerbation ([place]) -     CMP14+EGFR -     CBC with Differential/Platelet -     Cancel: DG Abd Acute W/Chest; Future  SOB (shortness of breath) -     CMP14+EGFR -     CBC with Differential/Platelet -     Brain natriuretic peptide -     D-dimer, quantitative (not at [facility]) -     Cancel: DG Abd Acute W/Chest; Future -     Fingerstick Hemoglobin  Controlled type 2 diabetes mellitus without complication, without long-term current use of insulin (HCC) -     CMP14+EGFR -     CBC with Differential/Platelet  Panlobular emphysema (HCC) -     CMP14+EGFR -     CBC with Differential/Platelet -     Cancel: DG Abd Acute W/Chest; Future  Chronic respiratory failure with hypoxia (HCC)  Generalized abdominal pain  Other orders -     predniSONE (DELTASONE) 10 MG tablet; Take 5 daily for 3 days followed by 4,3,2 and 1 for 3 days each.    X-ray shows lungs are clear, COPD is present. Abdomen shows some differential air-fluid levels but insufficient for obstruction. Wet read by radiology. Large amount of stool noted.  I am having Mr. Harrow start on predniSONE. I am also having him maintain his Multiple Vitamins, Polyethyl Glycol-Propyl Glycol, vitamin B-12, albuterol, Magnesium Chloride-Calcium, OXYGEN, Probiotic Product (TRUBIOTICS PO), lactulose, rOPINIRole, ONE TOUCH ULTRA TEST, glucose blood, metFORMIN, escitalopram, aMILoride, budesonide-formoterol, fluticasone, folic acid, levalbuterol, montelukast, nefazodone, pantoprazole, potassium chloride, Sirolimus, SPIRIVA HANDIHALER, torsemide, amoxicillin, and polyethylene glycol powder.  Meds ordered this encounter   Medications  . predniSONE (DELTASONE) 10 MG tablet    Sig: Take 5 daily for 3 days followed by 4,3,2 and 1 for 3 days each.    Dispense:  45 tablet    Refill:  0     Follow-up: Return in about 1 month (around 03/21/2016).  Claretta Fraise, M.D.

## 2016-02-20 LAB — D-DIMER, QUANTITATIVE (NOT AT ARMC): D-DIMER: 2.46 mg{FEU}/L — AB (ref 0.00–0.49)

## 2016-02-20 LAB — BRAIN NATRIURETIC PEPTIDE: BNP: 25.6 pg/mL (ref 0.0–100.0)

## 2016-02-22 ENCOUNTER — Telehealth: Payer: Self-pay | Admitting: Family Medicine

## 2016-02-22 DIAGNOSIS — I2782 Chronic pulmonary embolism: Secondary | ICD-10-CM

## 2016-02-22 NOTE — Telephone Encounter (Signed)
Pt aware of results & CT to be ordered

## 2016-02-23 DIAGNOSIS — D801 Nonfamilial hypogammaglobulinemia: Secondary | ICD-10-CM | POA: Diagnosis not present

## 2016-02-23 DIAGNOSIS — C9101 Acute lymphoblastic leukemia, in remission: Secondary | ICD-10-CM | POA: Diagnosis not present

## 2016-02-23 DIAGNOSIS — Z9484 Stem cells transplant status: Secondary | ICD-10-CM | POA: Diagnosis not present

## 2016-02-25 ENCOUNTER — Ambulatory Visit (HOSPITAL_COMMUNITY)
Admission: RE | Admit: 2016-02-25 | Discharge: 2016-02-25 | Disposition: A | Discharge status: Home / Self Care | Payer: Medicare HMO | Source: Ambulatory Visit | Attending: Family Medicine | Admitting: Family Medicine

## 2016-02-25 DIAGNOSIS — I700 Atherosclerosis of aorta: Secondary | ICD-10-CM | POA: Insufficient documentation

## 2016-02-25 DIAGNOSIS — I2782 Chronic pulmonary embolism: Secondary | ICD-10-CM | POA: Diagnosis present

## 2016-02-25 DIAGNOSIS — R0602 Shortness of breath: Secondary | ICD-10-CM | POA: Diagnosis not present

## 2016-02-25 MED ORDER — IOPAMIDOL (ISOVUE-370) INJECTION 76%
100.0000 mL | Freq: Once | INTRAVENOUS | Status: AC | PRN
  Administered 2016-02-25: 100 mL via INTRAVENOUS

## 2016-02-25 MED ORDER — IOPAMIDOL (ISOVUE-300) INJECTION 61%: 100.0000 mL | Freq: Once | INTRAVENOUS | Status: DC | PRN

## 2016-03-02 ENCOUNTER — Ambulatory Visit: Payer: Medicare HMO | Admitting: Pediatrics

## 2016-03-02 ENCOUNTER — Telehealth: Payer: Self-pay | Admitting: Family Medicine

## 2016-03-03 NOTE — Telephone Encounter (Signed)
Pt c/o continued weakness HGB still low Instructed pt to contact hematologist

## 2016-03-04 ENCOUNTER — Encounter (HOSPITAL_COMMUNITY): Payer: Self-pay | Admitting: Emergency Medicine

## 2016-03-04 ENCOUNTER — Emergency Department (HOSPITAL_COMMUNITY): Payer: Medicare HMO

## 2016-03-04 ENCOUNTER — Observation Stay (HOSPITAL_COMMUNITY)
Admission: EM | Admit: 2016-03-04 | Discharge: 2016-03-06 | Disposition: A | Discharge status: Home / Self Care | Payer: Medicare HMO | Attending: Internal Medicine | Admitting: Internal Medicine

## 2016-03-04 DIAGNOSIS — N179 Acute kidney failure, unspecified: Secondary | ICD-10-CM | POA: Diagnosis not present

## 2016-03-04 DIAGNOSIS — E860 Dehydration: Secondary | ICD-10-CM | POA: Diagnosis not present

## 2016-03-04 DIAGNOSIS — D638 Anemia in other chronic diseases classified elsewhere: Secondary | ICD-10-CM | POA: Diagnosis not present

## 2016-03-04 DIAGNOSIS — I509 Heart failure, unspecified: Secondary | ICD-10-CM | POA: Diagnosis not present

## 2016-03-04 DIAGNOSIS — I5032 Chronic diastolic (congestive) heart failure: Secondary | ICD-10-CM | POA: Diagnosis present

## 2016-03-04 DIAGNOSIS — E871 Hypo-osmolality and hyponatremia: Secondary | ICD-10-CM | POA: Diagnosis present

## 2016-03-04 DIAGNOSIS — C9101 Acute lymphoblastic leukemia, in remission: Secondary | ICD-10-CM | POA: Diagnosis not present

## 2016-03-04 DIAGNOSIS — E872 Acidosis, unspecified: Secondary | ICD-10-CM

## 2016-03-04 DIAGNOSIS — I110 Hypertensive heart disease with heart failure: Secondary | ICD-10-CM | POA: Diagnosis not present

## 2016-03-04 DIAGNOSIS — F329 Major depressive disorder, single episode, unspecified: Secondary | ICD-10-CM

## 2016-03-04 DIAGNOSIS — J45909 Unspecified asthma, uncomplicated: Secondary | ICD-10-CM | POA: Insufficient documentation

## 2016-03-04 DIAGNOSIS — J431 Panlobular emphysema: Secondary | ICD-10-CM | POA: Diagnosis present

## 2016-03-04 DIAGNOSIS — Z79899 Other long term (current) drug therapy: Secondary | ICD-10-CM | POA: Diagnosis not present

## 2016-03-04 DIAGNOSIS — Z87891 Personal history of nicotine dependence: Secondary | ICD-10-CM | POA: Diagnosis not present

## 2016-03-04 DIAGNOSIS — F32A Depression, unspecified: Secondary | ICD-10-CM | POA: Diagnosis present

## 2016-03-04 DIAGNOSIS — D696 Thrombocytopenia, unspecified: Secondary | ICD-10-CM | POA: Diagnosis present

## 2016-03-04 DIAGNOSIS — R0602 Shortness of breath: Secondary | ICD-10-CM | POA: Diagnosis not present

## 2016-03-04 DIAGNOSIS — D72829 Elevated white blood cell count, unspecified: Secondary | ICD-10-CM | POA: Diagnosis present

## 2016-03-04 DIAGNOSIS — I959 Hypotension, unspecified: Secondary | ICD-10-CM | POA: Diagnosis not present

## 2016-03-04 DIAGNOSIS — E119 Type 2 diabetes mellitus without complications: Secondary | ICD-10-CM

## 2016-03-04 DIAGNOSIS — R531 Weakness: Secondary | ICD-10-CM | POA: Diagnosis present

## 2016-03-04 LAB — BASIC METABOLIC PANEL
Anion gap: 9 (ref 5–15)
BUN: 59 mg/dL — ABNORMAL HIGH (ref 6–20)
CALCIUM: 10.6 mg/dL — AB (ref 8.9–10.3)
CO2: 32 mmol/L (ref 22–32)
CREATININE: 2.15 mg/dL — AB (ref 0.61–1.24)
Chloride: 88 mmol/L — ABNORMAL LOW (ref 101–111)
GFR, EST AFRICAN AMERICAN: 37 mL/min — AB (ref >60)
GFR, EST NON AFRICAN AMERICAN: 32 mL/min — AB (ref >60)
GLUCOSE: 192 mg/dL — AB (ref 65–99)
Potassium: 4.8 mmol/L (ref 3.5–5.1)
Sodium: 129 mmol/L — ABNORMAL LOW (ref 135–145)

## 2016-03-04 LAB — URINALYSIS, ROUTINE W REFLEX MICROSCOPIC
BILIRUBIN URINE: NEGATIVE
Glucose, UA: NEGATIVE mg/dL
HGB URINE DIPSTICK: NEGATIVE
Ketones, ur: NEGATIVE mg/dL
Leukocytes, UA: NEGATIVE
Nitrite: NEGATIVE
PROTEIN: NEGATIVE mg/dL
Specific Gravity, Urine: 1.015 (ref 1.005–1.030)
pH: 5.5 (ref 5.0–8.0)

## 2016-03-04 LAB — CBC
HCT: 31.1 % — ABNORMAL LOW (ref 39.0–52.0)
Hemoglobin: 10.1 g/dL — ABNORMAL LOW (ref 13.0–17.0)
MCH: 33.6 pg (ref 26.0–34.0)
MCHC: 32.5 g/dL (ref 30.0–36.0)
MCV: 103.3 fL — ABNORMAL HIGH (ref 78.0–100.0)
PLATELETS: 110 10*3/uL — AB (ref 150–400)
RBC: 3.01 MIL/uL — AB (ref 4.22–5.81)
RDW: 17.8 % — ABNORMAL HIGH (ref 11.5–15.5)
WBC: 12.8 10*3/uL — ABNORMAL HIGH (ref 4.0–10.5)

## 2016-03-04 LAB — HEPATIC FUNCTION PANEL
ALT: 34 U/L (ref 17–63)
AST: 31 U/L (ref 15–41)
Albumin: 4.4 g/dL (ref 3.5–5.0)
Alkaline Phosphatase: 45 U/L (ref 38–126)
BILIRUBIN TOTAL: 0.6 mg/dL (ref 0.3–1.2)
Total Protein: 8 g/dL (ref 6.5–8.1)

## 2016-03-04 LAB — I-STAT CG4 LACTIC ACID, ED: Lactic Acid, Venous: 3.53 mmol/L (ref 0.5–1.9)

## 2016-03-04 LAB — GLUCOSE, CAPILLARY: Glucose-Capillary: 195 mg/dL — ABNORMAL HIGH (ref 65–99)

## 2016-03-04 LAB — TYPE AND SCREEN
ABO/RH(D): A POS
Antibody Screen: NEGATIVE

## 2016-03-04 LAB — TROPONIN I
TROPONIN I: 0.03 ng/mL — AB (ref <0.03)
Troponin I: <0.03 ng/mL (ref <0.03)

## 2016-03-04 LAB — TSH: TSH: 3.672 u[IU]/mL (ref 0.350–4.500)

## 2016-03-04 LAB — CBG MONITORING, ED: GLUCOSE-CAPILLARY: 116 mg/dL — AB (ref 65–99)

## 2016-03-04 LAB — LACTIC ACID, PLASMA: Lactic Acid, Venous: 2.5 mmol/L (ref 0.5–1.9)

## 2016-03-04 LAB — BRAIN NATRIURETIC PEPTIDE: B Natriuretic Peptide: 49 pg/mL (ref 0.0–100.0)

## 2016-03-04 MED ORDER — ACETAMINOPHEN 650 MG RE SUPP: 650.0000 mg | Freq: Four times a day (QID) | RECTAL | Status: DC | PRN

## 2016-03-04 MED ORDER — INSULIN ASPART 100 UNIT/ML ~~LOC~~ SOLN: 0.0000 [IU] | Freq: Every day | SUBCUTANEOUS | Status: DC

## 2016-03-04 MED ORDER — SODIUM CHLORIDE 0.9 % IV BOLUS (SEPSIS)
1000.0000 mL | Freq: Once | INTRAVENOUS | Status: AC
Start: 2016-03-04 — End: 2016-03-04
  Administered 2016-03-04: 1000 mL via INTRAVENOUS

## 2016-03-04 MED ORDER — ONDANSETRON HCL 4 MG/2ML IJ SOLN: 4.0000 mg | Freq: Four times a day (QID) | INTRAMUSCULAR | Status: DC | PRN

## 2016-03-04 MED ORDER — TIOTROPIUM BROMIDE MONOHYDRATE 18 MCG IN CAPS
18.0000 ug | ORAL_CAPSULE | Freq: Every day | RESPIRATORY_TRACT | Status: DC
  Filled 2016-03-04: qty 5

## 2016-03-04 MED ORDER — ADULT MULTIVITAMIN W/MINERALS CH
1.0000 | ORAL_TABLET | Freq: Every day | ORAL | Status: DC
  Administered 2016-03-05 – 2016-03-06 (×2): 1 via ORAL
  Filled 2016-03-04 (×2): qty 1

## 2016-03-04 MED ORDER — NEFAZODONE HCL 100 MG PO TABS
ORAL_TABLET | ORAL | Status: AC
  Filled 2016-03-04: qty 1

## 2016-03-04 MED ORDER — VITAMIN B-12 1000 MCG PO TABS
1000.0000 ug | ORAL_TABLET | Freq: Every day | ORAL | Status: DC
  Administered 2016-03-05 – 2016-03-06 (×2): 1000 ug via ORAL
  Filled 2016-03-04 (×2): qty 1

## 2016-03-04 MED ORDER — FLUTICASONE PROPIONATE 50 MCG/ACT NA SUSP
1.0000 | Freq: Every day | NASAL | Status: DC
  Administered 2016-03-05 – 2016-03-06 (×2): 1 via NASAL
  Filled 2016-03-04: qty 16

## 2016-03-04 MED ORDER — MONTELUKAST SODIUM 10 MG PO TABS
10.0000 mg | ORAL_TABLET | Freq: Every day | ORAL | Status: DC
  Administered 2016-03-05 – 2016-03-06 (×2): 10 mg via ORAL
  Filled 2016-03-04 (×2): qty 1

## 2016-03-04 MED ORDER — LACTULOSE 10 GM/15ML PO SOLN: 20.0000 g | Freq: Two times a day (BID) | ORAL | Status: DC | PRN

## 2016-03-04 MED ORDER — ONDANSETRON HCL 4 MG PO TABS: 4.0000 mg | ORAL_TABLET | Freq: Four times a day (QID) | ORAL | Status: DC | PRN

## 2016-03-04 MED ORDER — ESCITALOPRAM OXALATE 10 MG PO TABS
10.0000 mg | ORAL_TABLET | Freq: Every day | ORAL | Status: DC
  Administered 2016-03-05 – 2016-03-06 (×2): 10 mg via ORAL
  Filled 2016-03-04 (×2): qty 1

## 2016-03-04 MED ORDER — ACETAMINOPHEN 325 MG PO TABS: 650.0000 mg | ORAL_TABLET | Freq: Four times a day (QID) | ORAL | Status: DC | PRN

## 2016-03-04 MED ORDER — SODIUM CHLORIDE 0.9% FLUSH
3.0000 mL | Freq: Two times a day (BID) | INTRAVENOUS | Status: DC
  Administered 2016-03-04 – 2016-03-06 (×4): 3 mL via INTRAVENOUS

## 2016-03-04 MED ORDER — LEVALBUTEROL HCL 1.25 MG/0.5ML IN NEBU: 1.2500 mg | INHALATION_SOLUTION | Freq: Four times a day (QID) | RESPIRATORY_TRACT | Status: DC | PRN

## 2016-03-04 MED ORDER — RISAQUAD PO CAPS
1.0000 | ORAL_CAPSULE | Freq: Every day | ORAL | Status: DC
  Administered 2016-03-05 – 2016-03-06 (×2): 1 via ORAL
  Filled 2016-03-04 (×2): qty 1

## 2016-03-04 MED ORDER — INSULIN ASPART 100 UNIT/ML ~~LOC~~ SOLN
0.0000 [IU] | Freq: Three times a day (TID) | SUBCUTANEOUS | Status: DC
  Administered 2016-03-05 (×2): 2 [IU] via SUBCUTANEOUS
  Administered 2016-03-06: 3 [IU] via SUBCUTANEOUS

## 2016-03-04 MED ORDER — ROPINIROLE HCL 1 MG PO TABS
1.0000 mg | ORAL_TABLET | Freq: Every day | ORAL | Status: DC
  Administered 2016-03-04 – 2016-03-05 (×2): 1 mg via ORAL
  Filled 2016-03-04 (×2): qty 1

## 2016-03-04 MED ORDER — HYDROCODONE-ACETAMINOPHEN 5-325 MG PO TABS: 1.0000 | ORAL_TABLET | ORAL | Status: DC | PRN

## 2016-03-04 MED ORDER — POLYETHYL GLYCOL-PROPYL GLYCOL 0.4-0.3 % OP SOLN: 1.0000 [drp] | OPHTHALMIC | Status: DC | PRN

## 2016-03-04 MED ORDER — MOMETASONE FURO-FORMOTEROL FUM 200-5 MCG/ACT IN AERO
2.0000 | INHALATION_SPRAY | Freq: Two times a day (BID) | RESPIRATORY_TRACT | Status: DC
  Administered 2016-03-05 – 2016-03-06 (×2): 2 via RESPIRATORY_TRACT
  Filled 2016-03-04: qty 8.8

## 2016-03-04 MED ORDER — HEPARIN SODIUM (PORCINE) 5000 UNIT/ML IJ SOLN
5000.0000 [IU] | Freq: Three times a day (TID) | INTRAMUSCULAR | Status: DC
  Administered 2016-03-04 – 2016-03-06 (×5): 5000 [IU] via SUBCUTANEOUS
  Filled 2016-03-04 (×5): qty 1

## 2016-03-04 MED ORDER — SODIUM CHLORIDE 0.9 % IV SOLN
INTRAVENOUS | Status: DC
  Administered 2016-03-04: 23:00:00 via INTRAVENOUS

## 2016-03-04 MED ORDER — MOMETASONE FURO-FORMOTEROL FUM 200-5 MCG/ACT IN AERO
INHALATION_SPRAY | RESPIRATORY_TRACT | Status: AC
  Filled 2016-03-04: qty 8.8

## 2016-03-04 MED ORDER — SODIUM CHLORIDE 0.9 % IV BOLUS (SEPSIS)
500.0000 mL | Freq: Once | INTRAVENOUS | Status: AC
  Administered 2016-03-04: 500 mL via INTRAVENOUS

## 2016-03-04 MED ORDER — POLYVINYL ALCOHOL 1.4 % OP SOLN: 1.0000 [drp] | OPHTHALMIC | Status: DC | PRN

## 2016-03-04 MED ORDER — PANTOPRAZOLE SODIUM 40 MG PO TBEC
40.0000 mg | DELAYED_RELEASE_TABLET | Freq: Two times a day (BID) | ORAL | Status: DC
  Administered 2016-03-04 – 2016-03-06 (×4): 40 mg via ORAL
  Filled 2016-03-04 (×4): qty 1

## 2016-03-04 MED ORDER — FOLIC ACID 1 MG PO TABS
1.0000 mg | ORAL_TABLET | Freq: Every day | ORAL | Status: DC
  Administered 2016-03-05 – 2016-03-06 (×2): 1 mg via ORAL
  Filled 2016-03-04 (×2): qty 1

## 2016-03-04 MED ORDER — NEFAZODONE HCL 100 MG PO TABS
50.0000 mg | ORAL_TABLET | Freq: Every day | ORAL | Status: DC
  Administered 2016-03-05: 50 mg via ORAL
  Filled 2016-03-04 (×3): qty 1

## 2016-03-04 NOTE — ED Triage Notes (Signed)
Pt has leukemia, and was seen at [facility] a couple days ago and hgb dropped from 12 a few weeks ago and hgb is now 8.  Pt had blood transfusion a few weeks ago. Pt reports sob and weakness today, feeling jittery. Pt had near syncopal episode on Wednesday, no falls. Pt c/o h/a also.

## 2016-03-04 NOTE — ED Provider Notes (Signed)
[facility] DEPT Provider Note   CSN: [id number] Arrival date & time: 03/04/16  1202   By signing my name below, I, Eunice Blase, attest that this documentation has been prepared under the direction and in the presence of Julianne Rice, MD. Electronically signed, Eunice Blase, ED Scribe. 03/04/16. 1:48 PM.   History   Chief Complaint Chief Complaint  Patient presents with  . Weakness   The history is provided by the patient and medical records. No language interpreter was used.    HPI Comments: Derek Shepherd is a 60 y.o. male with Hx of leukemia who presents to the Emergency Department complaining of progressively worsening, recurrent SOB x multiple months. Shortness breath is worse with exertion. Also notes lightheadedness and episodes of near syncope upon standing. He denies any chest pain, blood in stool or urine. No recent fever or chills. He reports he is on 3 L O2 at home and multiple medications. Further denies use of tobacco, ETOH or illicit drugs.  Past Medical History:  Diagnosis Date  . Adenomatous colon polyp    tubular  . Anemia   . Anxiety   . Arthritis   . Asthma   . Bowel obstruction    constipation  . CHF (congestive heart failure) ([place])   . COPD (chronic obstructive pulmonary disease) ([place])   . Depression   . Diverticulosis   . DVT (deep venous thrombosis) ([place])   . Gallstones   . GERD (gastroesophageal reflux disease)   . 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 History of bone marrow transplant ([place])   . 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 Personal history of colonic  adenoma 01/22/2008    Patient Active Problem List   Diagnosis Date Noted  . AKI (acute kidney injury) ([place]) 03/04/2016  . Hyponatremia 03/04/2016  . Hypotension 03/04/2016  . Panlobular emphysema ([place]) 12/22/2015  . Leukocytosis 11/24/2015  . Anemia of chronic disease 11/24/2015  . Chronic pulmonary  embolism ([place]) 11/24/2015  . Thrombocytopenia ([place]) 11/24/2015  . Depression 11/24/2015  . Chronic combined systolic and diastolic CHF (congestive heart failure) ([place]) 11/24/2015  . Controlled type 2 diabetes mellitus without complication, without long-term current use of insulin ([place]) 11/24/2015  . History of bone marrow transplant ([place])   . ALL (acute lymphoid leukemia) in remission ([place]) 03/20/2013    Past Surgical History:  Procedure Laterality Date  . BONE MARROW TRANSPLANT  2011  . CHOLECYSTECTOMY    . COLONOSCOPY W/ BIOPSIES    . EXPLORATORY LAPAROTOMY    . HERNIA REPAIR     x 2  . LUNG SURGERY Left        Home Medications    Prior to Admission medications   Medication Sig Start Date End Date Taking? Authorizing Provider  acidophilus (RISAQUAD) CAPS capsule Take 1 capsule by mouth daily.   Yes Historical Provider, MD  albuterol (PROVENTIL) (2.5 MG/3ML) 0.083% nebulizer solution Take 3 mLs (2.5 mg total) by nebulization every 6 (six) hours as needed for wheezing or shortness of breath. 05/24/14  Yes Claretta Fraise, MD  aMILoride (MIDAMOR) 5 MG tablet Take 1 tablet (5 mg total) by mouth daily. 02/04/16  Yes Claretta Fraise, MD  budesonide-formoterol [facility]) 160-4.5 MCG/ACT inhaler Inhale 2 puffs into the lungs daily.   Yes Historical Provider, MD  escitalopram (LEXAPRO) 10 MG tablet Take 10 mg by mouth daily.   Yes Historical Provider, MD  fluticasone (FLONASE) 50 MCG/ACT nasal spray Place 1 spray into  both nostrils daily. 02/04/16  Yes Claretta Fraise, MD  folic acid (FOLVITE) 1 MG tablet Take 1 tablet (1 mg total) by mouth daily. 02/04/16  Yes Claretta Fraise, MD  lactulose (CHRONULAC) 10 GM/15ML solution Take 20 g by mouth 2 (two) times daily as needed for mild constipation.   Yes Historical Provider, MD  levalbuterol (XOPENEX) 1.25 MG/0.5ML nebulizer solution Take 1.25 mg by nebulization every 6 (six) hours as needed for wheezing or shortness of breath.   Yes Historical  Provider, MD  metFORMIN (GLUCOPHAGE) 1000 MG tablet Take 1 tablet (1,000 mg total) by mouth 2 (two) times daily with a meal. 12/29/15  Yes Tammy Eckard, PharmD  montelukast (SINGULAIR) 10 MG tablet Take 1 tablet (10 mg total) by mouth daily. 02/04/16  Yes Claretta Fraise, MD  Multiple Vitamin (MULTIVITAMIN WITH MINERALS) TABS tablet Take 1 tablet by mouth daily.   Yes Historical Provider, MD  nefazodone (SERZONE) 50 MG tablet Take 1 tablet (50 mg total) by mouth at bedtime. 02/04/16  Yes Claretta Fraise, MD  pantoprazole (PROTONIX) 40 MG tablet Take 1 tablet (40 mg total) by mouth 2 (two) times daily. 02/04/16 02/03/17 Yes Claretta Fraise, MD  Polyethyl Glycol-Propyl Glycol (SYSTANE) 0.4-0.3 % SOLN Place 1 drop into both eyes as needed (for dry eyes).   Yes Historical Provider, MD  potassium chloride (MICRO-K) 10 MEQ CR capsule Take 30 mEq by mouth 2 (two) times daily.   Yes Historical Provider, MD  rOPINIRole (REQUIP) 1 MG tablet Take 1 mg by mouth at bedtime.   Yes Historical Provider, MD  senna (SENOKOT) 8.6 MG tablet Take 2 tablets by mouth daily as needed for constipation.   Yes Historical Provider, MD  Sirolimus 0.5 MG TABS Take 1 tablet (0.5 mg total) by mouth daily. 02/04/16  Yes Claretta Fraise, MD  tiotropium (SPIRIVA) 18 MCG inhalation capsule Place 18 mcg into inhaler and inhale daily.   Yes Historical Provider, MD  torsemide (DEMADEX) 100 MG tablet Take 2 tablets (200 mg total) by mouth daily. 02/04/16 02/03/17 Yes Claretta Fraise, MD  vitamin B-12 (CYANOCOBALAMIN) 1000 MCG tablet Take 1,000 mcg by mouth daily.    Yes Historical Provider, MD    Family History Family History  Problem Relation Age of Onset  . Clotting disorder Mother   . Heart attack Mother   . Diabetes Father   . Heart attack Father   . Prostate cancer Brother   . Diabetes Brother     x 3  . Diabetes Sister     x 3  . Diabetes Maternal Aunt     Social History Social History  Substance Use Topics  . Smoking status:  Former Smoker    Types: Cigarettes    Quit date: 07/13/1993  . Smokeless tobacco: Never Used  . Alcohol use 0.0 oz/week     Comment: socially     Allergies   Nsaids   Review of Systems Review of Systems  Constitutional: Negative for chills and fever.  Respiratory: Positive for shortness of breath. Negative for cough.   Cardiovascular: Negative for chest pain, palpitations and leg swelling.  Gastrointestinal: Negative for abdominal pain, blood in stool, diarrhea, nausea and vomiting.  Genitourinary: Negative for dysuria, flank pain and hematuria.  Musculoskeletal: Negative for back pain and myalgias.  Skin: Positive for rash. Negative for wound.  Neurological: Positive for dizziness and light-headedness. Negative for syncope, weakness, numbness and headaches.  All other systems reviewed and are negative.  A complete 10 system review of systems  was obtained and all systems are negative except as noted in the HPI and PMH.    Physical Exam Updated Vital Signs BP (!) 76/58   Pulse 116   Temp 98.5 F (36.9 C) (Oral)   Resp 18   Ht 5\' 6"  (1.676 m)   Wt 187 lb 6.4 oz (85 kg)   SpO2 100%   BMI 30.25 kg/m   Physical Exam  Constitutional: He is oriented to person, place, and time. He appears well-developed and well-nourished. No distress.  HENT:  Head: Normocephalic and atraumatic.  Mouth/Throat: Oropharynx is clear and moist.  Eyes: EOM are normal. Pupils are equal, round, and reactive to light.  Neck: Normal range of motion. Neck supple. No JVD present.  Cardiovascular: Normal rate and regular rhythm.  Exam reveals no gallop and no friction rub.   No murmur heard. Pulmonary/Chest: Effort normal and breath sounds normal. No respiratory distress. He has no wheezes. He has no rales. He exhibits no tenderness.  Abdominal: Soft. Bowel sounds are normal. There is no tenderness. There is no rebound and no guarding.  Musculoskeletal: Normal range of motion. He exhibits no edema or  tenderness.  1+ lower extremity pitting edema bilaterally. No asymmetry or tenderness.  Neurological: He is alert and oriented to person, place, and time.  Skin: Skin is warm and dry. No rash noted. No erythema.  Patient with diffuse nonblanching petechial and purpuric rash to bilateral upper extremities.  Psychiatric: He has a normal mood and affect. His behavior is normal.  Nursing note and vitals reviewed.    ED Treatments / Results  DIAGNOSTIC STUDIES: Oxygen Saturation is 100% on RA, normal by my interpretation.    COORDINATION OF CARE: 1:48 PM Discussed treatment plan with pt at bedside and pt agreed to plan.  Labs (all labs ordered are listed, but only abnormal results are displayed) Labs Reviewed  BASIC METABOLIC PANEL - Abnormal; Notable for the following:       Result Value   Sodium 129 (*)    Chloride 88 (*)    Glucose, Bld 192 (*)    BUN 59 (*)    Creatinine, Ser 2.15 (*)    Calcium 10.6 (*)    GFR calc non Af Amer 32 (*)    GFR calc Af Amer 37 (*)    All other components within normal limits  CBC - Abnormal; Notable for the following:    WBC 12.8 (*)    RBC 3.01 (*)    Hemoglobin 10.1 (*)    HCT 31.1 (*)    MCV 103.3 (*)    RDW 17.8 (*)    Platelets 110 (*)    All other components within normal limits  TROPONIN I - Abnormal; Notable for the following:    Troponin I 0.03 (*)    All other components within normal limits  HEPATIC FUNCTION PANEL - Abnormal; Notable for the following:    Bilirubin, Direct <0.1 (*)    All other components within normal limits  LACTIC ACID, PLASMA - Abnormal; Notable for the following:    Lactic Acid, Venous 2.5 (*)    All other components within normal limits  CBG MONITORING, ED - Abnormal; Notable for the following:    Glucose-Capillary 116 (*)    All other components within normal limits  I-STAT CG4 LACTIC ACID, ED - Abnormal; Notable for the following:    Lactic Acid, Venous 3.53 (*)    All other components within normal  limits  CULTURE, BLOOD (  ROUTINE X 2)  CULTURE, BLOOD (ROUTINE X 2)  URINE CULTURE  URINALYSIS, ROUTINE W REFLEX MICROSCOPIC  BRAIN NATRIURETIC PEPTIDE  TYPE AND SCREEN    EKG  EKG Interpretation  Date/Time:  Friday March 04 2016 12:37:26 EST Ventricular Rate:  104 PR Interval:    QRS Duration: 89 QT Interval:  374 QTC Calculation: 492 R Axis:   13 Text Interpretation:  Sinus tachycardia Minimal ST elevation, inferior leads Borderline prolonged QT interval Baseline wander in lead(s) V2 Confirmed by Lita Mains  MD, Hughes Wyndham ([id number]) on 03/04/2016 4:57:41 PM       Radiology Dg Chest Port 1 View  Result Date: 03/04/2016 CLINICAL DATA:  Shortness of breath and near syncope. History of leukemia EXAM: PORTABLE CHEST 1 VIEW COMPARISON:  Chest radiograph February 19, 2016 and chest CT February 25, 2016 FINDINGS: There is no edema or consolidation. Heart size and pulmonary vascularity are normal. No adenopathy. Port-A-Cath tip is at the cavoatrial junction. No pneumothorax. Postoperative changes noted on the left. IMPRESSION: No edema or consolidation.  No evident adenopathy. Electronically Signed   By: Lowella Grip III M.D.   On: 03/04/2016 13:55    Procedures Procedures (including critical care time)  Medications Ordered in ED Medications  sodium chloride 0.9 % bolus 500 mL (not administered)  sodium chloride 0.9 % bolus 1,000 mL (1,000 mLs Intravenous New Bag/Given 03/04/16 1405)   CRITICAL CARE Performed by: Lita Mains, Natanel Snavely Total critical care time: 25 minutes Critical care time was exclusive of separately billable procedures and treating other patients. Critical care was necessary to treat or prevent imminent or life-threatening deterioration. Critical care was time spent personally by me on the following activities: development of treatment plan with patient and/or surrogate as well as nursing, discussions with consultants, evaluation of patient's response to treatment,  examination of patient, obtaining history from patient or surrogate, ordering and performing treatments and interventions, ordering and review of laboratory studies, ordering and review of radiographic studies, pulse oximetry and re-evaluation of patient's condition.   Initial Impression / Assessment and Plan / ED Course  I have reviewed the triage vital signs and the nursing notes.  Pertinent labs & imaging results that were available during my care of the patient were reviewed by me and considered in my medical decision making (see chart for details).  Clinical Course    Hypertension and lactic acidosis has improved with IV fluids. No evidence of infection. Suspect this is due to dehydration. Antibiotics held. Discussed with Dr.Opyd and will admit to telemetry bed.   Final Clinical Impressions(s) / ED Diagnoses   Final diagnoses:  Dehydration  Lactic acidosis  Hypotension, unspecified hypotension type    New Prescriptions New Prescriptions   No medications on file  I personally performed the services described in this documentation, which was scribed in my presence. The recorded information has been reviewed and is accurate.      Julianne Rice, MD 03/04/16 [phone]

## 2016-03-04 NOTE — ED Notes (Signed)
CRITICAL VALUE ALERT  Critical value received:  Lactic acid 2.5  Date of notification: 03/04/2016  Time of notification:  [id number]  Critical value read back:Yes.    Nurse who received alert:  [facility] RN  MD notified (1st page):  Dr. Lita Mains  Time of first page:  1642  MD notified (2nd page):  Time of second page:  Responding MD:  Dr. Lita Mains   Time MD responded: [phone]

## 2016-03-04 NOTE — ED Notes (Signed)
Transporting to room 330 via bed and on  monitor

## 2016-03-04 NOTE — ED Notes (Signed)
CRITICAL VALUE ALERT  Critical value received:  Troponin 0.03  Date of notification:  03/04/2016  Time of notification:  [id number]  Critical value read back:Yes.    Nurse who received alert:  [facility] RN  MD notified (1st page):  Dr. Lita Mains  Time of first page:  1433  MD notified (2nd page):  Time of second page:  Responding MD:  Dr. Lita Mains  Time MD responded:  [phone]

## 2016-03-04 NOTE — H&P (Signed)
History and Physical    Derek Shepherd [id number] DOB: 11/19/1955 DOA: 03/04/2016  PCP: Claretta Fraise, MD   Patient coming from: Home  Chief Complaint: Gen weakness, lightheadedness  HPI: Derek Shepherd is a 60 y.o. male with medical history significant for acute lymphocytic leukemia in remission, chronic combined systolic/diastolic CHF, GERD, COPD, and hypertension and presents to the emergency department with progressive generalized weakness and lightheadedness. He reports the insidious development of these symptoms over the past week, but they have become much worse in the past day or so. Patient reports that he experienced similar symptoms recently when his hemoglobin was found to have dropped and his condition improved significantly after transfusion. He denies any recent fevers or chills, denies chest pain or palpitations, and denies cough or dyspnea. Patient also denies headaches, change in vision or hearing, or focal numbness or weakness. Patient endorses a change in his sense of taste, but reports that appetite remains intact and he denies any significant change to his urination, denies abdominal pain or flank pain, and denies vomiting or diarrhea.  ED Course: Upon arrival to the ED, patient is found to be afebrile, saturating well on room air, tachycardic in the 110s, and with blood pressure of 76/58. EKG features a sinus tachycardia with rate 104 and nonspecific ST abnormality in the inferior leads. Chest x-ray is negative for edema or consolidation. Chemistry panel was notable for a sodium of 129, chloride 88, BUN 59, and serum creatinine of 2.15, up from 0.9 in October of this year. CBC is notable for a new leukocytosis 12,800 and stable macrocytic anemia with hemoglobin of 10.1. Platelets are stable at 110,000. Troponin is borderline elevated at 0.03, BNP is within normal limits, and lactic acid is elevated to a value of 3.53. Blood and urine cultures were obtained, type and screen  was performed, and the patient was treated with 1.5 L of normal saline. Lactic acid improved to 2.5 after fluid. Blood pressures also improved and heart rate has normalized. Patient will be admitted to the telemetry unit for ongoing evaluation and management of general weakness and malaise with acute renal failure, likely prerenal in the setting of apparent dehydration.  Review of Systems:  All other systems reviewed and apart from HPI, are negative.  Past Medical History:  Diagnosis Date  . Adenomatous colon polyp    tubular  . Anemia   . Anxiety   . Arthritis   . Asthma   . Bowel obstruction    constipation  . CHF (congestive heart failure) ([place])   . COPD (chronic obstructive pulmonary disease) ([place])   . Depression   . Diverticulosis   . DVT (deep venous thrombosis) ([place])   . Gallstones   . GERD (gastroesophageal reflux disease)   . 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 History of bone marrow transplant ([place])   . Leukemia-lymphoma, T-cell, acute, HTLV-I-associated ([place])   . Personal history of colonic  adenoma 01/22/2008    Past Surgical History:  Procedure Laterality Date  . BONE MARROW TRANSPLANT  2011  . CHOLECYSTECTOMY    . COLONOSCOPY W/ BIOPSIES    . EXPLORATORY LAPAROTOMY    . HERNIA REPAIR     x 2  . LUNG SURGERY Left      reports that he quit smoking about 22 years ago. His smoking use included Cigarettes. He has never used smokeless tobacco. He reports that he drinks alcohol. He reports that he does not use drugs.  Allergies  Allergen Reactions  . Nsaids Shortness Of Breath    Family History  Problem Relation Age of Onset  . Clotting disorder Mother   . Heart attack Mother   . Diabetes Father   . Heart attack Father   . Prostate cancer Brother   . Diabetes Brother     x 3  . Diabetes Sister     x 3  . Diabetes Maternal Aunt      Prior to Admission medications   Medication Sig Start Date End Date  Taking? Authorizing Provider  acidophilus (RISAQUAD) CAPS capsule Take 1 capsule by mouth daily.   Yes Historical Provider, MD  albuterol (PROVENTIL) (2.5 MG/3ML) 0.083% nebulizer solution Take 3 mLs (2.5 mg total) by nebulization every 6 (six) hours as needed for wheezing or shortness of breath. 05/24/14  Yes Claretta Fraise, MD  aMILoride (MIDAMOR) 5 MG tablet Take 1 tablet (5 mg total) by mouth daily. 02/04/16  Yes Claretta Fraise, MD  budesonide-formoterol [facility]) 160-4.5 MCG/ACT inhaler Inhale 2 puffs into the lungs daily.   Yes Historical Provider, MD  escitalopram (LEXAPRO) 10 MG tablet Take 10 mg by mouth daily.   Yes Historical Provider, MD  fluticasone (FLONASE) 50 MCG/ACT nasal spray Place 1 spray into both nostrils daily. 02/04/16  Yes Claretta Fraise, MD  folic acid (FOLVITE) 1 MG tablet Take 1 tablet (1 mg total) by mouth daily. 02/04/16  Yes Claretta Fraise, MD  lactulose (CHRONULAC) 10 GM/15ML solution Take 20 g by mouth 2 (two) times daily as needed for mild constipation.   Yes Historical Provider, MD  levalbuterol (XOPENEX) 1.25 MG/0.5ML nebulizer solution Take 1.25 mg by nebulization every 6 (six) hours as needed for wheezing or shortness of breath.   Yes Historical Provider, MD  metFORMIN (GLUCOPHAGE) 1000 MG tablet Take 1 tablet (1,000 mg total) by mouth 2 (two) times daily with a meal. 12/29/15  Yes Tammy Eckard, PharmD  montelukast (SINGULAIR) 10 MG tablet Take 1 tablet (10 mg total) by mouth daily. 02/04/16  Yes Claretta Fraise, MD  Multiple Vitamin (MULTIVITAMIN WITH MINERALS) TABS tablet Take 1 tablet by mouth daily.   Yes Historical Provider, MD  nefazodone (SERZONE) 50 MG tablet Take 1 tablet (50 mg total) by mouth at bedtime. 02/04/16  Yes Claretta Fraise, MD  pantoprazole (PROTONIX) 40 MG tablet Take 1 tablet (40 mg total) by mouth 2 (two) times daily. 02/04/16 02/03/17 Yes Claretta Fraise, MD  Polyethyl Glycol-Propyl Glycol (SYSTANE) 0.4-0.3 % SOLN Place 1 drop into both eyes as  needed (for dry eyes).   Yes Historical Provider, MD  potassium chloride (MICRO-K) 10 MEQ CR capsule Take 30 mEq by mouth 2 (two) times daily.   Yes Historical Provider, MD  rOPINIRole (REQUIP) 1 MG tablet Take 1 mg by mouth at bedtime.   Yes Historical Provider, MD  senna (SENOKOT) 8.6 MG tablet Take 2 tablets by mouth daily as needed for constipation.   Yes Historical Provider, MD  Sirolimus 0.5 MG TABS Take 1 tablet (0.5 mg total) by mouth daily. 02/04/16  Yes Claretta Fraise, MD  tiotropium (SPIRIVA) 18 MCG inhalation capsule Place 18 mcg into inhaler and inhale daily.   Yes Historical Provider, MD  torsemide (DEMADEX) 100 MG tablet Take 2 tablets (200 mg total) by mouth daily. 02/04/16 02/03/17 Yes Claretta Fraise, MD  vitamin B-12 (CYANOCOBALAMIN) 1000 MCG tablet Take 1,000 mcg by mouth daily.    Yes Historical Provider, MD    Physical Exam: Vitals:   03/04/16 1630 03/04/16  1730 03/04/16 1800 03/04/16 1830  BP: 105/64 104/65 97/73 (!) 84/56  Pulse: 81 80 100 82  Resp: 16 11 (!) 27 11  Temp:      TempSrc:      SpO2: 100% 100% 100% 100%  Weight:      Height:         Constitutional: NAD, calm, comfortable, chronically-ill appearing Eyes: PERTLA, lids and conjunctivae normal ENMT: Mucous membranes are moist. Posterior pharynx clear of any exudate or lesions.   Neck: normal, supple, no masses, no thyromegaly Respiratory: clear to auscultation bilaterally, no wheezing, no crackles. Normal respiratory effort.    Cardiovascular: Rate ~110 and regular. No significant murmur appreciated. No significant JVD. Abdomen: No distension, no tenderness, no masses palpated. Bowel sounds normal.  Musculoskeletal: no clubbing / cyanosis. No joint deformity upper and lower extremities. Normal muscle tone.  Skin: no significant rashes, lesions, ulcers. Warm, dry, well-perfused. Poor turgor.  Neurologic: CN 2-12 grossly intact. Sensation intact, DTR normal. Strength 5/5 in all 4 limbs.  Psychiatric:  Normal judgment and insight. Alert and oriented x 3. Normal mood and affect.    Labs on Admission: I have personally reviewed following labs and imaging studies  CBC:  Recent Labs Lab 03/04/16 1250  WBC 12.8*  HGB 10.1*  HCT 31.1*  MCV 103.3*  PLT [id number]*   Basic Metabolic Panel:  Recent Labs Lab 03/04/16 1250  NA 129*  K 4.8  CL 88*  CO2 32  GLUCOSE 192*  BUN 59*  CREATININE 2.15*  CALCIUM 10.6*   GFR: Estimated Creatinine Clearance: 38.3 mL/min (by C-G formula based on SCr of 2.15 mg/dL (H)). Liver Function Tests:  Recent Labs Lab 03/04/16 1250  AST 31  ALT 34  ALKPHOS 45  BILITOT 0.6  PROT 8.0  ALBUMIN 4.4   No results for input(s): LIPASE, AMYLASE in the last 168 hours. No results for input(s): AMMONIA in the last 168 hours. Coagulation Profile: No results for input(s): INR, PROTIME in the last 168 hours. Cardiac Enzymes:  Recent Labs Lab 03/04/16 1250  TROPONINI 0.03*   BNP (last 3 results) No results for input(s): PROBNP in the last 8760 hours. HbA1C: No results for input(s): HGBA1C in the last 72 hours. CBG:  Recent Labs Lab 03/04/16 1434  GLUCAP 116*   Lipid Profile: No results for input(s): CHOL, HDL, LDLCALC, TRIG, CHOLHDL, LDLDIRECT in the last 72 hours. Thyroid Function Tests: No results for input(s): TSH, T4TOTAL, FREET4, T3FREE, THYROIDAB in the last 72 hours. Anemia Panel: No results for input(s): VITAMINB12, FOLATE, FERRITIN, TIBC, IRON, RETICCTPCT in the last 72 hours. Urine analysis:    Component Value Date/Time   COLORURINE YELLOW 03/04/2016 [place] 03/04/2016 1445   LABSPEC 1.015 03/04/2016 1445   PHURINE 5.5 03/04/2016 1445   GLUCOSEU NEGATIVE 03/04/2016 1445   HGBUR NEGATIVE 03/04/2016 1445   BILIRUBINUR NEGATIVE 03/04/2016 1445   KETONESUR NEGATIVE 03/04/2016 1445   PROTEINUR NEGATIVE 03/04/2016 1445   UROBILINOGEN 1.0 07/18/2009 1936   NITRITE NEGATIVE 03/04/2016 1445   LEUKOCYTESUR NEGATIVE  03/04/2016 1445   Sepsis Labs: @LABRCNTIP (procalcitonin:4,lacticidven:4) ) Recent Results (from the past 240 hour(s))  Culture, blood (Routine X 2) w Reflex to ID Panel     Status: None (Preliminary result)   Collection Time: 03/04/16  2:23 PM  Result Value Ref Range Status   Specimen Description LEFT ANTECUBITAL  Final   Special Requests BOTTLES DRAWN AEROBIC AND ANAEROBIC 10CC EACH  Final   Culture PENDING  Incomplete  Report Status PENDING  Incomplete  Culture, blood (Routine X 2) w Reflex to ID Panel     Status: None (Preliminary result)   Collection Time: 03/04/16  2:28 PM  Result Value Ref Range Status   Specimen Description BLOOD LEFT ARM  Final   Special Requests BOTTLES DRAWN AEROBIC AND ANAEROBIC 4CC EACH  Final   Culture PENDING  Incomplete   Report Status PENDING  Incomplete     Radiological Exams on Admission: Dg Chest Port 1 View  Result Date: 03/04/2016 CLINICAL DATA:  Shortness of breath and near syncope. History of leukemia EXAM: PORTABLE CHEST 1 VIEW COMPARISON:  Chest radiograph February 19, 2016 and chest CT February 25, 2016 FINDINGS: There is no edema or consolidation. Heart size and pulmonary vascularity are normal. No adenopathy. Port-A-Cath tip is at the cavoatrial junction. No pneumothorax. Postoperative changes noted on the left. IMPRESSION: No edema or consolidation.  No evident adenopathy. Electronically Signed   By: Lowella Grip III M.D.   On: 03/04/2016 13:55    EKG: Independently reviewed. Sinus tachycardia (rate 104), non-specific ST abnormality inferiorly  Assessment/Plan  1. Acute kidney injury  - SCr 2.15 on admission, up from 0.9 in October 2017  - Wt is down ~10 lbs from earlier this month  - Likely a prerenal azotemia in the setting of dehydration, exacerbated by continued torsemide and amiloride use  - 1.5 L NS given in ED and IVF hydration with NS continued on admission  - Check urine studies and renal US, avoid nephrotoxins, repeat  chem panel in am   2. Hyponatremia  - Serum sodium is 129 in setting of dehydration  - He is being fluid-resuscitated with NS; diuretics are held   - Chem panel to be repeated in am    3. 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 Chronic combined systolic/diastolic CHF  - Pt is dehydrated on admission and is receiving IVF with NS as above  - TTE (12/28/14) with EF 99991111, grade 2 diastolic dysfunction, and mild LVH  - He is managed with torsemide and amiloride at home; these have been held as above  - Follow daily wts and I/Os, resume diuretics as appropriate   5. Hypotension, hx of HTN - BP as low as 76/58 in ED, improved with IVF; there is no CP or HA   - He has hx of HTN, now managed with diuretics only (held as above)  - Continue cautious fluid-resuscitation mindful of chronic CHF    6. Macrocytic anemia, thrombocytopenia  - Hgb is 10.1, platelets 110,000, and MCV 103.3 on admission - All indices stable relative to priors and no s/s of bleeding  - Likely secondary to sirolimus use    7. COPD  - Not in exacerbation on admission - Continue scheduled ICS/LABA, Spiriva, and prn nebs    DVT prophylaxis: sq heparin Code Status: Full  Family Communication: Wife updated at bedside Disposition Plan: Observe on telemetry Consults called: None Admission status: Observation    Vianne Bulls, MD [facility] Pager [phone]  If 7PM-7AM, please contact night-coverage [email or web address] Password [email or web address]  03/04/2016, 7:00 PM

## 2016-03-05 ENCOUNTER — Observation Stay (HOSPITAL_COMMUNITY): Payer: Medicare HMO

## 2016-03-05 DIAGNOSIS — C9101 Acute lymphoblastic leukemia, in remission: Secondary | ICD-10-CM

## 2016-03-05 DIAGNOSIS — I5042 Chronic combined systolic (congestive) and diastolic (congestive) heart failure: Secondary | ICD-10-CM

## 2016-03-05 DIAGNOSIS — N179 Acute kidney failure, unspecified: Secondary | ICD-10-CM

## 2016-03-05 LAB — CBC WITH DIFFERENTIAL/PLATELET
Basophils Absolute: 0 10*3/uL (ref 0.0–0.1)
Basophils Relative: 0 %
Eosinophils Absolute: 0.1 10*3/uL (ref 0.0–0.7)
Eosinophils Relative: 1 %
HEMATOCRIT: 25.6 % — AB (ref 39.0–52.0)
HEMOGLOBIN: 8.4 g/dL — AB (ref 13.0–17.0)
LYMPHS PCT: 33 %
Lymphs Abs: 3 10*3/uL (ref 0.7–4.0)
MCH: 33.7 pg (ref 26.0–34.0)
MCHC: 32.8 g/dL (ref 30.0–36.0)
MCV: 102.8 fL — AB (ref 78.0–100.0)
MONO ABS: 0.5 10*3/uL (ref 0.1–1.0)
Monocytes Relative: 6 %
NEUTROS ABS: 5.5 10*3/uL (ref 1.7–7.7)
NEUTROS PCT: 60 %
Platelets: 92 10*3/uL — ABNORMAL LOW (ref 150–400)
RBC: 2.49 MIL/uL — ABNORMAL LOW (ref 4.22–5.81)
RDW: 17.6 % — AB (ref 11.5–15.5)
WBC: 9.1 10*3/uL (ref 4.0–10.5)

## 2016-03-05 LAB — GLUCOSE, CAPILLARY
GLUCOSE-CAPILLARY: 111 mg/dL — AB (ref 65–99)
Glucose-Capillary: 92 mg/dL (ref 65–99)
Glucose-Capillary: 132 mg/dL — ABNORMAL HIGH (ref 65–99)
Glucose-Capillary: 137 mg/dL — ABNORMAL HIGH (ref 65–99)
Glucose-Capillary: 121 mg/dL — ABNORMAL HIGH (ref 65–99)

## 2016-03-05 LAB — BASIC METABOLIC PANEL
ANION GAP: 7 (ref 5–15)
BUN: 58 mg/dL — ABNORMAL HIGH (ref 6–20)
CO2: 33 mmol/L — AB (ref 22–32)
Calcium: 9.7 mg/dL (ref 8.9–10.3)
Chloride: 94 mmol/L — ABNORMAL LOW (ref 101–111)
Creatinine, Ser: 1.77 mg/dL — ABNORMAL HIGH (ref 0.61–1.24)
GFR, EST AFRICAN AMERICAN: 47 mL/min — AB (ref >60)
GFR, EST NON AFRICAN AMERICAN: 41 mL/min — AB (ref >60)
GLUCOSE: 104 mg/dL — AB (ref 65–99)
POTASSIUM: 4.9 mmol/L (ref 3.5–5.1)
Sodium: 134 mmol/L — ABNORMAL LOW (ref 135–145)

## 2016-03-05 MED ORDER — SODIUM CHLORIDE 0.9 % IV SOLN
INTRAVENOUS | Status: DC
  Administered 2016-03-05 (×2): via INTRAVENOUS

## 2016-03-05 NOTE — Progress Notes (Signed)
PROGRESS NOTE    Derek Shepherd  [id number] DOB: 1955/12/28 DOA: 03/04/2016 PCP: Claretta Fraise, MD     Brief Narrative:  60 year old man admitted from home on 12/21 due to weakness and lightheadedness found to have acute renal failure and admission has been requested.   Assessment & Plan:   Principal Problem:   AKI (acute kidney injury) ([place]) Active Problems:   ALL (acute lymphoid leukemia) in remission ([facility])   Leukocytosis   Anemia of chronic disease   Thrombocytopenia ([facility])   Depression   Chronic combined systolic and diastolic CHF (congestive heart failure) ([place])   Controlled type 2 diabetes mellitus without complication, without long-term current use of insulin ([facility])   Panlobular emphysema ([facility])   Hyponatremia   Hypotension   Acute renal failure (ARF) ([facility])   Dehydration   Acute renal failure -Improving with IV fluids, will continue cautious IV fluid resuscitation over the next 24 hours. -Creatinine is down to 1.77 from 2.15 on admission. -Baseline creatinine is around 1.5 so it is currently close to baseline.  Hyponatremia  -Improved with IV fluid resuscitation, due to dehydration.  Lactic acidosis -Due to hypoperfusion from dehydration, improving with fluid resuscitation, no signs of infection or sepsis.  Acute lymphocytic leukemia -In remission, continue outpatient follow-up with oncology as scheduled.  Chronic combined CHF -Patient is volume compensated, if anything was depleted on admission. -We'll continue cautious IV fluids for now. -Holding all diuretics for now  Hypotension with history of hypertension -Systolic blood pressure as low as 76 on day of admission. -BP improving with fluid resuscitation, all oral antihypertensive medications remain on hold.   DVT prophylaxis: Subcutaneous heparin Code Status: Full code Family Communication: Sister at bedside updated on plan of care and all questions answered Disposition Plan: Home when  medically stable, anticipate 24-48 hours  Consultants:   None  Procedures:   None  Antimicrobials:  Anti-infectives    None       Subjective: Feels improved  Objective: Vitals:   03/04/16 1800 03/04/16 1830 03/04/16 1944 03/05/16 0525  BP: 97/73 (!) 84/56 (!) 105/59 114/68  Pulse: 100 82 92 86  Resp: (!) 27 11 (!) 22 20  Temp:   99 F (37.2 C) 98.6 F (37 C)  TempSrc:   Oral Oral  SpO2: 100% 100% 100% 100%  Weight:   89.1 kg (196 lb 6.4 oz) 88.3 kg (194 lb 9.6 oz)  Height:   5\' 9"  (1.753 m)     Intake/Output Summary (Last 24 hours) at 03/05/16 1736 Last data filed at 03/05/16 1700  Gross per 24 hour  Intake                0 ml  Output             2100 ml  Net            -2100 ml   Filed Weights   03/04/16 1225 03/04/16 1944 03/05/16 0525  Weight: 85 kg (187 lb 6.4 oz) 89.1 kg (196 lb 6.4 oz) 88.3 kg (194 lb 9.6 oz)    Examination:  General exam: Alert, awake, oriented x 3 Respiratory system: Clear to auscultation. Respiratory effort normal. Cardiovascular system:RRR. No murmurs, rubs, gallops. Gastrointestinal system: Abdomen is nondistended, soft and nontender. No organomegaly or masses felt. Normal bowel sounds heard. Central nervous system: Alert and oriented. No focal neurological deficits. Extremities: No C/C/E, +pedal pulses Skin: No rashes, lesions or ulcers Psychiatry: Judgement and insight appear normal. Mood & affect appropriate.  Data Reviewed: I have personally reviewed following labs and imaging studies  CBC:  Recent Labs Lab 03/04/16 1250 03/05/16 0731  WBC 12.8* 9.1  NEUTROABS  --  5.5  HGB 10.1* 8.4*  HCT 31.1* 25.6*  MCV 103.3* 102.8*  PLT 110* 92*   Basic Metabolic Panel:  Recent Labs Lab 03/04/16 1250 03/05/16 0731  NA 129* 134*  K 4.8 4.9  CL 88* 94*  CO2 32 33*  GLUCOSE 192* 104*  BUN 59* 58*  CREATININE 2.15* 1.77*  CALCIUM 10.6* 9.7   GFR: Estimated Creatinine Clearance: 50 mL/min (by C-G formula  based on SCr of 1.77 mg/dL (H)). Liver Function Tests:  Recent Labs Lab 03/04/16 1250  AST 31  ALT 34  ALKPHOS 45  BILITOT 0.6  PROT 8.0  ALBUMIN 4.4   No results for input(s): LIPASE, AMYLASE in the last 168 hours. No results for input(s): AMMONIA in the last 168 hours. Coagulation Profile: No results for input(s): INR, PROTIME in the last 168 hours. Cardiac Enzymes:  Recent Labs Lab 03/04/16 1250 03/04/16 2057  TROPONINI 0.03* <0.03   BNP (last 3 results) No results for input(s): PROBNP in the last 8760 hours. HbA1C: No results for input(s): HGBA1C in the last 72 hours. CBG:  Recent Labs Lab 03/04/16 2100 03/05/16 0748 03/05/16 1147 03/05/16 1227 03/05/16 1641  GLUCAP 195* 92 137* 132* 121*   Lipid Profile: No results for input(s): CHOL, HDL, LDLCALC, TRIG, CHOLHDL, LDLDIRECT in the last 72 hours. Thyroid Function Tests:  Recent Labs  03/04/16 2057  TSH 3.672   Anemia Panel: No results for input(s): VITAMINB12, FOLATE, FERRITIN, TIBC, IRON, RETICCTPCT in the last 72 hours. Urine analysis:    Component Value Date/Time   COLORURINE YELLOW 03/04/2016 [place] 03/04/2016 1445   LABSPEC 1.015 03/04/2016 1445   PHURINE 5.5 03/04/2016 1445   GLUCOSEU NEGATIVE 03/04/2016 1445   HGBUR NEGATIVE 03/04/2016 1445   BILIRUBINUR NEGATIVE 03/04/2016 1445   KETONESUR NEGATIVE 03/04/2016 1445   PROTEINUR NEGATIVE 03/04/2016 1445   UROBILINOGEN 1.0 07/18/2009 1936   NITRITE NEGATIVE 03/04/2016 1445   LEUKOCYTESUR NEGATIVE 03/04/2016 1445   Sepsis Labs: @LABRCNTIP (procalcitonin:4,lacticidven:4)  ) Recent Results (from the past 240 hour(s))  Culture, blood (Routine X 2) w Reflex to ID Panel     Status: None (Preliminary result)   Collection Time: 03/04/16  2:23 PM  Result Value Ref Range Status   Specimen Description LEFT ANTECUBITAL  Final   Special Requests BOTTLES DRAWN AEROBIC AND ANAEROBIC 10CC EACH  Final   Culture NO GROWTH < 24 HOURS   Final   Report Status PENDING  Incomplete  Culture, blood (Routine X 2) w Reflex to ID Panel     Status: None (Preliminary result)   Collection Time: 03/04/16  2:28 PM  Result Value Ref Range Status   Specimen Description BLOOD LEFT ARM  Final   Special Requests BOTTLES DRAWN AEROBIC AND ANAEROBIC 4CC EACH  Final   Culture NO GROWTH < 24 HOURS  Final   Report Status PENDING  Incomplete         Radiology Studies: US Renal  Result Date: 03/05/2016 CLINICAL DATA:  Acute renal failure EXAM: RENAL / URINARY TRACT ULTRASOUND COMPLETE COMPARISON:  Chest CT 02/25/2016.  CT abdomen 07/16/2009 FINDINGS: Right Kidney: Length: 10.7 cm. Echogenicity within normal limits. No mass or hydronephrosis visualized. Left Kidney: Length: 12.2 cm. Previously seen left upper pole renal stone by chest CT not appreciated sonographically. Echogenicity within normal limits.  No mass or hydronephrosis visualized. Bladder: Appears normal for degree of bladder distention. IMPRESSION: No acute findings.  No hydronephrosis. Electronically Signed   By: Rolm Baptise M.D.   On: 03/05/2016 11:32   Dg Chest Port 1 View  Result Date: 03/04/2016 CLINICAL DATA:  Shortness of breath and near syncope. History of leukemia EXAM: PORTABLE CHEST 1 VIEW COMPARISON:  Chest radiograph February 19, 2016 and chest CT February 25, 2016 FINDINGS: There is no edema or consolidation. Heart size and pulmonary vascularity are normal. No adenopathy. Port-A-Cath tip is at the cavoatrial junction. No pneumothorax. Postoperative changes noted on the left. IMPRESSION: No edema or consolidation.  No evident adenopathy. Electronically Signed   By: Lowella Grip III M.D.   On: 03/04/2016 13:55        Scheduled Meds: . acidophilus  1 capsule Oral Daily  . escitalopram  10 mg Oral Daily  . fluticasone  1 spray Each Nare Daily  . folic acid  1 mg Oral Daily  . heparin  5,000 Units Subcutaneous Q8H  . insulin aspart  0-15 Units Subcutaneous TID  WC  . insulin aspart  0-5 Units Subcutaneous QHS  . mometasone-formoterol  2 puff Inhalation BID  . montelukast  10 mg Oral Daily  . multivitamin with minerals  1 tablet Oral Daily  . nefazodone  50 mg Oral QHS  . pantoprazole  40 mg Oral BID  . rOPINIRole  1 mg Oral QHS  . sodium chloride flush  3 mL Intravenous Q12H  . tiotropium  18 mcg Inhalation Daily  . vitamin B-12  1,000 mcg Oral Daily   Continuous Infusions: . sodium chloride 75 mL/hr at 03/05/16 1447     LOS: 0 days    Time spent: 25 minutes. Greater than 50% of this time was spent in direct contact with the patient coordinating care.     Lelon Frohlich, MD [facility] Pager [phone]  If 7PM-7AM, please contact night-coverage [email or web address] Password [facility] 03/05/2016, 5:36 PM

## 2016-03-05 NOTE — Progress Notes (Signed)
Normal Saline infusion stopped. Call light within reach nothing needed at this.

## 2016-03-06 DIAGNOSIS — I5042 Chronic combined systolic (congestive) and diastolic (congestive) heart failure: Secondary | ICD-10-CM | POA: Diagnosis not present

## 2016-03-06 DIAGNOSIS — N179 Acute kidney failure, unspecified: Secondary | ICD-10-CM | POA: Diagnosis not present

## 2016-03-06 DIAGNOSIS — C9101 Acute lymphoblastic leukemia, in remission: Secondary | ICD-10-CM | POA: Diagnosis not present

## 2016-03-06 LAB — BASIC METABOLIC PANEL
Anion gap: 4 — ABNORMAL LOW (ref 5–15)
BUN: 30 mg/dL — AB (ref 6–20)
CHLORIDE: 98 mmol/L — AB (ref 101–111)
CO2: 33 mmol/L — AB (ref 22–32)
CREATININE: 1.14 mg/dL (ref 0.61–1.24)
Calcium: 9.5 mg/dL (ref 8.9–10.3)
GFR calc Af Amer: >60 mL/min (ref >60)
GFR calc non Af Amer: >60 mL/min (ref >60)
Glucose, Bld: 112 mg/dL — ABNORMAL HIGH (ref 65–99)
POTASSIUM: 4.4 mmol/L (ref 3.5–5.1)
Sodium: 135 mmol/L (ref 135–145)

## 2016-03-06 LAB — GLUCOSE, CAPILLARY
GLUCOSE-CAPILLARY: 101 mg/dL — AB (ref 65–99)
GLUCOSE-CAPILLARY: 175 mg/dL — AB (ref 65–99)

## 2016-03-06 LAB — URINE CULTURE: Culture: NO GROWTH

## 2016-03-06 MED ORDER — TORSEMIDE 100 MG PO TABS: 100.0000 mg | ORAL_TABLET | Freq: Every day | ORAL | 11 refills | Status: DC

## 2016-03-06 NOTE — Progress Notes (Signed)
Patient states understanding of discharge instructions.  

## 2016-03-06 NOTE — Discharge Summary (Signed)
Physician Discharge Summary  Derek Shepherd [id number] DOB: 04-Oct-1955 DOA: 03/04/2016  PCP: Claretta Fraise, MD  Admit date: 03/04/2016 Discharge date: 03/06/2016  Time spent: 45 minutes  Recommendations for Outpatient Follow-up:  -Will be discharged home today. -Advised to follow-up with primary care provider in 2 weeks   Discharge Diagnoses:  Principal Problem:   AKI (acute kidney injury) ([place]) Active Problems:   ALL (acute lymphoid leukemia) in remission ([place])   Leukocytosis   Anemia of chronic disease   Thrombocytopenia (HCC)   Depression   Chronic combined systolic and diastolic CHF (congestive heart failure) ([place])   Controlled type 2 diabetes mellitus without complication, without long-term current use of insulin (HCC)   Panlobular emphysema (HCC)   Hyponatremia   Hypotension   Acute renal failure (ARF) ([place])   Dehydration   Discharge Condition: Stable and improved  Filed Weights   03/04/16 1944 03/05/16 0525 03/06/16 0725  Weight: 89.1 kg (196 lb 6.4 oz) 88.3 kg (194 lb 9.6 oz) 88.4 kg (194 lb 14.2 oz)    History of present illness:  As per Dr. Myna Hidalgo on 12/29: Derek Shepherd is a 60 y.o. male with medical history significant for acute lymphocytic leukemia in remission, chronic combined systolic/diastolic CHF, GERD, COPD, and hypertension and presents to the emergency department with progressive generalized weakness and lightheadedness. He reports the insidious development of these symptoms over the past week, but they have become much worse in the past day or so. Patient reports that he experienced similar symptoms recently when his hemoglobin was found to have dropped and his condition improved significantly after transfusion. He denies any recent fevers or chills, denies chest pain or palpitations, and denies cough or dyspnea. Patient also denies headaches, change in vision or hearing, or focal numbness or weakness. Patient endorses a change in his sense of  taste, but reports that appetite remains intact and he denies any significant change to his urination, denies abdominal pain or flank pain, and denies vomiting or diarrhea.  ED Course: Upon arrival to the ED, patient is found to be afebrile, saturating well on room air, tachycardic in the 110s, and with blood pressure of 76/58. EKG features a sinus tachycardia with rate 104 and nonspecific ST abnormality in the inferior leads. Chest x-ray is negative for edema or consolidation. Chemistry panel was notable for a sodium of 129, chloride 88, BUN 59, and serum creatinine of 2.15, up from 0.9 in October of this year. CBC is notable for a new leukocytosis 12,800 and stable macrocytic anemia with hemoglobin of 10.1. Platelets are stable at 110,000. Troponin is borderline elevated at 0.03, BNP is within normal limits, and lactic acid is elevated to a value of 3.53. Blood and urine cultures were obtained, type and screen was performed, and the patient was treated with 1.5 L of normal saline. Lactic acid improved to 2.5 after fluid. Blood pressures also improved and heart rate has normalized. Patient will be admitted to the telemetry unit for ongoing evaluation and management of general weakness and malaise with acute renal failure, likely prerenal in the setting of apparent dehydration.  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  Hyponatremia  -Improved with IV fluid resuscitation, due to dehydration.  Lactic acidosis -Due to hypoperfusion from dehydration, improving with fluid resuscitation, no signs of infection or sepsis.  Acute lymphocytic leukemia -In remission, continue outpatient follow-up with oncology as scheduled.  Chronic combined CHF -Patient is volume compensated, if anything was depleted on admission. -Diuretics have been restarted at half dose, torsemide has been decreased from 200-100 mg  daily  Hypotension with history of hypertension -Systolic blood pressure as low as 76 on day of admission. -Resolved with fluid resuscitation.    Procedures:  None   Consultations:  None  Discharge Instructions  Discharge Instructions    Diet - low sodium heart healthy    Complete by:  As directed    Increase activity slowly    Complete by:  As directed      Allergies as of 03/06/2016      Reactions   Nsaids Shortness Of Breath      Medication List    TAKE these medications   acidophilus Caps capsule Take 1 capsule by mouth daily.   albuterol (2.5 MG/3ML) 0.083% nebulizer solution Commonly known as:  PROVENTIL Take 3 mLs (2.5 mg total) by nebulization every 6 (six) hours as needed for wheezing or shortness of breath.   aMILoride 5 MG tablet Commonly known as:  MIDAMOR Take 1 tablet (5 mg total) by mouth daily.   budesonide-formoterol 160-4.5 MCG/ACT inhaler Commonly known as:  SYMBICORT Inhale 2 puffs into the lungs daily.   escitalopram 10 MG tablet Commonly known as:  LEXAPRO Take 10 mg by mouth daily.   fluticasone 50 MCG/ACT nasal spray Commonly known as:  FLONASE Place 1 spray into both nostrils daily.   folic acid 1 MG tablet Commonly known as:  FOLVITE Take 1 tablet (1 mg total) by mouth daily.   lactulose 10 GM/15ML solution Commonly known as:  CHRONULAC Take 20 g by mouth 2 (two) times daily as needed for mild constipation.   levalbuterol 1.25 MG/0.5ML nebulizer solution Commonly known as:  XOPENEX Take 1.25 mg by nebulization every 6 (six) hours as needed for wheezing or shortness of breath.   metFORMIN 1000 MG tablet Commonly known as:  GLUCOPHAGE Take 1 tablet (1,000 mg total) by mouth 2 (two) times daily with a meal.   montelukast 10 MG tablet Commonly known as:  SINGULAIR Take 1 tablet (10 mg total) by mouth daily.   multivitamin with minerals Tabs tablet Take 1 tablet by mouth daily.   nefazodone 50 MG tablet Commonly  known as:  SERZONE Take 1 tablet (50 mg total) by mouth at bedtime.   pantoprazole 40 MG tablet Commonly known as:  PROTONIX Take 1 tablet (40 mg total) by mouth 2 (two) times daily.   potassium chloride 10 MEQ CR capsule Commonly known as:  MICRO-K Take 30 mEq by mouth 2 (two) times daily.   rOPINIRole 1 MG tablet Commonly known as:  REQUIP Take 1 mg by mouth at bedtime.   senna 8.6 MG tablet Commonly known as:  SENOKOT Take 2 tablets by mouth daily as needed for constipation.   Sirolimus 0.5 MG Tabs Take 1 tablet (0.5 mg total) by mouth daily.   SYSTANE 0.4-0.3 % Soln Generic drug:  Polyethyl Glycol-Propyl Glycol Place 1 drop into both eyes as needed (for dry eyes).   tiotropium 18 MCG inhalation capsule Commonly known as:  SPIRIVA Place 18 mcg into inhaler and inhale daily.   torsemide 100 MG tablet Commonly known as:  DEMADEX Take 1 tablet (100 mg total) by mouth daily. What changed:  how much to take   vitamin B-12 1000 MCG tablet Commonly known as:  CYANOCOBALAMIN Take 1,000 mcg by mouth daily.      Allergies  Allergen Reactions  . Nsaids Shortness Of Breath   Follow-up Information    STACKS,WARREN, MD. Schedule an appointment as soon as possible for a visit in 1 week(s).   Specialty:  Family Medicine Contact information: [place] [address] [phone]            The results of significant diagnostics from this hospitalization (including imaging, microbiology, ancillary and laboratory) are listed below for reference.    Significant Diagnostic Studies: Ct Angio Chest Pe W Or Wo Contrast  Result Date: 02/25/2016 CLINICAL DATA:  Progress shortness of breath, elevated D-dimer, history of COPD, CHF, previously leukemia. EXAM: CT ANGIOGRAPHY CHEST WITH CONTRAST TECHNIQUE: Multidetector CT imaging of the chest was performed using the standard protocol during bolus administration of intravenous contrast. Multiplanar CT image  reconstructions and MIPs were obtained to evaluate the vascular anatomy. CONTRAST:  100 cc Isovue 370 COMPARISON:  11/24/2015 FINDINGS: Cardiovascular: The central and proximal hilar pulmonary arteries appear patent without significant filling defect or large pulmonary embolus. Limited assessment of the segmental and peripheral pulmonary arteries to exclude small emboli. Thoracic aortic atherosclerosis noted without significant aneurysm or dissection. Three-vessel arch anatomy. Major branch vessels remain patent. No mediastinal hemorrhage or hematoma. Normal heart size. No pericardial effusion. Right IJ double-lumen port catheter, tip in the proximal right atrium. Mediastinum/Nodes: No enlarged mediastinal, hilar, or axillary lymph nodes. Thyroid gland, trachea, and esophagus demonstrate no significant findings. Lungs/Pleura: small punctate calcified granulomas again evident in the lower lobes. Lungs remain clear. No focal pneumonia, collapse or consolidation. Negative for edema, interstitial process, or emphysema. Trachea and central airways are patent. No pleural abnormality. Left chest reconstruction noted with rib deformities as before. Chronic right lower lobe scarring. Upper Abdomen: Stable nonobstructing 5 mm calculus in the left kidney upper pole, image 85. Stable hypodense lesion in the posterior right liver, partially imaged, suspect small hepatic cyst. No acute upper abdominal process. Musculoskeletal: Stable deformity and reconstruction of the left ribs as before. No acute osseous finding. Review of the MIP images confirms the above findings. IMPRESSION: No significant acute central or proximal hilar pulmonary embolus. Limited assessment of the peripheral pulmonary arteries related to contrast opacification. Difficult to exclude small emboli. Thoracic aortic atherosclerosis Evidence of remote granulomatous disease Stable postoperative findings as above No other acute intrathoracic finding Electronically  Signed   By: Jerilynn Mages.  Shick M.D.   On: 02/25/2016 09:37   US Renal  Result Date: 03/05/2016 CLINICAL DATA:  Acute renal failure EXAM: RENAL / URINARY TRACT ULTRASOUND COMPLETE COMPARISON:  Chest CT 02/25/2016.  CT abdomen 07/16/2009 FINDINGS: Right Kidney: Length: 10.7 cm. Echogenicity within normal limits. No mass or hydronephrosis visualized. Left Kidney: Length: 12.2 cm. Previously seen left upper pole renal stone by chest CT not appreciated sonographically. Echogenicity within normal limits. No mass or hydronephrosis visualized. Bladder: Appears normal for degree of bladder distention. IMPRESSION: No acute findings.  No hydronephrosis. Electronically Signed   By: Rolm Baptise M.D.   On: 03/05/2016 11:32   Dg Chest Port 1 View  Result Date: 03/04/2016 CLINICAL DATA:  Shortness of breath and near syncope. History of leukemia EXAM: PORTABLE CHEST 1 VIEW COMPARISON:  Chest radiograph February 19, 2016 and chest CT February 25, 2016 FINDINGS: There is no edema or consolidation. Heart size and pulmonary vascularity are normal. No adenopathy. Port-A-Cath tip is at the cavoatrial junction. No pneumothorax. Postoperative changes noted on the left. IMPRESSION: No edema or consolidation.  No evident adenopathy. Electronically Signed   By:  Lowella Grip III M.D.   On: 03/04/2016 13:55   Dg Abd Acute W/chest  Result Date: 02/19/2016 CLINICAL DATA:  Abdominal pain and bloating. History of COPD, patient reports feeling that he cannot get a deep breath due to the bloating. History of lymphoma, CHF, former smoker. EXAM: DG ABDOMEN ACUTE W/ 1V CHEST COMPARISON:  PA and lateral chest x-ray of December 14, 2015. FINDINGS: The lungs are mildly hypoinflated but not greatly changed from the previous study. There is no alveolar infiltrate. There is no pleural effusion. Old surgical wire stabilization of rib fractures on the left. Within the abdomen the colonic stool burden is increased. There is no small or large bowel  obstructive pattern. There are surgical clips in the region of the GE junction and in the left paraspinous region. Numerous metallic coils are present likely from previous abdominal wall hernia repair. The bony structures exhibit no acute abnormalities. IMPRESSION: Increase colonic stool burden consistent with constipation in the appropriate clinical setting. No evidence of bowel obstruction. Mild hypoinflation, chronic.  No pneumonia nor CHF. Electronically Signed   By: David  Martinique M.D.   On: 02/19/2016 12:04    Microbiology: Recent Results (from the past 240 hour(s))  Culture, blood (Routine X 2) w Reflex to ID Panel     Status: None (Preliminary result)   Collection Time: 03/04/16  2:23 PM  Result Value Ref Range Status   Specimen Description LEFT ANTECUBITAL  Final   Special Requests BOTTLES DRAWN AEROBIC AND ANAEROBIC 10CC EACH  Final   Culture NO GROWTH 2 DAYS  Final   Report Status PENDING  Incomplete  Culture, blood (Routine X 2) w Reflex to ID Panel     Status: None (Preliminary result)   Collection Time: 03/04/16  2:28 PM  Result Value Ref Range Status   Specimen Description BLOOD LEFT ARM  Final   Special Requests BOTTLES DRAWN AEROBIC AND ANAEROBIC 4CC EACH  Final   Culture NO GROWTH 2 DAYS  Final   Report Status PENDING  Incomplete  Urine culture     Status: None   Collection Time: 03/04/16  2:45 PM  Result Value Ref Range Status   Specimen Description URINE, RANDOM  Final   Special Requests NONE  Final   Culture NO GROWTH Performed at [facility]   Final   Report Status 03/06/2016 FINAL  Final     Labs: Basic Metabolic Panel:  Recent Labs Lab 03/04/16 1250 03/05/16 0731 03/06/16 0726  NA 129* 134* 135  K 4.8 4.9 4.4  CL 88* 94* 98*  CO2 32 33* 33*  GLUCOSE 192* 104* 112*  BUN 59* 58* 30*  CREATININE 2.15* 1.77* 1.14  CALCIUM 10.6* 9.7 9.5   Liver Function Tests:  Recent Labs Lab 03/04/16 1250  AST 31  ALT 34  ALKPHOS 45  BILITOT 0.6    PROT 8.0  ALBUMIN 4.4   No results for input(s): LIPASE, AMYLASE in the last 168 hours. No results for input(s): AMMONIA in the last 168 hours. CBC:  Recent Labs Lab 03/04/16 1250 03/05/16 0731  WBC 12.8* 9.1  NEUTROABS  --  5.5  HGB 10.1* 8.4*  HCT 31.1* 25.6*  MCV 103.3* 102.8*  PLT 110* 92*   Cardiac Enzymes:  Recent Labs Lab 03/04/16 1250 03/04/16 2057  TROPONINI 0.03* <0.03   BNP: BNP (last 3 results)  Recent Labs  12/14/15 1235 02/19/16 1154 03/04/16 1250  BNP 104.5* 25.6 49.0    ProBNP (last 3 results)  No results for input(s): PROBNP in the last 8760 hours.  CBG:  Recent Labs Lab 03/05/16 1227 03/05/16 1641 03/05/16 2242 03/06/16 0758 03/06/16 1143  GLUCAP 132* 121* 111* 101* 175*       Signed:  HERNANDEZ ACOSTA,Maayan Jenning  [facility] Pager: [phone] 03/06/2016, 4:30 PM

## 2016-03-08 ENCOUNTER — Telehealth: Payer: Self-pay | Admitting: *Deleted

## 2016-03-09 DIAGNOSIS — D89813 Graft-versus-host disease, unspecified: Secondary | ICD-10-CM | POA: Diagnosis not present

## 2016-03-09 DIAGNOSIS — Z961 Presence of intraocular lens: Secondary | ICD-10-CM | POA: Diagnosis not present

## 2016-03-09 DIAGNOSIS — H04123 Dry eye syndrome of bilateral lacrimal glands: Secondary | ICD-10-CM | POA: Diagnosis not present

## 2016-03-09 LAB — CULTURE, BLOOD (ROUTINE X 2)
CULTURE: NO GROWTH
Culture: NO GROWTH

## 2016-03-10 ENCOUNTER — Telehealth: Payer: Self-pay | Admitting: *Deleted

## 2016-03-10 ENCOUNTER — Encounter: Payer: Self-pay | Admitting: Nurse Practitioner

## 2016-03-10 ENCOUNTER — Ambulatory Visit (INDEPENDENT_AMBULATORY_CARE_PROVIDER_SITE_OTHER): Payer: Medicare HMO | Admitting: Nurse Practitioner

## 2016-03-10 VITALS — BP 103/64 | HR 99 | Temp 96.9°F | Ht 69.0 in | Wt 208.0 lb

## 2016-03-10 DIAGNOSIS — R000 Tachycardia, unspecified: Secondary | ICD-10-CM | POA: Diagnosis not present

## 2016-03-10 DIAGNOSIS — I5042 Chronic combined systolic (congestive) and diastolic (congestive) heart failure: Secondary | ICD-10-CM

## 2016-03-10 DIAGNOSIS — K5903 Drug induced constipation: Secondary | ICD-10-CM | POA: Diagnosis not present

## 2016-03-10 DIAGNOSIS — G894 Chronic pain syndrome: Secondary | ICD-10-CM | POA: Diagnosis not present

## 2016-03-10 DIAGNOSIS — M79605 Pain in left leg: Secondary | ICD-10-CM | POA: Diagnosis not present

## 2016-03-10 DIAGNOSIS — R0602 Shortness of breath: Secondary | ICD-10-CM

## 2016-03-10 DIAGNOSIS — Z5181 Encounter for therapeutic drug level monitoring: Secondary | ICD-10-CM | POA: Diagnosis not present

## 2016-03-10 DIAGNOSIS — Z79899 Other long term (current) drug therapy: Secondary | ICD-10-CM | POA: Diagnosis not present

## 2016-03-10 NOTE — Progress Notes (Signed)
   Subjective:    Patient ID: Derek Shepherd, male    DOB: January 10, 1956, 61 y.o.   MRN: [id number]  HPI  Patient in today for hospital follow up- he was in hospital for dehydration and they cut his fluid pill in half. His weight has gone up 4lbs since Sunday and last night he started getting short of breath. He has  CHF and is on o2 at 2l via cannula 24/7. He is much better today. Does not feel SOB today.   Review of Systems  Constitutional: Negative.   HENT: Negative.   Respiratory: Positive for shortness of breath.   Cardiovascular: Negative.  Negative for chest pain and leg swelling.  Genitourinary: Negative.   Musculoskeletal: Negative.   Neurological: Negative.   Psychiatric/Behavioral: Negative.   All other systems reviewed and are negative.      Objective:   Physical Exam  Constitutional: He is oriented to person, place, and time. He appears well-developed and well-nourished.  Cardiovascular: Normal rate, regular rhythm and normal heart sounds.   Pulmonary/Chest: Effort normal and breath sounds normal.  Neurological: He is alert and oriented to person, place, and time.  Skin: Skin is warm.  Psychiatric: He has a normal mood and affect. His behavior is normal. Judgment and thought content normal.   BP 103/64   Pulse 99   Temp (!) 96.9 F (36.1 C) (Oral)   Ht 5\' 9"  (1.753 m)   Wt 208 lb (94.3 kg)   SpO2 99% Comment: With O2  BMI 30.72 kg/m         Assessment & Plan:   1. Chronic combined systolic and diastolic CHF (congestive heart failure) ([place])   2. SOB (shortness of breath)    Continue home O@ Daily weight checks- if haas 2lb weight gain in 24 hours or starts having peripheral edema- then increase fluid pill back up. If not continue current dose and follow up with Dr. Livia Snellen in 1 week.  Mary-Margaret Hassell Done, FNP

## 2016-03-10 NOTE — Telephone Encounter (Signed)
Needs to be seen today Can increase torsemide to 100mg  BID for two days  If SOB not improving or worsening needs to be seen in ED

## 2016-03-10 NOTE — Telephone Encounter (Signed)
Patient has a follow up appointment scheduled. 

## 2016-03-10 NOTE — Telephone Encounter (Signed)
Pt had recent hospitalization for dehydration Torsemide 100mg  was decreased to qd instead of BID per hopitalist Pt's weight was 188lb at discharge Today weight is 194lb and pt is having SOB with SpO2 of 79% on 3lpm per pt's wife O2 was increased to 5lpm and SpO2 increased to 99% Informed wife pt needs to be evaluated by DR  appt was declined due to pt having Pain Management appt today at [facility] Please review and advise

## 2016-03-11 ENCOUNTER — Other Ambulatory Visit: Payer: Self-pay | Admitting: Nurse Practitioner

## 2016-03-11 ENCOUNTER — Telehealth: Payer: Self-pay | Admitting: Physician Assistant

## 2016-03-11 ENCOUNTER — Other Ambulatory Visit: Payer: Self-pay | Admitting: Physician Assistant

## 2016-03-11 DIAGNOSIS — L03114 Cellulitis of left upper limb: Secondary | ICD-10-CM

## 2016-03-11 MED ORDER — CEPHALEXIN 500 MG PO CAPS: 500.0000 mg | ORAL_CAPSULE | Freq: Three times a day (TID) | ORAL | 0 refills | Status: DC

## 2016-03-11 NOTE — Telephone Encounter (Signed)
Patient seen Derek Shepherd yesterday and states that he has an infected tooth. Patient states that he pulled out the filling in this tooth this morning with a tooth pick. Can not get in with dentist today and states that he will call them back Monday to see if he can get in to get tooth pulled. States that he was told he needs to be on Keflex before having a tooth pulled. Please advise.

## 2016-03-14 DIAGNOSIS — Z86711 Personal history of pulmonary embolism: Secondary | ICD-10-CM | POA: Diagnosis not present

## 2016-03-14 DIAGNOSIS — R079 Chest pain, unspecified: Secondary | ICD-10-CM | POA: Diagnosis not present

## 2016-03-14 DIAGNOSIS — R0602 Shortness of breath: Secondary | ICD-10-CM | POA: Diagnosis not present

## 2016-03-14 DIAGNOSIS — R071 Chest pain on breathing: Secondary | ICD-10-CM | POA: Diagnosis not present

## 2016-03-14 DIAGNOSIS — I959 Hypotension, unspecified: Secondary | ICD-10-CM | POA: Diagnosis not present

## 2016-03-14 DIAGNOSIS — C9100 Acute lymphoblastic leukemia not having achieved remission: Secondary | ICD-10-CM | POA: Diagnosis not present

## 2016-03-14 DIAGNOSIS — R0781 Pleurodynia: Secondary | ICD-10-CM | POA: Diagnosis not present

## 2016-03-15 DIAGNOSIS — N179 Acute kidney failure, unspecified: Secondary | ICD-10-CM | POA: Diagnosis not present

## 2016-03-15 DIAGNOSIS — C9100 Acute lymphoblastic leukemia not having achieved remission: Secondary | ICD-10-CM | POA: Diagnosis not present

## 2016-03-15 DIAGNOSIS — D696 Thrombocytopenia, unspecified: Secondary | ICD-10-CM | POA: Diagnosis not present

## 2016-03-15 DIAGNOSIS — E119 Type 2 diabetes mellitus without complications: Secondary | ICD-10-CM | POA: Diagnosis not present

## 2016-03-15 DIAGNOSIS — R0781 Pleurodynia: Secondary | ICD-10-CM | POA: Diagnosis not present

## 2016-03-15 DIAGNOSIS — R091 Pleurisy: Secondary | ICD-10-CM | POA: Diagnosis not present

## 2016-03-15 DIAGNOSIS — T501X5A Adverse effect of loop [high-ceiling] diuretics, initial encounter: Secondary | ICD-10-CM | POA: Diagnosis not present

## 2016-03-15 DIAGNOSIS — R0602 Shortness of breath: Secondary | ICD-10-CM | POA: Diagnosis not present

## 2016-03-15 DIAGNOSIS — D89811 Chronic graft-versus-host disease: Secondary | ICD-10-CM | POA: Diagnosis not present

## 2016-03-15 DIAGNOSIS — R109 Unspecified abdominal pain: Secondary | ICD-10-CM | POA: Diagnosis not present

## 2016-03-15 DIAGNOSIS — Z66 Do not resuscitate: Secondary | ICD-10-CM | POA: Diagnosis not present

## 2016-03-15 DIAGNOSIS — Z86718 Personal history of other venous thrombosis and embolism: Secondary | ICD-10-CM | POA: Diagnosis not present

## 2016-03-15 DIAGNOSIS — Z9484 Stem cells transplant status: Secondary | ICD-10-CM | POA: Diagnosis not present

## 2016-03-15 DIAGNOSIS — R079 Chest pain, unspecified: Secondary | ICD-10-CM | POA: Diagnosis not present

## 2016-03-15 DIAGNOSIS — C9101 Acute lymphoblastic leukemia, in remission: Secondary | ICD-10-CM | POA: Diagnosis not present

## 2016-03-15 DIAGNOSIS — Z86711 Personal history of pulmonary embolism: Secondary | ICD-10-CM | POA: Diagnosis not present

## 2016-03-15 DIAGNOSIS — D649 Anemia, unspecified: Secondary | ICD-10-CM | POA: Diagnosis not present

## 2016-03-15 DIAGNOSIS — G8929 Other chronic pain: Secondary | ICD-10-CM | POA: Diagnosis not present

## 2016-03-15 DIAGNOSIS — B974 Respiratory syncytial virus as the cause of diseases classified elsewhere: Secondary | ICD-10-CM | POA: Diagnosis not present

## 2016-03-15 DIAGNOSIS — I5032 Chronic diastolic (congestive) heart failure: Secondary | ICD-10-CM | POA: Diagnosis not present

## 2016-03-15 DIAGNOSIS — T865 Complications of stem cell transplant: Secondary | ICD-10-CM | POA: Diagnosis not present

## 2016-03-15 DIAGNOSIS — R69 Illness, unspecified: Secondary | ICD-10-CM | POA: Diagnosis not present

## 2016-03-17 ENCOUNTER — Ambulatory Visit: Payer: Medicare HMO | Admitting: Nurse Practitioner

## 2016-03-17 ENCOUNTER — Telehealth: Payer: Self-pay | Admitting: Family Medicine

## 2016-03-18 DIAGNOSIS — R0902 Hypoxemia: Secondary | ICD-10-CM | POA: Diagnosis not present

## 2016-03-29 DIAGNOSIS — C9101 Acute lymphoblastic leukemia, in remission: Secondary | ICD-10-CM | POA: Diagnosis not present

## 2016-03-29 DIAGNOSIS — R000 Tachycardia, unspecified: Secondary | ICD-10-CM | POA: Diagnosis not present

## 2016-03-29 DIAGNOSIS — G4700 Insomnia, unspecified: Secondary | ICD-10-CM | POA: Diagnosis not present

## 2016-03-29 DIAGNOSIS — R69 Illness, unspecified: Secondary | ICD-10-CM | POA: Diagnosis not present

## 2016-03-29 DIAGNOSIS — J449 Chronic obstructive pulmonary disease, unspecified: Secondary | ICD-10-CM | POA: Diagnosis not present

## 2016-03-29 DIAGNOSIS — D89813 Graft-versus-host disease, unspecified: Secondary | ICD-10-CM | POA: Diagnosis not present

## 2016-03-29 DIAGNOSIS — I5030 Unspecified diastolic (congestive) heart failure: Secondary | ICD-10-CM | POA: Diagnosis not present

## 2016-03-29 DIAGNOSIS — K5900 Constipation, unspecified: Secondary | ICD-10-CM | POA: Diagnosis not present

## 2016-03-29 DIAGNOSIS — G8929 Other chronic pain: Secondary | ICD-10-CM | POA: Diagnosis not present

## 2016-03-29 DIAGNOSIS — D7589 Other specified diseases of blood and blood-forming organs: Secondary | ICD-10-CM | POA: Diagnosis not present

## 2016-03-29 DIAGNOSIS — I509 Heart failure, unspecified: Secondary | ICD-10-CM | POA: Diagnosis not present

## 2016-03-29 DIAGNOSIS — D801 Nonfamilial hypogammaglobulinemia: Secondary | ICD-10-CM | POA: Diagnosis not present

## 2016-04-05 DIAGNOSIS — R0902 Hypoxemia: Secondary | ICD-10-CM | POA: Diagnosis not present

## 2016-04-06 ENCOUNTER — Emergency Department (HOSPITAL_COMMUNITY): Payer: Medicare HMO

## 2016-04-06 ENCOUNTER — Observation Stay (HOSPITAL_COMMUNITY)
Admission: EM | Admit: 2016-04-06 | Discharge: 2016-04-08 | Disposition: A | Discharge status: Home / Self Care | Payer: Medicare HMO | Attending: Internal Medicine | Admitting: Internal Medicine

## 2016-04-06 ENCOUNTER — Ambulatory Visit (INDEPENDENT_AMBULATORY_CARE_PROVIDER_SITE_OTHER): Payer: Medicare HMO | Admitting: Family Medicine

## 2016-04-06 ENCOUNTER — Telehealth: Payer: Self-pay | Admitting: Family Medicine

## 2016-04-06 ENCOUNTER — Encounter (HOSPITAL_COMMUNITY): Payer: Self-pay

## 2016-04-06 ENCOUNTER — Encounter: Payer: Self-pay | Admitting: Family Medicine

## 2016-04-06 VITALS — BP 87/58 | HR 103 | Temp 98.7°F | Ht 69.0 in | Wt 211.0 lb

## 2016-04-06 DIAGNOSIS — Z9981 Dependence on supplemental oxygen: Secondary | ICD-10-CM | POA: Insufficient documentation

## 2016-04-06 DIAGNOSIS — R0600 Dyspnea, unspecified: Secondary | ICD-10-CM | POA: Diagnosis not present

## 2016-04-06 DIAGNOSIS — D696 Thrombocytopenia, unspecified: Secondary | ICD-10-CM | POA: Insufficient documentation

## 2016-04-06 DIAGNOSIS — J9611 Chronic respiratory failure with hypoxia: Secondary | ICD-10-CM | POA: Diagnosis not present

## 2016-04-06 DIAGNOSIS — D500 Iron deficiency anemia secondary to blood loss (chronic): Secondary | ICD-10-CM

## 2016-04-06 DIAGNOSIS — E119 Type 2 diabetes mellitus without complications: Secondary | ICD-10-CM

## 2016-04-06 DIAGNOSIS — F419 Anxiety disorder, unspecified: Secondary | ICD-10-CM | POA: Diagnosis not present

## 2016-04-06 DIAGNOSIS — D638 Anemia in other chronic diseases classified elsewhere: Secondary | ICD-10-CM | POA: Diagnosis not present

## 2016-04-06 DIAGNOSIS — I5042 Chronic combined systolic (congestive) and diastolic (congestive) heart failure: Secondary | ICD-10-CM | POA: Insufficient documentation

## 2016-04-06 DIAGNOSIS — Z79899 Other long term (current) drug therapy: Secondary | ICD-10-CM | POA: Diagnosis not present

## 2016-04-06 DIAGNOSIS — Z7951 Long term (current) use of inhaled steroids: Secondary | ICD-10-CM | POA: Diagnosis not present

## 2016-04-06 DIAGNOSIS — Z7984 Long term (current) use of oral hypoglycemic drugs: Secondary | ICD-10-CM | POA: Insufficient documentation

## 2016-04-06 DIAGNOSIS — J431 Panlobular emphysema: Secondary | ICD-10-CM | POA: Diagnosis present

## 2016-04-06 DIAGNOSIS — I2782 Chronic pulmonary embolism: Secondary | ICD-10-CM | POA: Diagnosis present

## 2016-04-06 DIAGNOSIS — I5032 Chronic diastolic (congestive) heart failure: Secondary | ICD-10-CM | POA: Diagnosis present

## 2016-04-06 DIAGNOSIS — D649 Anemia, unspecified: Secondary | ICD-10-CM | POA: Diagnosis present

## 2016-04-06 DIAGNOSIS — Z87891 Personal history of nicotine dependence: Secondary | ICD-10-CM | POA: Insufficient documentation

## 2016-04-06 DIAGNOSIS — F329 Major depressive disorder, single episode, unspecified: Secondary | ICD-10-CM | POA: Diagnosis not present

## 2016-04-06 DIAGNOSIS — Z9481 Bone marrow transplant status: Secondary | ICD-10-CM

## 2016-04-06 DIAGNOSIS — R531 Weakness: Secondary | ICD-10-CM

## 2016-04-06 DIAGNOSIS — C9101 Acute lymphoblastic leukemia, in remission: Secondary | ICD-10-CM | POA: Diagnosis not present

## 2016-04-06 DIAGNOSIS — R0602 Shortness of breath: Secondary | ICD-10-CM | POA: Diagnosis not present

## 2016-04-06 DIAGNOSIS — Z86718 Personal history of other venous thrombosis and embolism: Secondary | ICD-10-CM | POA: Diagnosis not present

## 2016-04-06 DIAGNOSIS — R69 Illness, unspecified: Secondary | ICD-10-CM | POA: Diagnosis not present

## 2016-04-06 DIAGNOSIS — R42 Dizziness and giddiness: Secondary | ICD-10-CM

## 2016-04-06 LAB — COMPREHENSIVE METABOLIC PANEL
ALBUMIN: 3.6 g/dL (ref 3.5–5.0)
ALT: 16 U/L — AB (ref 17–63)
AST: 21 U/L (ref 15–41)
Alkaline Phosphatase: 56 U/L (ref 38–126)
Anion gap: 11 (ref 5–15)
BILIRUBIN TOTAL: 0.4 mg/dL (ref 0.3–1.2)
BUN: 19 mg/dL (ref 6–20)
CHLORIDE: 92 mmol/L — AB (ref 101–111)
CO2: 32 mmol/L (ref 22–32)
CREATININE: 1.15 mg/dL (ref 0.61–1.24)
Calcium: 9.6 mg/dL (ref 8.9–10.3)
GFR calc Af Amer: >60 mL/min (ref >60)
GFR calc non Af Amer: >60 mL/min (ref >60)
GLUCOSE: 128 mg/dL — AB (ref 65–99)
POTASSIUM: 3.5 mmol/L (ref 3.5–5.1)
Sodium: 135 mmol/L (ref 135–145)
Total Protein: 6 g/dL — ABNORMAL LOW (ref 6.5–8.1)

## 2016-04-06 LAB — TROPONIN I: Troponin I: <0.03 ng/mL (ref <0.03)

## 2016-04-06 LAB — FINGERSTICK HEMOGLOBIN: HEMOGLOBIN: 6.5 g/dL — AB (ref 12.6–17.7)

## 2016-04-06 LAB — CBC
HEMATOCRIT: 22.6 % — AB (ref 39.0–52.0)
Hemoglobin: 6.9 g/dL — CL (ref 13.0–17.0)
MCH: 30.7 pg (ref 26.0–34.0)
MCHC: 30.5 g/dL (ref 30.0–36.0)
MCV: 100.4 fL — AB (ref 78.0–100.0)
Platelets: 64 10*3/uL — ABNORMAL LOW (ref 150–400)
RBC: 2.25 MIL/uL — ABNORMAL LOW (ref 4.22–5.81)
RDW: 18 % — AB (ref 11.5–15.5)
WBC: 5.1 10*3/uL (ref 4.0–10.5)

## 2016-04-06 LAB — PREPARE RBC (CROSSMATCH)

## 2016-04-06 MED ORDER — SODIUM CHLORIDE 0.9 % IV SOLN
10.0000 mL/h | Freq: Once | INTRAVENOUS | Status: AC
  Administered 2016-04-06: 10 mL/h via INTRAVENOUS

## 2016-04-06 MED ORDER — AEROCHAMBER PLUS W/MASK MISC: 1.0000 | Freq: Once | Status: DC

## 2016-04-06 MED ORDER — SODIUM CHLORIDE 0.9% FLUSH: 10.0000 mL | INTRAVENOUS | Status: DC | PRN

## 2016-04-06 NOTE — ED Notes (Signed)
Dr. Rex Kras informed of pts critical hgb of 6.9. No new orders at this time.

## 2016-04-06 NOTE — Progress Notes (Signed)
Subjective:  Patient ID: Derek Shepherd, male    DOB: [birth year]  Age: 61 y.o. MRN: [id number]  CC: Fatigue (pt here today c/o general weakness and having dizzy spells when standing up and walking)   HPI Derek Shepherd presents for Symptoms are as noted above. Patient can hardly stand up and walk across the room because he gets so dizzy he feels he is, passout and fall. He is generally dizzy. There is nothing focal. He received a transfusion at [facility] last week for a hemoglobin of 6.9.Chest a couple of weeks before that he had 2 units of packed red cells at [facility] and the hemoglobin dropped from 8.5 prior to transfusion to the 6.9 at [facility] last week. He is a cancer survivor and had bone marrow. transplant several years ago. Apparently he's been told that the transplant "may be wearing out." This is apparently due to graft-versus-host.it's noted that his platelets have been down at about 43,000. And his transfusion seems to not met any improvement of hemoglobin. He was seen by cardiology last week and told that his heart was strong. He was not in heart failure. Patient denies excessive edema today.  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   He has a past surgical history that includes Cholecystectomy; Lung surgery (Left); Exploratory laparotomy; Hernia repair; Colonoscopy w/ biopsies; and Bone marrow transplant (2011).   His family history includes Clotting  disorder in his mother; Diabetes in his brother, father, maternal aunt, and sister; Heart attack in his father and mother; Prostate cancer in his brother.He reports that he quit smoking about 22 years ago. His smoking use included Cigarettes. He has never used smokeless tobacco. He reports that he drinks alcohol. He reports that he does not use drugs.    ROS Review of Systems  Constitutional: Negative for chills, diaphoresis, fever and unexpected weight change.  HENT: Negative for congestion, hearing loss, rhinorrhea and sore throat.   Eyes: Negative for visual disturbance.  Respiratory: Positive for shortness of breath and wheezing. Negative for cough.   Cardiovascular: Negative for chest pain.  Gastrointestinal: Negative for abdominal pain, constipation and diarrhea.  Genitourinary: Negative for dysuria and flank pain.  Musculoskeletal: Negative for arthralgias and joint swelling.  Skin: Negative for rash.  Neurological: Positive for dizziness, syncope, weakness and light-headedness. Negative for numbness and headaches.  Psychiatric/Behavioral: Negative for dysphoric mood and sleep disturbance.    Objective:  BP (!) 87/58   Pulse (!) 103   Temp 98.7 F (37.1 C) (Oral)   Ht _0  (1.753 m)   Wt 211 lb (95.7 kg)   SpO2 100%   BMI 31.16 kg/m   BP Readings from Last 3 Encounters:  04/08/16 (!) 108/51  04/06/16 (!) 87/58  03/10/16 103/64    Wt Readings from Last 3 Encounters:  04/06/16 211 lb (95.7 kg)  03/10/16 208 lb (94.3 kg)  03/06/16 194 lb 14.2 oz (88.4 kg)     Physical Exam  Constitutional: He is oriented to person, place, and time. He appears well-developed. He appears distressed.  HENT:  Head: Normocephalic and atraumatic.  Eyes: EOM are  normal. Pupils are equal, round, and reactive to light.  Neck: Normal range of motion. Neck supple. No thyromegaly present.  Cardiovascular: Normal heart sounds.   No murmur heard. Pulmonary/Chest: Breath sounds normal. No  respiratory distress.  Tachypneic at 20 resp/min. Using abd muscles. Wearing O2 [place] at 5 liters.  Abdominal: Soft. Bowel sounds are normal. There is no tenderness.  Musculoskeletal: Normal range of motion.  Neurological: He is alert and oriented to person, place, and time. He has normal reflexes.  Skin: Skin is warm. He is diaphoretic. No erythema.  Psychiatric: He has a normal mood and affect.    US Renal  Result Date: 03/05/2016 CLINICAL DATA:  Acute renal failure EXAM: RENAL / URINARY TRACT ULTRASOUND COMPLETE COMPARISON:  Chest CT 02/25/2016.  CT abdomen 07/16/2009 FINDINGS: Right Kidney: Length: 10.7 cm. Echogenicity within normal limits. No mass or hydronephrosis visualized. Left Kidney: Length: 12.2 cm. Previously seen left upper pole renal stone by chest CT not appreciated sonographically. Echogenicity within normal limits. No mass or hydronephrosis visualized. Bladder: Appears normal for degree of bladder distention. IMPRESSION: No acute findings.  No hydronephrosis. Electronically Signed   By: Rolm Baptise M.D.   On: 03/05/2016 11:32   Dg Chest Port 1 View  Result Date: 03/04/2016 CLINICAL DATA:  Shortness of breath and near syncope. History of leukemia EXAM: PORTABLE CHEST 1 VIEW COMPARISON:  Chest radiograph February 19, 2016 and chest CT February 25, 2016 FINDINGS: There is no edema or consolidation. Heart size and pulmonary vascularity are normal. No adenopathy. Port-A-Cath tip is at the cavoatrial junction. No pneumothorax. Postoperative changes noted on the left. IMPRESSION: No edema or consolidation.  No evident adenopathy. Electronically Signed   By: Lowella Grip III M.D.   On: 03/04/2016 13:55    Assessment & Plan:   Derek Shepherd was seen today for fatigue.  Diagnoses and all orders for this visit:  Blood loss anemia -     Fingerstick Hemoglobin  Chronic respiratory failure with hypoxia (HCC)  SOB (shortness of breath)  Controlled type 2 diabetes mellitus  without complication, without long-term current use of insulin (HCC)  Due to drop in hemoglobin,now 6.5,  this patient apparently has a significant anemia. There may be blood loss were maybe due to the low platelets or graft-versus-host disease. Regardless, due to his marked symptoms of shortness of breath he needs to have transfusion right away. I recommend 4 units of washed packed red blood cells with 2 units of platelets as well. He also should see hematology and try to determine the reason for his continued and recurrent drop of hemoglobin.   I have discontinued Mr. Goheen's potassium chloride and cephALEXin. I am also having him maintain his vitamin B-12, albuterol, metFORMIN, fluticasone, folic acid, montelukast, nefazodone, Sirolimus, budesonide-formoterol, lactulose, levalbuterol, multivitamin with minerals, rOPINIRole, tiotropium, Polyethyl Glycol-Propyl Glycol, acidophilus, senna, torsemide, and HYDROmorphone.  Allergies as of 04/06/2016      Reactions   Nsaids Shortness Of Breath      Medication List       Accurate as of 04/06/16  9:04 PM. Always use your most recent med list.          acidophilus Caps capsule Take 1 capsule by mouth daily.   albuterol (2.5 MG/3ML) 0.083% nebulizer solution Commonly known as:  PROVENTIL Take 3 mLs (2.5 mg total) by nebulization every 6 (six) hours as needed for wheezing or shortness of breath.   aMILoride 5 MG tablet Commonly known as:  MIDAMOR Take 1 tablet (5  mg total) by mouth daily.   budesonide-formoterol 160-4.5 MCG/ACT inhaler Commonly known as:  SYMBICORT Inhale 2 puffs into the lungs 2 (two) times daily.   citalopram 40 MG tablet Commonly known as:  CELEXA Take 40 mg by mouth daily.   escitalopram 10 MG tablet Commonly known as:  LEXAPRO Take 10 mg by mouth daily.   fluticasone 50 MCG/ACT nasal spray Commonly known as:  FLONASE Place 1 spray into both nostrils daily.   folic acid 1 MG tablet Commonly known as:   FOLVITE Take 1 tablet (1 mg total) by mouth daily.   HYDROmorphone 4 MG tablet Commonly known as:  DILAUDID Take 4 mg by mouth every 4 (four) hours as needed for moderate pain. Start taking on:  06/07/2016   lactulose 10 GM/15ML solution Commonly known as:  CHRONULAC Take 20 g by mouth 2 (two) times daily as needed for mild constipation.   levalbuterol 1.25 MG/0.5ML nebulizer solution Commonly known as:  XOPENEX Take 1.25 mg by nebulization every 6 (six) hours as needed for wheezing or shortness of breath.   metFORMIN 1000 MG tablet Commonly known as:  GLUCOPHAGE Take 1 tablet (1,000 mg total) by mouth 2 (two) times daily with a meal.   montelukast 10 MG tablet Commonly known as:  SINGULAIR Take 1 tablet (10 mg total) by mouth daily.   multivitamin with minerals Tabs tablet Take 1 tablet by mouth daily.   nefazodone 50 MG tablet Commonly known as:  SERZONE Take 1 tablet (50 mg total) by mouth at bedtime.   pantoprazole 40 MG tablet Commonly known as:  PROTONIX Take 1 tablet (40 mg total) by mouth 2 (two) times daily.   rOPINIRole 1 MG tablet Commonly known as:  REQUIP Take 1 mg by mouth at bedtime.   senna 8.6 MG tablet Commonly known as:  SENOKOT Take 2 tablets by mouth daily as needed for constipation.   Sirolimus 0.5 MG Tabs Take 1 tablet (0.5 mg total) by mouth daily.   SYSTANE 0.4-0.3 % Soln Generic drug:  Polyethyl Glycol-Propyl Glycol Place 1 drop into both eyes as needed (for dry eyes).   tiotropium 18 MCG inhalation capsule Commonly known as:  SPIRIVA Place 18 mcg into inhaler and inhale daily.   torsemide 100 MG tablet Commonly known as:  DEMADEX Take 1 tablet (100 mg total) by mouth daily.   vitamin B-12 1000 MCG tablet Commonly known as:  CYANOCOBALAMIN Take 1,000 mcg by mouth daily.      Case discussed by  phone with hospitalist. Refuses direct admission. Prefers pt. Be seen in E.D. Pt. Referred to E.D. At [facility].  Follow-up: Return  after hospital DC.  Claretta Fraise, M.D.

## 2016-04-06 NOTE — ED Provider Notes (Signed)
[place] DEPT Provider Note   CSN: [id number] Arrival date & time: 04/06/16 1942     History    Chief Complaint  Patient presents with  . Abnormal Lab     HPI Derek Shepherd is a 61 y.o. male.  61yo M w/ extensive PMH below including T cell lymphoma s/p BMT, CHF, COPD on 3L O2 who p/w anemia. Over the past one month the patient has been having recurrent issues with anemia requiring blood transfusion. Last blood transfusion was at [facility] approximately one week ago. He followed up with his regular doctor today and was sent here due to hemoglobin of 6.3 on lab work. He reports generalized fatigue and severe lightheadedness with standing and with any exertion. He reports mild worsening of chronic shortness of breath. No chest pain or abdominal pain. No bloody or black stools and no history of bleeding problems. He does note a 4-6 pound weight gain recently. No fevers. PCP discussed his case with [facility] hospitalist and patient was instructed to report to the ED.   Past Medical History:  Diagnosis Date  . Adenomatous colon polyp    tubular  . Anemia   . Anxiety   . Arthritis   . Asthma   . Bowel obstruction    constipation  . CHF (congestive heart failure) ([place])   . COPD (chronic obstructive pulmonary disease) ([place])   . Depression   . Diverticulosis   . DVT (deep venous thrombosis) ([place])   . Gallstones   . GERD (gastroesophageal reflux disease)   . 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 History of bone marrow transplant ([place])   . Leukemia-lymphoma, T-cell, acute, HTLV-I-associated ([place])   . Personal history of colonic  adenoma 01/22/2008     Patient Active Problem List   Diagnosis Date Noted  . AKI (acute kidney injury) ([place]) 03/04/2016  . Hyponatremia 03/04/2016  . Hypotension 03/04/2016  . Acute renal failure (ARF) ([place]) 03/04/2016  . Dehydration   . 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 Leukocytosis  11/24/2015  . Anemia of chronic disease 11/24/2015  . 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 Thrombocytopenia ([place]) 11/24/2015  . Depression 11/24/2015  . Chronic combined systolic and diastolic CHF (congestive heart failure) ([place]) 11/24/2015  . Controlled type 2 diabetes mellitus without complication, without long-term current use of insulin ([place]) 11/24/2015  . History of bone marrow transplant ([place])   . ALL (acute lymphoid leukemia) in remission ([place]) 03/20/2013    Past Surgical History:  Procedure Laterality Date  . BONE MARROW TRANSPLANT  2011  . CHOLECYSTECTOMY    . COLONOSCOPY W/ BIOPSIES    . EXPLORATORY LAPAROTOMY    . HERNIA REPAIR     x 2  . LUNG SURGERY Left         Home Medications    Prior to Admission medications   Medication Sig Start Date End Date Taking? Authorizing Provider  acidophilus (RISAQUAD) CAPS capsule Take 1 capsule by mouth daily.   Yes Historical Provider, MD  albuterol (PROVENTIL) (2.5 MG/3ML) 0.083% nebulizer solution Take 3 mLs (2.5 mg total) by nebulization every 6 (six) hours as needed for wheezing or shortness of breath. 05/24/14  Yes Claretta Fraise, MD  budesonide-formoterol [facility]) 160-4.5 MCG/ACT inhaler Inhale 2 puffs into the lungs 2 (two) times daily.    Yes Historical Provider, MD  citalopram (CELEXA) 40 MG tablet Take 40 mg by mouth daily. 03/17/16  Yes Historical Provider, MD  fluticasone Asencion Islam)  50 MCG/ACT nasal spray Place 1 spray into both nostrils daily. 02/04/16  Yes Claretta Fraise, MD  folic acid (FOLVITE) 1 MG tablet Take 1 tablet (1 mg total) by mouth daily. 02/04/16  Yes Claretta Fraise, MD  HYDROmorphone (DILAUDID) 4 MG tablet Take 4 mg by mouth every 4 (four) hours as needed for moderate pain.  06/07/16 07/07/16 Yes Historical Provider, MD  lactulose (CHRONULAC) 10 GM/15ML solution Take 20 g by mouth 2 (two) times daily as needed for mild constipation.   Yes Historical Provider, MD  levalbuterol (XOPENEX) 1.25 MG/0.5ML  nebulizer solution Take 1.25 mg by nebulization every 6 (six) hours as needed for wheezing or shortness of breath.   Yes Historical Provider, MD  metFORMIN (GLUCOPHAGE) 1000 MG tablet Take 1 tablet (1,000 mg total) by mouth 2 (two) times daily with a meal. 12/29/15  Yes Tammy Eckard, PharmD  montelukast (SINGULAIR) 10 MG tablet Take 1 tablet (10 mg total) by mouth daily. 02/04/16  Yes Claretta Fraise, MD  Multiple Vitamin (MULTIVITAMIN WITH MINERALS) TABS tablet Take 1 tablet by mouth daily.   Yes Historical Provider, MD  nefazodone (SERZONE) 50 MG tablet Take 1 tablet (50 mg total) by mouth at bedtime. 02/04/16  Yes Claretta Fraise, MD  Polyethyl Glycol-Propyl Glycol (SYSTANE) 0.4-0.3 % SOLN Place 1 drop into both eyes as needed (for dry eyes).   Yes Historical Provider, MD  rOPINIRole (REQUIP) 1 MG tablet Take 1 mg by mouth at bedtime.   Yes Historical Provider, MD  senna (SENOKOT) 8.6 MG tablet Take 2 tablets by mouth daily as needed for constipation.   Yes Historical Provider, MD  Sirolimus 0.5 MG TABS Take 1 tablet (0.5 mg total) by mouth daily. 02/04/16  Yes Claretta Fraise, MD  tiotropium (SPIRIVA) 18 MCG inhalation capsule Place 18 mcg into inhaler and inhale daily.   Yes Historical Provider, MD  torsemide (DEMADEX) 100 MG tablet Take 1 tablet (100 mg total) by mouth daily. Patient taking differently: Take 50 mg by mouth daily.  03/06/16 03/06/17 Yes Estela Leonie Green, MD  vitamin B-12 (CYANOCOBALAMIN) 1000 MCG tablet Take 1,000 mcg by mouth daily.    Yes Historical Provider, MD      Family History  Problem Relation Age of Onset  . Clotting disorder Mother   . Heart attack Mother   . Diabetes Father   . Heart attack Father   . Prostate cancer Brother   . Diabetes Brother     x 3  . Diabetes Sister     x 3  . Diabetes Maternal Aunt      Social History  Substance Use Topics  . Smoking status: Former Smoker    Types: Cigarettes    Quit date: 07/13/1993  . Smokeless tobacco:  Never Used  . Alcohol use 0.0 oz/week     Comment: socially     Allergies     Nsaids    Review of Systems  10 Systems reviewed and are negative for acute change except as noted in the HPI.   Physical Exam Updated Vital Signs BP 106/68   Pulse 84   Temp 99 F (37.2 C) (Oral)   Resp 19   SpO2 100%   Physical Exam  Constitutional: He is oriented to person, place, and time. He appears well-developed and well-nourished. No distress.  HENT:  Head: Normocephalic and atraumatic.  Moist mucous membranes  Eyes: Conjunctivae are normal. Pupils are equal, round, and reactive to light.  Neck: Neck supple.  Cardiovascular: Normal  rate, regular rhythm and normal heart sounds.   No murmur heard. Pulmonary/Chest: No respiratory distress.  Mildly increased WOB, mildly diminished BS R upper chest port  Abdominal: Soft. Bowel sounds are normal. He exhibits no distension. There is no tenderness.  Musculoskeletal: He exhibits no edema.  Neurological: He is alert and oriented to person, place, and time.  Fluent speech  Skin: Skin is warm and dry. There is pallor.  Psychiatric: He has a normal mood and affect. Judgment normal.  Nursing note and vitals reviewed.     ED Treatments / Results  Labs (all labs ordered are listed, but only abnormal results are displayed) Labs Reviewed  COMPREHENSIVE METABOLIC PANEL - Abnormal; Notable for the following:       Result Value   Chloride 92 (*)    Glucose, Bld 128 (*)    Total Protein 6.0 (*)    ALT 16 (*)    All other components within normal limits  CBC - Abnormal; Notable for the following:    RBC 2.25 (*)    Hemoglobin 6.9 (*)    HCT 22.6 (*)    MCV 100.4 (*)    RDW 18.0 (*)    Platelets 64 (*)    All other components within normal limits  TROPONIN I  BRAIN NATRIURETIC PEPTIDE  TYPE AND SCREEN  PREPARE RBC (CROSSMATCH)  PREPARE RBC (CROSSMATCH)     EKG  EKG Interpretation  Date/Time:  Wednesday April 06 2016 22:35:34  EST Ventricular Rate:  84 PR Interval:    QRS Duration: 107 QT Interval:  391 QTC Calculation: 463 R Axis:   25 Text Interpretation:  Sinus rhythm tachycardia improved from previous, no other significant changes Confirmed by Blakley Michna MD, Dejohn Ibarra [id number]) on 04/06/2016 10:38:41 PM         Radiology No results found.  Procedures Procedures (including critical care time) .Critical Care Performed by: Sharlett Iles Authorized by: Sharlett Iles   Critical care provider statement:    Critical care time (minutes):  40   Critical care time was exclusive of:  Separately billable procedures and treating other patients   Critical care was necessary to treat or prevent imminent or life-threatening deterioration of the following conditions: symptomatic anemia.   Critical care was time spent personally by me on the following activities:  Development of treatment plan with patient or surrogate, obtaining history from patient or surrogate, ordering and performing treatments and interventions, ordering and review of laboratory studies, ordering and review of radiographic studies, re-evaluation of patient's condition and review of old charts    Medications Ordered in ED  Medications  0.9 %  sodium chloride infusion (10 mL/hr Intravenous New Bag/Given 04/06/16 2235)  0.9 %  sodium chloride infusion (10 mL/hr Intravenous New Bag/Given 04/06/16 2235)     Initial Impression / Assessment and Plan / ED Course  I have reviewed the triage vital signs and the nursing notes.  Pertinent labs & imaging results that were available during my care of the patient were reviewed by me and considered in my medical decision making (see chart for details).     Pt w/ h/o leukemia s/p BMT not currently on chemo who p/w anemia associated w/ fatigue and lightheadedness. He was nontoxic on exam. Initial BP at triage was 87/58, was improved without intervention to 106/68 on my exam. Normal heart rate and  afebrile. No abdominal tenderness. No respiratory distress. After discussing risks and benefits of blood transfusion, I obtained consent to transfuse RBCs  and ordered 2 units based on hemoglobin of 6.9. Pt states his stool has been tested for blood in the past and his doctors think the anemia is related to his bone marrow rather than GI bleed. The rest of his lab work shows normal creatinine, normal troponin. I did add chest x-ray and BNP given report of weight gain and history of CHF. Discussed admission with [facility] hospitalist, Dr. Loleta Books, and pt admitted for further care.  Final Clinical Impressions(s) / ED Diagnoses   Final diagnoses:  Symptomatic anemia  Weakness  Lightheadedness     New Prescriptions   No medications on file       Sharlett Iles, MD 04/07/16 [phone]

## 2016-04-06 NOTE — ED Triage Notes (Signed)
Pt reports his PCP called him today and told him to come to hospital because his hgb was low of 6.3. He reports feeling more tired and gets lightheadedness with exertion.  He wears 3L of 02 at home for COPD.

## 2016-04-07 ENCOUNTER — Encounter (HOSPITAL_COMMUNITY): Payer: Self-pay | Admitting: Family Medicine

## 2016-04-07 DIAGNOSIS — D649 Anemia, unspecified: Secondary | ICD-10-CM

## 2016-04-07 DIAGNOSIS — I2782 Chronic pulmonary embolism: Secondary | ICD-10-CM | POA: Diagnosis not present

## 2016-04-07 DIAGNOSIS — I5042 Chronic combined systolic (congestive) and diastolic (congestive) heart failure: Secondary | ICD-10-CM | POA: Diagnosis not present

## 2016-04-07 DIAGNOSIS — C9101 Acute lymphoblastic leukemia, in remission: Secondary | ICD-10-CM | POA: Diagnosis not present

## 2016-04-07 DIAGNOSIS — Z9481 Bone marrow transplant status: Secondary | ICD-10-CM

## 2016-04-07 DIAGNOSIS — E119 Type 2 diabetes mellitus without complications: Secondary | ICD-10-CM

## 2016-04-07 DIAGNOSIS — J431 Panlobular emphysema: Secondary | ICD-10-CM

## 2016-04-07 LAB — CBC
HEMATOCRIT: 27.2 % — AB (ref 39.0–52.0)
HEMOGLOBIN: 8.9 g/dL — AB (ref 13.0–17.0)
MCH: 30.9 pg (ref 26.0–34.0)
MCHC: 32.7 g/dL (ref 30.0–36.0)
MCV: 94.4 fL (ref 78.0–100.0)
Platelets: 54 10*3/uL — ABNORMAL LOW (ref 150–400)
RBC: 2.88 MIL/uL — AB (ref 4.22–5.81)
RDW: 19.5 % — ABNORMAL HIGH (ref 11.5–15.5)
WBC: 5.6 10*3/uL (ref 4.0–10.5)

## 2016-04-07 LAB — BRAIN NATRIURETIC PEPTIDE: B Natriuretic Peptide: 44.4 pg/mL (ref 0.0–100.0)

## 2016-04-07 LAB — GLUCOSE, CAPILLARY
GLUCOSE-CAPILLARY: 115 mg/dL — AB (ref 65–99)
GLUCOSE-CAPILLARY: 158 mg/dL — AB (ref 65–99)
GLUCOSE-CAPILLARY: 137 mg/dL — AB (ref 65–99)
Glucose-Capillary: 115 mg/dL — ABNORMAL HIGH (ref 65–99)

## 2016-04-07 MED ORDER — LEVALBUTEROL HCL 1.25 MG/0.5ML IN NEBU
1.2500 mg | INHALATION_SOLUTION | Freq: Four times a day (QID) | RESPIRATORY_TRACT | Status: DC | PRN
  Filled 2016-04-07: qty 0.5

## 2016-04-07 MED ORDER — LEVALBUTEROL HCL 0.63 MG/3ML IN NEBU
INHALATION_SOLUTION | RESPIRATORY_TRACT | Status: AC
  Filled 2016-04-07: qty 6

## 2016-04-07 MED ORDER — ROPINIROLE HCL 1 MG PO TABS
1.0000 mg | ORAL_TABLET | Freq: Every day | ORAL | Status: DC
  Administered 2016-04-07: 1 mg via ORAL
  Filled 2016-04-07: qty 1

## 2016-04-07 MED ORDER — ONDANSETRON HCL 4 MG/2ML IJ SOLN: 4.0000 mg | Freq: Four times a day (QID) | INTRAMUSCULAR | Status: DC | PRN

## 2016-04-07 MED ORDER — ONDANSETRON HCL 4 MG PO TABS: 4.0000 mg | ORAL_TABLET | Freq: Four times a day (QID) | ORAL | Status: DC | PRN

## 2016-04-07 MED ORDER — FLUTICASONE PROPIONATE 50 MCG/ACT NA SUSP
1.0000 | Freq: Every day | NASAL | Status: DC
Start: 2016-04-07 — End: 2016-04-08
  Administered 2016-04-07 – 2016-04-08 (×2): 1 via NASAL
  Filled 2016-04-07: qty 16

## 2016-04-07 MED ORDER — SIROLIMUS 0.5 MG PO TABS
0.5000 mg | ORAL_TABLET | Freq: Every day | ORAL | Status: DC
Start: 2016-04-07 — End: 2016-04-08
  Administered 2016-04-07: 0.5 mg via ORAL
  Filled 2016-04-07 (×2): qty 1

## 2016-04-07 MED ORDER — ACETAMINOPHEN 650 MG RE SUPP: 650.0000 mg | Freq: Four times a day (QID) | RECTAL | Status: DC | PRN

## 2016-04-07 MED ORDER — NEFAZODONE HCL 50 MG PO TABS
50.0000 mg | ORAL_TABLET | Freq: Every day | ORAL | Status: DC
  Administered 2016-04-07: 50 mg via ORAL
  Filled 2016-04-07: qty 1

## 2016-04-07 MED ORDER — CITALOPRAM HYDROBROMIDE 40 MG PO TABS
40.0000 mg | ORAL_TABLET | Freq: Every day | ORAL | Status: DC
  Administered 2016-04-07 – 2016-04-08 (×2): 40 mg via ORAL
  Filled 2016-04-07 (×2): qty 1

## 2016-04-07 MED ORDER — FOLIC ACID 1 MG PO TABS
1.0000 mg | ORAL_TABLET | Freq: Every day | ORAL | Status: DC
  Administered 2016-04-07 – 2016-04-08 (×2): 1 mg via ORAL
  Filled 2016-04-07 (×2): qty 1

## 2016-04-07 MED ORDER — INSULIN ASPART 100 UNIT/ML ~~LOC~~ SOLN
0.0000 [IU] | Freq: Three times a day (TID) | SUBCUTANEOUS | Status: DC
  Administered 2016-04-07: 2 [IU] via SUBCUTANEOUS

## 2016-04-07 MED ORDER — LEVALBUTEROL HCL 0.63 MG/3ML IN NEBU
INHALATION_SOLUTION | RESPIRATORY_TRACT | Status: AC
  Administered 2016-04-07: 1.26 mg
  Filled 2016-04-07: qty 3

## 2016-04-07 MED ORDER — ACETAMINOPHEN 325 MG PO TABS: 650.0000 mg | ORAL_TABLET | Freq: Four times a day (QID) | ORAL | Status: DC | PRN

## 2016-04-07 MED ORDER — SODIUM CHLORIDE 0.9 % IV SOLN
Freq: Once | INTRAVENOUS | Status: AC
  Administered 2016-04-07: 04:00:00 via INTRAVENOUS

## 2016-04-07 MED ORDER — TORSEMIDE 20 MG PO TABS
50.0000 mg | ORAL_TABLET | Freq: Every day | ORAL | Status: DC
  Administered 2016-04-07 – 2016-04-08 (×2): 50 mg via ORAL
  Filled 2016-04-07 (×2): qty 3

## 2016-04-07 MED ORDER — TIOTROPIUM BROMIDE MONOHYDRATE 18 MCG IN CAPS
18.0000 ug | ORAL_CAPSULE | Freq: Every day | RESPIRATORY_TRACT | Status: DC
  Administered 2016-04-07 – 2016-04-08 (×2): 18 ug via RESPIRATORY_TRACT
  Filled 2016-04-07: qty 5

## 2016-04-07 MED ORDER — INSULIN ASPART 100 UNIT/ML ~~LOC~~ SOLN: 0.0000 [IU] | Freq: Every day | SUBCUTANEOUS | Status: DC

## 2016-04-07 MED ORDER — SODIUM CHLORIDE 0.9% FLUSH
10.0000 mL | Freq: Two times a day (BID) | INTRAVENOUS | Status: DC
Start: 2016-04-07 — End: 2016-04-08
  Administered 2016-04-08: 10 mL

## 2016-04-07 MED ORDER — MOMETASONE FURO-FORMOTEROL FUM 200-5 MCG/ACT IN AERO
2.0000 | INHALATION_SPRAY | Freq: Two times a day (BID) | RESPIRATORY_TRACT | Status: DC
  Administered 2016-04-07 – 2016-04-08 (×3): 2 via RESPIRATORY_TRACT
  Filled 2016-04-07: qty 8.8

## 2016-04-07 MED ORDER — SODIUM CHLORIDE 0.9% FLUSH
10.0000 mL | INTRAVENOUS | Status: DC | PRN
  Administered 2016-04-07 – 2016-04-08 (×2): 10 mL
  Filled 2016-04-07: qty 40

## 2016-04-07 MED ORDER — METFORMIN HCL 500 MG PO TABS
1000.0000 mg | ORAL_TABLET | Freq: Two times a day (BID) | ORAL | Status: DC
  Administered 2016-04-07 – 2016-04-08 (×3): 1000 mg via ORAL
  Filled 2016-04-07 (×3): qty 2

## 2016-04-07 MED ORDER — MONTELUKAST SODIUM 10 MG PO TABS
10.0000 mg | ORAL_TABLET | Freq: Every day | ORAL | Status: DC
  Administered 2016-04-07: 10 mg via ORAL
  Filled 2016-04-07: qty 1

## 2016-04-07 MED ORDER — RISAQUAD PO CAPS
1.0000 | ORAL_CAPSULE | Freq: Every day | ORAL | Status: DC
  Administered 2016-04-07 – 2016-04-08 (×2): 1 via ORAL
  Filled 2016-04-07 (×2): qty 1

## 2016-04-07 NOTE — ED Notes (Signed)
Faxed demographic sheet to [facility]

## 2016-04-07 NOTE — ED Notes (Signed)
Graham crackers and milk provided

## 2016-04-07 NOTE — ED Notes (Signed)
Paged [facility] line for Hematology-oncology consult to Dr. Loleta Books

## 2016-04-07 NOTE — H&P (Signed)
History and Physical  Patient Name: Derek Shepherd     [id number]    DOB: Feb 16, 1956    DOA: 04/06/2016 PCP: Claretta Fraise, MD   Patient coming from: Home --> PCP's office  Chief Complaint: Dyspnea  HPI: Derek Shepherd is a 61 y.o. male with a past medical history significant for ALL s/p BMT, DM, CHF EF 35%, chronic PE and emphysema on home O2, and recent anemia/thrombocytopenia who presents with fatigue.  Over about the last 6-9 months, the patient has developed transfusion dependent anemia and thrombocytopenia (baseline platelets around 40K per Heme-Onc notes).  He had a BM biopsy in Nov that showed hypocellularity, no leukemia, and etiology remained unclear.  In the last month, he had a transfusion about 2 weeks ago at [facility] of 2 units (he describes this and his Heme-Onc notes do, but I don't see any transfusions in our system since Jan 04 2016), then again 1 week ago when he saw his Heme-Onc at [facility] of 1 additional unit.  Each time he needs a transfusion, he gets weak, short of breath and tired with exertion and dizzy with standing.  When he got the two units, he felt considerably better the day aftewards until he gradually got weaker and more SOB, then he got transfused again 1 week ago, and felt only a little better, and within a few days was weak and tired again.  For the last 3-4 days, he has been dizzy with standing, weak, tired with exertion and SOB.  Today he went to his PCP who referred him to the hospital for transfusion after a fingerstick Hgb came back 6.5 g/dL.  He has had no epistaxis, gum bleeding, hematochezia, hemoptysis, hematemesis, bruising.    ED course: -Temp 103F, heart rate 91, respirations and pulse ox normal on home O2, BP 97/55 (baseline) -Na 135, K 3.5, Cr 1.15 (baseline 1.1), WBC 5.1K, Hgb 6.9 -Platelets 64K -Troponin negative -CXR clear -He was typed and screened and irradiated blood was requested from [facility] and [place] were asked to observe until he could be  transfused     ROS: Review of Systems  Constitutional: Positive for malaise/fatigue. Negative for chills and fever.  Respiratory: Positive for shortness of breath. Negative for cough, hemoptysis, sputum production and wheezing.   Cardiovascular: Negative for chest pain, orthopnea, leg swelling and PND.  Neurological: Positive for weakness. Negative for loss of consciousness.  All other systems reviewed and are negative.         Past Medical History:  Diagnosis Date  . Adenomatous colon polyp    tubular  . Anemia   . Anxiety   . Arthritis   . Asthma   . Bowel obstruction    constipation  . CHF (congestive heart failure) ([place])   . COPD (chronic obstructive pulmonary disease) ([place])   . Depression   . Diverticulosis   . DVT (deep venous thrombosis) ([place])   . Gallstones   . GERD (gastroesophageal reflux disease)   . 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 History of bone marrow transplant ([place])   . Leukemia-lymphoma, T-cell, acute, HTLV-I-associated ([place])   . Personal history of colonic  adenoma 01/22/2008    Past Surgical History:  Procedure Laterality Date  . BONE MARROW TRANSPLANT  2011  . CHOLECYSTECTOMY    . COLONOSCOPY W/ BIOPSIES    . EXPLORATORY LAPAROTOMY    . HERNIA REPAIR     x 2  . LUNG SURGERY Left  Social History: Patient lives with his wife.  The patient walks unassisted.  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  He used to smoke.  He does not use alcohol.  He is .   reports that he quit smoking about 22 years ago. His smoking use included Cigarettes. He has never used smokeless tobacco. He reports that he drinks alcohol. He reports that he does not use drugs.  Allergies  Allergen Reactions  . Nsaids Shortness Of Breath    Family history: family history includes Clotting disorder in his mother; Diabetes in his brother, father, maternal aunt, and sister; Heart attack in his father and  mother; Prostate cancer in his brother.  Prior to Admission medications   Medication Sig Start Date End Date Taking? Authorizing Provider  acidophilus (RISAQUAD) CAPS capsule Take 1 capsule by mouth daily.   Yes Historical Provider, MD  albuterol (PROVENTIL) (2.5 MG/3ML) 0.083% nebulizer solution Take 3 mLs (2.5 mg total) by nebulization every 6 (six) hours as needed for wheezing or shortness of breath. 05/24/14  Yes Claretta Fraise, MD  budesonide-formoterol [facility]) 160-4.5 MCG/ACT inhaler Inhale 2 puffs into the lungs 2 (two) times daily.    Yes Historical Provider, MD  citalopram (CELEXA) 40 MG tablet Take 40 mg by mouth daily. 03/17/16  Yes Historical Provider, MD  fluticasone (FLONASE) 50 MCG/ACT nasal spray Place 1 spray into both nostrils daily. 02/04/16  Yes Claretta Fraise, MD  folic acid (FOLVITE) 1 MG tablet Take 1 tablet (1 mg total) by mouth daily. 02/04/16  Yes Claretta Fraise, MD  HYDROmorphone (DILAUDID) 4 MG tablet Take 4 mg by mouth every 4 (four) hours as needed for moderate pain.  06/07/16 07/07/16 Yes Historical Provider, MD  lactulose (CHRONULAC) 10 GM/15ML solution Take 20 g by mouth 2 (two) times daily as needed for mild constipation.   Yes Historical Provider, MD  levalbuterol (XOPENEX) 1.25 MG/0.5ML nebulizer solution Take 1.25 mg by nebulization every 6 (six) hours as needed for wheezing or shortness of breath.   Yes Historical Provider, MD  metFORMIN (GLUCOPHAGE) 1000 MG tablet Take 1 tablet (1,000 mg total) by mouth 2 (two) times daily with a meal. 12/29/15  Yes Tammy Eckard, PharmD  montelukast (SINGULAIR) 10 MG tablet Take 1 tablet (10 mg total) by mouth daily. 02/04/16  Yes Claretta Fraise, MD  Multiple Vitamin (MULTIVITAMIN WITH MINERALS) TABS tablet Take 1 tablet by mouth daily.   Yes Historical Provider, MD  nefazodone (SERZONE) 50 MG tablet Take 1 tablet (50 mg total) by mouth at bedtime. 02/04/16  Yes Claretta Fraise, MD  Polyethyl Glycol-Propyl Glycol (SYSTANE) 0.4-0.3 %  SOLN Place 1 drop into both eyes as needed (for dry eyes).   Yes Historical Provider, MD  rOPINIRole (REQUIP) 1 MG tablet Take 1 mg by mouth at bedtime.   Yes Historical Provider, MD  senna (SENOKOT) 8.6 MG tablet Take 2 tablets by mouth daily as needed for constipation.   Yes Historical Provider, MD  Sirolimus 0.5 MG TABS Take 1 tablet (0.5 mg total) by mouth daily. 02/04/16  Yes Claretta Fraise, MD  tiotropium (SPIRIVA) 18 MCG inhalation capsule Place 18 mcg into inhaler and inhale daily.   Yes Historical Provider, MD  torsemide (DEMADEX) 100 MG tablet Take 1 tablet (100 mg total) by mouth daily. Patient taking differently: Take 50 mg by mouth daily.  03/06/16 03/06/17 Yes Estela Leonie Green, MD  vitamin B-12 (CYANOCOBALAMIN) 1000 MCG tablet Take 1,000 mcg by mouth daily.  Yes Historical Provider, MD       Physical Exam: BP 97/70   Pulse 83   Temp 99 F (37.2 C) (Oral)   Resp 17   SpO2 99%  General appearance: Well-developed, but chronically ill appearing adult male, alert and in no acute distress.   Eyes: Anicteric, conjunctiva pink, lids and lashes normal. PERRL.    ENT: No nasal deformity, discharge, epistaxis.  Hearing normal. OP moist without lesions.   Neck: No neck masses.  Trachea midline.  No thyromegaly/tenderness. Lymph: No cervical or supraclavicular lymphadenopathy. Skin: Warm and dry.  Pale.  No jaundice.  No suspicious rashes or lesions. Cardiac: RRR, nl S1-S2, no murmurs appreciated.  Capillary refill is brisk.  JVP normal.  No LE edema.  Radial and DP pulses 2+ and symmetric. Respiratory: Normal respiratory rate and rhythm.  CTAB without rales or wheezes. Abdomen: Abdomen soft.  No TTP. No ascites, distension, hepatosplenomegaly.   MSK: No deformities or effusions.  No cyanosis or clubbing. Neuro: Cranial nerves normal.  Sensation intact to light touch. Speech is fluent.  Muscle strength normal.    Psych: Sensorium intact and responding to questions, attention  normal.  Behavior appropriate.  Affect normal.  Judgment and insight appear normal.     Labs on Admission:  I have personally reviewed following labs and imaging studies: CBC:  Recent Labs Lab 04/06/16 2022  WBC 5.1  HGB 6.9*  HCT 22.6*  MCV 100.4*  PLT 64*   Basic Metabolic Panel:  Recent Labs Lab 04/06/16 2022  NA 135  K 3.5  CL 92*  CO2 32  GLUCOSE 128*  BUN 19  CREATININE 1.15  CALCIUM 9.6   GFR: Estimated Creatinine Clearance: 79.9 mL/min (by C-G formula based on SCr of 1.15 mg/dL).  Liver Function Tests:  Recent Labs Lab 04/06/16 2022  AST 21  ALT 16*  ALKPHOS 56  BILITOT 0.4  PROT 6.0*  ALBUMIN 3.6   No results for input(s): LIPASE, AMYLASE in the last 168 hours. No results for input(s): AMMONIA in the last 168 hours. Coagulation Profile: No results for input(s): INR, PROTIME in the last 168 hours. Cardiac Enzymes:  Recent Labs Lab 04/06/16 2022  TROPONINI <0.03   BNP (last 3 results) No results for input(s): PROBNP in the last 8760 hours. HbA1C: No results for input(s): HGBA1C in the last 72 hours. CBG: No results for input(s): GLUCAP in the last 168 hours. Lipid Profile: No results for input(s): CHOL, HDL, LDLCALC, TRIG, CHOLHDL, LDLDIRECT in the last 72 hours. Thyroid Function Tests: No results for input(s): TSH, T4TOTAL, FREET4, T3FREE, THYROIDAB in the last 72 hours. Anemia Panel: No results for input(s): VITAMINB12, FOLATE, FERRITIN, TIBC, IRON, RETICCTPCT in the last 72 hours. Sepsis Labs:  Invalid input(s): PROCALCITONIN, LACTICIDVEN No results found for this or any previous visit (from the past 240 hour(s)).       Radiological Exams on Admission: Personally reviewed: Dg Chest Port 1 View  Result Date: 04/06/2016 CLINICAL DATA:  Dyspnea on exertion.  Anemia.  History of CHF. EXAM: PORTABLE CHEST 1 VIEW COMPARISON:  Portable chest radiograph 03/04/2016, CT 02/25/2016 FINDINGS: Tip of the right chest port in the SVC.  Unchanged heart size and mediastinal contours. No evidence of pulmonary edema, focal airspace disease, pleural fluid or pneumothorax. Multiple cerclage wires involving left ribs. IMPRESSION: No acute abnormality or change from prior exam. Electronically Signed   By: Jeb Levering M.D.   On: 04/06/2016 22:44    EKG: Independently reviewed. Rate  84, QTc normal, no ST changes.  Echo Oct 2016: EF 30-35% Mild LVH Grade II DD    Assessment/Plan  1. Symptomatic anemia:  Discussed with Heme[facility]Onc at [facility].  Presumed from recent myelosuppression, seems to be accelerating in need for transfusions. -Transfuse 2 units now -If symptoms improved after transfusion, likely home after with outpatient follow up with his Hematologist at [facility]   2. ALL s/p BMT with chronicGVHD:  -Continue Sirolimus  3. Chronic systolic and diastolic CHF:  Appear euvolemic.  No edema on CXR.  No orthopnea, PND. -Continue home torsemide  4. COPD and chronic PE:  -Continue home O2 -Continue inhalers -Continue Flonase and Singulair  5. Other medications:  -Continue Requip, Serzone, citalopram  6. Diabetes:  -Continue metformin        DVT prophylaxis: SCDs  Code Status: FULl  Family Communication: None present  Disposition Plan: Anticipate transfusion overnight and if symptoms improved in AM, likely home tomorrow. Consults called: Heme[facility]Onc at [facility] Admission status: OBS At the point of initial evaluation, it is my clinical opinion that admission for OBSERVATION is reasonable and necessary because the patient's presenting complaints in the context of their chronic conditions represent sufficient risk of deterioration or significant morbidity to constitute reasonable grounds for close observation in the hospital setting, but that the patient may be medically stable for discharge from the hospital within 24 to 48 hours.    Medical decision making: Patient seen at 12:10 AM on 04/07/2016.  The patient was discussed  with Dr. Lissa Merlin.  What exists of the patient's chart was reviewed in depth and summarized above.  Clinical condition: stable.        Edwin Dada [facility] Pager [phone]

## 2016-04-07 NOTE — Progress Notes (Signed)
Patient admitted after midnight. For details please refer to admission note done 04/07/2016.  Patient admitted for symptomatic anemia. He has received 2 units of PRBC transfusion and his posttransfusion hemoglobin is 8.9, stable. Patient overall stable and will let us know after lunchtime is he feels he is stable to go home.  Continue to monitor.  Leisa Lenz [facility] [id number]

## 2016-04-07 NOTE — Care Management Obs Status (Signed)
[place] NOTIFICATION   Patient Details  Name: Derek Shepherd MRN: [id number] Date of Birth: 15-Feb-1956   Medicare Observation Status Notification Given:  Yes    Ninfa Meeker, RN 04/07/2016, 11:39 AM

## 2016-04-08 DIAGNOSIS — D649 Anemia, unspecified: Secondary | ICD-10-CM | POA: Diagnosis not present

## 2016-04-08 DIAGNOSIS — C9101 Acute lymphoblastic leukemia, in remission: Secondary | ICD-10-CM

## 2016-04-08 LAB — CBC
HCT: 29 % — ABNORMAL LOW (ref 39.0–52.0)
Hemoglobin: 9.5 g/dL — ABNORMAL LOW (ref 13.0–17.0)
MCH: 31 pg (ref 26.0–34.0)
MCHC: 32.8 g/dL (ref 30.0–36.0)
MCV: 94.8 fL (ref 78.0–100.0)
PLATELETS: 55 10*3/uL — AB (ref 150–400)
RBC: 3.06 MIL/uL — ABNORMAL LOW (ref 4.22–5.81)
RDW: 19.5 % — AB (ref 11.5–15.5)
WBC: 5.2 10*3/uL (ref 4.0–10.5)

## 2016-04-08 LAB — TYPE AND SCREEN
BLOOD PRODUCT EXPIRATION DATE: 201802092359
BLOOD PRODUCT EXPIRATION DATE: 201802172359
Blood Product Expiration Date: 201802182359
Blood Product Expiration Date: 201802202359
ISSUE DATE / TIME: 201801240927
ISSUE DATE / TIME: 201802010139
ISSUE DATE / TIME: 201802010354
UNIT TYPE AND RH: 6200
Unit Type and Rh: 6200
Unit Type and Rh: 6200
Unit Type and Rh: 6200

## 2016-04-08 LAB — GLUCOSE, CAPILLARY
GLUCOSE-CAPILLARY: 129 mg/dL — AB (ref 65–99)
Glucose-Capillary: 99 mg/dL (ref 65–99)

## 2016-04-08 MED ORDER — HEPARIN SOD (PORK) LOCK FLUSH 100 UNIT/ML IV SOLN
500.0000 [IU] | INTRAVENOUS | Status: AC | PRN
  Administered 2016-04-08: 500 [IU]

## 2016-04-08 MED ORDER — LOPERAMIDE HCL 2 MG PO CAPS
2.0000 mg | ORAL_CAPSULE | Freq: Four times a day (QID) | ORAL | Status: DC | PRN
  Administered 2016-04-08: 2 mg via ORAL
  Filled 2016-04-08: qty 1

## 2016-04-08 NOTE — Progress Notes (Signed)
Patient discharged to home with family.

## 2016-04-08 NOTE — Discharge Instructions (Signed)
Anemia, Nonspecific Anemia is a condition in which the concentration of red blood cells or hemoglobin in the blood is below normal. Hemoglobin is a substance in red blood cells that carries oxygen to the tissues of the body. Anemia results in not enough oxygen reaching these tissues. What are the causes? Common causes of anemia include:  Excessive bleeding. Bleeding may be internal or external. This includes excessive bleeding from periods (in women) or from the intestine.  Poor nutrition.  Chronic kidney, thyroid, and liver disease.  Bone marrow disorders that decrease red blood cell production.  Cancer and treatments for cancer.  HIV, AIDS, and their treatments.  Spleen problems that increase red blood cell destruction.  Blood disorders.  Excess destruction of red blood cells due to infection, medicines, and autoimmune disorders. What are the signs or symptoms?  Minor weakness.  Dizziness.  Headache.  Palpitations.  Shortness of breath, especially with exercise.  Paleness.  Cold sensitivity.  Indigestion.  Nausea.  Difficulty sleeping.  Difficulty concentrating. Symptoms may occur suddenly or they may develop slowly. How is this diagnosed? Additional blood tests are often needed. These help your health care provider determine the best treatment. Your health care provider will check your stool for blood and look for other causes of blood loss. How is this treated? Treatment varies depending on the cause of the anemia. Treatment can include:  Supplements of iron, vitamin 123456, or folic acid.  Hormone medicines.  A blood transfusion. This may be needed if blood loss is severe.  Hospitalization. This may be needed if there is significant continual blood loss.  Dietary changes.  Spleen removal. Follow these instructions at home: Keep all follow-up appointments. It often takes many weeks to correct anemia, and having your health care provider check on your  condition and your response to treatment is very important. Get help right away if:  You develop extreme weakness, shortness of breath, or chest pain.  You become dizzy or have trouble concentrating.  You develop heavy vaginal bleeding.  You develop a rash.  You have bloody or black, tarry stools.  You faint.  You vomit up blood.  You vomit repeatedly.  You have abdominal pain.  You have a fever or persistent symptoms for more than 2-3 days.  You have a fever and your symptoms suddenly get worse.  You are dehydrated. This information is not intended to replace advice given to you by your health care provider. Make sure you discuss any questions you have with your health care provider. Document Released: 03/31/2004 Document Revised: 08/05/2015 Document Reviewed: 08/17/2012 Elsevier Interactive Patient Education  2017 [employer]  Thrombocytopenia Thrombocytopenia means that you have a low number of platelets in your blood. Platelets are tiny cells in the blood. When you bleed, they clump together at the cut or injury to stop the bleeding. This is called blood clotting. Not having enough platelets can cause bleeding problems. Follow these instructions at home: General instructions  Check your skin and inside your mouth for bruises or blood as told by your doctor.  Check to see if there is blood in your spit (sputum), pee (urine), and poop (stool). Do this as told by your doctor.  Ask your doctor if you can drink alcohol.  Take over-the-counter and prescription medicines only as told by your doctor.  Tell all of your doctors that you have this condition. Be sure to tell your dentist and eye doctor too. Activity  Do not do activities that can cause bumps  or bruises until your doctor says it is okay.  Be careful not to cut yourself:  When you shave.  When you use scissors, needles, knives, or other tools.  Be careful not to burn yourself:  When you use an  iron.  When you cook. Contact a doctor if:  You have bruises and you do not know why. Get help right away if:  You are bleeding anywhere on your body.  You have blood in your spit, pee, or poop. This information is not intended to replace advice given to you by your health care provider. Make sure you discuss any questions you have with your health care provider. Document Released: 02/10/2011 Document Revised: 10/25/2015 Document Reviewed: 08/25/2014 Elsevier Interactive Patient Education  2017 [employer].

## 2016-04-08 NOTE — Discharge Summary (Addendum)
Physician Discharge Summary  Derek Shepherd [id number] DOB: 1956-01-25 DOA: 04/06/2016  PCP: Claretta Fraise, MD  Admit date: 04/06/2016 Discharge date: 04/08/2016  Recommendations for Outpatient Follow-up:  Hemoglobin is 9.5 and platelets are 55 today 04/08/2016. 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 male with a past medical history significant for ALL s/p BMT, DM, CHF EF 35%, chronic PE and emphysema on home O2, and recent anemia/thrombocytopenia who presents with fatigue. Over about the last 6-9 months, the patient has developed transfusion dependent anemia and thrombocytopenia (baseline platelets around 40K per Heme-Onc notes).  He had a BM biopsy in Nov that showed hypocellularity, no leukemia, and etiology remained unclear.  In the last month, he had a transfusion about 2 weeks ago at [facility] of 2 units (he describes this and his Heme-Onc notes do, but I don't see any transfusions in our system since Jan 04 2016), then again 1 week ago when he saw his Heme-Onc at [facility] of 1 additional unit. Each time he needs a transfusion, he gets weak, short of breath and tired with exertion and dizzy with standing.  When he got the two units, he felt considerably better the day aftewards until he gradually got weaker and more SOB, then he got transfused again 1 week ago, and felt only a little better, and within a few days was weak and tired again.  For the last 3-4 days, he  has been dizzy with standing, weak, tired with exertion and SOB.  Today he went to his PCP who referred him to the hospital for transfusion after a fingerstick Hgb came back 6.5 g/dL. He has had no epistaxis, gum bleeding, hematochezia, hemoptysis, hematemesis, bruising."  Hospital Course:   Symptomatic anemia / Anemia of chronic disease / Thrombocytopenia  - In the setting of myelosuppression from ALL  - S/P 2 U PRBC transfusion - Hemoglobin 9.5 this am - Platelets stable at 40 - Pt wants to go home today - Will follow up with heme on outpt basis   ALL s/p BMT with chronic GVHD - Continue sirolimus    Signed:  Leisa Lenz, MD  [facility] 04/08/2016, 9:32 AM  Pager #: [phone]  Time spent in minutes: less than 30 minutes   Discharge Exam: Vitals:   04/07/16 1300 04/08/16 0510  BP: 120/65 (!) 108/51  Pulse: 92 84  Resp: 16 16  Temp: 98.1 F (36.7 C) 98.2 F (36.8 C)   Vitals:   04/07/16 0942 04/07/16 1300 04/07/16 2219 04/08/16 0510  BP: 120/69 120/65  (!) 108/51  Pulse: 82 92  84  Resp:  16  16  Temp:  98.1 F (36.7 C)  98.2 F (36.8 C)  TempSrc:  Oral  Oral  SpO2: 98% 99% 98% 100%    General: Pt is alert, follows commands appropriately, not in acute distress Cardiovascular: Regular rate and rhythm, S1/S2 + Respiratory: Clear to auscultation bilaterally, no wheezing, no crackles, no rhonchi Abdominal: Soft, non tender, non distended, bowel  sounds +, no guarding Extremities: no edema, no cyanosis, pulses palpable bilaterally DP and PT Neuro: Grossly nonfocal  Discharge Instructions  Discharge Instructions    Call MD for:  persistant nausea and vomiting    Complete by:  As directed    Call MD for:  redness, tenderness, or signs of infection (pain, swelling, redness, odor or green/yellow discharge around incision site)    Complete by:  As directed    Call MD for:  severe uncontrolled pain    Complete by:  As directed    Diet - low sodium heart  healthy    Complete by:  As directed    Discharge instructions    Complete by:  As directed    Hemoglobin is 9.5 and platelets are 55 today 04/08/2016. Follow up with hematology   Increase activity slowly    Complete by:  As directed      Allergies as of 04/08/2016      Reactions   Nsaids Shortness Of Breath      Medication List    TAKE these medications   acidophilus Caps capsule Take 1 capsule by mouth daily.   albuterol (2.5 MG/3ML) 0.083% nebulizer solution Commonly known as:  PROVENTIL Take 3 mLs (2.5 mg total) by nebulization every 6 (six) hours as needed for wheezing or shortness of breath.   budesonide-formoterol 160-4.5 MCG/ACT inhaler Commonly known as:  SYMBICORT Inhale 2 puffs into the lungs 2 (two) times daily.   citalopram 40 MG tablet Commonly known as:  CELEXA Take 40 mg by mouth daily.   fluticasone 50 MCG/ACT nasal spray Commonly known as:  FLONASE Place 1 spray into both nostrils daily.   folic acid 1 MG tablet Commonly known as:  FOLVITE Take 1 tablet (1 mg total) by mouth daily.   HYDROmorphone 4 MG tablet Commonly known as:  DILAUDID Take 4 mg by mouth every 4 (four) hours as needed for moderate pain. Start taking on:  06/07/2016   lactulose 10 GM/15ML solution Commonly known as:  CHRONULAC Take 20 g by mouth 2 (two) times daily as needed for mild constipation.   levalbuterol 1.25 MG/0.5ML nebulizer solution Commonly known as:  XOPENEX Take 1.25 mg by nebulization every 6 (six) hours as needed for wheezing or shortness of breath.   metFORMIN 1000 MG tablet Commonly known as:  GLUCOPHAGE Take 1 tablet (1,000 mg total) by mouth 2 (two) times daily with a meal.   montelukast 10 MG tablet Commonly known as:  SINGULAIR Take 1 tablet (10 mg total) by mouth daily.   multivitamin with minerals Tabs tablet Take 1 tablet by mouth daily.   nefazodone 50 MG tablet Commonly known as:  SERZONE Take 1 tablet (50 mg total) by mouth at bedtime.    rOPINIRole 1 MG tablet Commonly known as:  REQUIP Take 1 mg by mouth at bedtime.   senna 8.6 MG tablet Commonly known as:  SENOKOT Take 2 tablets by mouth daily as needed for constipation.   Sirolimus 0.5 MG Tabs Take 1 tablet (0.5 mg total) by mouth daily.   SYSTANE 0.4-0.3 % Soln Generic drug:  Polyethyl Glycol-Propyl Glycol Place 1 drop into both eyes as needed (for dry eyes).   tiotropium 18 MCG inhalation capsule Commonly known as:  SPIRIVA Place 18 mcg into inhaler and inhale daily.   torsemide 100 MG tablet Commonly known as:  DEMADEX Take 1 tablet (100 mg total) by mouth daily. What changed:  how much to take   vitamin  B-12 1000 MCG tablet Commonly known as:  CYANOCOBALAMIN Take 1,000 mcg by mouth daily.      Follow-up Information    STACKS,WARREN, MD. Schedule an appointment as soon as possible for a visit in 1 week(s).   Specialty:  Family Medicine Contact information: [place] [address] [phone]            The results of significant diagnostics from this hospitalization (including imaging, microbiology, ancillary and laboratory) are listed below for reference.    Significant Diagnostic Studies: Dg Chest Port 1 View  Result Date: 04/06/2016 CLINICAL DATA:  Dyspnea on exertion.  Anemia.  History of CHF. EXAM: PORTABLE CHEST 1 VIEW COMPARISON:  Portable chest radiograph 03/04/2016, CT 02/25/2016 FINDINGS: Tip of the right chest port in the SVC. Unchanged heart size and mediastinal contours. No evidence of pulmonary edema, focal airspace disease, pleural fluid or pneumothorax. Multiple cerclage wires involving left ribs. IMPRESSION: No acute abnormality or change from prior exam. Electronically Signed   By: Jeb Levering M.D.   On: 04/06/2016 22:44    Microbiology: No results found for this or any previous visit (from the past 240 hour(s)).   Labs: Basic Metabolic Panel:  Recent Labs Lab 04/06/16 2022  NA 135  K 3.5  CL  92*  CO2 32  GLUCOSE 128*  BUN 19  CREATININE 1.15  CALCIUM 9.6   Liver Function Tests:  Recent Labs Lab 04/06/16 2022  AST 21  ALT 16*  ALKPHOS 56  BILITOT 0.4  PROT 6.0*  ALBUMIN 3.6   No results for input(s): LIPASE, AMYLASE in the last 168 hours. No results for input(s): AMMONIA in the last 168 hours. CBC:  Recent Labs Lab 04/06/16 2022 04/07/16 0812 04/08/16 0841  WBC 5.1 5.6 5.2  HGB 6.9* 8.9* 9.5*  HCT 22.6* 27.2* 29.0*  MCV 100.4* 94.4 94.8  PLT 64* 54* 55*   Cardiac Enzymes:  Recent Labs Lab 04/06/16 2022  TROPONINI <0.03   BNP: BNP (last 3 results)  Recent Labs  02/19/16 1154 03/04/16 1250 04/06/16 2022  BNP 25.6 49.0 44.4    ProBNP (last 3 results) No results for input(s): PROBNP in the last 8760 hours.  CBG:  Recent Labs Lab 04/07/16 0622 04/07/16 1132 04/07/16 1658 04/07/16 2110 04/08/16 0627  GLUCAP 115* 158* 115* 137* 99

## 2016-04-11 ENCOUNTER — Ambulatory Visit (INDEPENDENT_AMBULATORY_CARE_PROVIDER_SITE_OTHER): Payer: Medicare HMO | Admitting: Family Medicine

## 2016-04-11 ENCOUNTER — Ambulatory Visit: Payer: Medicare HMO | Admitting: Pharmacist

## 2016-04-11 ENCOUNTER — Ambulatory Visit (INDEPENDENT_AMBULATORY_CARE_PROVIDER_SITE_OTHER): Payer: Medicare HMO | Admitting: Pharmacist

## 2016-04-11 ENCOUNTER — Encounter: Payer: Self-pay | Admitting: Family Medicine

## 2016-04-11 VITALS — BP 87/56 | HR 92 | Temp 97.1°F | Ht 69.0 in | Wt 211.0 lb

## 2016-04-11 DIAGNOSIS — Z79899 Other long term (current) drug therapy: Secondary | ICD-10-CM

## 2016-04-11 DIAGNOSIS — D500 Iron deficiency anemia secondary to blood loss (chronic): Secondary | ICD-10-CM | POA: Diagnosis not present

## 2016-04-11 LAB — FINGERSTICK HEMOGLOBIN: HEMOGLOBIN: 9.4 g/dL — AB (ref 12.6–17.7)

## 2016-04-11 MED ORDER — NEFAZODONE HCL 50 MG PO TABS: 50.0000 mg | ORAL_TABLET | Freq: Every day | ORAL | 0 refills | Status: DC

## 2016-04-11 MED ORDER — FLUCONAZOLE 100 MG PO TABS: 100.0000 mg | ORAL_TABLET | Freq: Every day | ORAL | 0 refills | Status: DC

## 2016-04-11 MED ORDER — CITALOPRAM HYDROBROMIDE 40 MG PO TABS: 40.0000 mg | ORAL_TABLET | Freq: Every day | ORAL | 5 refills | Status: DC

## 2016-04-11 NOTE — Progress Notes (Signed)
Subjective:  Patient ID: Derek Shepherd, male    DOB: 1955/11/20  Age: 61 y.o. MRN: [id number]  CC: Sore Throat (pt here today c/o a sore spot in his throat when food touches it. He is seeing the oncologist at [facility] tomorrow for a bone marrow biopsy)   HPI HOLDAN STUCKE presents for recheck of hemoglobin. Had 2 units pRBC last week. Feeling somewhat better. However,  ST onset earlier today.    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   He has a past surgical history that includes Cholecystectomy; Lung surgery (Left); Exploratory laparotomy; Hernia repair; Colonoscopy w/ biopsies; and Bone marrow transplant (2011).   His family history includes Clotting disorder in his mother; Diabetes in his brother, father, maternal aunt, and sister; Heart attack in his father and mother; Prostate cancer in his brother.He reports that he quit smoking about 22 years ago. His smoking use included Cigarettes. He has never used smokeless tobacco. He reports that he drinks alcohol. He reports that he does not use drugs.    ROS Review of Systems  Constitutional: Negative for chills, diaphoresis and fever.  HENT: Positive for sore throat. Negative for rhinorrhea.   Respiratory: Positive for apnea. Negative for cough and shortness of breath.   Cardiovascular: Negative for chest pain.  Gastrointestinal: Negative for abdominal pain.  Musculoskeletal: Negative for arthralgias and myalgias.    Skin: Negative for rash.  Neurological: Negative for weakness and headaches.    Objective:  BP (!) 87/56   Pulse 92   Temp 97.1 F (36.2 C) (Oral)   Ht 5' 9"  (1.753 m)   Wt 211 lb (95.7 kg)   BMI 31.16 kg/m   BP Readings from Last 3 Encounters:  04/11/16 (!) 87/56  04/08/16 (!) 108/51  04/06/16 (!) 87/58    Wt Readings from Last 3 Encounters:  04/11/16 211 lb (95.7 kg)  04/06/16 211 lb (95.7 kg)  03/10/16 208 lb (94.3 kg)     Physical Exam  Constitutional: He appears well-developed and well-nourished.  HENT:  Head: Normocephalic and atraumatic.  Right Ear: Tympanic membrane and external ear normal. No decreased hearing is noted.  Left Ear: Tympanic membrane and external ear normal. No decreased hearing is noted.  Mouth/Throat: No oropharyngeal exudate or posterior oropharyngeal erythema.  Eyes: Pupils are equal, round, and reactive to light.  Neck: Normal range of motion. Neck supple.  Cardiovascular: Normal rate and regular rhythm.   No murmur heard. Pulmonary/Chest: Breath sounds normal. No respiratory distress.  Abdominal: Soft. Bowel sounds are normal. He exhibits no mass. There is no tenderness.  Vitals reviewed.   Dg Chest Port 1 View  Result Date: 04/06/2016 CLINICAL DATA:  Dyspnea on exertion.  Anemia.  History of CHF. EXAM: PORTABLE CHEST 1 VIEW COMPARISON:  Portable chest radiograph 03/04/2016, CT 02/25/2016 FINDINGS: Tip of the right chest port in the SVC. Unchanged heart size and mediastinal contours. No evidence of pulmonary edema, focal airspace disease, pleural fluid or pneumothorax. Multiple cerclage wires involving left ribs. IMPRESSION: No acute abnormality or change from prior exam. Electronically Signed   By: Jeb Levering M.D.   On:  04/06/2016 22:44    Assessment & Plan:   Dante was seen today for sore throat.  Diagnoses and all orders for this visit:  Blood loss anemia -     Fingerstick Hemoglobin  Other orders -     fluconazole  (DIFLUCAN) 100 MG tablet; Take 1 tablet (100 mg total) by mouth daily. -     nefazodone (SERZONE) 50 MG tablet; Take 1 tablet (50 mg total) by mouth at bedtime. -     citalopram (CELEXA) 40 MG tablet; Take 1 tablet (40 mg total) by mouth daily. Reduce to 1/2 daily while taking fluconazole (2 weeks)    I have changed [name]'s citalopram. I am also having him start on fluconazole. Additionally, I am having him maintain his vitamin B-12, metFORMIN, fluticasone, folic acid, montelukast, Sirolimus, budesonide-formoterol, lactulose, levalbuterol, multivitamin with minerals, rOPINIRole, tiotropium, Polyethyl Glycol-Propyl Glycol, acidophilus, senna, torsemide, HYDROmorphone, and nefazodone.  Allergies as of 04/11/2016      Reactions   Nsaids Shortness Of Breath      Medication List       Accurate as of 04/11/16 11:59 PM. Always use your most recent med list.          acidophilus Caps capsule Take 1 capsule by mouth daily.   budesonide-formoterol 160-4.5 MCG/ACT inhaler Commonly known as:  SYMBICORT Inhale 2 puffs into the lungs 2 (two) times daily.   citalopram 40 MG tablet Commonly known as:  CELEXA Take 1 tablet (40 mg total) by mouth daily. Reduce to 1/2 daily while taking fluconazole (2 weeks)   fluconazole 100 MG tablet Commonly known as:  DIFLUCAN Take 1 tablet (100 mg total) by mouth daily.   fluticasone 50 MCG/ACT nasal spray Commonly known as:  FLONASE Place 1 spray into both nostrils daily.   folic acid 1 MG tablet Commonly known as:  FOLVITE Take 1 tablet (1 mg total) by mouth daily.   HYDROmorphone 4 MG tablet Commonly known as:  DILAUDID Take 4 mg by mouth every 4 (four) hours as needed for moderate pain. Start taking on:  06/07/2016   lactulose 10 GM/15ML solution Commonly known as:  CHRONULAC Take 20 g by mouth 2 (two) times daily as needed for mild constipation.   levalbuterol 1.25 MG/0.5ML nebulizer solution Commonly known as:  XOPENEX Take 1.25 mg by  nebulization every 6 (six) hours as needed for wheezing or shortness of breath.   metFORMIN 1000 MG tablet Commonly known as:  GLUCOPHAGE Take 1 tablet (1,000 mg total) by mouth 2 (two) times daily with a meal.   montelukast 10 MG tablet Commonly known as:  SINGULAIR Take 1 tablet (10 mg total) by mouth daily.   multivitamin with minerals Tabs tablet Take 1 tablet by mouth daily.   nefazodone 50 MG tablet Commonly known as:  SERZONE Take 1 tablet (50 mg total) by mouth at bedtime.   rOPINIRole 1 MG tablet Commonly known as:  REQUIP Take 1 mg by mouth at bedtime.   senna 8.6 MG tablet Commonly known as:  SENOKOT Take 2 tablets by mouth daily as needed for constipation.   Sirolimus 0.5 MG Tabs Take 1 tablet (0.5 mg total) by mouth daily.   SYSTANE 0.4-0.3 % Soln Generic drug:  Polyethyl Glycol-Propyl Glycol Place 1 drop into both eyes as needed (for dry eyes).   tiotropium 18 MCG inhalation capsule Commonly known as:  SPIRIVA Place 18 mcg into inhaler and inhale daily.   torsemide 100 MG tablet Commonly known as:  DEMADEX  Take 1 tablet (100 mg total) by mouth daily.   vitamin B-12 1000 MCG tablet Commonly known as:  CYANOCOBALAMIN Take 1,000 mcg by mouth daily.        Follow-up: Return in about 2 weeks (around 04/25/2016).  Claretta Fraise, M.D.

## 2016-04-11 NOTE — Telephone Encounter (Signed)
error 

## 2016-04-11 NOTE — Progress Notes (Signed)
Patient ID: Derek Shepherd, male   DOB: 05-Nov-1955, 61 y.o.   MRN: [id number]   CC:  Medication Review  HPI:  Patient and wife wanted to meet to review medications.  [name] was recently in hospital and reports that he is having pain when swallowing.  Triaged to see PCP today.

## 2016-04-12 DIAGNOSIS — C9101 Acute lymphoblastic leukemia, in remission: Secondary | ICD-10-CM | POA: Diagnosis not present

## 2016-04-12 DIAGNOSIS — D649 Anemia, unspecified: Secondary | ICD-10-CM | POA: Diagnosis not present

## 2016-04-12 DIAGNOSIS — D696 Thrombocytopenia, unspecified: Secondary | ICD-10-CM | POA: Diagnosis not present

## 2016-04-12 DIAGNOSIS — C9100 Acute lymphoblastic leukemia not having achieved remission: Secondary | ICD-10-CM | POA: Diagnosis not present

## 2016-04-12 DIAGNOSIS — Z9484 Stem cells transplant status: Secondary | ICD-10-CM | POA: Diagnosis not present

## 2016-04-13 DIAGNOSIS — R69 Illness, unspecified: Secondary | ICD-10-CM | POA: Diagnosis not present

## 2016-04-18 DIAGNOSIS — R0902 Hypoxemia: Secondary | ICD-10-CM | POA: Diagnosis not present

## 2016-05-02 DIAGNOSIS — R69 Illness, unspecified: Secondary | ICD-10-CM | POA: Diagnosis not present

## 2016-05-03 DIAGNOSIS — E876 Hypokalemia: Secondary | ICD-10-CM | POA: Diagnosis not present

## 2016-05-03 DIAGNOSIS — D89811 Chronic graft-versus-host disease: Secondary | ICD-10-CM | POA: Diagnosis not present

## 2016-05-03 DIAGNOSIS — Z79899 Other long term (current) drug therapy: Secondary | ICD-10-CM | POA: Diagnosis not present

## 2016-05-03 DIAGNOSIS — C9101 Acute lymphoblastic leukemia, in remission: Secondary | ICD-10-CM | POA: Diagnosis not present

## 2016-05-03 DIAGNOSIS — J449 Chronic obstructive pulmonary disease, unspecified: Secondary | ICD-10-CM | POA: Diagnosis not present

## 2016-05-03 DIAGNOSIS — Z792 Long term (current) use of antibiotics: Secondary | ICD-10-CM | POA: Diagnosis not present

## 2016-05-03 DIAGNOSIS — D759 Disease of blood and blood-forming organs, unspecified: Secondary | ICD-10-CM | POA: Diagnosis not present

## 2016-05-03 DIAGNOSIS — H579 Unspecified disorder of eye and adnexa: Secondary | ICD-10-CM | POA: Diagnosis not present

## 2016-05-03 DIAGNOSIS — R69 Illness, unspecified: Secondary | ICD-10-CM | POA: Diagnosis not present

## 2016-05-03 DIAGNOSIS — Z9484 Stem cells transplant status: Secondary | ICD-10-CM | POA: Diagnosis not present

## 2016-05-03 DIAGNOSIS — G4700 Insomnia, unspecified: Secondary | ICD-10-CM | POA: Diagnosis not present

## 2016-05-04 DIAGNOSIS — R0902 Hypoxemia: Secondary | ICD-10-CM | POA: Diagnosis not present

## 2016-05-10 DIAGNOSIS — J449 Chronic obstructive pulmonary disease, unspecified: Secondary | ICD-10-CM | POA: Diagnosis not present

## 2016-05-10 DIAGNOSIS — R1319 Other dysphagia: Secondary | ICD-10-CM | POA: Diagnosis not present

## 2016-05-10 DIAGNOSIS — J441 Chronic obstructive pulmonary disease with (acute) exacerbation: Secondary | ICD-10-CM | POA: Diagnosis not present

## 2016-05-10 DIAGNOSIS — I5030 Unspecified diastolic (congestive) heart failure: Secondary | ICD-10-CM | POA: Diagnosis not present

## 2016-05-10 DIAGNOSIS — Z86711 Personal history of pulmonary embolism: Secondary | ICD-10-CM | POA: Diagnosis not present

## 2016-05-10 DIAGNOSIS — I110 Hypertensive heart disease with heart failure: Secondary | ICD-10-CM | POA: Diagnosis not present

## 2016-05-10 DIAGNOSIS — E119 Type 2 diabetes mellitus without complications: Secondary | ICD-10-CM | POA: Diagnosis not present

## 2016-05-10 DIAGNOSIS — R1310 Dysphagia, unspecified: Secondary | ICD-10-CM | POA: Diagnosis not present

## 2016-05-10 DIAGNOSIS — J984 Other disorders of lung: Secondary | ICD-10-CM | POA: Diagnosis not present

## 2016-05-10 DIAGNOSIS — J9611 Chronic respiratory failure with hypoxia: Secondary | ICD-10-CM | POA: Diagnosis not present

## 2016-05-10 DIAGNOSIS — D89813 Graft-versus-host disease, unspecified: Secondary | ICD-10-CM | POA: Diagnosis not present

## 2016-05-10 DIAGNOSIS — Z9981 Dependence on supplemental oxygen: Secondary | ICD-10-CM | POA: Diagnosis not present

## 2016-05-10 DIAGNOSIS — D801 Nonfamilial hypogammaglobulinemia: Secondary | ICD-10-CM | POA: Diagnosis not present

## 2016-05-16 DIAGNOSIS — R0902 Hypoxemia: Secondary | ICD-10-CM | POA: Diagnosis not present

## 2016-05-19 ENCOUNTER — Telehealth: Payer: Self-pay | Admitting: Family Medicine

## 2016-05-19 NOTE — Telephone Encounter (Signed)
Per [facility] ,,, Derek Shepherd will need futher appts. For dental work... Since he has health issues they need form [place] a letter saying will he need to be on medication before he comes in ,,, plz fax attn. Ava at [phone]

## 2016-05-19 NOTE — Telephone Encounter (Signed)
Spoke to Roderic Ovens, pt's wife and she states he has already had all of his dental work and doesn't need a note or any further dental work.

## 2016-05-23 ENCOUNTER — Ambulatory Visit (INDEPENDENT_AMBULATORY_CARE_PROVIDER_SITE_OTHER): Payer: Medicare HMO | Admitting: Family Medicine

## 2016-05-23 ENCOUNTER — Encounter: Payer: Self-pay | Admitting: Family Medicine

## 2016-05-23 VITALS — BP 115/69 | HR 80 | Temp 98.4°F | Ht 69.0 in | Wt 209.0 lb

## 2016-05-23 DIAGNOSIS — D649 Anemia, unspecified: Secondary | ICD-10-CM | POA: Diagnosis not present

## 2016-05-23 DIAGNOSIS — J40 Bronchitis, not specified as acute or chronic: Secondary | ICD-10-CM | POA: Diagnosis not present

## 2016-05-23 DIAGNOSIS — J329 Chronic sinusitis, unspecified: Secondary | ICD-10-CM

## 2016-05-23 MED ORDER — LEVOFLOXACIN 500 MG PO TABS
500.0000 mg | ORAL_TABLET | Freq: Every day | ORAL | 0 refills | Status: DC
Start: 2016-05-23 — End: 2016-07-19

## 2016-05-23 MED ORDER — DIPHENOXYLATE-ATROPINE 2.5-0.025 MG PO TABS: 2.0000 | ORAL_TABLET | Freq: Four times a day (QID) | ORAL | 0 refills | Status: DC | PRN

## 2016-05-23 NOTE — Progress Notes (Addendum)
Subjective:  Patient ID: Derek Shepherd, male    DOB: [birth year]  Age: 61 y.o. MRN: [id number]  CC: Sore Throat (pt here today c/o sore throat, stomach ache and cough.)   HPI Derek Shepherd presents for Patient presents with sore throat and upper respiratory congestion. Rhinorrhea that is frequently purulent. There is moderate sore throat and posterior drainage. Patient reports coughing frequently. No sputum noted. There is no fever, chills or sweats. The patient has baseline shortness of breath. Wearing his O2. Onset was 2 days ago. Gradually worsening.   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   He has a past surgical history that includes Cholecystectomy; Lung surgery (Left); Exploratory laparotomy; Hernia repair; Colonoscopy w/ biopsies; and Bone marrow transplant (2011).   His family history includes Clotting disorder in his mother; Diabetes in his brother, father, maternal aunt, and sister; Heart attack in his father and mother; Prostate cancer in his brother.He reports that he quit smoking about 22 years ago. His smoking use included Cigarettes. He has never used smokeless tobacco. He reports that he drinks alcohol. He reports that he does not use drugs.  Current Outpatient Prescriptions on File Prior to Visit  Medication Sig Dispense Refill  . acidophilus (RISAQUAD) CAPS capsule Take 1 capsule by mouth daily.    . budesonide-formoterol (SYMBICORT) 160-4.5 MCG/ACT  inhaler Inhale 2 puffs into the lungs 2 (two) times daily.     . citalopram (CELEXA) 40 MG tablet Take 1 tablet (40 mg total) by mouth daily. Reduce to 1/2 daily while taking fluconazole (2 weeks) 30 tablet 5  . fluconazole (DIFLUCAN) 100 MG tablet Take 1 tablet (100 mg total) by mouth daily. 15 tablet 0  . fluticasone (FLONASE) 50 MCG/ACT nasal spray Place 1 spray into both nostrils daily. 16 g 1  . folic acid (FOLVITE) 1 MG tablet Take 1 tablet (1 mg total) by mouth daily. 90 tablet 0  . [START ON 06/07/2016] HYDROmorphone (DILAUDID) 4 MG tablet Take 4 mg by mouth every 4 (four) hours as needed for moderate pain.     Marland Kitchen lactulose (CHRONULAC) 10 GM/15ML solution Take 20 g by mouth 2 (two) times daily as needed for mild constipation.    Marland Kitchen levalbuterol (XOPENEX) 1.25 MG/0.5ML nebulizer solution Take 1.25 mg by nebulization every 6 (six) hours as needed for wheezing or shortness of breath.    . metFORMIN (GLUCOPHAGE) 1000 MG tablet Take 1 tablet (1,000 mg total) by mouth 2 (two) times daily with a meal. 60 tablet 2  . montelukast (SINGULAIR) 10 MG tablet Take 1 tablet (10 mg total) by mouth daily. 90 tablet 0  . Multiple Vitamin (MULTIVITAMIN WITH MINERALS) TABS tablet Take 1 tablet by mouth daily.    . nefazodone (SERZONE) 50 MG tablet Take 1 tablet (50 mg total) by mouth at bedtime. 90 tablet 0  . Polyethyl Glycol-Propyl Glycol (SYSTANE) 0.4-0.3 % SOLN Place 1 drop into both eyes as needed (for dry eyes).    Marland Kitchen rOPINIRole (REQUIP) 1 MG tablet Take 1 mg by mouth at bedtime.    . senna (SENOKOT) 8.6 MG tablet Take 2 tablets by mouth daily  as needed for constipation.    . Sirolimus 0.5 MG TABS Take 1 tablet (0.5 mg total) by mouth daily. 90 tablet 0  . tiotropium (SPIRIVA) 18 MCG inhalation capsule Place 18 mcg into inhaler and inhale daily.    Marland Kitchen torsemide (DEMADEX) 100 MG tablet Take 1 tablet (100 mg total) by mouth daily. (Patient taking differently: Take 50 mg by mouth daily. ) 30 tablet 11  . vitamin  B-12 (CYANOCOBALAMIN) 1000 MCG tablet Take 1,000 mcg by mouth daily.      No current facility-administered medications on file prior to visit.     ROS Review of Systems  Constitutional: Positive for fatigue. Negative for chills.  HENT: Positive for congestion, postnasal drip, rhinorrhea, sinus pressure and sore throat. Negative for ear discharge, ear pain, hearing loss, nosebleeds, sneezing and trouble swallowing.   Respiratory: Positive for cough and shortness of breath. Negative for chest tightness.   Cardiovascular: Negative for chest pain and palpitations.  Skin: Negative for rash.    Objective:  BP 115/69   Pulse 80   Temp 98.4 F (36.9 C) (Oral)   Ht 5\' 9"  (1.753 m)   Wt 209 lb (94.8 kg)   BMI 30.86 kg/m   Physical Exam  Constitutional: He appears well-developed and well-nourished.  HENT:  Head: Normocephalic and atraumatic.  Right Ear: Tympanic membrane and external ear normal. No decreased hearing is noted.  Left Ear: Tympanic membrane and external ear normal. No decreased hearing is noted.  Nose: Mucosal edema present. Right sinus exhibits no frontal sinus tenderness. Left sinus exhibits no frontal sinus tenderness.  Mouth/Throat: No oropharyngeal exudate or posterior oropharyngeal erythema.  Neck: No Brudzinski's sign noted.  Pulmonary/Chest: No respiratory distress.  Breath sounds distant. No rales or rhonchi. Wearing port O2, nasal cannula  Lymphadenopathy:       Head (right side): No preauricular adenopathy present.       Head (left side): No preauricular adenopathy present.       Right cervical: No superficial cervical adenopathy present.      Left cervical: No superficial cervical adenopathy present.    Assessment & Plan:   Keaston was seen today for sore throat.  Diagnoses and all orders for this visit:  Sinobronchitis  Symptomatic anemia -     CBC with Differential/Platelet  Other orders -     levofloxacin (LEVAQUIN) 500 MG tablet; Take 1 tablet  (500 mg total) by mouth daily. -     diphenoxylate-atropine (LOMOTIL) 2.5-0.025 MG tablet; Take 2 tablets by mouth 4 (four) times daily as needed for diarrhea or loose stools.   I am having Mr. Pavlicek start on levofloxacin and diphenoxylate-atropine. I am also having him maintain his vitamin B-12, metFORMIN, fluticasone, folic acid, montelukast, Sirolimus, budesonide-formoterol, lactulose, levalbuterol, multivitamin with minerals, rOPINIRole, tiotropium, Polyethyl Glycol-Propyl Glycol, acidophilus, senna, torsemide, HYDROmorphone, fluconazole, nefazodone, citalopram, mycophenolate, and potassium chloride.  Meds ordered this encounter  Medications  . mycophenolate (CELLCEPT) 500 MG tablet    Sig: Take 500 mg by mouth.  . potassium chloride (MICRO-K) 10 MEQ CR capsule    Sig: Take 2 tablets twice daily  . levofloxacin (LEVAQUIN) 500 MG tablet    Sig: Take 1 tablet (500 mg total) by mouth daily.    Dispense:  7 tablet    Refill:  0  . diphenoxylate-atropine (LOMOTIL) 2.5-0.025 MG tablet    Sig: Take 2 tablets by mouth 4 (four) times daily as needed for diarrhea or loose stools.    Dispense:  30  tablet    Refill:  0     Follow-up: Return if symptoms worsen or fail to improve.  Claretta Fraise, M.D.

## 2016-05-23 NOTE — Addendum Note (Signed)
Addended by: Claretta Fraise on: 05/23/2016 02:18 PM   Modules accepted: Orders

## 2016-05-24 ENCOUNTER — Telehealth: Payer: Self-pay | Admitting: *Deleted

## 2016-05-24 LAB — CBC WITH DIFFERENTIAL/PLATELET
Basophils Absolute: 0 10*3/uL (ref 0.0–0.2)
Basos: 0 %
EOS (ABSOLUTE): 0.2 10*3/uL (ref 0.0–0.4)
EOS: 2 %
HEMATOCRIT: 27.2 % — AB (ref 37.5–51.0)
Hemoglobin: 8.7 g/dL — CL (ref 13.0–17.7)
Immature Grans (Abs): 0 10*3/uL (ref 0.0–0.1)
Immature Granulocytes: 0 %
LYMPHS ABS: 1.5 10*3/uL (ref 0.7–3.1)
Lymphs: 16 %
MCH: 32 pg (ref 26.6–33.0)
MCHC: 32 g/dL (ref 31.5–35.7)
MCV: 100 fL — ABNORMAL HIGH (ref 79–97)
MONOS ABS: 0.8 10*3/uL (ref 0.1–0.9)
Monocytes: 9 %
Neutrophils Absolute: 6.7 10*3/uL (ref 1.4–7.0)
Neutrophils: 73 %
Platelets: 113 10*3/uL — ABNORMAL LOW (ref 150–379)
RBC: 2.72 x10E6/uL — AB (ref 4.14–5.80)
RDW: 21 % — AB (ref 12.3–15.4)
WBC: 9.2 10*3/uL (ref 3.4–10.8)

## 2016-05-24 NOTE — Telephone Encounter (Signed)
Please contact the patient they know his hemoglobin is falling again. At this time is just recheck blood count in one week.

## 2016-05-25 ENCOUNTER — Telehealth: Payer: Self-pay | Admitting: Family Medicine

## 2016-05-25 ENCOUNTER — Other Ambulatory Visit: Payer: Self-pay | Admitting: *Deleted

## 2016-05-25 DIAGNOSIS — D649 Anemia, unspecified: Secondary | ICD-10-CM

## 2016-05-25 MED ORDER — ROPINIROLE HCL 3 MG PO TABS: 3.0000 mg | ORAL_TABLET | Freq: Every day | ORAL | 2 refills | Status: DC

## 2016-05-25 NOTE — Telephone Encounter (Signed)
Spoke to pt's wife and advised of MD feedback and she voiced understanding and will come back for repeat CBC.

## 2016-05-25 NOTE — Telephone Encounter (Signed)
I sent in the requested prescription 

## 2016-05-25 NOTE — Telephone Encounter (Signed)
Spoke with patient, he states he has been having trouble sleeping.  Feels like he is restless all night.  Taking requip, but feels this is not helping with restless legs.  Advised will send note to Dr. Livia Snellen to advise, but in the meantime he can try melatonin over the counter to see if this helps.

## 2016-05-26 NOTE — Telephone Encounter (Signed)
LMOVM that requested Rx has been sent to pharmacy

## 2016-05-31 ENCOUNTER — Encounter: Payer: Self-pay | Admitting: Family Medicine

## 2016-05-31 ENCOUNTER — Ambulatory Visit (INDEPENDENT_AMBULATORY_CARE_PROVIDER_SITE_OTHER): Payer: Medicare HMO

## 2016-05-31 ENCOUNTER — Ambulatory Visit (INDEPENDENT_AMBULATORY_CARE_PROVIDER_SITE_OTHER): Payer: Medicare HMO | Admitting: Family Medicine

## 2016-05-31 VITALS — BP 125/79 | HR 81 | Temp 98.4°F | Ht 69.0 in | Wt 206.0 lb

## 2016-05-31 DIAGNOSIS — R1012 Left upper quadrant pain: Secondary | ICD-10-CM

## 2016-05-31 DIAGNOSIS — J431 Panlobular emphysema: Secondary | ICD-10-CM

## 2016-05-31 DIAGNOSIS — R0602 Shortness of breath: Secondary | ICD-10-CM

## 2016-05-31 DIAGNOSIS — D500 Iron deficiency anemia secondary to blood loss (chronic): Secondary | ICD-10-CM | POA: Diagnosis not present

## 2016-05-31 LAB — MICROSCOPIC EXAMINATION
Epithelial Cells (non renal): NONE SEEN /hpf (ref 0–10)
RENAL EPITHEL UA: NONE SEEN /HPF
WBC, UA: NONE SEEN /hpf (ref 0–?)

## 2016-05-31 LAB — URINALYSIS, COMPLETE
Bilirubin, UA: NEGATIVE
Glucose, UA: NEGATIVE
Ketones, UA: NEGATIVE
LEUKOCYTES UA: NEGATIVE
NITRITE UA: NEGATIVE
PH UA: 6 (ref 5.0–7.5)
Protein, UA: NEGATIVE
Urobilinogen, Ur: 0.2 mg/dL (ref 0.2–1.0)

## 2016-05-31 LAB — FINGERSTICK HEMOGLOBIN: Hemoglobin: 9.7 g/dL — ABNORMAL LOW (ref 12.6–17.7)

## 2016-05-31 MED ORDER — FLUCONAZOLE 100 MG PO TABS: 100.0000 mg | ORAL_TABLET | Freq: Every day | ORAL | 0 refills | Status: DC

## 2016-05-31 NOTE — Progress Notes (Signed)
Your chest x-ray looked normal. Thanks, WS.

## 2016-05-31 NOTE — Progress Notes (Addendum)
Subjective:  Patient ID: Derek Shepherd, male    DOB: Sep 17, 1955  Age: 61 y.o. MRN: [id number]  CC: Fatigue (pt here today following up for blood loss anemia and he has been feeling much weaker recently and not able to sleep at night.)   HPI Derek Shepherd presents for recheck of anemia. He continues to feel weak, tired and fatiqued. Sx are severe. Constant and slowly worsening. Has soreness most of the time in the periumbilical region. There is rawness radiating from the throat down to the stomach into the same periumbilical area. Appetite is decreased in general.  Pt. Doesn't think he took the diflucan prescribed 6 weeks ago. Not sleeping well. Awakens during the night.   Depression screen PHQ 2/9 05/31/2016  Decreased Interest 2  Down, Depressed, Hopeless 1  PHQ - 2 Score 3  Altered sleeping 3  Tired, decreased energy 3  Change in appetite 3  Feeling bad or failure about yourself  1  Trouble concentrating 1  Moving slowly or fidgety/restless 0  Suicidal thoughts 0  PHQ-9 Score 14  Difficult doing work/chores -  Some recent data might be hidden    History Derek Shepherd has a past medical history of Adenomatous colon polyp; Anemia; Anxiety; Arthritis; Asthma; Bowel obstruction; CHF (congestive heart failure) (Derek Shepherd); COPD (chronic obstructive pulmonary disease) (Derek Shepherd); Depression; Diverticulosis; DVT (deep venous thrombosis) (Derek Shepherd); Gallstones; GERD (gastroesophageal reflux disease); 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   He has a past surgical history that includes Cholecystectomy; Lung surgery (Left); Exploratory laparotomy; Hernia repair; Colonoscopy w/ biopsies; and Bone marrow transplant (2011).   His family history includes Clotting disorder in his mother; Diabetes in his brother, father,  maternal aunt, and sister; Heart attack in his father and mother; Prostate cancer in his brother.He reports that he quit smoking about 22 years ago. His smoking use included Cigarettes. He has never used smokeless tobacco. He reports that he drinks alcohol. He reports that he does not use drugs.    ROS Review of Systems  Constitutional: Positive for appetite change (decreased). Negative for chills, diaphoresis, fever and unexpected weight change.  HENT: Positive for trouble swallowing. Negative for congestion, hearing loss, rhinorrhea and sore throat.   Eyes: Negative for visual disturbance.  Respiratory: Positive for cough and shortness of breath.   Cardiovascular: Negative for chest pain.  Gastrointestinal: Positive for abdominal pain. Negative for constipation and diarrhea.  Genitourinary: Negative for dysuria and flank pain.  Musculoskeletal: Negative for arthralgias and joint swelling.  Skin: Negative for rash.  Neurological: Negative for dizziness and headaches.  Psychiatric/Behavioral: Negative for dysphoric mood and sleep disturbance.    Objective:  BP 125/79   Pulse 81   Temp 98.4 F (36.9 C) (Oral)   Ht _0  (1.753 m)   Wt 206 lb (93.4 kg)   BMI 30.42 kg/m   BP Readings from Last 3 Encounters:  05/31/16 125/79  05/23/16 115/69  04/11/16 (!) 87/56    Wt Readings from Last 3 Encounters:  05/31/16 206 lb (93.4 kg)  05/23/16 209 lb (94.8 kg)  04/11/16 211 lb (95.7 kg)     Physical Exam  Constitutional: He is oriented to person, place, and time. He appears well-developed and well-nourished. No distress.  HENT:  Head: Normocephalic and atraumatic.  Right Ear: External ear normal.  Left Ear: External ear normal.  Nose: Nose normal.  Mouth/Throat: Oropharynx is clear and moist.  Eyes: Conjunctivae and EOM are normal. Pupils are equal, round, and reactive to light.  Neck: Normal range of motion. Neck supple. No thyromegaly present.  Cardiovascular: Normal rate,  regular rhythm and normal heart sounds.   No murmur heard. Pulmonary/Chest: Effort normal. No respiratory distress. He has no wheezes. He has rales (faint, at RLL).  Abdominal: Soft. Bowel sounds are normal. He exhibits no distension. There is no tenderness.  Lymphadenopathy:    He has no cervical adenopathy.  Neurological: He is alert and oriented to person, place, and time. He has normal reflexes.  Skin: Skin is warm and dry.  Psychiatric: He has a normal mood and affect. His behavior is normal. Judgment and thought content normal.      Assessment & Plan:   Derek Shepherd was seen today for fatigue.  Diagnoses and all orders for this visit:  Blood loss anemia -     Fingerstick Hemoglobin -     CBC with Differential/Platelet -     CMP14+EGFR  Panlobular emphysema (Derek Shepherd) -     CBC with Differential/Platelet -     CMP14+EGFR  LUQ abdominal pain -     CBC with Differential/Platelet -     CMP14+EGFR -     Amylase -     Lipase -     Urinalysis, Complete  SOB (shortness of breath) -     DG Chest 2 View; Future -     CBC with Differential/Platelet -     CMP14+EGFR  Other orders -     fluconazole (DIFLUCAN) 100 MG tablet; Take 1 tablet (100 mg total) by mouth daily. -     Microscopic Examination    CXR - no infiltrate noted   I am having Derek Shepherd maintain his vitamin B-12, metFORMIN, fluticasone, folic acid, montelukast, Sirolimus, budesonide-formoterol, lactulose, levalbuterol, multivitamin with minerals, tiotropium, Polyethyl Glycol-Propyl Glycol, acidophilus, senna, torsemide, HYDROmorphone, nefazodone, citalopram, mycophenolate, potassium chloride, levofloxacin, diphenoxylate-atropine, rOPINIRole, and fluconazole.  Allergies as of 05/31/2016      Reactions   Nsaids Shortness Of Breath      Medication List       Accurate as of 05/31/16  9:46 PM. Always use your most recent med list.          acidophilus Caps capsule Take 1 capsule by mouth daily.     budesonide-formoterol 160-4.5 MCG/ACT inhaler Commonly known as:  SYMBICORT Inhale 2 puffs into the lungs 2 (two) times daily.   citalopram 40 MG tablet Commonly known as:  CELEXA Take 1 tablet (40 mg total) by mouth daily. Reduce to 1/2 daily while taking fluconazole (2 weeks)   diphenoxylate-atropine 2.5-0.025 MG tablet Commonly known as:  LOMOTIL Take 2 tablets by mouth 4 (four) times daily as needed for diarrhea or loose stools.   fluconazole 100 MG tablet Commonly known as:  DIFLUCAN Take 1 tablet (100 mg total) by mouth daily.   fluticasone 50 MCG/ACT nasal spray Commonly known as:  FLONASE Place 1 spray into both nostrils daily.   folic acid 1 MG tablet Commonly known as:  FOLVITE Take 1 tablet (1 mg total) by mouth daily.   HYDROmorphone 4 MG tablet Commonly known as:  DILAUDID Take 4 mg by mouth every 4 (four) hours as needed for moderate pain. Start taking on:  06/07/2016   lactulose 10 GM/15ML solution Commonly known as:  CHRONULAC Take 20 g by mouth 2 (two) times daily as needed for mild constipation.   levalbuterol  1.25 MG/0.5ML nebulizer solution Commonly known as:  XOPENEX Take 1.25 mg by nebulization every 6 (six) hours as needed for wheezing or shortness of breath.   levofloxacin 500 MG tablet Commonly known as:  LEVAQUIN Take 1 tablet (500 mg total) by mouth daily.   metFORMIN 1000 MG tablet Commonly known as:  GLUCOPHAGE Take 1 tablet (1,000 mg total) by mouth 2 (two) times daily with a meal.   montelukast 10 MG tablet Commonly known as:  SINGULAIR Take 1 tablet (10 mg total) by mouth daily.   multivitamin with minerals Tabs tablet Take 1 tablet by mouth daily.   mycophenolate 500 MG tablet Commonly known as:  CELLCEPT Take 500 mg by mouth.   nefazodone 50 MG tablet Commonly known as:  SERZONE Take 1 tablet (50 mg total) by mouth at bedtime.   potassium chloride 10 MEQ CR capsule Commonly known as:  MICRO-K Take 2 tablets twice daily    rOPINIRole 3 MG tablet Commonly known as:  REQUIP Take 1 tablet (3 mg total) by mouth at bedtime.   senna 8.6 MG tablet Commonly known as:  SENOKOT Take 2 tablets by mouth daily as needed for constipation.   Sirolimus 0.5 MG Tabs Take 1 tablet (0.5 mg total) by mouth daily.   SYSTANE 0.4-0.3 % Soln Generic drug:  Polyethyl Glycol-Propyl Glycol Place 1 drop into both eyes as needed (for dry eyes).   tiotropium 18 MCG inhalation capsule Commonly known as:  SPIRIVA Place 18 mcg into inhaler and inhale daily.   torsemide 100 MG tablet Commonly known as:  DEMADEX Take 1 tablet (100 mg total) by mouth daily.   vitamin B-12 1000 MCG tablet Commonly known as:  CYANOCOBALAMIN Take 1,000 mcg by mouth daily.        Follow-up: Return in about 1 week (around 06/07/2016).  Claretta Fraise, M.D.

## 2016-06-01 LAB — CMP14+EGFR
ALT: 17 [IU]/L (ref 0–44)
AST: 19 [IU]/L (ref 0–40)
Albumin/Globulin Ratio: 2.3 — ABNORMAL HIGH (ref 1.2–2.2)
Albumin: 4.4 g/dL (ref 3.5–5.5)
Alkaline Phosphatase: 78 [IU]/L (ref 39–117)
BUN/Creatinine Ratio: 17 (ref 9–20)
BUN: 15 mg/dL (ref 6–24)
Bilirubin Total: <0.2 mg/dL (ref 0.0–1.2)
CO2: 37 mmol/L — ABNORMAL HIGH (ref 18–29)
Calcium: 10 mg/dL (ref 8.7–10.2)
Chloride: 90 mmol/L — ABNORMAL LOW (ref 96–106)
Creatinine, Ser: 0.86 mg/dL (ref 0.76–1.27)
GFR calc Af Amer: 110 mL/min/{1.73_m2}
GFR calc non Af Amer: 96 mL/min/{1.73_m2}
Globulin, Total: 1.9 g/dL (ref 1.5–4.5)
Glucose: 110 mg/dL — ABNORMAL HIGH (ref 65–99)
Potassium: 4.1 mmol/L (ref 3.5–5.2)
Sodium: 143 mmol/L (ref 134–144)
Total Protein: 6.3 g/dL (ref 6.0–8.5)

## 2016-06-01 LAB — CBC WITH DIFFERENTIAL/PLATELET
Basophils Absolute: 0.1 10*3/uL (ref 0.0–0.2)
Basos: 1 %
EOS (ABSOLUTE): 0.2 10*3/uL (ref 0.0–0.4)
Eos: 2 %
Hematocrit: 28.3 % — ABNORMAL LOW (ref 37.5–51.0)
Hemoglobin: 9.3 g/dL — ABNORMAL LOW (ref 13.0–17.7)
Immature Grans (Abs): 0 10*3/uL (ref 0.0–0.1)
Immature Granulocytes: 1 %
Lymphocytes Absolute: 1.6 10*3/uL (ref 0.7–3.1)
Lymphs: 18 %
MCH: 33 pg (ref 26.6–33.0)
MCHC: 32.9 g/dL (ref 31.5–35.7)
MCV: 100 fL — ABNORMAL HIGH (ref 79–97)
Monocytes Absolute: 0.9 10*3/uL (ref 0.1–0.9)
Monocytes: 11 %
Neutrophils Absolute: 6 10*3/uL (ref 1.4–7.0)
Neutrophils: 67 %
Platelets: 103 10*3/uL — ABNORMAL LOW (ref 150–379)
RBC: 2.82 x10E6/uL — ABNORMAL LOW (ref 4.14–5.80)
RDW: 19.7 % — ABNORMAL HIGH (ref 12.3–15.4)
WBC: 8.8 10*3/uL (ref 3.4–10.8)

## 2016-06-01 LAB — LIPASE: Lipase: 18 U/L (ref 13–78)

## 2016-06-01 LAB — AMYLASE: Amylase: 47 U/L (ref 31–124)

## 2016-06-03 ENCOUNTER — Other Ambulatory Visit: Payer: Self-pay | Admitting: Family Medicine

## 2016-06-03 DIAGNOSIS — R0902 Hypoxemia: Secondary | ICD-10-CM | POA: Diagnosis not present

## 2016-06-06 ENCOUNTER — Other Ambulatory Visit: Payer: Self-pay | Admitting: Family Medicine

## 2016-06-06 DIAGNOSIS — R69 Illness, unspecified: Secondary | ICD-10-CM | POA: Diagnosis not present

## 2016-06-07 ENCOUNTER — Telehealth: Payer: Self-pay

## 2016-06-07 ENCOUNTER — Other Ambulatory Visit: Payer: Self-pay | Admitting: Family Medicine

## 2016-06-07 DIAGNOSIS — K5903 Drug induced constipation: Secondary | ICD-10-CM | POA: Diagnosis not present

## 2016-06-07 DIAGNOSIS — R000 Tachycardia, unspecified: Secondary | ICD-10-CM | POA: Diagnosis not present

## 2016-06-07 DIAGNOSIS — M79605 Pain in left leg: Secondary | ICD-10-CM | POA: Diagnosis not present

## 2016-06-07 DIAGNOSIS — G894 Chronic pain syndrome: Secondary | ICD-10-CM | POA: Diagnosis not present

## 2016-06-07 MED ORDER — UMECLIDINIUM BROMIDE 62.5 MCG/INH IN AEPB: 1.0000 | INHALATION_SPRAY | Freq: Every day | RESPIRATORY_TRACT | 11 refills | Status: DC

## 2016-06-07 NOTE — Telephone Encounter (Signed)
Incruse ordered, notify pt.

## 2016-06-09 DIAGNOSIS — K219 Gastro-esophageal reflux disease without esophagitis: Secondary | ICD-10-CM | POA: Diagnosis not present

## 2016-06-09 DIAGNOSIS — R1310 Dysphagia, unspecified: Secondary | ICD-10-CM | POA: Diagnosis not present

## 2016-06-09 DIAGNOSIS — K449 Diaphragmatic hernia without obstruction or gangrene: Secondary | ICD-10-CM | POA: Diagnosis not present

## 2016-06-09 DIAGNOSIS — M2578 Osteophyte, vertebrae: Secondary | ICD-10-CM | POA: Diagnosis not present

## 2016-06-11 ENCOUNTER — Other Ambulatory Visit: Payer: Self-pay | Admitting: Family Medicine

## 2016-06-11 ENCOUNTER — Other Ambulatory Visit: Payer: Self-pay | Admitting: Family

## 2016-06-11 DIAGNOSIS — E1165 Type 2 diabetes mellitus with hyperglycemia: Secondary | ICD-10-CM

## 2016-06-14 DIAGNOSIS — D696 Thrombocytopenia, unspecified: Secondary | ICD-10-CM | POA: Diagnosis not present

## 2016-06-14 DIAGNOSIS — G4700 Insomnia, unspecified: Secondary | ICD-10-CM | POA: Diagnosis not present

## 2016-06-14 DIAGNOSIS — J449 Chronic obstructive pulmonary disease, unspecified: Secondary | ICD-10-CM | POA: Diagnosis not present

## 2016-06-14 DIAGNOSIS — D89811 Chronic graft-versus-host disease: Secondary | ICD-10-CM | POA: Diagnosis not present

## 2016-06-14 DIAGNOSIS — D89813 Graft-versus-host disease, unspecified: Secondary | ICD-10-CM | POA: Diagnosis not present

## 2016-06-14 DIAGNOSIS — K5900 Constipation, unspecified: Secondary | ICD-10-CM | POA: Diagnosis not present

## 2016-06-14 DIAGNOSIS — E876 Hypokalemia: Secondary | ICD-10-CM | POA: Diagnosis not present

## 2016-06-14 DIAGNOSIS — G8929 Other chronic pain: Secondary | ICD-10-CM | POA: Diagnosis not present

## 2016-06-14 DIAGNOSIS — Z9484 Stem cells transplant status: Secondary | ICD-10-CM | POA: Diagnosis not present

## 2016-06-14 DIAGNOSIS — J9611 Chronic respiratory failure with hypoxia: Secondary | ICD-10-CM | POA: Diagnosis not present

## 2016-06-14 DIAGNOSIS — J42 Unspecified chronic bronchitis: Secondary | ICD-10-CM | POA: Diagnosis not present

## 2016-06-14 DIAGNOSIS — R69 Illness, unspecified: Secondary | ICD-10-CM | POA: Diagnosis not present

## 2016-06-14 DIAGNOSIS — C9101 Acute lymphoblastic leukemia, in remission: Secondary | ICD-10-CM | POA: Diagnosis not present

## 2016-06-16 DIAGNOSIS — R0902 Hypoxemia: Secondary | ICD-10-CM | POA: Diagnosis not present

## 2016-06-20 NOTE — Telephone Encounter (Signed)
Patient aware.

## 2016-06-27 DIAGNOSIS — J9611 Chronic respiratory failure with hypoxia: Secondary | ICD-10-CM | POA: Diagnosis not present

## 2016-06-27 DIAGNOSIS — E119 Type 2 diabetes mellitus without complications: Secondary | ICD-10-CM | POA: Diagnosis not present

## 2016-06-27 DIAGNOSIS — Z79899 Other long term (current) drug therapy: Secondary | ICD-10-CM | POA: Diagnosis not present

## 2016-06-27 DIAGNOSIS — Z86711 Personal history of pulmonary embolism: Secondary | ICD-10-CM | POA: Diagnosis not present

## 2016-06-27 DIAGNOSIS — J439 Emphysema, unspecified: Secondary | ICD-10-CM | POA: Diagnosis not present

## 2016-06-27 DIAGNOSIS — D801 Nonfamilial hypogammaglobulinemia: Secondary | ICD-10-CM | POA: Diagnosis not present

## 2016-06-27 DIAGNOSIS — J159 Unspecified bacterial pneumonia: Secondary | ICD-10-CM | POA: Diagnosis not present

## 2016-06-27 DIAGNOSIS — D89813 Graft-versus-host disease, unspecified: Secondary | ICD-10-CM | POA: Diagnosis not present

## 2016-06-27 DIAGNOSIS — I110 Hypertensive heart disease with heart failure: Secondary | ICD-10-CM | POA: Diagnosis not present

## 2016-06-27 DIAGNOSIS — J42 Unspecified chronic bronchitis: Secondary | ICD-10-CM | POA: Diagnosis not present

## 2016-06-27 DIAGNOSIS — I5030 Unspecified diastolic (congestive) heart failure: Secondary | ICD-10-CM | POA: Diagnosis not present

## 2016-07-04 DIAGNOSIS — R0902 Hypoxemia: Secondary | ICD-10-CM | POA: Diagnosis not present

## 2016-07-07 ENCOUNTER — Encounter: Payer: Self-pay | Admitting: Pediatrics

## 2016-07-07 ENCOUNTER — Other Ambulatory Visit: Payer: Self-pay | Admitting: Family Medicine

## 2016-07-07 ENCOUNTER — Ambulatory Visit (INDEPENDENT_AMBULATORY_CARE_PROVIDER_SITE_OTHER): Payer: Medicare HMO | Admitting: Pediatrics

## 2016-07-07 VITALS — BP 121/70 | HR 80 | Temp 98.7°F | Resp 20 | Ht 69.0 in | Wt 204.2 lb

## 2016-07-07 DIAGNOSIS — S80921A Unspecified superficial injury of right lower leg, initial encounter: Secondary | ICD-10-CM | POA: Diagnosis not present

## 2016-07-07 DIAGNOSIS — W57XXXA Bitten or stung by nonvenomous insect and other nonvenomous arthropods, initial encounter: Secondary | ICD-10-CM | POA: Diagnosis not present

## 2016-07-07 MED ORDER — GLUCOSE BLOOD VI STRP: ORAL_STRIP | 12 refills | Status: DC

## 2016-07-07 NOTE — Telephone Encounter (Signed)
Patient has tick bite. Appointment made for today with Dr. Evette Doffing.

## 2016-07-07 NOTE — Progress Notes (Signed)
  Subjective:   Patient ID: Derek Shepherd, male    DOB: [birth year], 61 y.o.   MRN: [id number] CC: Tick Removal (Right leg, yester day)  HPI: Derek Shepherd is a 61 y.o. male presenting for Tick Removal (Right leg, yester day)  Had a tick attached in RLE for maybe 24h, took off 2 days ago Feeling fine Site is slightly red around it, not itching Has been on doxycycline starting 4/23 for 10 days for pneumonia No fevers No itching  Has not noticed any rash other than slight redness around area of bite No joint pain  Relevant past medical, surgical, family and social history reviewed. Allergies and medications reviewed and updated. History  Smoking Status  . Former Smoker  . Types: Cigarettes  . Quit date: 07/13/1993  Smokeless Tobacco  . Never Used   ROS: Per HPI   Objective:    BP 121/70   Pulse 80   Temp 98.7 F (37.1 C) (Oral)   Resp 20   Ht 5\' 9"  (1.753 m)   Wt 204 lb 3.2 oz (92.6 kg)   SpO2 98% Comment: 5L O2  BMI 30.16 kg/m   Wt Readings from Last 3 Encounters:  07/07/16 204 lb 3.2 oz (92.6 kg)  05/31/16 206 lb (93.4 kg)  05/23/16 209 lb (94.8 kg)    Gen: NAD, alert, cooperative with exam, NCAT EYES: EOMI, no conjunctival injection, or no icterus CV: NRRR, normal S1/S2, no murmur, distal pulses 2+ b/l Resp: decreased breath sounds throughout, no wheezes or crackles, normal WOB Abd: +BS, soft, NTND.  Ext: No edema, warm Neuro: Alert and oriented Skin: R ant shin with 1cm slighlty pink induration, no tenderness, central scabbed over 33mm minimal erosion  Assessment & Plan:  Derek Shepherd was seen today for tick removal.  Diagnoses and all orders for this visit:  Tick bite, initial encounter Does not appear infected Feeling well now Has been on doxycycline the first 24h after tick removed and up until that point Discussed return precautions  Follow up plan: Return if symptoms worsen or fail to improve. Assunta Found, MD [place]

## 2016-07-07 NOTE — Patient Instructions (Addendum)
Tick Bite Information Introduction Ticks are insects that attach themselves to the skin. There are many types of ticks. Common types include wood ticks and deer ticks. Sometimes, ticks carry diseases that can make a person very ill. The most common places for ticks to attach themselves are the scalp, neck, armpits, waist, and groin. HOW CAN YOU PREVENT TICK BITES? Take these steps to help prevent tick bites when you are outdoors:  Wear long sleeves and long pants.  Wear white clothes so you can see ticks more easily.  Tuck your pant legs into your socks.  If walking on a trail, stay in the middle of the trail to avoid brushing against bushes.  Avoid walking through areas with long grass.  Put bug spray on all skin that is showing and along boot tops, pant legs, and sleeve cuffs.  Check clothes, hair, and skin often and before going inside.  Brush off any ticks that are not attached.  Take a shower or bath as soon as possible after being outdoors. HOW SHOULD YOU REMOVE A TICK? Ticks should be removed as soon as possible to help prevent diseases. 1. If latex gloves are available, put them on before trying to remove a tick. 2. Use tweezers to grasp the tick as close to the skin as possible. You may also use curved forceps or a tick removal tool. Grasp the tick as close to its head as possible. Avoid grasping the tick on its body. 3. Pull gently upward until the tick lets go. Do not twist the tick or jerk it suddenly. This may break off the tick's head or mouth parts. 4. Do not squeeze or crush the tick's body. This could force disease-carrying fluids from the tick into your body. 5. After the tick is removed, wash the bite area and your hands with soap and water or alcohol. 6. Apply a small amount of antiseptic cream or ointment to the bite site. 7. Wash any tools that were used. Do not try to remove a tick by applying a hot match, petroleum jelly, or fingernail polish to the tick. These  methods do not work. They may also increase the chances of disease being spread from the tick bite. WHEN SHOULD YOU SEEK HELP? Contact your health care provider if you are unable to remove a tick or if a part of the tick breaks off in the skin. After a tick bite, you need to watch for signs and symptoms of diseases that can be spread by ticks. Contact your health care provider if you develop any of the following:  Fever.  Rash.  Redness and puffiness (swelling) in the area of the tick bite.  Tender, puffy lymph glands.  Watery poop (diarrhea).  Weight loss.  Cough.  Feeling more tired than normal (fatigue).  Muscle, joint, or bone pain.  Belly (abdominal) pain.  Headache.  Change in your level of consciousness.  Trouble walking or moving your legs.  Loss of feeling (numbness) in the legs.  Loss of movement (paralysis).  Shortness of breath.  Confusion.  Throwing up (vomiting) many times. This information is not intended to replace advice given to you by your health care provider. Make sure you discuss any questions you have with your health care provider. Document Released: 05/18/2009 Document Revised: 07/30/2015 Document Reviewed: 08/01/2012 Elsevier Interactive Patient Education  2017 [employer]vier Inc.  

## 2016-07-11 ENCOUNTER — Other Ambulatory Visit: Payer: Self-pay | Admitting: Family Medicine

## 2016-07-12 DIAGNOSIS — R69 Illness, unspecified: Secondary | ICD-10-CM | POA: Diagnosis not present

## 2016-07-12 DIAGNOSIS — Z86718 Personal history of other venous thrombosis and embolism: Secondary | ICD-10-CM | POA: Diagnosis not present

## 2016-07-12 DIAGNOSIS — G4700 Insomnia, unspecified: Secondary | ICD-10-CM | POA: Diagnosis not present

## 2016-07-12 DIAGNOSIS — D696 Thrombocytopenia, unspecified: Secondary | ICD-10-CM | POA: Diagnosis not present

## 2016-07-12 DIAGNOSIS — Z79899 Other long term (current) drug therapy: Secondary | ICD-10-CM | POA: Diagnosis not present

## 2016-07-12 DIAGNOSIS — Z9484 Stem cells transplant status: Secondary | ICD-10-CM | POA: Diagnosis not present

## 2016-07-12 DIAGNOSIS — C9101 Acute lymphoblastic leukemia, in remission: Secondary | ICD-10-CM | POA: Diagnosis not present

## 2016-07-12 DIAGNOSIS — D89811 Chronic graft-versus-host disease: Secondary | ICD-10-CM | POA: Diagnosis not present

## 2016-07-12 DIAGNOSIS — J42 Unspecified chronic bronchitis: Secondary | ICD-10-CM | POA: Diagnosis not present

## 2016-07-12 DIAGNOSIS — D801 Nonfamilial hypogammaglobulinemia: Secondary | ICD-10-CM | POA: Diagnosis not present

## 2016-07-12 DIAGNOSIS — K5900 Constipation, unspecified: Secondary | ICD-10-CM | POA: Diagnosis not present

## 2016-07-13 DIAGNOSIS — R69 Illness, unspecified: Secondary | ICD-10-CM | POA: Diagnosis not present

## 2016-07-16 DIAGNOSIS — R0902 Hypoxemia: Secondary | ICD-10-CM | POA: Diagnosis not present

## 2016-07-18 DIAGNOSIS — J479 Bronchiectasis, uncomplicated: Secondary | ICD-10-CM | POA: Diagnosis not present

## 2016-07-19 ENCOUNTER — Ambulatory Visit (INDEPENDENT_AMBULATORY_CARE_PROVIDER_SITE_OTHER): Payer: Medicare HMO | Admitting: Family Medicine

## 2016-07-19 ENCOUNTER — Encounter: Payer: Self-pay | Admitting: Family Medicine

## 2016-07-19 VITALS — BP 110/70 | HR 77 | Ht 69.0 in | Wt 199.0 lb

## 2016-07-19 DIAGNOSIS — R531 Weakness: Secondary | ICD-10-CM | POA: Diagnosis not present

## 2016-07-19 DIAGNOSIS — D500 Iron deficiency anemia secondary to blood loss (chronic): Secondary | ICD-10-CM

## 2016-07-19 DIAGNOSIS — I5042 Chronic combined systolic (congestive) and diastolic (congestive) heart failure: Secondary | ICD-10-CM | POA: Diagnosis not present

## 2016-07-19 DIAGNOSIS — E119 Type 2 diabetes mellitus without complications: Secondary | ICD-10-CM | POA: Diagnosis not present

## 2016-07-19 LAB — FINGERSTICK HEMOGLOBIN: HEMOGLOBIN: 10.1 g/dL — AB (ref 12.6–17.7)

## 2016-07-19 LAB — BAYER DCA HB A1C WAIVED: HB A1C (BAYER DCA - WAIVED): 5.6 % (ref <7.0)

## 2016-07-19 NOTE — Progress Notes (Signed)
Subjective:  Patient ID: Derek Shepherd, male    DOB: [birth year]  Age: 61 y.o. MRN: [id number]  CC: Anemia (pt here today for follow up on his anemia. He is also c/o some recent nausea/vomiting.)   HPI THOMS BARTHELEMY presents for Patient continues with shortness of breath. He knows that his pulse ox is good. He has been told by pulmonology at [facility] that there is nothing more they can do for him. The nausea and vomiting has resolved with normal bowel movements and appetite since for 5 days ago.Marland Kitchen He is primarily concerned about his weakness and anemia.  Follow-up of diabetes. Patient does not check blood sugar at home Patient denies symptoms such as polyuria, polydipsia, excessive hunger, nausea No significant hypoglycemic spells noted. Medications as noted below. Taking them regularly without complication/adverse reaction being reported today.   Patient is here also because he had a hemoglobin of 7.7 at [facility] and had to be given fluids with potassium according to the wife. Unfortunately we could not access those records from [facility] to care everywhere today due to unknown system glitch.    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   He has a past surgical history that includes Cholecystectomy; Lung surgery (Left); Exploratory laparotomy; Hernia repair; Colonoscopy w/ biopsies; and Bone marrow transplant (2011).   His family history includes Clotting  disorder in his mother; Diabetes in his brother, father, maternal aunt, and sister; Heart attack in his father and mother; Prostate cancer in his brother.He reports that he quit smoking about 23 years ago. His smoking use included Cigarettes. He has never used smokeless tobacco. He reports that he drinks alcohol. He reports that he does not use drugs.    ROS Review of Systems  Constitutional: Positive for activity change (getting weaker and less mobile). Negative for chills, diaphoresis and fever.  HENT: Negative for rhinorrhea and sore throat.   Respiratory: Positive for shortness of breath and wheezing. Negative for cough.   Cardiovascular: Negative for chest pain.  Gastrointestinal: Negative for abdominal pain.  Musculoskeletal: Negative for arthralgias and myalgias.  Skin: Negative for rash.  Neurological: Positive for weakness (nonfocal). Negative for headaches.    Objective:  BP 110/70   Pulse 77   Ht 5' 9"  (1.753 m)   Wt 199 lb (90.3 kg)   SpO2 98%   BMI 29.39 kg/m   BP Readings from Last 3 Encounters:  07/19/16 110/70  07/07/16 121/70  05/31/16 125/79    Wt Readings from Last 3 Encounters:  07/19/16 199 lb (90.3 kg)  07/07/16 204 lb 3.2 oz (92.6 kg)  05/31/16 206 lb (93.4 kg)     Physical Exam   Results for orders placed or performed in visit on 07/19/16  Fingerstick Hemoglobin  Result Value Ref Range   Hemoglobin 10.1 (L) 12.6 - 17.7 g/dL  Bayer DCA Hb A1c Waived  Result Value Ref Range   Bayer DCA Hb A1c Waived 5.6 <7.0 %    Assessment & Plan:   [place] was seen today for anemia.  Diagnoses and all orders for this visit:  Blood loss anemia -  Fingerstick Hemoglobin -     CBC with Differential/Platelet  Controlled type 2 diabetes mellitus without complication, without long-term current use of insulin (HCC) -     CBC with Differential/Platelet -     CMP14+EGFR -     Bayer DCA Hb A1c Waived -     Microalbumin / creatinine urine ratio  Chronic  combined systolic and diastolic CHF (congestive heart failure) (HCC) -     Brain natriuretic peptide  [place] -     Brain natriuretic peptide       I have discontinued Mr. Moseley's levofloxacin. I am also having him maintain his vitamin B-12, fluticasone, montelukast, budesonide-formoterol, lactulose, multivitamin with minerals, Polyethyl Glycol-Propyl Glycol, acidophilus, senna, torsemide, nefazodone, citalopram, mycophenolate, potassium chloride, diphenoxylate-atropine, rOPINIRole, folic acid, umeclidinium bromide, Sirolimus, metFORMIN, glucose blood, and levalbuterol.  Allergies as of 07/19/2016      Reactions   Nsaids Shortness Of Breath      Medication List       Accurate as of 07/19/16  6:18 PM. Always use your most recent med list.          acidophilus Caps capsule Take 1 capsule by mouth daily.   budesonide-formoterol 160-4.5 MCG/ACT inhaler Commonly known as:  SYMBICORT Inhale 2 puffs into the lungs 2 (two) times daily.   citalopram 40 MG tablet Commonly known as:  CELEXA Take 1 tablet (40 mg total) by mouth daily. Reduce to 1/2 daily while taking fluconazole (2 weeks)   diphenoxylate-atropine 2.5-0.025 MG tablet Commonly known as:  LOMOTIL Take 2 tablets by mouth 4 (four) times daily as needed for diarrhea or loose stools.   fluticasone 50 MCG/ACT nasal spray Commonly known as:  FLONASE Place 1 spray into both nostrils daily.   folic acid 1 MG tablet Commonly known as:  FOLVITE TAKE ONE TABLET BY MOUTH ONCE DAILY   glucose blood test strip Use as instructed   lactulose 10 GM/15ML solution Commonly known as:  CHRONULAC Take 20 g by mouth 2 (two) times daily as needed for mild constipation.   levalbuterol 1.25 MG/3ML nebulizer solution Commonly known as:  XOPENEX USE ONE VIAL IN NEBULIZER 4 TIMES DAILY   metFORMIN 500 MG tablet Commonly known as:  GLUCOPHAGE TAKE ONE TABLET BY MOUTH TWICE DAILY WITH A MEAL   montelukast 10 MG tablet Commonly  known as:  SINGULAIR Take 1 tablet (10 mg total) by mouth daily.   multivitamin with minerals Tabs tablet Take 1 tablet by mouth daily.   mycophenolate 500 MG tablet Commonly known as:  CELLCEPT Take 500 mg by mouth.   nefazodone 50 MG tablet Commonly known as:  SERZONE Take 1 tablet (50 mg total) by mouth at bedtime.   potassium chloride 10 MEQ CR capsule Commonly known as:  MICRO-K Take 2 tablets twice daily   rOPINIRole 3 MG tablet Commonly known as:  REQUIP Take 1 tablet (3 mg total) by mouth at bedtime.   senna 8.6 MG tablet Commonly known as:  SENOKOT Take 2 tablets by mouth daily as needed for constipation.   Sirolimus 0.5 MG tablet Commonly known as:  RAPAMUNE TAKE ONE TABLET BY MOUTH ONCE DAILY   SYSTANE 0.4-0.3 % Soln Generic drug:  Polyethyl Glycol-Propyl Glycol Place 1 drop into both eyes as needed (for dry eyes).   torsemide 100 MG tablet Commonly known as:  DEMADEX Take 1 tablet (100 mg total) by mouth daily.   umeclidinium bromide 62.5 MCG/INH Aepb Commonly known as:  INCRUSE ELLIPTA Inhale 1 puff  into the lungs daily.   vitamin B-12 1000 MCG tablet Commonly known as:  CYANOCOBALAMIN Take 1,000 mcg by mouth daily.        Follow-up: Return in about 1 month (around 08/19/2016).  Claretta Fraise, M.D.

## 2016-07-20 LAB — CBC WITH DIFFERENTIAL/PLATELET
BASOS ABS: 0.1 10*3/uL (ref 0.0–0.2)
Basos: 1 %
EOS (ABSOLUTE): 0.1 10*3/uL (ref 0.0–0.4)
Eos: 1 %
HEMOGLOBIN: 8.3 g/dL — AB (ref 13.0–17.7)
Hematocrit: 25.2 % — ABNORMAL LOW (ref 37.5–51.0)
Immature Grans (Abs): 0 10*3/uL (ref 0.0–0.1)
Immature Granulocytes: 0 %
LYMPHS ABS: 1.1 10*3/uL (ref 0.7–3.1)
LYMPHS: 11 %
MCH: 34.6 pg — ABNORMAL HIGH (ref 26.6–33.0)
MCHC: 32.9 g/dL (ref 31.5–35.7)
MCV: 105 fL — ABNORMAL HIGH (ref 79–97)
MONOCYTES: 9 %
Monocytes Absolute: 0.9 10*3/uL (ref 0.1–0.9)
Neutrophils Absolute: 8.1 10*3/uL — ABNORMAL HIGH (ref 1.4–7.0)
Neutrophils: 78 %
PLATELETS: 133 10*3/uL — AB (ref 150–379)
RBC: 2.4 x10E6/uL — AB (ref 4.14–5.80)
RDW: 15.6 % — ABNORMAL HIGH (ref 12.3–15.4)
WBC: 10.3 10*3/uL (ref 3.4–10.8)

## 2016-07-20 LAB — CMP14+EGFR
ALBUMIN: 4.1 g/dL (ref 3.5–5.5)
ALK PHOS: 86 IU/L (ref 39–117)
ALT: 18 IU/L (ref 0–44)
AST: 17 IU/L (ref 0–40)
Albumin/Globulin Ratio: 2 (ref 1.2–2.2)
BUN / CREAT RATIO: 18 (ref 9–20)
BUN: 14 mg/dL (ref 6–24)
Bilirubin Total: <0.2 mg/dL (ref 0.0–1.2)
CO2: 34 mmol/L — AB (ref 18–29)
CREATININE: 0.8 mg/dL (ref 0.76–1.27)
Calcium: 9.3 mg/dL (ref 8.7–10.2)
Chloride: 89 mmol/L — ABNORMAL LOW (ref 96–106)
GFR calc Af Amer: 114 mL/min/{1.73_m2} (ref >59)
GFR calc non Af Amer: 98 mL/min/{1.73_m2} (ref >59)
GLUCOSE: 104 mg/dL — AB (ref 65–99)
Globulin, Total: 2.1 g/dL (ref 1.5–4.5)
Potassium: 3.7 mmol/L (ref 3.5–5.2)
Sodium: 142 mmol/L (ref 134–144)
Total Protein: 6.2 g/dL (ref 6.0–8.5)

## 2016-07-20 LAB — BRAIN NATRIURETIC PEPTIDE: BNP: 25 pg/mL (ref 0.0–100.0)

## 2016-07-25 ENCOUNTER — Telehealth: Payer: Self-pay | Admitting: Family Medicine

## 2016-07-25 NOTE — Telephone Encounter (Signed)
I would like to see him

## 2016-07-25 NOTE — Telephone Encounter (Signed)
Please advise 

## 2016-07-25 NOTE — Telephone Encounter (Signed)
appt made for tomorrow

## 2016-07-25 NOTE — Telephone Encounter (Signed)
What symptoms do you have? Bad cough. Hurts throat and chest to cough  How long have you been sick? Two weeks  Have you been seen for this problem? yes  If your provider decides to give you a prescription, which pharmacy would you like for it to be sent to? [facility] in [place].   Patient informed that this information will be sent to the clinical staff for review and that they should receive a follow up call.

## 2016-07-26 ENCOUNTER — Encounter: Payer: Self-pay | Admitting: Family Medicine

## 2016-07-26 ENCOUNTER — Ambulatory Visit (INDEPENDENT_AMBULATORY_CARE_PROVIDER_SITE_OTHER): Payer: Medicare HMO | Admitting: Family Medicine

## 2016-07-26 VITALS — BP 108/65 | HR 84 | Temp 98.4°F | Ht 69.0 in | Wt 197.0 lb

## 2016-07-26 DIAGNOSIS — J431 Panlobular emphysema: Secondary | ICD-10-CM | POA: Diagnosis not present

## 2016-07-26 DIAGNOSIS — Z9481 Bone marrow transplant status: Secondary | ICD-10-CM

## 2016-07-26 DIAGNOSIS — J441 Chronic obstructive pulmonary disease with (acute) exacerbation: Secondary | ICD-10-CM

## 2016-07-26 DIAGNOSIS — D649 Anemia, unspecified: Secondary | ICD-10-CM

## 2016-07-26 DIAGNOSIS — C9101 Acute lymphoblastic leukemia, in remission: Secondary | ICD-10-CM

## 2016-07-26 DIAGNOSIS — J42 Unspecified chronic bronchitis: Secondary | ICD-10-CM

## 2016-07-26 DIAGNOSIS — D696 Thrombocytopenia, unspecified: Secondary | ICD-10-CM | POA: Diagnosis not present

## 2016-07-26 DIAGNOSIS — I2782 Chronic pulmonary embolism: Secondary | ICD-10-CM

## 2016-07-26 MED ORDER — BETAMETHASONE SOD PHOS & ACET 6 (3-3) MG/ML IJ SUSP
6.0000 mg | Freq: Once | INTRAMUSCULAR | Status: AC
  Administered 2016-07-26: 6 mg via INTRAMUSCULAR

## 2016-07-26 MED ORDER — METHYLPREDNISOLONE 8 MG PO TABS
8.0000 mg | ORAL_TABLET | Freq: Every day | ORAL | 2 refills | Status: DC
Start: 2016-07-26 — End: 2016-10-13

## 2016-07-26 MED ORDER — LEVOFLOXACIN 500 MG PO TABS: 500.0000 mg | ORAL_TABLET | Freq: Every day | ORAL | 0 refills | Status: DC

## 2016-07-26 NOTE — Progress Notes (Signed)
Subjective:  Patient ID: Derek Shepherd, male    DOB: 1955/08/23  Age: 61 y.o. MRN: [id number]  CC: Cough (pt here today c/o cough that has persisted but has gotten worse in the last couple of days)    HPI Derek Shepherd presents for 1  week of increasing cough. He is using 5 L nasal cannula. She is using inhaled Symbicort and finishing up his Spiriva before changing to increase. He continues to be short of breath. He has a profuse productive cough. His wife is here and gives history that his cough is getting far worse over the last week or so during his sleep. He sounds very congested and sounds like he is going to stop breathing at times. He has a baseline significant cough due to his COPD and bronchiolitis obliterans. He also has a significant problem with anemia. 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 He is followed by [facility] for these problems. However they are not willing to give him transfusions due to fear of iron overload. Unfortunately they have not offered him anything else with regard to relief of his cough and shortness of breath. He is a diabetic. His blood sugars have been running reasonably good lately. He does not bring a log. Doesn't remember the readings. Patient expresses frustration over not being transfused since it helps him feel better.    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   He has a past surgical history that includes Cholecystectomy; Lung surgery (Left); Exploratory laparotomy; Hernia repair; Colonoscopy w/ biopsies; and Bone marrow transplant (2011).   His family history includes Clotting disorder in his mother; Diabetes in his brother, father, maternal aunt, and sister; Heart attack in his father and mother; Prostate cancer in his brother.He reports that he quit smoking about 23 years ago. His smoking use included Cigarettes. He has never used smokeless tobacco. He reports that he drinks alcohol. He reports that he does not use drugs.  Current Outpatient Prescriptions on File Prior to Visit  Medication Sig Dispense Refill  . acidophilus (RISAQUAD) CAPS capsule Take 1 capsule by mouth daily.    . budesonide-formoterol (SYMBICORT) 160-4.5 MCG/ACT inhaler Inhale 2 puffs into the lungs 2 (two) times daily.     . citalopram (CELEXA) 40 MG tablet Take 1 tablet (40 mg total) by mouth daily. Reduce to 1/2 daily while taking fluconazole (2 weeks) 30 tablet 5  . diphenoxylate-atropine (LOMOTIL) 2.5-0.025 MG tablet Take 2 tablets by mouth 4 (four) times daily as needed for diarrhea or loose stools. 30 tablet 0  . fluticasone (FLONASE) 50 MCG/ACT nasal spray Place 1 spray into both nostrils daily. 16 g 1  . folic acid (FOLVITE) 1 MG tablet TAKE ONE TABLET BY MOUTH ONCE DAILY 90 tablet 1  . glucose blood test strip Use as instructed 100 each 12  . lactulose (CHRONULAC) 10 GM/15ML solution Take 20 g by mouth 2 (two) times daily as needed for mild constipation.    Marland Kitchen levalbuterol (XOPENEX) 1.25 MG/3ML nebulizer solution USE ONE VIAL IN NEBULIZER 4 TIMES DAILY  144 mL 3  . metFORMIN (GLUCOPHAGE) 500 MG tablet TAKE ONE TABLET BY MOUTH TWICE DAILY WITH A MEAL 180 tablet 0  . montelukast (SINGULAIR) 10 MG tablet Take 1 tablet (10 mg total) by mouth daily. 90 tablet 0  . Multiple Vitamin (MULTIVITAMIN WITH MINERALS) TABS tablet Take 1 tablet by mouth daily.      . mycophenolate (CELLCEPT) 500 MG tablet Take 500 mg by mouth.    . nefazodone (SERZONE) 50 MG tablet Take 1 tablet (50 mg total) by mouth at bedtime. 90 tablet 0  . Polyethyl Glycol-Propyl Glycol (SYSTANE) 0.4-0.3 % SOLN Place 1 drop into both eyes as needed (for dry eyes).    . potassium chloride (MICRO-K) 10 MEQ CR capsule Take 2 tablets twice daily    . rOPINIRole (REQUIP) 3 MG tablet Take 1 tablet (3 mg total) by mouth at bedtime. 30 tablet 2  . senna (SENOKOT) 8.6 MG tablet Take 2 tablets by mouth daily as needed for constipation.    . Sirolimus 0.5 MG TABS TAKE ONE TABLET BY MOUTH ONCE DAILY 90 tablet 3  . torsemide (DEMADEX) 100 MG tablet Take 1 tablet (100 mg total) by mouth daily. (Patient taking differently: Take 50 mg by mouth daily. ) 30 tablet 11  . umeclidinium bromide (INCRUSE ELLIPTA) 62.5 MCG/INH AEPB Inhale 1 puff into the lungs daily. 30 each 11  . vitamin B-12 (CYANOCOBALAMIN) 1000 MCG tablet Take 1,000 mcg by mouth daily.      No current facility-administered medications on file prior to visit.     ROS Review of Systems  Constitutional: Positive for fatigue. Negative for chills, diaphoresis, fever and unexpected weight change.  HENT: Negative for congestion, hearing loss, rhinorrhea and sore throat.   Eyes: Negative for visual disturbance.  Respiratory: Positive for cough, shortness of breath and wheezing.   Cardiovascular: Negative for chest pain.  Gastrointestinal: Negative for abdominal pain, constipation and diarrhea.  Genitourinary: Negative for dysuria and flank pain.  Musculoskeletal: Negative for arthralgias and joint swelling.  Skin: Negative for rash.  Neurological: Positive for weakness, light-headedness and headaches. Negative for dizziness and syncope.  Psychiatric/Behavioral: Negative for dysphoric mood and sleep disturbance.    Objective:  BP 108/65   Pulse 84   Temp 98.4 F (36.9 C) (Oral)   Ht 5\' 9"  (1.753 m)   Wt 197 lb (89.4 kg)   SpO2  98%   BMI 29.09 kg/m   Physical Exam  Constitutional: He is oriented to person, place, and time. He appears well-developed. No distress.  HENT:  Head: Normocephalic and atraumatic.  Right Ear: External ear normal.  Left Ear: External ear normal.  Nose: Nose normal.  Mouth/Throat: Oropharynx is clear and moist.  Eyes: Conjunctivae and EOM are normal. Pupils are equal, round, and reactive to light.  Neck: Normal range of motion. Neck supple. No thyromegaly present.  Cardiovascular: Normal rate, regular rhythm and normal heart sounds.   No murmur heard. Pulmonary/Chest: He is in respiratory distress. He has wheezes. He has no rales.  Abdominal: Soft. Bowel sounds are normal. He exhibits no distension. There is no tenderness.  Musculoskeletal: He exhibits no edema.  Lymphadenopathy:    He has no cervical adenopathy.  Neurological: He is alert and oriented to person, place, and time. He has normal reflexes.  Skin: Skin is warm. He is diaphoretic.  Psychiatric: He has a normal mood and affect. His behavior is normal.    Assessment & Plan:   Teancum was seen today for  cough.  Diagnoses and all orders for this visit:  Panlobular emphysema ([place]) -     CBC with Differential/Platelet -     betamethasone acetate-betamethasone sodium phosphate (CELESTONE) injection 6 mg; Inject 1 mL (6 mg total) into the muscle once.  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  Acute  exacerbation of chronic obstructive pulmonary disease (COPD) (HCC) -     betamethasone acetate-betamethasone sodium phosphate (CELESTONE) injection 6 mg; Inject 1 mL (6 mg total) into the muscle once.  Other orders -     levofloxacin (LEVAQUIN) 500 MG tablet; Take 1 tablet (500 mg total) by mouth daily. -     methylPREDNISolone (MEDROL) 8 MG tablet; Take 1 tablet (8 mg total) by mouth daily.   I am having Mr. Forbis start on levofloxacin and methylPREDNISolone. I am also having him maintain his vitamin B-12, fluticasone, montelukast, budesonide-formoterol, lactulose, multivitamin with minerals, Polyethyl Glycol-Propyl Glycol, acidophilus, senna, torsemide, nefazodone, citalopram, mycophenolate, potassium chloride, diphenoxylate-atropine, rOPINIRole, folic acid, umeclidinium bromide, Sirolimus, metFORMIN, glucose blood, and levalbuterol. We will continue to administer betamethasone acetate-betamethasone sodium phosphate.  Meds ordered this encounter  Medications  . betamethasone acetate-betamethasone sodium phosphate (CELESTONE) injection 6 mg  . levofloxacin (LEVAQUIN) 500 MG tablet    Sig: Take 1 tablet (500 mg total) by mouth daily.    Dispense:  7 tablet    Refill:  0  . methylPREDNISolone (MEDROL) 8 MG tablet    Sig: Take 1 tablet (8 mg total) by mouth daily.    Dispense:  30 tablet    Refill:  2  I helped him to understand that if he is not iron deficient chronic transfusions can lead to iron overload. That may be a significant issue for him. I am curious whether that has been the thinking of hematology at [facility]. Follow-up: Return in about 1 week (around 08/02/2016).  Claretta Fraise, M.D.

## 2016-07-27 ENCOUNTER — Encounter (HOSPITAL_COMMUNITY)
Admission: RE | Admit: 2016-07-27 | Discharge: 2016-07-27 | Disposition: A | Discharge status: Home / Self Care | Payer: Medicare HMO | Source: Ambulatory Visit | Attending: Family Medicine | Admitting: Family Medicine

## 2016-07-27 DIAGNOSIS — D500 Iron deficiency anemia secondary to blood loss (chronic): Secondary | ICD-10-CM | POA: Diagnosis present

## 2016-07-27 DIAGNOSIS — J449 Chronic obstructive pulmonary disease, unspecified: Secondary | ICD-10-CM | POA: Diagnosis not present

## 2016-07-27 DIAGNOSIS — D649 Anemia, unspecified: Secondary | ICD-10-CM | POA: Diagnosis not present

## 2016-07-27 DIAGNOSIS — I509 Heart failure, unspecified: Secondary | ICD-10-CM | POA: Insufficient documentation

## 2016-07-27 LAB — CBC WITH DIFFERENTIAL/PLATELET
BASOS: 1 %
Basophils Absolute: 0.1 10*3/uL (ref 0.0–0.2)
EOS (ABSOLUTE): 0.2 10*3/uL (ref 0.0–0.4)
EOS: 1 %
HEMOGLOBIN: 8 g/dL — AB (ref 13.0–17.7)
Hematocrit: 25.2 % — ABNORMAL LOW (ref 37.5–51.0)
IMMATURE GRANULOCYTES: 1 %
Immature Grans (Abs): 0.1 10*3/uL (ref 0.0–0.1)
LYMPHS ABS: 1.9 10*3/uL (ref 0.7–3.1)
Lymphs: 14 %
MCH: 33.5 pg — ABNORMAL HIGH (ref 26.6–33.0)
MCHC: 31.7 g/dL (ref 31.5–35.7)
MCV: 105 fL — ABNORMAL HIGH (ref 79–97)
MONOCYTES: 7 %
Monocytes Absolute: 1 10*3/uL — ABNORMAL HIGH (ref 0.1–0.9)
Neutrophils Absolute: 9.8 10*3/uL — ABNORMAL HIGH (ref 1.4–7.0)
Neutrophils: 76 %
Platelets: 155 10*3/uL (ref 150–379)
RBC: 2.39 x10E6/uL — AB (ref 4.14–5.80)
RDW: 15.2 % (ref 12.3–15.4)
WBC: 13 10*3/uL — AB (ref 3.4–10.8)

## 2016-07-27 LAB — PREPARE RBC (CROSSMATCH)

## 2016-07-27 LAB — HEMOGLOBIN AND HEMATOCRIT, BLOOD
HCT: 26.6 % — ABNORMAL LOW (ref 39.0–52.0)
Hemoglobin: 8.4 g/dL — ABNORMAL LOW (ref 13.0–17.0)

## 2016-07-27 NOTE — Progress Notes (Signed)
Per our discussion: His hemoglobin continues to drop significantly and he is in distress from shortness of breath in addition to his ongoing respiratory illnesses of COPD and CHF. Therefore I think the transfusion is justified at least this once. We will follow iron levels closely. Please add an iron level has also discussed.  I appreciate your input. Thanks very much. [employer]

## 2016-07-27 NOTE — Addendum Note (Signed)
Addended by: Marylin Crosby on: 07/27/2016 05:05 PM   Modules accepted: Orders

## 2016-07-28 ENCOUNTER — Encounter (HOSPITAL_COMMUNITY)
Admission: RE | Admit: 2016-07-28 | Discharge: 2016-07-28 | Disposition: A | Discharge status: Home / Self Care | Payer: Medicare HMO | Source: Ambulatory Visit | Attending: Family Medicine | Admitting: Family Medicine

## 2016-07-28 ENCOUNTER — Encounter (HOSPITAL_COMMUNITY): Payer: Self-pay

## 2016-07-28 DIAGNOSIS — D500 Iron deficiency anemia secondary to blood loss (chronic): Secondary | ICD-10-CM | POA: Diagnosis not present

## 2016-07-28 DIAGNOSIS — I509 Heart failure, unspecified: Secondary | ICD-10-CM | POA: Diagnosis not present

## 2016-07-28 DIAGNOSIS — J449 Chronic obstructive pulmonary disease, unspecified: Secondary | ICD-10-CM | POA: Diagnosis not present

## 2016-07-28 MED ORDER — FUROSEMIDE 10 MG/ML IJ SOLN
20.0000 mg | Freq: Once | INTRAMUSCULAR | Status: AC
Start: 2016-07-28 — End: 2016-07-28
  Administered 2016-07-28: 20 mg via INTRAVENOUS
  Filled 2016-07-28: qty 4

## 2016-07-28 MED ORDER — HEPARIN SOD (PORK) LOCK FLUSH 100 UNIT/ML IV SOLN
500.0000 [IU] | INTRAVENOUS | Status: AC | PRN
  Administered 2016-07-28: 500 [IU]
  Filled 2016-07-28: qty 5

## 2016-07-28 MED ORDER — SODIUM CHLORIDE 0.9 % IV SOLN
Freq: Once | INTRAVENOUS | Status: AC
  Administered 2016-07-28: 250 mL via INTRAVENOUS

## 2016-07-28 MED ORDER — SODIUM CHLORIDE 0.9% FLUSH
10.0000 mL | INTRAVENOUS | Status: AC | PRN
  Administered 2016-07-28: 10 mL
  Filled 2016-07-28: qty 10

## 2016-07-28 NOTE — Progress Notes (Signed)
Results for ALA, CAPRI (MRN [id number]) as of 07/28/2016 11:22  Ref. Range 07/27/2016 13:38 07/27/2016 13:38  Hemoglobin Latest Ref Range: 13.0 - 17.0 g/dL 8.4 (L)   HCT Latest Ref Range: 39.0 - 52.0 % 26.6 (L)    Lab work Tuesday, Jul 27, 2016 for blood transfusion on Thursday.

## 2016-07-28 NOTE — Discharge Instructions (Signed)
Blood Transfusion , Adult A blood transfusion is a procedure in which you receive donated blood, including plasma, platelets, and red blood cells, through an IV tube. You may need a blood transfusion because of illness, surgery, or injury. The blood may come from a donor. You may also be able to donate blood for yourself (autologous blood donation) before a surgery if you know that you might require a blood transfusion. The blood given in a transfusion is made up of different types of cells. You may receive:  Red blood cells. These carry oxygen to the cells in the body.  White blood cells. These help you fight infections.  Platelets. These help your blood to clot.  Plasma. This is the liquid part of your blood and it helps with fluid imbalances. If you have hemophilia or another clotting disorder, you may also receive other types of blood products. Tell a health care provider about:  Any allergies you have.  All medicines you are taking, including vitamins, herbs, eye drops, creams, and over-the-counter medicines.  Any problems you or family members have had with anesthetic medicines.  Any blood disorders you have.  Any surgeries you have had.  Any medical conditions you have, including any recent fever or cold symptoms.  Whether you are pregnant or may be pregnant.  Any previous reactions you have had during a blood transfusion. What are the risks? Generally, this is a safe procedure. However, problems may occur, including:  Having an allergic reaction to something in the donated blood. Hives and itching may be symptoms of this type of reaction.  Fever. This may be a reaction to the white blood cells in the transfused blood. Nausea or chest pain may accompany a fever.  Iron overload. This can happen from having many transfusions.  Transfusion-related acute lung injury (TRALI). This is a rare reaction that causes lung damage. The cause is not known.TRALI can occur within hours  of a transfusion or several days later.  Sudden (acute) or delayed hemolytic reactions. This happens if your blood does not match the cells in your transfusion. Your body's defense system (immune system) may try to attack the new cells. This complication is rare. The symptoms include fever, chills, nausea, and low back pain or chest pain.  Infection or disease transmission. This is rare. What happens before the procedure?  You will have a blood test to determine your blood type. This is necessary to know what kind of blood your body will accept and to match it to the donor blood.  If you are going to have a planned surgery, you may be able to do an autologous blood donation. This may be done in case you need to have a transfusion.  If you have had an allergic reaction to a transfusion in the past, you may be given medicine to help prevent a reaction. This medicine may be given to you by mouth or through an IV tube.  You will have your temperature, blood pressure, and pulse monitored before the transfusion.  Follow instructions from your health care provider about eating and drinking restrictions.  Ask your health care provider about:  Changing or stopping your regular medicines. This is especially important if you are taking diabetes medicines or blood thinners.  Taking medicines such as aspirin and ibuprofen. These medicines can thin your blood. Do not take these medicines before your procedure if your health care provider instructs you not to. What happens during the procedure?  An IV tube will be   inserted into one of your veins.  The bag of donated blood will be attached to your IV tube. The blood will then enter through your vein.  Your temperature, blood pressure, and pulse will be monitored regularly during the transfusion. This monitoring is done to detect early signs of a transfusion reaction.  If you have any signs or symptoms of a reaction, your transfusion will be stopped and  you may be given medicine.  When the transfusion is complete, your IV tube will be removed.  Pressure may be applied to the IV site for a few minutes.  A bandage (dressing) will be applied. The procedure may vary among health care providers and hospitals. What happens after the procedure?  Your temperature, blood pressure, heart rate, breathing rate, and blood oxygen level will be monitored often.  Your blood may be tested to see how you are responding to the transfusion.  You may be warmed with fluids or blankets to maintain a normal body temperature. Summary  A blood transfusion is a procedure in which you receive donated blood, including plasma, platelets, and red blood cells, through an IV tube.  Your temperature, blood pressure, and pulse will be monitored before, during, and after the transfusion.  Your blood may be tested after the transfusion to see how your body has responded. This information is not intended to replace advice given to you by your health care provider. Make sure you discuss any questions you have with your health care provider. Document Released: 02/19/2000 Document Revised: 11/19/2015 Document Reviewed: 11/19/2015 Elsevier Interactive Patient Education  2017 Elsevier Inc.  

## 2016-07-29 ENCOUNTER — Ambulatory Visit: Payer: Medicare HMO | Admitting: Family Medicine

## 2016-07-29 LAB — TYPE AND SCREEN
ABO/RH(D): A POS
Antibody Screen: NEGATIVE
UNIT DIVISION: 0
UNIT DIVISION: 0

## 2016-07-29 LAB — BPAM RBC
BLOOD PRODUCT EXPIRATION DATE: 201806132359
BLOOD PRODUCT EXPIRATION DATE: 201806152359
ISSUE DATE / TIME: 201805240800
ISSUE DATE / TIME: 201805240930
UNIT TYPE AND RH: 5100
Unit Type and Rh: 5100

## 2016-07-29 LAB — VITAMIN B12: Vitamin B-12: 603 pg/mL (ref 232–1245)

## 2016-07-29 LAB — IRON: Iron: 33 ug/dL — ABNORMAL LOW (ref 38–169)

## 2016-08-03 DIAGNOSIS — R0902 Hypoxemia: Secondary | ICD-10-CM | POA: Diagnosis not present

## 2016-08-05 DIAGNOSIS — Z0001 Encounter for general adult medical examination with abnormal findings: Secondary | ICD-10-CM | POA: Diagnosis not present

## 2016-08-05 DIAGNOSIS — M25472 Effusion, left ankle: Secondary | ICD-10-CM | POA: Diagnosis not present

## 2016-08-05 DIAGNOSIS — R69 Illness, unspecified: Secondary | ICD-10-CM | POA: Diagnosis not present

## 2016-08-05 DIAGNOSIS — L249 Irritant contact dermatitis, unspecified cause: Secondary | ICD-10-CM | POA: Diagnosis not present

## 2016-08-05 DIAGNOSIS — H1013 Acute atopic conjunctivitis, bilateral: Secondary | ICD-10-CM | POA: Diagnosis not present

## 2016-08-05 DIAGNOSIS — Z1379 Encounter for other screening for genetic and chromosomal anomalies: Secondary | ICD-10-CM | POA: Diagnosis not present

## 2016-08-05 DIAGNOSIS — Z0182 Encounter for allergy testing: Secondary | ICD-10-CM | POA: Diagnosis not present

## 2016-08-05 DIAGNOSIS — E639 Nutritional deficiency, unspecified: Secondary | ICD-10-CM | POA: Diagnosis not present

## 2016-08-05 DIAGNOSIS — Z79899 Other long term (current) drug therapy: Secondary | ICD-10-CM | POA: Diagnosis not present

## 2016-08-05 DIAGNOSIS — R5383 Other fatigue: Secondary | ICD-10-CM | POA: Diagnosis not present

## 2016-08-05 DIAGNOSIS — M25475 Effusion, left foot: Secondary | ICD-10-CM | POA: Diagnosis not present

## 2016-08-05 DIAGNOSIS — J4520 Mild intermittent asthma, uncomplicated: Secondary | ICD-10-CM | POA: Diagnosis not present

## 2016-08-05 DIAGNOSIS — T887XXA Unspecified adverse effect of drug or medicament, initial encounter: Secondary | ICD-10-CM | POA: Diagnosis not present

## 2016-08-05 DIAGNOSIS — R0602 Shortness of breath: Secondary | ICD-10-CM | POA: Diagnosis not present

## 2016-08-05 DIAGNOSIS — M25572 Pain in left ankle and joints of left foot: Secondary | ICD-10-CM | POA: Diagnosis not present

## 2016-08-05 DIAGNOSIS — M60862 Other myositis, left lower leg: Secondary | ICD-10-CM | POA: Diagnosis not present

## 2016-08-05 DIAGNOSIS — R51 Headache: Secondary | ICD-10-CM | POA: Diagnosis not present

## 2016-08-09 ENCOUNTER — Telehealth: Payer: Self-pay | Admitting: Family Medicine

## 2016-08-09 ENCOUNTER — Other Ambulatory Visit: Payer: Self-pay | Admitting: Family Medicine

## 2016-08-09 ENCOUNTER — Ambulatory Visit: Payer: Medicare HMO | Admitting: Family Medicine

## 2016-08-09 DIAGNOSIS — B974 Respiratory syncytial virus as the cause of diseases classified elsewhere: Secondary | ICD-10-CM | POA: Diagnosis not present

## 2016-08-09 DIAGNOSIS — D801 Nonfamilial hypogammaglobulinemia: Secondary | ICD-10-CM | POA: Diagnosis not present

## 2016-08-09 DIAGNOSIS — G8929 Other chronic pain: Secondary | ICD-10-CM | POA: Diagnosis not present

## 2016-08-09 DIAGNOSIS — I10 Essential (primary) hypertension: Secondary | ICD-10-CM | POA: Diagnosis not present

## 2016-08-09 DIAGNOSIS — D89811 Chronic graft-versus-host disease: Secondary | ICD-10-CM | POA: Diagnosis not present

## 2016-08-09 DIAGNOSIS — K5900 Constipation, unspecified: Secondary | ICD-10-CM | POA: Diagnosis not present

## 2016-08-09 DIAGNOSIS — E876 Hypokalemia: Secondary | ICD-10-CM | POA: Diagnosis not present

## 2016-08-09 DIAGNOSIS — R69 Illness, unspecified: Secondary | ICD-10-CM | POA: Diagnosis not present

## 2016-08-09 DIAGNOSIS — C9101 Acute lymphoblastic leukemia, in remission: Secondary | ICD-10-CM | POA: Diagnosis not present

## 2016-08-09 DIAGNOSIS — Z9484 Stem cells transplant status: Secondary | ICD-10-CM | POA: Diagnosis not present

## 2016-08-09 DIAGNOSIS — G4700 Insomnia, unspecified: Secondary | ICD-10-CM | POA: Diagnosis not present

## 2016-08-09 NOTE — Telephone Encounter (Signed)
Spoke to pt and advised him of MD feedback. Pt voiced understanding and will have his wife call back to schedule his 2 week appt.

## 2016-08-09 NOTE — Telephone Encounter (Signed)
Spoke to pt's wife and she states they were told at his visit today with the Hematologist to follow up with Dr Livia Snellen to monitor his HGB. When would you like them to follow up?

## 2016-08-09 NOTE — Telephone Encounter (Signed)
Please contact the patient : 2 weeks

## 2016-08-12 ENCOUNTER — Other Ambulatory Visit: Payer: Self-pay | Admitting: Family Medicine

## 2016-08-16 DIAGNOSIS — R0902 Hypoxemia: Secondary | ICD-10-CM | POA: Diagnosis not present

## 2016-08-18 ENCOUNTER — Other Ambulatory Visit: Payer: Self-pay | Admitting: Family Medicine

## 2016-08-18 DIAGNOSIS — J479 Bronchiectasis, uncomplicated: Secondary | ICD-10-CM | POA: Diagnosis not present

## 2016-08-19 ENCOUNTER — Other Ambulatory Visit: Payer: Self-pay | Admitting: Family Medicine

## 2016-08-19 DIAGNOSIS — R69 Illness, unspecified: Secondary | ICD-10-CM | POA: Diagnosis not present

## 2016-08-23 ENCOUNTER — Ambulatory Visit (INDEPENDENT_AMBULATORY_CARE_PROVIDER_SITE_OTHER): Payer: Medicare HMO | Admitting: Family

## 2016-08-23 ENCOUNTER — Encounter: Payer: Self-pay | Admitting: Family

## 2016-08-23 VITALS — BP 134/83 | HR 93 | Temp 100.4°F | Ht 69.0 in | Wt 194.0 lb

## 2016-08-23 DIAGNOSIS — R5383 Other fatigue: Secondary | ICD-10-CM

## 2016-08-23 DIAGNOSIS — I5042 Chronic combined systolic (congestive) and diastolic (congestive) heart failure: Secondary | ICD-10-CM

## 2016-08-23 DIAGNOSIS — J431 Panlobular emphysema: Secondary | ICD-10-CM | POA: Diagnosis not present

## 2016-08-23 DIAGNOSIS — R509 Fever, unspecified: Secondary | ICD-10-CM

## 2016-08-23 DIAGNOSIS — D638 Anemia in other chronic diseases classified elsewhere: Secondary | ICD-10-CM

## 2016-08-23 LAB — HEMOGLOBIN, FINGERSTICK: Hemoglobin: 10.6 g/dL — ABNORMAL LOW (ref 12.6–17.7)

## 2016-08-23 MED ORDER — DOXYCYCLINE HYCLATE 100 MG PO TABS: 100.0000 mg | ORAL_TABLET | Freq: Two times a day (BID) | ORAL | 0 refills | Status: DC

## 2016-08-23 NOTE — Progress Notes (Signed)
   Subjective:    Patient ID: Derek Shepherd, male    DOB: 1956-02-13, 61 y.o.   MRN: [id number]  HPI Pt presents to the office today with chronic fatigue. Pt has had hx of anemia and was given two units of blood on 07/29/16. Pt has CHF and emphysema. He is currently on 5L continuous. PT is followed by Cardiologists every 6 months, Hematologists/Oncologists  every month for amenia and ALL in remission, Pumologists every month for emphysema, and Pain Clinic every 3 months for chronic pain left leg/foot.   Pt has fever today and states he has an intermittent dry cough, chills, and shakiness. Denies any UTI symptoms.    Review of Systems  Constitutional: Positive for fatigue.  Respiratory: Positive for shortness of breath.   All other systems reviewed and are negative.      Objective:   Physical Exam  Constitutional: He is oriented to person, place, and time. He appears well-developed and well-nourished. No distress.  HENT:  Head: Normocephalic.  Cardiovascular: Normal rate, regular rhythm, normal heart sounds and intact distal pulses.   No murmur heard. Pulmonary/Chest: Effort normal. No respiratory distress. He has decreased breath sounds. He has no wheezes.  5L of O2  Abdominal: Soft. Bowel sounds are normal. He exhibits no distension. There is no tenderness.  Musculoskeletal: Normal range of motion. He exhibits no edema or tenderness.  Neurological: He is alert and oriented to person, place, and time.  Skin: Skin is warm and dry. No rash noted. No erythema. There is pallor.  Psychiatric: He has a normal mood and affect. His behavior is normal. Judgment and thought content normal.  Vitals reviewed.   BP 134/83   Pulse 93   Temp (!) 100.4 F (38 C) (Oral)   Ht '5\' 9"'$  (1.753 m)   Wt 194 lb (88 kg)   BMI 28.65 kg/m      Assessment & Plan:  1. Chronic combined systolic and diastolic CHF (congestive heart failure) (HCC) - Hemoglobin, fingerstick - Anemia Profile B -  CMP14+EGFR  2. Panlobular emphysema (HCC) - Hemoglobin, fingerstick - Anemia Profile B - CMP14+EGFR  3. Anemia of chronic disease - Hemoglobin, fingerstick - Anemia Profile B - CMP14+EGFR  4. Other fatigue - Hemoglobin, fingerstick - Anemia Profile B - CMP14+EGFR - doxycycline (VIBRA-TABS) 100 MG tablet; Take 1 tablet (100 mg total) by mouth 2 (two) times daily.  Dispense: 20 tablet; Refill: 0  5. Fever, unspecified fever cause - doxycycline (VIBRA-TABS) 100 MG tablet; Take 1 tablet (100 mg total) by mouth 2 (two) times daily.  Dispense: 20 tablet; Refill: 0  Keep all follow up appt with PCP and Hematologists (04/3) We do not have x-ray at this time, but will treat with doxycyline since pt has fever and cough Hgb stable at this time IF SOB or fever worsens call office or go to ED  Evelina Dun, FNP

## 2016-08-23 NOTE — Patient Instructions (Signed)
Anemia, Nonspecific Anemia is a condition in which the concentration of red blood cells or hemoglobin in the blood is below normal. Hemoglobin is a substance in red blood cells that carries oxygen to the tissues of the body. Anemia results in not enough oxygen reaching these tissues. What are the causes? Common causes of anemia include:  Excessive bleeding. Bleeding may be internal or external. This includes excessive bleeding from periods (in women) or from the intestine.  Poor nutrition.  Chronic kidney, thyroid, and liver disease.  Bone marrow disorders that decrease red blood cell production.  Cancer and treatments for cancer.  HIV, AIDS, and their treatments.  Spleen problems that increase red blood cell destruction.  Blood disorders.  Excess destruction of red blood cells due to infection, medicines, and autoimmune disorders. What are the signs or symptoms?  Minor weakness.  Dizziness.  Headache.  Palpitations.  Shortness of breath, especially with exercise.  Paleness.  Cold sensitivity.  Indigestion.  Nausea.  Difficulty sleeping.  Difficulty concentrating. Symptoms may occur suddenly or they may develop slowly. How is this diagnosed? Additional blood tests are often needed. These help your health care provider determine the best treatment. Your health care provider will check your stool for blood and look for other causes of blood loss. How is this treated? Treatment varies depending on the cause of the anemia. Treatment can include:  Supplements of iron, vitamin B12, or folic acid.  Hormone medicines.  A blood transfusion. This may be needed if blood loss is severe.  Hospitalization. This may be needed if there is significant continual blood loss.  Dietary changes.  Spleen removal. Follow these instructions at home: Keep all follow-up appointments. It often takes many weeks to correct anemia, and having your health care provider check on your  condition and your response to treatment is very important. Get help right away if:  You develop extreme weakness, shortness of breath, or chest pain.  You become dizzy or have trouble concentrating.  You develop heavy vaginal bleeding.  You develop a rash.  You have bloody or black, tarry stools.  You faint.  You vomit up blood.  You vomit repeatedly.  You have abdominal pain.  You have a fever or persistent symptoms for more than 2-3 days.  You have a fever and your symptoms suddenly get worse.  You are dehydrated. This information is not intended to replace advice given to you by your health care provider. Make sure you discuss any questions you have with your health care provider. Document Released: 03/31/2004 Document Revised: 08/05/2015 Document Reviewed: 08/17/2012 Elsevier Interactive Patient Education  2017 [employer] Inc[employer]  

## 2016-08-24 LAB — ANEMIA PROFILE B
Basophils Absolute: 0 10*3/uL (ref 0.0–0.2)
Basos: 0 %
EOS (ABSOLUTE): 0 10*3/uL (ref 0.0–0.4)
EOS: 0 %
Ferritin: 800 ng/mL — ABNORMAL HIGH (ref 30–400)
Folate: >20 ng/mL (ref >3.0)
HEMOGLOBIN: 10.5 g/dL — AB (ref 13.0–17.7)
Hematocrit: 32 % — ABNORMAL LOW (ref 37.5–51.0)
IMMATURE GRANS (ABS): 0.1 10*3/uL (ref 0.0–0.1)
IMMATURE GRANULOCYTES: 1 %
IRON SATURATION: 24 % (ref 15–55)
IRON: 61 ug/dL (ref 38–169)
LYMPHS: 13 %
Lymphocytes Absolute: 1.2 10*3/uL (ref 0.7–3.1)
MCH: 32.2 pg (ref 26.6–33.0)
MCHC: 32.8 g/dL (ref 31.5–35.7)
MCV: 98 fL — ABNORMAL HIGH (ref 79–97)
MONOCYTES: 6 %
Monocytes Absolute: 0.6 10*3/uL (ref 0.1–0.9)
NEUTROS PCT: 80 %
Neutrophils Absolute: 7.5 10*3/uL — ABNORMAL HIGH (ref 1.4–7.0)
Platelets: 154 10*3/uL (ref 150–379)
RBC: 3.26 x10E6/uL — ABNORMAL LOW (ref 4.14–5.80)
RDW: 15.6 % — ABNORMAL HIGH (ref 12.3–15.4)
Retic Ct Pct: 1.7 % (ref 0.6–2.6)
TIBC: 251 ug/dL (ref 250–450)
UIBC: 190 ug/dL (ref 111–343)
Vitamin B-12: 384 pg/mL (ref 232–1245)
WBC: 9.5 10*3/uL (ref 3.4–10.8)

## 2016-08-24 LAB — CMP14+EGFR
ALBUMIN: 4.6 g/dL (ref 3.5–5.5)
ALT: 19 IU/L (ref 0–44)
AST: 21 IU/L (ref 0–40)
Albumin/Globulin Ratio: 2.4 — ABNORMAL HIGH (ref 1.2–2.2)
Alkaline Phosphatase: 61 IU/L (ref 39–117)
BUN/Creatinine Ratio: 17 (ref 9–20)
BUN: 16 mg/dL (ref 6–24)
Bilirubin Total: 0.2 mg/dL (ref 0.0–1.2)
CALCIUM: 9.8 mg/dL (ref 8.7–10.2)
CO2: 31 mmol/L — AB (ref 20–29)
CREATININE: 0.95 mg/dL (ref 0.76–1.27)
Chloride: 88 mmol/L — ABNORMAL LOW (ref 96–106)
GFR calc Af Amer: 102 mL/min/{1.73_m2} (ref >59)
GFR calc non Af Amer: 88 mL/min/{1.73_m2} (ref >59)
GLOBULIN, TOTAL: 1.9 g/dL (ref 1.5–4.5)
Glucose: 93 mg/dL (ref 65–99)
Potassium: 4.1 mmol/L (ref 3.5–5.2)
Sodium: 142 mmol/L (ref 134–144)
Total Protein: 6.5 g/dL (ref 6.0–8.5)

## 2016-08-28 DIAGNOSIS — R69 Illness, unspecified: Secondary | ICD-10-CM | POA: Diagnosis not present

## 2016-08-29 ENCOUNTER — Ambulatory Visit (INDEPENDENT_AMBULATORY_CARE_PROVIDER_SITE_OTHER): Payer: Medicare HMO | Admitting: Family Medicine

## 2016-08-29 ENCOUNTER — Encounter: Payer: Self-pay | Admitting: Family Medicine

## 2016-08-29 VITALS — BP 114/64 | HR 77 | Temp 98.7°F | Ht 69.0 in | Wt 195.0 lb

## 2016-08-29 DIAGNOSIS — I2782 Chronic pulmonary embolism: Secondary | ICD-10-CM

## 2016-08-29 DIAGNOSIS — F329 Major depressive disorder, single episode, unspecified: Secondary | ICD-10-CM | POA: Diagnosis not present

## 2016-08-29 DIAGNOSIS — D638 Anemia in other chronic diseases classified elsewhere: Secondary | ICD-10-CM | POA: Diagnosis not present

## 2016-08-29 DIAGNOSIS — I5042 Chronic combined systolic (congestive) and diastolic (congestive) heart failure: Secondary | ICD-10-CM

## 2016-08-29 DIAGNOSIS — F32A Depression, unspecified: Secondary | ICD-10-CM

## 2016-08-29 DIAGNOSIS — E119 Type 2 diabetes mellitus without complications: Secondary | ICD-10-CM

## 2016-08-29 DIAGNOSIS — R69 Illness, unspecified: Secondary | ICD-10-CM | POA: Diagnosis not present

## 2016-08-29 DIAGNOSIS — J431 Panlobular emphysema: Secondary | ICD-10-CM

## 2016-08-29 NOTE — Progress Notes (Addendum)
Subjective:  Patient ID: Derek Shepherd, male    DOB: 24-Jul-1955  Age: 61 y.o. MRN: [id number]  CC: Follow-up (pt here today for routine follow up of his chronic medical conditions)   HPI Derek Shepherd presents for Recheck of his profound anemia. He has been followed at [facility] for his anemia however recently he was given a transfusion here through [facility] due to severe dyspnea and prostration in a situation where [place] was afraid of giving him an iron overload. This in spite of getting a low iron level. He says he's felt much better since the transfusion although he still has his baseline dyspnea from COPD. His heart failure has been quiesced and in that the shortness of breath has been stable. He is been able to lay down to sleep. He's not been swollen. His energy although poor is at baseline.  Follow-up of diabetes. Patient does check blood sugar at home Patient denies symptoms such as polyuria, polydipsia, excessive hunger, nausea. He does continue to have numbness in the feet. No significant hypoglycemic spells noted. Medications as noted below. Taking them regularly without complication/adverse reaction being reported today. He has an appointment with his eye doctor at [facility] for annual checkup on July 11.  Depression screen [facility] 2/9 08/29/2016 08/23/2016 07/19/2016  Decreased Interest _0 Down, Depressed, Hopeless _1 PHQ - 2 Score _2 Altered sleeping _3 Tired, decreased energy _4 Change in appetite _5 Feeling bad or failure about yourself  0 0 0  Trouble concentrating _6 Moving slowly or fidgety/restless _7 Suicidal thoughts 0 0 0  PHQ-9 Score _8 Difficult doing work/chores Very difficult Very difficult Somewhat difficult  Some recent data might be hidden    History Derek Shepherd has a past medical history of Adenomatous colon polyp; Anemia; Anxiety; Arthritis; Asthma; Bowel obstruction (HCC); CHF (congestive heart failure) ([place]);  COPD (chronic obstructive pulmonary disease) ([place]); Depression; Diverticulosis; DVT (deep venous thrombosis) ([place]); Gallstones; GERD (gastroesophageal reflux disease); 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   He has a past surgical history that includes Cholecystectomy; Lung surgery (Left); Exploratory laparotomy; Hernia repair; Colonoscopy w/ biopsies; and Bone marrow transplant (2011).   His family history includes Clotting disorder in his mother; Diabetes in his brother, father, maternal aunt, and sister; Heart attack in his father and mother; Prostate cancer in his brother.He reports that he quit smoking about 23 years ago. His smoking use included Cigarettes. He has never used smokeless tobacco. He reports that he drinks alcohol. He reports that he does not use drugs.    ROS Review of Systems  Constitutional: Positive for fatigue. Negative for chills, diaphoresis, fever and unexpected weight change.  HENT: Negative for congestion, hearing loss, rhinorrhea and sore throat.   Eyes: Negative for visual disturbance.  Respiratory: Positive for shortness of breath. Negative for cough.   Cardiovascular: Negative for chest pain.  Gastrointestinal: Negative for abdominal pain, constipation and diarrhea.  Genitourinary: Negative for dysuria and flank pain.  Musculoskeletal: Negative for arthralgias and joint swelling.  Skin: Negative for rash.  Neurological: Negative for dizziness and headaches.  Psychiatric/Behavioral: Negative for dysphoric mood and sleep disturbance.    Objective:  BP 114/64   Pulse 77   Temp 98.7  F (37.1 C) (Oral)   Ht _0  (1.753 m)   Wt 195 lb (88.5 kg)   SpO2 97%   BMI 28.80 kg/m   BP Readings from Last 3 Encounters:  08/29/16 114/64  08/23/16 134/83  07/28/16 137/79     Wt Readings from Last 3 Encounters:  08/29/16 195 lb (88.5 kg)  08/23/16 194 lb (88 kg)  07/28/16 197 lb (89.4 kg)     Physical Exam    Assessment & Plan:   Derek Shepherd was seen today for follow-up.  Diagnoses and all orders for this visit:  Panlobular emphysema ([place]) -     CMP14+EGFR -     Fe+TIBC+Fer  Other chronic pulmonary embolism without acute cor pulmonale (HCC) -     CMP14+EGFR  Chronic combined systolic and diastolic CHF (congestive heart failure) (HCC) -     CMP14+EGFR  Controlled type 2 diabetes mellitus without complication, without long-term current use of insulin (HCC) -     Microalbumin / creatinine urine ratio -     Cancel: Bayer DCA Hb A1c Waived -     Lipid panel -     CMP14+EGFR -     CBC with Differential/Platelet  Anemia of chronic disease -     CMP14+EGFR -     CBC with Differential/Platelet -     Fe+TIBC+Fer  Depression, unspecified depression type       I have discontinued Derek Shepherd's levofloxacin. I am also having him maintain his vitamin B-12, fluticasone, budesonide-formoterol, lactulose, multivitamin with minerals, Polyethyl Glycol-Propyl Glycol, acidophilus, senna, torsemide, nefazodone, citalopram, mycophenolate, potassium chloride, diphenoxylate-atropine, folic acid, umeclidinium bromide, Sirolimus, metFORMIN, glucose blood, methylPREDNISolone, montelukast, potassium chloride, rOPINIRole, SYMBICORT, levalbuterol, HYDROmorphone, and doxycycline.  Allergies as of 08/29/2016      Reactions   Nsaids Shortness Of Breath      Medication List       Accurate as of 08/29/16  6:08 PM. Always use your most recent med list.          acidophilus Caps capsule Take 1 capsule by mouth daily.   budesonide-formoterol 160-4.5 MCG/ACT inhaler Commonly known as:  SYMBICORT Inhale 2 puffs into the lungs 2 (two) times daily.   SYMBICORT 160-4.5 MCG/ACT inhaler Generic drug:  budesonide-formoterol INHALE TWO PUFFS BY MOUTH ONCE DAILY IN  THE MORNING   citalopram 40 MG tablet Commonly known as:  CELEXA Take 1 tablet (40 mg total) by mouth daily. Reduce to 1/2 daily while taking fluconazole (2 weeks)   diphenoxylate-atropine 2.5-0.025 MG tablet Commonly known as:  LOMOTIL Take 2 tablets by mouth 4 (four) times daily as needed for diarrhea or loose stools.   doxycycline 100 MG tablet Commonly known as:  VIBRA-TABS Take 1 tablet (100 mg total) by mouth 2 (two) times daily.   fluticasone 50 MCG/ACT nasal spray Commonly known as:  FLONASE Place 1 spray into both nostrils daily.   folic acid 1 MG tablet Commonly known as:  FOLVITE TAKE ONE TABLET BY MOUTH ONCE DAILY   glucose blood test strip Use as instructed   HYDROmorphone 4 MG tablet Commonly known as:  DILAUDID Take 4 mg by mouth every 4 (four) hours.   lactulose 10 GM/15ML solution Commonly known as:  CHRONULAC Take 20 g by mouth 2 (two) times daily as needed for mild constipation.   levalbuterol 1.25 MG/3ML nebulizer solution Commonly known as:  XOPENEX USE ONE VIAL IN NEBULIZER 4 TIMES DAILY   metFORMIN 500 MG tablet Commonly known as:  GLUCOPHAGE TAKE  ONE TABLET BY MOUTH TWICE DAILY WITH A MEAL   methylPREDNISolone 8 MG tablet Commonly known as:  MEDROL Take 1 tablet (8 mg total) by mouth daily.   montelukast 10 MG tablet Commonly known as:  SINGULAIR TAKE ONE TABLET BY MOUTH ONCE DAILY   multivitamin with minerals Tabs tablet Take 1 tablet by mouth daily.   mycophenolate 500 MG tablet Commonly known as:  CELLCEPT Take 500 mg by mouth.   nefazodone 50 MG tablet Commonly known as:  SERZONE Take 1 tablet (50 mg total) by mouth at bedtime.   potassium chloride 10 MEQ CR capsule Commonly known as:  MICRO-K Take 2 tablets twice daily   potassium chloride 10 MEQ CR capsule Commonly known as:  MICRO-K TAKE THREE CAPSULES BY MOUTH TWICE DAILY   rOPINIRole 3 MG tablet Commonly known as:  REQUIP TAKE 1 TABLET BY MOUTH ONCE DAILY AT  BEDTIME   senna 8.6 MG tablet Commonly known as:  SENOKOT Take 2 tablets by mouth daily as needed for constipation.   Sirolimus 0.5 MG tablet Commonly known as:  RAPAMUNE TAKE ONE TABLET BY MOUTH ONCE DAILY   SYSTANE 0.4-0.3 % Soln Generic drug:  Polyethyl Glycol-Propyl Glycol Place 1 drop into both eyes as needed (for dry eyes).   torsemide 100 MG tablet Commonly known as:  DEMADEX Take 1 tablet (100 mg total) by mouth daily.   umeclidinium bromide 62.5 MCG/INH Aepb Commonly known as:  INCRUSE ELLIPTA Inhale 1 puff into the lungs daily.   vitamin B-12 1000 MCG tablet Commonly known as:  CYANOCOBALAMIN Take 1,000 mcg by mouth daily.        Follow-up: Return in about 3 months (around 11/29/2016).  Claretta Fraise, M.D.

## 2016-08-30 ENCOUNTER — Other Ambulatory Visit: Payer: Self-pay | Admitting: Family Medicine

## 2016-08-30 LAB — CBC WITH DIFFERENTIAL/PLATELET
BASOS: 1 %
Basophils Absolute: 0.1 10*3/uL (ref 0.0–0.2)
EOS (ABSOLUTE): 0.1 10*3/uL (ref 0.0–0.4)
EOS: 1 %
HEMATOCRIT: 32.2 % — AB (ref 37.5–51.0)
HEMOGLOBIN: 10.6 g/dL — AB (ref 13.0–17.7)
Immature Grans (Abs): 0.1 10*3/uL (ref 0.0–0.1)
Immature Granulocytes: 1 %
LYMPHS ABS: 1.4 10*3/uL (ref 0.7–3.1)
Lymphs: 12 %
MCH: 32.5 pg (ref 26.6–33.0)
MCHC: 32.9 g/dL (ref 31.5–35.7)
MCV: 99 fL — ABNORMAL HIGH (ref 79–97)
MONOCYTES: 6 %
Monocytes Absolute: 0.7 10*3/uL (ref 0.1–0.9)
NEUTROS ABS: 8.8 10*3/uL — AB (ref 1.4–7.0)
Neutrophils: 79 %
Platelets: 161 10*3/uL (ref 150–379)
RBC: 3.26 x10E6/uL — ABNORMAL LOW (ref 4.14–5.80)
RDW: 15.9 % — AB (ref 12.3–15.4)
WBC: 11.1 10*3/uL — ABNORMAL HIGH (ref 3.4–10.8)

## 2016-08-30 LAB — LIPID PANEL
CHOLESTEROL TOTAL: 253 mg/dL — AB (ref 100–199)
Chol/HDL Ratio: 3.2 ratio (ref 0.0–5.0)
HDL: 79 mg/dL (ref >39)
LDL Calculated: 138 mg/dL — ABNORMAL HIGH (ref 0–99)
Triglycerides: 180 mg/dL — ABNORMAL HIGH (ref 0–149)
VLDL Cholesterol Cal: 36 mg/dL (ref 5–40)

## 2016-08-30 LAB — CMP14+EGFR
ALBUMIN: 4.8 g/dL (ref 3.5–5.5)
ALT: 23 IU/L (ref 0–44)
AST: 21 IU/L (ref 0–40)
Albumin/Globulin Ratio: 2.5 — ABNORMAL HIGH (ref 1.2–2.2)
Alkaline Phosphatase: 58 IU/L (ref 39–117)
BUN / CREAT RATIO: 23 — AB (ref 9–20)
BUN: 18 mg/dL (ref 6–24)
Bilirubin Total: 0.3 mg/dL (ref 0.0–1.2)
CO2: 37 mmol/L — AB (ref 20–29)
CREATININE: 0.8 mg/dL (ref 0.76–1.27)
Calcium: 9.9 mg/dL (ref 8.7–10.2)
Chloride: 91 mmol/L — ABNORMAL LOW (ref 96–106)
GFR calc non Af Amer: 98 mL/min/{1.73_m2} (ref >59)
GFR, EST AFRICAN AMERICAN: 114 mL/min/{1.73_m2} (ref >59)
GLOBULIN, TOTAL: 1.9 g/dL (ref 1.5–4.5)
GLUCOSE: 106 mg/dL — AB (ref 65–99)
Potassium: 3.8 mmol/L (ref 3.5–5.2)
SODIUM: 145 mmol/L — AB (ref 134–144)
TOTAL PROTEIN: 6.7 g/dL (ref 6.0–8.5)

## 2016-08-30 LAB — FE+TIBC+FER
FERRITIN: 842 ng/mL — AB (ref 30–400)
IRON: 158 ug/dL (ref 38–169)
Iron Saturation: 64 % — ABNORMAL HIGH (ref 15–55)
Total Iron Binding Capacity: 246 ug/dL — ABNORMAL LOW (ref 250–450)
UIBC: 88 ug/dL — ABNORMAL LOW (ref 111–343)

## 2016-08-30 LAB — MICROALBUMIN / CREATININE URINE RATIO
Creatinine, Urine: 21.3 mg/dL
MICROALBUM., U, RANDOM: 47 ug/mL
Microalb/Creat Ratio: 220.7 mg/g creat — ABNORMAL HIGH (ref 0.0–30.0)

## 2016-08-30 MED ORDER — POTASSIUM CHLORIDE ER 10 MEQ PO CPCR
ORAL_CAPSULE | ORAL | 5 refills | Status: DC
Start: 2016-08-30 — End: 2017-03-28

## 2016-08-30 MED ORDER — ATORVASTATIN CALCIUM 40 MG PO TABS: 40.0000 mg | ORAL_TABLET | Freq: Every day | ORAL | 3 refills | Status: DC

## 2016-09-03 DIAGNOSIS — R0902 Hypoxemia: Secondary | ICD-10-CM | POA: Diagnosis not present

## 2016-09-06 DIAGNOSIS — T865 Complications of stem cell transplant: Secondary | ICD-10-CM | POA: Diagnosis not present

## 2016-09-06 DIAGNOSIS — G4700 Insomnia, unspecified: Secondary | ICD-10-CM | POA: Diagnosis not present

## 2016-09-06 DIAGNOSIS — D89811 Chronic graft-versus-host disease: Secondary | ICD-10-CM | POA: Diagnosis not present

## 2016-09-06 DIAGNOSIS — Z9484 Stem cells transplant status: Secondary | ICD-10-CM | POA: Diagnosis not present

## 2016-09-06 DIAGNOSIS — Z792 Long term (current) use of antibiotics: Secondary | ICD-10-CM | POA: Diagnosis not present

## 2016-09-06 DIAGNOSIS — R69 Illness, unspecified: Secondary | ICD-10-CM | POA: Diagnosis not present

## 2016-09-06 DIAGNOSIS — Z48290 Encounter for aftercare following bone marrow transplant: Secondary | ICD-10-CM | POA: Diagnosis not present

## 2016-09-06 DIAGNOSIS — J9611 Chronic respiratory failure with hypoxia: Secondary | ICD-10-CM | POA: Diagnosis not present

## 2016-09-06 DIAGNOSIS — D89813 Graft-versus-host disease, unspecified: Secondary | ICD-10-CM | POA: Diagnosis not present

## 2016-09-06 DIAGNOSIS — X58XXXS Exposure to other specified factors, sequela: Secondary | ICD-10-CM | POA: Diagnosis not present

## 2016-09-06 DIAGNOSIS — J42 Unspecified chronic bronchitis: Secondary | ICD-10-CM | POA: Diagnosis not present

## 2016-09-06 DIAGNOSIS — E876 Hypokalemia: Secondary | ICD-10-CM | POA: Diagnosis not present

## 2016-09-06 DIAGNOSIS — C9101 Acute lymphoblastic leukemia, in remission: Secondary | ICD-10-CM | POA: Diagnosis not present

## 2016-09-09 DIAGNOSIS — R000 Tachycardia, unspecified: Secondary | ICD-10-CM | POA: Diagnosis not present

## 2016-09-09 DIAGNOSIS — Z79899 Other long term (current) drug therapy: Secondary | ICD-10-CM | POA: Diagnosis not present

## 2016-09-09 DIAGNOSIS — K5903 Drug induced constipation: Secondary | ICD-10-CM | POA: Diagnosis not present

## 2016-09-09 DIAGNOSIS — G894 Chronic pain syndrome: Secondary | ICD-10-CM | POA: Diagnosis not present

## 2016-09-09 DIAGNOSIS — G8929 Other chronic pain: Secondary | ICD-10-CM | POA: Diagnosis not present

## 2016-09-09 DIAGNOSIS — Z5181 Encounter for therapeutic drug level monitoring: Secondary | ICD-10-CM | POA: Diagnosis not present

## 2016-09-09 DIAGNOSIS — M79605 Pain in left leg: Secondary | ICD-10-CM | POA: Diagnosis not present

## 2016-09-14 DIAGNOSIS — D89813 Graft-versus-host disease, unspecified: Secondary | ICD-10-CM | POA: Diagnosis not present

## 2016-09-14 DIAGNOSIS — Z9842 Cataract extraction status, left eye: Secondary | ICD-10-CM | POA: Diagnosis not present

## 2016-09-14 DIAGNOSIS — Z9841 Cataract extraction status, right eye: Secondary | ICD-10-CM | POA: Diagnosis not present

## 2016-09-14 DIAGNOSIS — H04123 Dry eye syndrome of bilateral lacrimal glands: Secondary | ICD-10-CM | POA: Diagnosis not present

## 2016-09-14 DIAGNOSIS — Z961 Presence of intraocular lens: Secondary | ICD-10-CM | POA: Diagnosis not present

## 2016-09-15 DIAGNOSIS — R0902 Hypoxemia: Secondary | ICD-10-CM | POA: Diagnosis not present

## 2016-09-17 DIAGNOSIS — J479 Bronchiectasis, uncomplicated: Secondary | ICD-10-CM | POA: Diagnosis not present

## 2016-09-19 ENCOUNTER — Other Ambulatory Visit: Payer: Self-pay | Admitting: Family Medicine

## 2016-09-20 DIAGNOSIS — Z7951 Long term (current) use of inhaled steroids: Secondary | ICD-10-CM | POA: Diagnosis not present

## 2016-09-20 DIAGNOSIS — K210 Gastro-esophageal reflux disease with esophagitis: Secondary | ICD-10-CM | POA: Diagnosis not present

## 2016-09-20 DIAGNOSIS — D509 Iron deficiency anemia, unspecified: Secondary | ICD-10-CM | POA: Diagnosis not present

## 2016-09-20 DIAGNOSIS — I509 Heart failure, unspecified: Secondary | ICD-10-CM | POA: Diagnosis not present

## 2016-09-20 DIAGNOSIS — E119 Type 2 diabetes mellitus without complications: Secondary | ICD-10-CM | POA: Diagnosis not present

## 2016-09-20 DIAGNOSIS — Z87891 Personal history of nicotine dependence: Secondary | ICD-10-CM | POA: Diagnosis not present

## 2016-09-20 DIAGNOSIS — Z7984 Long term (current) use of oral hypoglycemic drugs: Secondary | ICD-10-CM | POA: Diagnosis not present

## 2016-09-20 DIAGNOSIS — Z79899 Other long term (current) drug therapy: Secondary | ICD-10-CM | POA: Diagnosis not present

## 2016-09-20 DIAGNOSIS — J449 Chronic obstructive pulmonary disease, unspecified: Secondary | ICD-10-CM | POA: Diagnosis not present

## 2016-09-20 DIAGNOSIS — Z86711 Personal history of pulmonary embolism: Secondary | ICD-10-CM | POA: Diagnosis not present

## 2016-09-25 ENCOUNTER — Other Ambulatory Visit: Payer: Self-pay | Admitting: Family

## 2016-09-25 DIAGNOSIS — E1165 Type 2 diabetes mellitus with hyperglycemia: Secondary | ICD-10-CM

## 2016-09-27 DIAGNOSIS — Z86711 Personal history of pulmonary embolism: Secondary | ICD-10-CM | POA: Diagnosis not present

## 2016-09-27 DIAGNOSIS — I5032 Chronic diastolic (congestive) heart failure: Secondary | ICD-10-CM | POA: Diagnosis not present

## 2016-09-27 DIAGNOSIS — B449 Aspergillosis, unspecified: Secondary | ICD-10-CM | POA: Diagnosis not present

## 2016-09-27 DIAGNOSIS — R000 Tachycardia, unspecified: Secondary | ICD-10-CM | POA: Diagnosis not present

## 2016-09-27 DIAGNOSIS — I110 Hypertensive heart disease with heart failure: Secondary | ICD-10-CM | POA: Diagnosis not present

## 2016-09-27 DIAGNOSIS — Z87891 Personal history of nicotine dependence: Secondary | ICD-10-CM | POA: Diagnosis not present

## 2016-09-27 DIAGNOSIS — R69 Illness, unspecified: Secondary | ICD-10-CM | POA: Diagnosis not present

## 2016-09-27 DIAGNOSIS — J449 Chronic obstructive pulmonary disease, unspecified: Secondary | ICD-10-CM | POA: Diagnosis not present

## 2016-09-27 DIAGNOSIS — E119 Type 2 diabetes mellitus without complications: Secondary | ICD-10-CM | POA: Diagnosis not present

## 2016-09-27 DIAGNOSIS — D89813 Graft-versus-host disease, unspecified: Secondary | ICD-10-CM | POA: Diagnosis not present

## 2016-09-27 DIAGNOSIS — Z9484 Stem cells transplant status: Secondary | ICD-10-CM | POA: Diagnosis not present

## 2016-10-03 DIAGNOSIS — R0902 Hypoxemia: Secondary | ICD-10-CM | POA: Diagnosis not present

## 2016-10-04 DIAGNOSIS — I110 Hypertensive heart disease with heart failure: Secondary | ICD-10-CM | POA: Diagnosis not present

## 2016-10-04 DIAGNOSIS — G4700 Insomnia, unspecified: Secondary | ICD-10-CM | POA: Diagnosis not present

## 2016-10-04 DIAGNOSIS — J42 Unspecified chronic bronchitis: Secondary | ICD-10-CM | POA: Diagnosis not present

## 2016-10-04 DIAGNOSIS — Y848 Other medical procedures as the cause of abnormal reaction of the patient, or of later complication, without mention of misadventure at the time of the procedure: Secondary | ICD-10-CM | POA: Diagnosis not present

## 2016-10-04 DIAGNOSIS — Z48290 Encounter for aftercare following bone marrow transplant: Secondary | ICD-10-CM | POA: Diagnosis not present

## 2016-10-04 DIAGNOSIS — T865 Complications of stem cell transplant: Secondary | ICD-10-CM | POA: Diagnosis not present

## 2016-10-04 DIAGNOSIS — R0689 Other abnormalities of breathing: Secondary | ICD-10-CM | POA: Diagnosis not present

## 2016-10-04 DIAGNOSIS — Z79899 Other long term (current) drug therapy: Secondary | ICD-10-CM | POA: Diagnosis not present

## 2016-10-04 DIAGNOSIS — D89811 Chronic graft-versus-host disease: Secondary | ICD-10-CM | POA: Diagnosis not present

## 2016-10-04 DIAGNOSIS — C9101 Acute lymphoblastic leukemia, in remission: Secondary | ICD-10-CM | POA: Diagnosis not present

## 2016-10-04 DIAGNOSIS — R69 Illness, unspecified: Secondary | ICD-10-CM | POA: Diagnosis not present

## 2016-10-08 ENCOUNTER — Other Ambulatory Visit: Payer: Self-pay | Admitting: Family Medicine

## 2016-10-13 ENCOUNTER — Other Ambulatory Visit: Payer: Self-pay | Admitting: Family Medicine

## 2016-10-14 NOTE — Telephone Encounter (Signed)
Last seen 08/29/16  Dr Livia Snellen

## 2016-10-14 NOTE — Telephone Encounter (Signed)
Forwarding to PCP/stacks. Not on oncology/hematology's list from recent visit.

## 2016-10-18 DIAGNOSIS — J479 Bronchiectasis, uncomplicated: Secondary | ICD-10-CM | POA: Diagnosis not present

## 2016-10-27 IMAGING — CR DG ANKLE COMPLETE 3+V*L*
3 series · 3 of 3 positions shown · non-contrast
Comparison: None.

CLINICAL DATA: Fall.

EXAM:
LEFT ANKLE COMPLETE - 3+ VIEW

[view not recorded (1 of 3)]
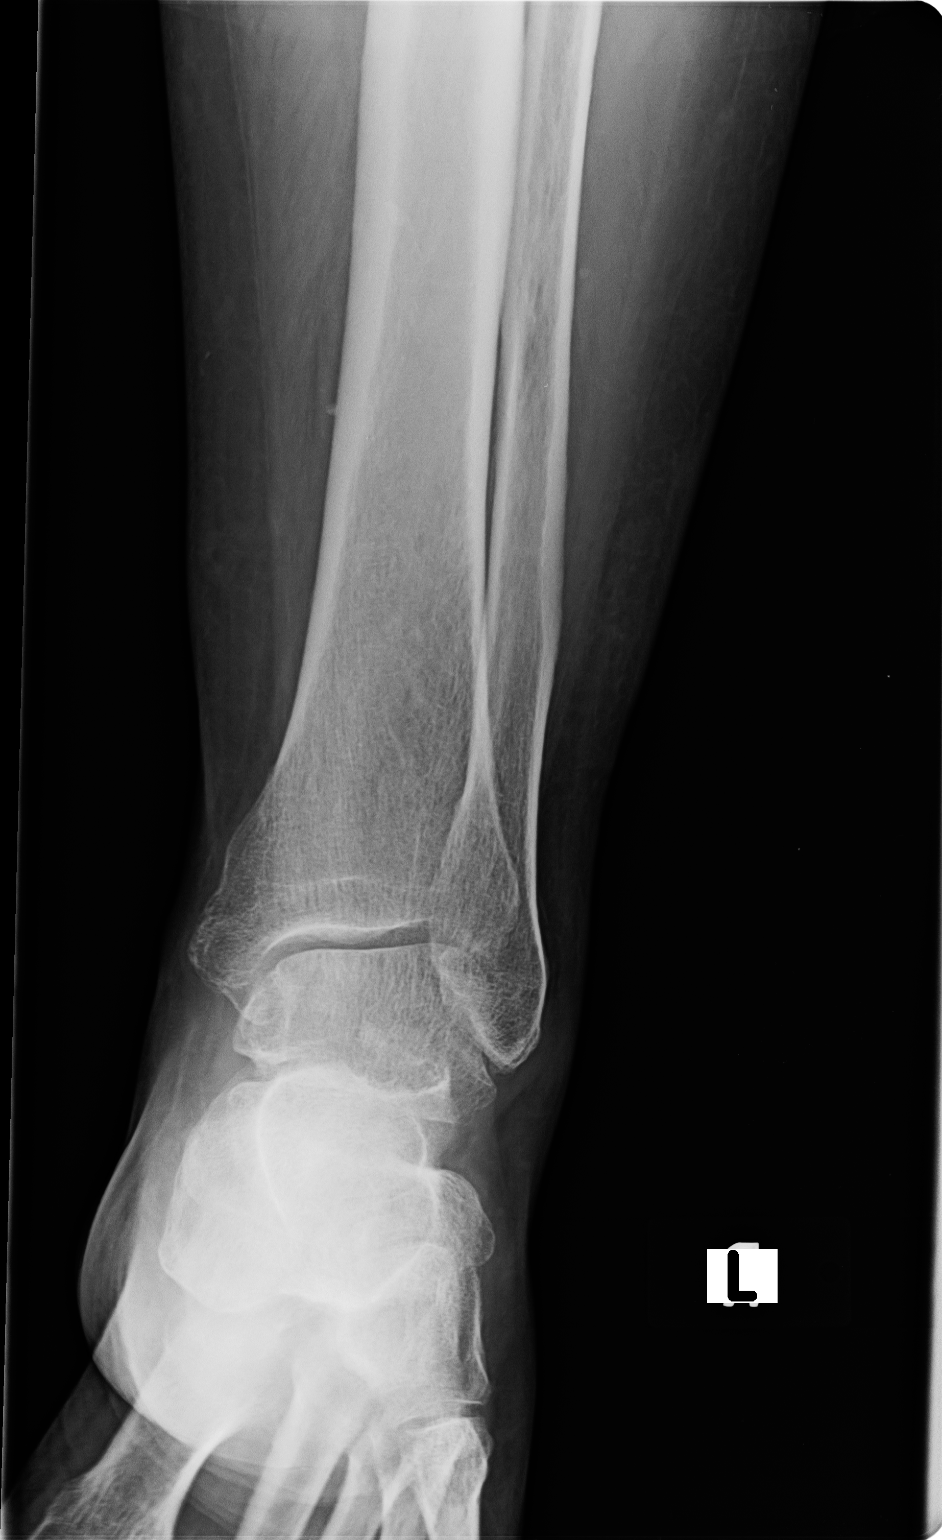

[view not recorded (2 of 3)]
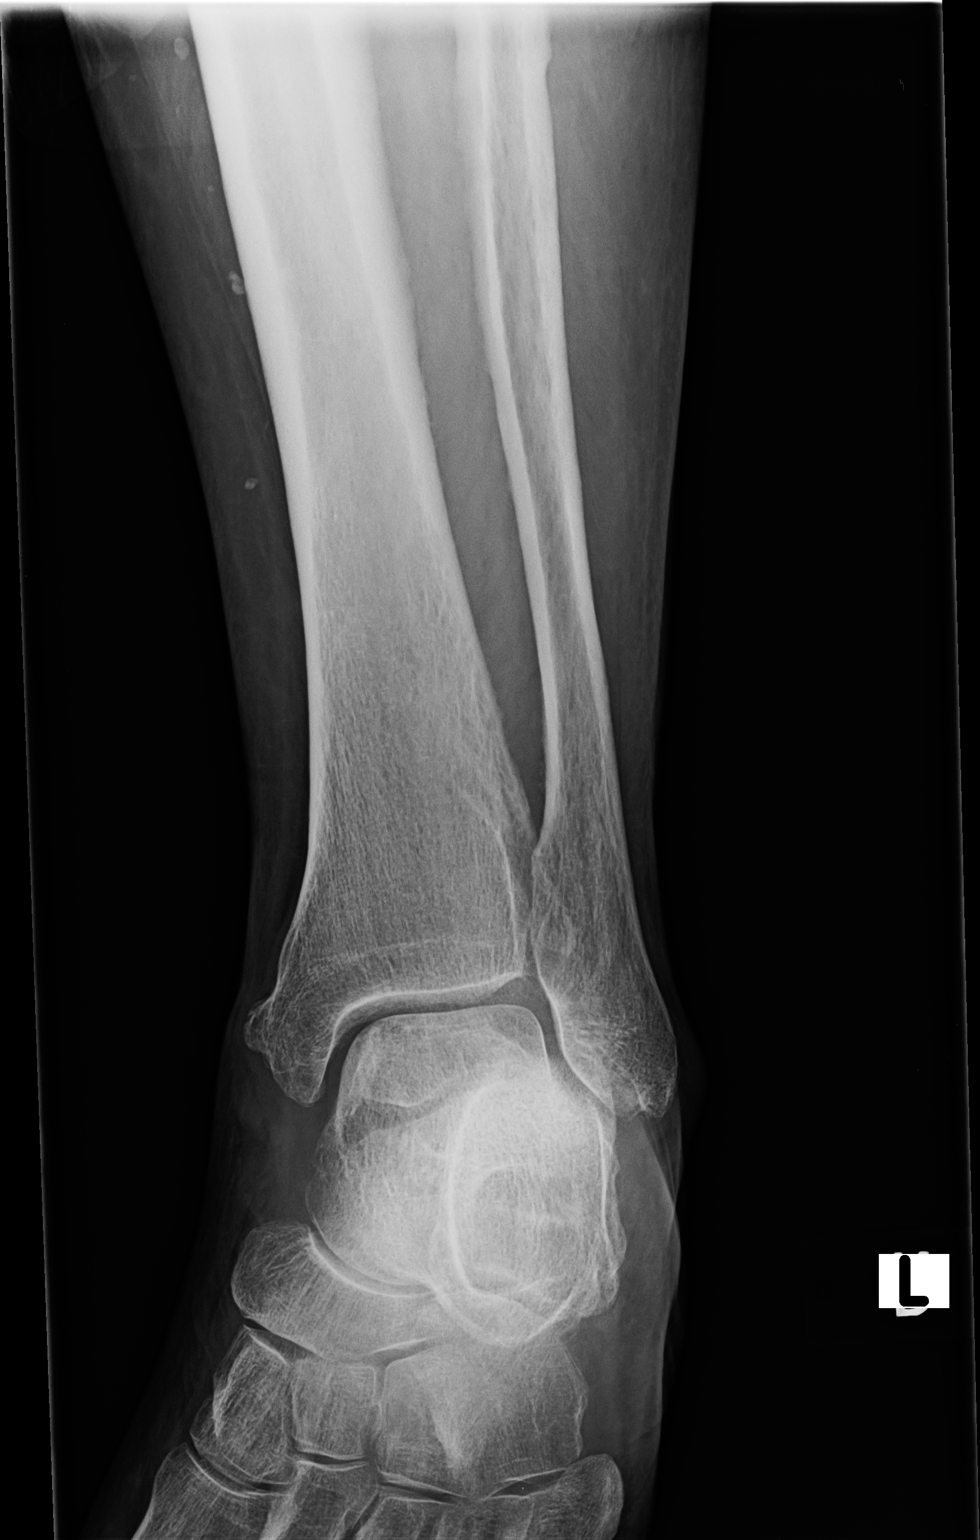

[view not recorded (3 of 3)]
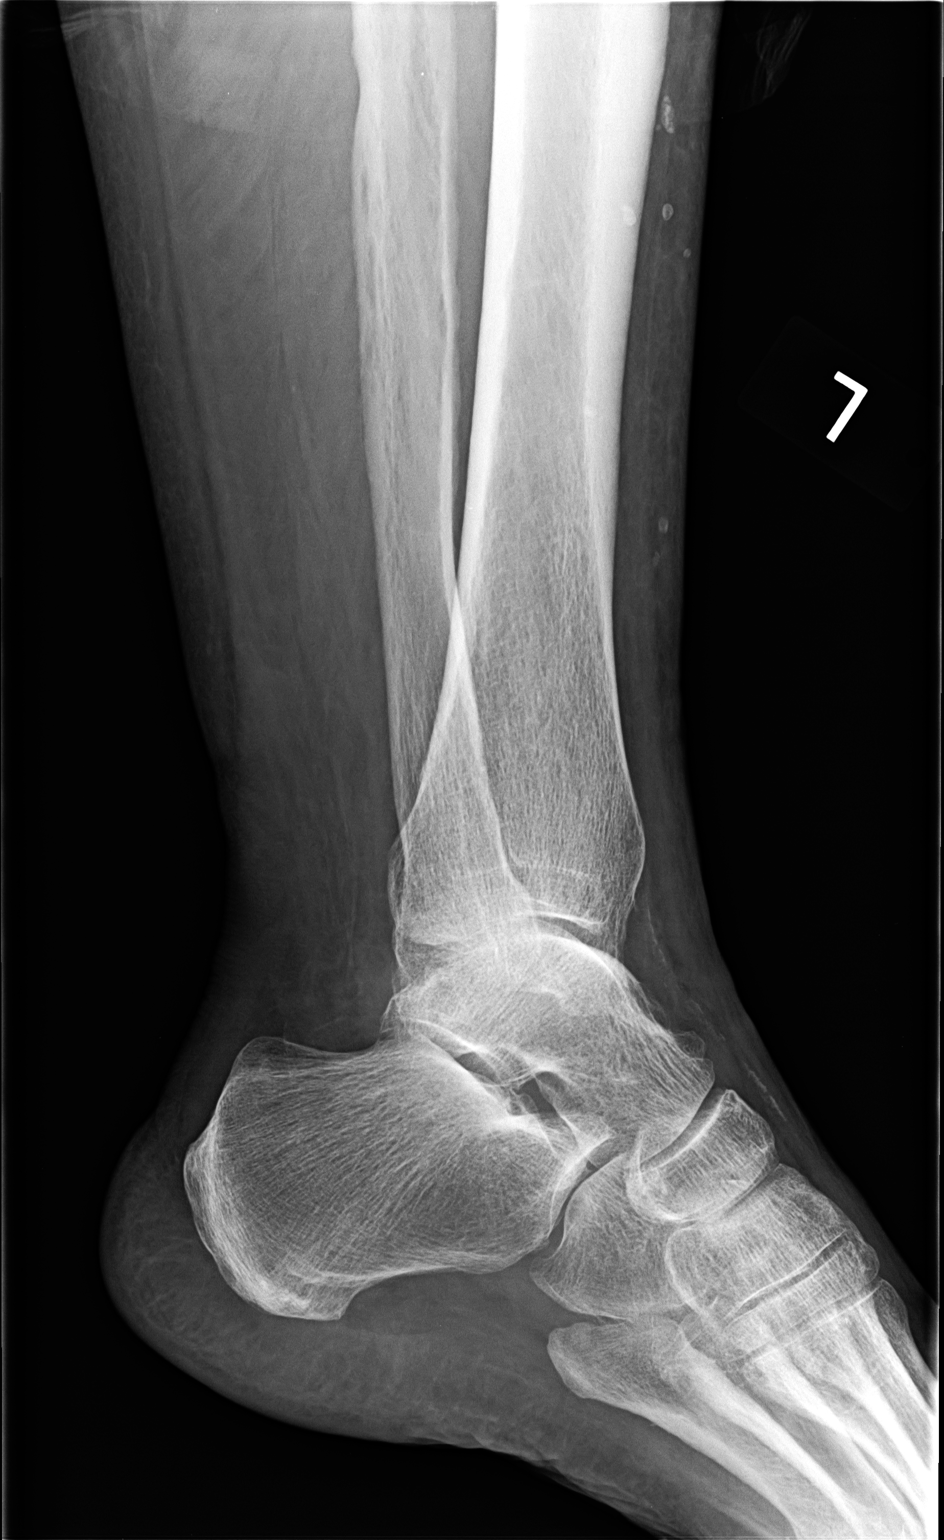

[3 of 3 positions shown; findings below may reference images not displayed]

FINDINGS: No acute bony or joint abnormality identified. No evidence of
fracture or dislocation. Peripheral vascular calcification noted .
IMPRESSION: 1.  Diffuse osteopenia and degenerative change.

2.  Peripheral vascular disease.

## 2016-11-01 DIAGNOSIS — G8929 Other chronic pain: Secondary | ICD-10-CM | POA: Diagnosis not present

## 2016-11-01 DIAGNOSIS — I110 Hypertensive heart disease with heart failure: Secondary | ICD-10-CM | POA: Diagnosis not present

## 2016-11-01 DIAGNOSIS — G4700 Insomnia, unspecified: Secondary | ICD-10-CM | POA: Diagnosis not present

## 2016-11-01 DIAGNOSIS — R69 Illness, unspecified: Secondary | ICD-10-CM | POA: Diagnosis not present

## 2016-11-01 DIAGNOSIS — D89811 Chronic graft-versus-host disease: Secondary | ICD-10-CM | POA: Diagnosis not present

## 2016-11-01 DIAGNOSIS — D801 Nonfamilial hypogammaglobulinemia: Secondary | ICD-10-CM | POA: Diagnosis not present

## 2016-11-01 DIAGNOSIS — I5030 Unspecified diastolic (congestive) heart failure: Secondary | ICD-10-CM | POA: Diagnosis not present

## 2016-11-01 DIAGNOSIS — Z9484 Stem cells transplant status: Secondary | ICD-10-CM | POA: Diagnosis not present

## 2016-11-01 DIAGNOSIS — Z48290 Encounter for aftercare following bone marrow transplant: Secondary | ICD-10-CM | POA: Diagnosis not present

## 2016-11-01 DIAGNOSIS — C9101 Acute lymphoblastic leukemia, in remission: Secondary | ICD-10-CM | POA: Diagnosis not present

## 2016-11-01 DIAGNOSIS — J449 Chronic obstructive pulmonary disease, unspecified: Secondary | ICD-10-CM | POA: Diagnosis not present

## 2016-11-03 DIAGNOSIS — R0902 Hypoxemia: Secondary | ICD-10-CM | POA: Diagnosis not present

## 2016-11-03 DIAGNOSIS — R69 Illness, unspecified: Secondary | ICD-10-CM | POA: Diagnosis not present

## 2016-11-07 ENCOUNTER — Other Ambulatory Visit: Payer: Self-pay | Admitting: Family Medicine

## 2016-11-09 ENCOUNTER — Ambulatory Visit (INDEPENDENT_AMBULATORY_CARE_PROVIDER_SITE_OTHER): Payer: Medicare HMO | Admitting: Family Medicine

## 2016-11-09 ENCOUNTER — Encounter: Payer: Self-pay | Admitting: Family Medicine

## 2016-11-09 VITALS — BP 118/66 | HR 95 | Temp 98.9°F | Ht 69.0 in | Wt 201.0 lb

## 2016-11-09 DIAGNOSIS — I951 Orthostatic hypotension: Secondary | ICD-10-CM

## 2016-11-09 DIAGNOSIS — R0600 Dyspnea, unspecified: Secondary | ICD-10-CM

## 2016-11-09 NOTE — Progress Notes (Signed)
Subjective:  Patient ID: Derek Shepherd, male    DOB: Oct 23, 1955  Age: 61 y.o. MRN: [id number]  CC: Dizziness (pt here today c/o getting dizzy when he stands up quick or if he bends over to pick something up)   HPI Derek Shepherd presents for concern about feeling lightheadf breath when he bends over to pick something Ongoing for several weeks. He has not passed out from it. It lasts only a few moments.He is also had elevated blood pressures at home.No log has been returned. But the diastolic pressures have been noted to be normal while systolic runs 465-681. O2 sats stay good all the time in spite of dyspnea.  Depression screen [facility] 2/9 11/09/2016 08/29/2016 08/23/2016  Decreased Interest 1 3 3   Down, Depressed, Hopeless 1 3 3   PHQ - 2 Score 2 6 6   Altered sleeping 1 3 3   Tired, decreased energy 1 3 3   Change in appetite 0 1 1  Feeling bad or failure about yourself  0 0 0  Trouble concentrating 2 2 2   Moving slowly or fidgety/restless 2 3 3   Suicidal thoughts 0 0 0  PHQ-9 Score 8 18 18   Difficult doing work/chores - Very difficult Very difficult  Some recent data might be hidden    History Derek Shepherd has a past medical history of Adenomatous colon polyp; Anemia; Anxiety; Arthritis; Asthma; Bowel obstruction (HCC); CHF (congestive heart failure) ([place]); COPD (chronic obstructive pulmonary disease) ([place]); Depression; Diverticulosis; DVT (deep venous thrombosis) ([place]); Gallstones; GERD (gastroesophageal reflux disease); 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   He has a past surgical history that includes Cholecystectomy; Lung surgery (Left); Exploratory laparotomy; Hernia repair; Colonoscopy w/ biopsies; and Bone marrow transplant (2011).   His family history includes Clotting disorder in his mother;  Diabetes in his brother, father, maternal aunt, and sister; Heart attack in his father and mother; Prostate cancer in his brother.He reports that he quit smoking about 23 years ago. His smoking use included Cigarettes. He has never used smokeless tobacco. He reports that he drinks alcohol. He reports that he does not use drugs.    ROS Review of Systems  Constitutional: Positive for fatigue. Negative for chills, diaphoresis and fever.  HENT: Negative for rhinorrhea and sore throat.   Respiratory: Positive for shortness of breath. Negative for cough.   Cardiovascular: Negative for chest pain, palpitations and leg swelling.  Gastrointestinal: Negative for abdominal pain.  Musculoskeletal: Negative for arthralgias and myalgias.  Skin: Negative for rash.  Neurological: Positive for dizziness, weakness and light-headedness. Negative for headaches.    Objective:  BP 118/66   Pulse 95   Temp 98.9 F (37.2 C) (Oral)   Ht 5' 9"  (1.753 m)   Wt 201 lb (91.2 kg)   SpO2 93% Comment: on 4L of oxygen  BMI 29.68 kg/m   BP Readings from Last 3 Encounters:  11/09/16 118/66  08/29/16 114/64  08/23/16 134/83    Wt Readings from Last 3 Encounters:  11/09/16 201 lb (91.2 kg)  08/29/16 195 lb (88.5 kg)  08/23/16 194 lb (88 kg)     Physical Exam  Constitutional: He is oriented to person, place, and time. He appears well-developed and well-nourished.  Wearing O2 nasal cannula. Appears obese.  HENT:  Head: Normocephalic and atraumatic.  Right Ear: Tympanic membrane and external ear normal. No decreased hearing is noted.  Left Ear: Tympanic membrane and external ear normal. No decreased hearing is noted.  Mouth/Throat: No oropharyngeal exudate or posterior oropharyngeal erythema.  Eyes: Pupils are equal, round, and reactive to light.  Neck: Normal range of motion. Neck supple.  Cardiovascular: Normal rate and regular rhythm.   No murmur heard. Pulmonary/Chest: Breath sounds normal. No  respiratory distress.  Abdominal: Soft. Bowel sounds are normal. He exhibits no mass. There is no tenderness.  Musculoskeletal: Normal range of motion. He exhibits no edema.  Neurological: He is alert and oriented to person, place, and time.  Skin: Skin is warm and dry.  Vitals reviewed.     Assessment & Plan:   Derek Shepherd was seen today for dizziness.  Diagnoses and all orders for this visit:  Dyspnea, unspecified type -     Brain natriuretic peptide -     CBC with Differential/Platelet -     CMP14+EGFR  Orthostasis       I have discontinued Derek Shepherd's doxycycline. I am also having him maintain his vitamin B-12, fluticasone, budesonide-formoterol, lactulose, multivitamin with minerals, Polyethyl Glycol-Propyl Glycol, acidophilus, senna, torsemide, mycophenolate, diphenoxylate-atropine, folic acid, umeclidinium bromide, Sirolimus, metFORMIN, glucose blood, montelukast, SYMBICORT, levalbuterol, HYDROmorphone, atorvastatin, potassium chloride, rOPINIRole, nefazodone, methylPREDNISolone, and citalopram.  Allergies as of 11/09/2016      Reactions   Nsaids Shortness Of Breath      Medication List       Accurate as of 11/09/16 11:53 AM. Always use your most recent med list.          acidophilus Caps capsule Take 1 capsule by mouth daily.   atorvastatin 40 MG tablet Commonly known as:  LIPITOR Take 1 tablet (40 mg total) by mouth daily. For cholesterol   budesonide-formoterol 160-4.5 MCG/ACT inhaler Commonly known as:  SYMBICORT Inhale 2 puffs into the lungs 2 (two) times daily.   SYMBICORT 160-4.5 MCG/ACT inhaler Generic drug:  budesonide-formoterol INHALE TWO PUFFS BY MOUTH ONCE DAILY IN THE MORNING   citalopram 40 MG tablet Commonly known as:  CELEXA TAKE ONE TABLET BY MOUTH ONCE DAILY, REDUCE TO 1/2 TABLET ONCE DAILY WHILE TAKING FLUCONAZOLE FOR 2 WEEKS.   diphenoxylate-atropine 2.5-0.025 MG tablet Commonly known as:  LOMOTIL Take 2 tablets by mouth 4 (four)  times daily as needed for diarrhea or loose stools.   fluticasone 50 MCG/ACT nasal spray Commonly known as:  FLONASE Place 1 spray into both nostrils daily.   folic acid 1 MG tablet Commonly known as:  FOLVITE TAKE ONE TABLET BY MOUTH ONCE DAILY   glucose blood test strip Use as instructed   HYDROmorphone 4 MG tablet Commonly known as:  DILAUDID Take 4 mg by mouth every 4 (four) hours.   lactulose 10 GM/15ML solution Commonly known as:  CHRONULAC Take 20 g by mouth 2 (two) times daily as needed for mild constipation.   levalbuterol 1.25 MG/3ML nebulizer solution Commonly known as:  XOPENEX USE ONE VIAL IN NEBULIZER 4 TIMES DAILY   metFORMIN 500 MG tablet Commonly known as:  GLUCOPHAGE TAKE ONE TABLET BY MOUTH TWICE DAILY WITH A MEAL   methylPREDNISolone 8 MG tablet Commonly known as:  MEDROL TAKE 1 TABLET BY MOUTH ONCE DAILY   montelukast 10 MG tablet Commonly known as:  SINGULAIR TAKE ONE TABLET BY MOUTH ONCE DAILY   multivitamin with minerals Tabs tablet Take 1 tablet by mouth daily.   mycophenolate 500 MG tablet Commonly known as:  CELLCEPT Take 500 mg by mouth.   nefazodone 50 MG tablet Commonly  known as:  SERZONE TAKE ONE TABLET BY MOUTH AT BEDTIME (NO LONGER TAKING ELIQUIS)   potassium chloride 10 MEQ CR capsule Commonly known as:  MICRO-K TAKE THREE CAPSULES BY MOUTH TWICE DAILY   rOPINIRole 3 MG tablet Commonly known as:  REQUIP TAKE 1 TABLET BY MOUTH AT BEDTIME   senna 8.6 MG tablet Commonly known as:  SENOKOT Take 2 tablets by mouth daily as needed for constipation.   Sirolimus 0.5 MG tablet Commonly known as:  RAPAMUNE TAKE ONE TABLET BY MOUTH ONCE DAILY   SYSTANE 0.4-0.3 % Soln Generic drug:  Polyethyl Glycol-Propyl Glycol Place 1 drop into both eyes as needed (for dry eyes).   torsemide 100 MG tablet Commonly known as:  DEMADEX Take 1 tablet (100 mg total) by mouth daily.   umeclidinium bromide 62.5 MCG/INH Aepb Commonly known as:   INCRUSE ELLIPTA Inhale 1 puff into the lungs daily.   vitamin B-12 1000 MCG tablet Commonly known as:  CYANOCOBALAMIN Take 1,000 mcg by mouth daily.            Discharge Care Instructions        Start     Ordered   11/09/16 0000  Brain natriuretic peptide     11/09/16 1148   11/09/16 0000  CBC with Differential/Platelet     11/09/16 1148   11/09/16 0000  CMP14+EGFR     11/09/16 1148       Follow-up: Return in about 1 month (around 12/09/2016).  Claretta Fraise, M.D.

## 2016-11-10 LAB — CBC WITH DIFFERENTIAL/PLATELET
Basophils Absolute: 0 10*3/uL (ref 0.0–0.2)
Basos: 0 %
EOS (ABSOLUTE): 0.1 10*3/uL (ref 0.0–0.4)
EOS: 1 %
HEMATOCRIT: 30.6 % — AB (ref 37.5–51.0)
HEMOGLOBIN: 9.9 g/dL — AB (ref 13.0–17.7)
IMMATURE GRANULOCYTES: 1 %
Immature Grans (Abs): 0.1 10*3/uL (ref 0.0–0.1)
Lymphocytes Absolute: 1.1 10*3/uL (ref 0.7–3.1)
Lymphs: 8 %
MCH: 33.6 pg — ABNORMAL HIGH (ref 26.6–33.0)
MCHC: 32.4 g/dL (ref 31.5–35.7)
MCV: 104 fL — AB (ref 79–97)
MONOCYTES: 4 %
Monocytes Absolute: 0.5 10*3/uL (ref 0.1–0.9)
NEUTROS PCT: 86 %
Neutrophils Absolute: 11.9 10*3/uL — ABNORMAL HIGH (ref 1.4–7.0)
Platelets: 178 10*3/uL (ref 150–379)
RBC: 2.95 x10E6/uL — AB (ref 4.14–5.80)
RDW: 15.2 % (ref 12.3–15.4)
WBC: 13.7 10*3/uL — AB (ref 3.4–10.8)

## 2016-11-10 LAB — CMP14+EGFR
ALT: 23 IU/L (ref 0–44)
AST: 21 IU/L (ref 0–40)
Albumin/Globulin Ratio: 2.7 — ABNORMAL HIGH (ref 1.2–2.2)
Albumin: 4.6 g/dL (ref 3.5–5.5)
Alkaline Phosphatase: 60 IU/L (ref 39–117)
BUN / CREAT RATIO: 14 (ref 9–20)
BUN: 12 mg/dL (ref 6–24)
Bilirubin Total: <0.2 mg/dL (ref 0.0–1.2)
CALCIUM: 9.9 mg/dL (ref 8.7–10.2)
CO2: 34 mmol/L — AB (ref 20–29)
CREATININE: 0.83 mg/dL (ref 0.76–1.27)
Chloride: 91 mmol/L — ABNORMAL LOW (ref 96–106)
GFR calc non Af Amer: 97 mL/min/{1.73_m2} (ref >59)
GFR, EST AFRICAN AMERICAN: 112 mL/min/{1.73_m2} (ref >59)
GLOBULIN, TOTAL: 1.7 g/dL (ref 1.5–4.5)
Glucose: 186 mg/dL — ABNORMAL HIGH (ref 65–99)
Potassium: 3.6 mmol/L (ref 3.5–5.2)
SODIUM: 146 mmol/L — AB (ref 134–144)
TOTAL PROTEIN: 6.3 g/dL (ref 6.0–8.5)

## 2016-11-10 LAB — BRAIN NATRIURETIC PEPTIDE: BNP: 35.1 pg/mL (ref 0.0–100.0)

## 2016-11-11 DIAGNOSIS — D89813 Graft-versus-host disease, unspecified: Secondary | ICD-10-CM | POA: Diagnosis not present

## 2016-11-11 DIAGNOSIS — Z9484 Stem cells transplant status: Secondary | ICD-10-CM | POA: Diagnosis not present

## 2016-11-11 DIAGNOSIS — C9101 Acute lymphoblastic leukemia, in remission: Secondary | ICD-10-CM | POA: Diagnosis not present

## 2016-11-11 DIAGNOSIS — D801 Nonfamilial hypogammaglobulinemia: Secondary | ICD-10-CM | POA: Diagnosis not present

## 2016-11-18 DIAGNOSIS — J479 Bronchiectasis, uncomplicated: Secondary | ICD-10-CM | POA: Diagnosis not present

## 2016-11-18 DIAGNOSIS — R69 Illness, unspecified: Secondary | ICD-10-CM | POA: Diagnosis not present

## 2016-11-22 DIAGNOSIS — G8929 Other chronic pain: Secondary | ICD-10-CM | POA: Diagnosis not present

## 2016-11-22 DIAGNOSIS — K5903 Drug induced constipation: Secondary | ICD-10-CM | POA: Diagnosis not present

## 2016-11-22 DIAGNOSIS — G894 Chronic pain syndrome: Secondary | ICD-10-CM | POA: Diagnosis not present

## 2016-11-22 DIAGNOSIS — M79605 Pain in left leg: Secondary | ICD-10-CM | POA: Diagnosis not present

## 2016-11-29 DIAGNOSIS — R69 Illness, unspecified: Secondary | ICD-10-CM | POA: Diagnosis not present

## 2016-11-29 DIAGNOSIS — D89811 Chronic graft-versus-host disease: Secondary | ICD-10-CM | POA: Diagnosis not present

## 2016-11-29 DIAGNOSIS — J449 Chronic obstructive pulmonary disease, unspecified: Secondary | ICD-10-CM | POA: Diagnosis not present

## 2016-11-29 DIAGNOSIS — I110 Hypertensive heart disease with heart failure: Secondary | ICD-10-CM | POA: Diagnosis not present

## 2016-11-29 DIAGNOSIS — C9101 Acute lymphoblastic leukemia, in remission: Secondary | ICD-10-CM | POA: Diagnosis not present

## 2016-11-29 DIAGNOSIS — D801 Nonfamilial hypogammaglobulinemia: Secondary | ICD-10-CM | POA: Diagnosis not present

## 2016-11-29 DIAGNOSIS — I5030 Unspecified diastolic (congestive) heart failure: Secondary | ICD-10-CM | POA: Diagnosis not present

## 2016-11-29 DIAGNOSIS — G4700 Insomnia, unspecified: Secondary | ICD-10-CM | POA: Diagnosis not present

## 2016-11-29 DIAGNOSIS — R21 Rash and other nonspecific skin eruption: Secondary | ICD-10-CM | POA: Diagnosis not present

## 2016-11-29 DIAGNOSIS — D89813 Graft-versus-host disease, unspecified: Secondary | ICD-10-CM | POA: Diagnosis not present

## 2016-12-01 ENCOUNTER — Other Ambulatory Visit: Payer: Self-pay | Admitting: Family Medicine

## 2016-12-01 DIAGNOSIS — E1165 Type 2 diabetes mellitus with hyperglycemia: Secondary | ICD-10-CM

## 2016-12-04 DIAGNOSIS — R0902 Hypoxemia: Secondary | ICD-10-CM | POA: Diagnosis not present

## 2016-12-13 ENCOUNTER — Ambulatory Visit (INDEPENDENT_AMBULATORY_CARE_PROVIDER_SITE_OTHER): Payer: Medicare HMO | Admitting: Family Medicine

## 2016-12-13 ENCOUNTER — Ambulatory Visit (INDEPENDENT_AMBULATORY_CARE_PROVIDER_SITE_OTHER): Payer: Medicare HMO

## 2016-12-13 ENCOUNTER — Encounter: Payer: Self-pay | Admitting: Family Medicine

## 2016-12-13 ENCOUNTER — Telehealth: Payer: Self-pay | Admitting: Family Medicine

## 2016-12-13 VITALS — BP 123/79 | HR 63 | Temp 97.6°F | Ht 69.0 in | Wt 202.0 lb

## 2016-12-13 DIAGNOSIS — J479 Bronchiectasis, uncomplicated: Secondary | ICD-10-CM

## 2016-12-13 DIAGNOSIS — R0600 Dyspnea, unspecified: Secondary | ICD-10-CM

## 2016-12-13 DIAGNOSIS — Z23 Encounter for immunization: Secondary | ICD-10-CM

## 2016-12-13 DIAGNOSIS — E119 Type 2 diabetes mellitus without complications: Secondary | ICD-10-CM | POA: Diagnosis not present

## 2016-12-13 DIAGNOSIS — J431 Panlobular emphysema: Secondary | ICD-10-CM | POA: Diagnosis not present

## 2016-12-13 DIAGNOSIS — C9101 Acute lymphoblastic leukemia, in remission: Secondary | ICD-10-CM | POA: Diagnosis not present

## 2016-12-13 LAB — CBC WITH DIFFERENTIAL/PLATELET
BASOS: 1 %
Basophils Absolute: 0.1 10*3/uL (ref 0.0–0.2)
EOS (ABSOLUTE): 0.1 10*3/uL (ref 0.0–0.4)
Eos: 1 %
HEMOGLOBIN: 9.9 g/dL — AB (ref 13.0–17.7)
Hematocrit: 30.5 % — ABNORMAL LOW (ref 37.5–51.0)
IMMATURE GRANS (ABS): 0 10*3/uL (ref 0.0–0.1)
Immature Granulocytes: 0 %
LYMPHS: 22 %
Lymphocytes Absolute: 1.7 10*3/uL (ref 0.7–3.1)
MCH: 33.2 pg — AB (ref 26.6–33.0)
MCHC: 32.5 g/dL (ref 31.5–35.7)
MCV: 102 fL — ABNORMAL HIGH (ref 79–97)
Monocytes Absolute: 0.7 10*3/uL (ref 0.1–0.9)
Monocytes: 9 %
NEUTROS ABS: 5.1 10*3/uL (ref 1.4–7.0)
Neutrophils: 67 %
Platelets: 140 10*3/uL — ABNORMAL LOW (ref 150–379)
RBC: 2.98 x10E6/uL — ABNORMAL LOW (ref 4.14–5.80)
RDW: 15 % (ref 12.3–15.4)
WBC: 7.7 10*3/uL (ref 3.4–10.8)

## 2016-12-13 LAB — CMP14+EGFR
A/G RATIO: 1.9 (ref 1.2–2.2)
ALBUMIN: 4.1 g/dL (ref 3.5–5.5)
ALT: 17 IU/L (ref 0–44)
AST: 20 IU/L (ref 0–40)
Alkaline Phosphatase: 64 IU/L (ref 39–117)
BILIRUBIN TOTAL: 0.2 mg/dL (ref 0.0–1.2)
BUN/Creatinine Ratio: 18 (ref 9–20)
BUN: 16 mg/dL (ref 6–24)
CO2: 36 mmol/L — ABNORMAL HIGH (ref 20–29)
CREATININE: 0.9 mg/dL (ref 0.76–1.27)
Calcium: 9.6 mg/dL (ref 8.7–10.2)
Chloride: 94 mmol/L — ABNORMAL LOW (ref 96–106)
GFR calc Af Amer: 108 mL/min/{1.73_m2} (ref >59)
GFR, EST NON AFRICAN AMERICAN: 93 mL/min/{1.73_m2} (ref >59)
GLOBULIN, TOTAL: 2.2 g/dL (ref 1.5–4.5)
Glucose: 91 mg/dL (ref 65–99)
Potassium: 4.2 mmol/L (ref 3.5–5.2)
Sodium: 144 mmol/L (ref 134–144)
TOTAL PROTEIN: 6.3 g/dL (ref 6.0–8.5)

## 2016-12-13 LAB — HEMOGLOBIN, FINGERSTICK: Hemoglobin: 9.4 g/dL — ABNORMAL LOW (ref 12.6–17.7)

## 2016-12-13 LAB — BAYER DCA HB A1C WAIVED: HB A1C: 5.9 % (ref <7.0)

## 2016-12-13 MED ORDER — FLUCONAZOLE 100 MG PO TABS
100.0000 mg | ORAL_TABLET | Freq: Every day | ORAL | 0 refills | Status: DC
Start: 2016-12-13 — End: 2016-12-21

## 2016-12-13 NOTE — Telephone Encounter (Signed)
I sent in the requested prescription 

## 2016-12-13 NOTE — Progress Notes (Signed)
Subjective:  Patient ID: Derek Shepherd, male    DOB: 07/26/1955  Age: 61 y.o. MRN: [id number]  CC: Follow-up (pt here today for routine follow up of his chronic medical conditions and also c/o increased fatigue)   HPI Derek Shepherd presents for Recheck of his emphysema and chronic dyspnea. Today he says he has a substernal aching soreness that extends into the epigastrium. He has his baseline shortness of breath. He continues to wear oxygen by nasal cannula. The soreness has made him feel more tired and having more trouble concentrating and having decreased energy which has led to an increased pH Q9 score but overall he says he does not feel more depressed. His wife is here with him today. She says he was recently taken off of an antibiotic that he been taking for a long time. She thinks it might be his Diflucan. Chart review from Dr. Harvel Ricks at at [facility] shows it is Bactrim.  He was taking it Monday Wednesday Friday for a long time.  Follow-up of diabetes. Patient does not check blood sugar at home Patient denies symptoms such as polyuria, polydipsia, excessive hunger, nausea No significant hypoglycemic spells noted. Medications as noted below. Taking them regularly without complication/adverse reaction being reported today.   Depression screen [facility] 2/9 12/13/2016 11/09/2016 08/29/2016  Decreased Interest _0 Down, Depressed, Hopeless _1 PHQ - 2 Score _2 Altered sleeping _3 Tired, decreased energy _4 Change in appetite 2 0 1  Feeling bad or failure about yourself  1 0 0  Trouble concentrating _5 Moving slowly or fidgety/restless _6 Suicidal thoughts 0 0 0  PHQ-9 Score _7 Difficult doing work/chores - - Very difficult  Some recent data might be hidden    History Treyce has a past medical history of Adenomatous colon polyp; Anemia; Anxiety; Arthritis; Asthma; Bowel obstruction (HCC); CHF (congestive heart failure) ([place]); COPD (chronic  obstructive pulmonary disease) ([place]); Depression; Diverticulosis; DVT (deep venous thrombosis) ([place]); Gallstones; GERD (gastroesophageal reflux disease); 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   He has a past surgical history that includes Cholecystectomy; Lung surgery (Left); Exploratory laparotomy; Hernia repair; Colonoscopy w/ biopsies; and Bone marrow transplant (2011).   His family history includes Clotting disorder in his mother; Diabetes in his brother, father, maternal aunt, and sister; Heart attack in his father and mother; Prostate cancer in his brother.He reports that he quit smoking about 23 years ago. His smoking use included Cigarettes. He has never used smokeless tobacco. He reports that he drinks alcohol. He reports that he does not use drugs.    ROS Review of Systems  Constitutional: Positive for fatigue. Negative for chills, diaphoresis, fever and unexpected weight change.  HENT: Negative for congestion, hearing loss, rhinorrhea and sore throat.   Eyes: Negative for visual disturbance.  Respiratory: Positive for shortness of breath. Negative for cough.   Cardiovascular: Positive for chest pain. Negative for palpitations and leg swelling.  Gastrointestinal: Negative for abdominal pain, constipation and diarrhea.  Genitourinary: Negative for dysuria and flank pain.  Musculoskeletal: Positive for myalgias. Negative for arthralgias and joint swelling.  Skin: Negative for rash.  Neurological: Negative for dizziness and headaches.  Psychiatric/Behavioral: Negative for dysphoric mood and sleep disturbance.    Objective:  BP 123/79   Pulse 63   Temp 97.6 F (36.4 C) (Oral)   Ht _0  (1.753 m)   Wt 202 lb (91.6 kg)   SpO2 96%   BMI 29.83 kg/m   BP Readings from Last 3 Encounters:    12/13/16 123/79  11/09/16 118/66  08/29/16 114/64    Wt Readings from Last 3 Encounters:  12/13/16 202 lb (91.6 kg)  11/09/16 201 lb (91.2 kg)  08/29/16 195 lb (88.5 kg)     Physical Exam  Constitutional: He is oriented to person, place, and time. No distress.  Appears chronically ill  HENT:  Head: Normocephalic and atraumatic.  Right Ear: Tympanic membrane and external ear normal. No decreased hearing is noted.  Left Ear: Tympanic membrane and external ear normal. No decreased hearing is noted.  Mouth/Throat: No oropharyngeal exudate or posterior oropharyngeal erythema.  Eyes: Pupils are equal, round, and reactive to light. Conjunctivae are normal.  Neck: Normal range of motion. Neck supple.  Cardiovascular: Normal rate and regular rhythm.   No murmur heard. Pulmonary/Chest: Effort normal and breath sounds normal. No respiratory distress.  Wearing O2 cannula   Abdominal: Soft. Bowel sounds are normal. He exhibits no mass. There is no tenderness.  Musculoskeletal: Normal range of motion.  Neurological: He is alert and oriented to person, place, and time.  Skin: Skin is warm and dry. No erythema.  Psychiatric: He has a normal mood and affect. His behavior is normal.  Vitals reviewed.     Assessment & Plan:   Milbern was seen today for follow-up.  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 -     fluconazole (DIFLUCAN) 100 MG tablet; Take 1 tablet (100 mg total) by mouth daily.       I have discontinued Mr. Nagorski's budesonide-formoterol and atorvastatin. I am also having him start on fluconazole. Additionally, I am having him maintain his vitamin B-12, fluticasone, lactulose, multivitamin with minerals, Polyethyl Glycol-Propyl Glycol, acidophilus, senna, torsemide, mycophenolate, diphenoxylate-atropine, folic acid, umeclidinium bromide, Sirolimus, metFORMIN, glucose blood, montelukast, SYMBICORT, levalbuterol, HYDROmorphone, potassium chloride, rOPINIRole, nefazodone, methylPREDNISolone, and citalopram.  Allergies as of 12/13/2016      Reactions   Nsaids Shortness Of Breath      Medication List       Accurate as of 12/13/16  5:27 PM. Always use your most recent med list.          acidophilus Caps capsule Take 1 capsule by mouth daily.   citalopram 40 MG tablet Commonly known as:  CELEXA TAKE ONE TABLET BY MOUTH ONCE DAILY, REDUCE TO 1/2 TABLET ONCE DAILY WHILE TAKING FLUCONAZOLE FOR 2 WEEKS.   diphenoxylate-atropine 2.5-0.025 MG tablet Commonly known as:  LOMOTIL Take 2 tablets by mouth 4 (four) times daily as needed for diarrhea or loose stools.   fluconazole  100 MG tablet Commonly known as:  DIFLUCAN Take 1 tablet (100 mg total) by mouth daily.   fluticasone 50 MCG/ACT nasal spray Commonly known as:  FLONASE Place 1 spray into both nostrils daily.   folic acid 1 MG tablet Commonly known as:  FOLVITE TAKE ONE TABLET BY MOUTH ONCE DAILY   glucose blood test strip Use as instructed   HYDROmorphone 4 MG tablet Commonly known as:  DILAUDID Take 4 mg by mouth every 4 (four) hours.   lactulose 10 GM/15ML solution Commonly known as:  CHRONULAC Take 20 g by mouth 2 (two) times daily as needed for mild constipation.   levalbuterol 1.25 MG/3ML nebulizer solution Commonly known as:  XOPENEX USE ONE VIAL IN NEBULIZER 4 TIMES DAILY   metFORMIN 500  MG tablet Commonly known as:  GLUCOPHAGE TAKE ONE TABLET BY MOUTH TWICE DAILY WITH A MEAL   methylPREDNISolone 8 MG tablet Commonly known as:  MEDROL TAKE 1 TABLET BY MOUTH ONCE DAILY   montelukast 10 MG tablet Commonly known as:  SINGULAIR TAKE ONE TABLET BY MOUTH ONCE DAILY   multivitamin with minerals Tabs tablet Take 1 tablet by mouth daily.   mycophenolate 500 MG tablet Commonly known as:  CELLCEPT Take 500 mg by mouth.   nefazodone 50 MG tablet Commonly known as:  SERZONE TAKE ONE TABLET BY MOUTH AT BEDTIME (NO LONGER TAKING ELIQUIS)   potassium chloride 10 MEQ CR capsule Commonly known as:  MICRO-K TAKE THREE CAPSULES BY MOUTH TWICE DAILY   rOPINIRole 3 MG tablet Commonly known as:  REQUIP TAKE 1 TABLET BY MOUTH AT BEDTIME   senna 8.6 MG tablet Commonly known as:  SENOKOT Take 2 tablets by mouth daily as needed for constipation.   Sirolimus 0.5 MG tablet Commonly known as:  RAPAMUNE TAKE ONE TABLET BY MOUTH ONCE DAILY   SYMBICORT 160-4.5 MCG/ACT inhaler Generic drug:  budesonide-formoterol INHALE TWO PUFFS BY MOUTH ONCE DAILY IN THE MORNING   SYSTANE 0.4-0.3 % Soln Generic drug:  Polyethyl Glycol-Propyl Glycol Place 1 drop into both eyes as needed (for dry eyes).   torsemide 100 MG tablet Commonly known as:  DEMADEX Take 1 tablet (100 mg total) by mouth daily.   umeclidinium bromide 62.5 MCG/INH Aepb Commonly known as:  INCRUSE ELLIPTA Inhale 1 puff into the lungs daily.   vitamin B-12 1000 MCG tablet Commonly known as:  CYANOCOBALAMIN Take 1,000 mcg by mouth daily.        Follow-up: No Follow-up on file.  Claretta Fraise, M.D.

## 2016-12-14 NOTE — Telephone Encounter (Signed)
Patient aware rx sent to pharmacy.  

## 2016-12-15 ENCOUNTER — Other Ambulatory Visit: Payer: Self-pay | Admitting: Family Medicine

## 2016-12-18 DIAGNOSIS — J479 Bronchiectasis, uncomplicated: Secondary | ICD-10-CM | POA: Diagnosis not present

## 2016-12-20 ENCOUNTER — Encounter: Payer: Self-pay | Admitting: Family Medicine

## 2016-12-20 ENCOUNTER — Ambulatory Visit (INDEPENDENT_AMBULATORY_CARE_PROVIDER_SITE_OTHER): Payer: Medicare HMO | Admitting: Family Medicine

## 2016-12-20 VITALS — BP 121/81 | HR 101 | Temp 98.3°F | Ht 69.0 in | Wt 202.0 lb

## 2016-12-20 DIAGNOSIS — D649 Anemia, unspecified: Secondary | ICD-10-CM

## 2016-12-20 DIAGNOSIS — K279 Peptic ulcer, site unspecified, unspecified as acute or chronic, without hemorrhage or perforation: Secondary | ICD-10-CM

## 2016-12-20 DIAGNOSIS — J471 Bronchiectasis with (acute) exacerbation: Secondary | ICD-10-CM

## 2016-12-20 MED ORDER — PANTOPRAZOLE SODIUM 40 MG PO TBEC: 40.0000 mg | DELAYED_RELEASE_TABLET | Freq: Every day | ORAL | 3 refills | Status: DC

## 2016-12-20 MED ORDER — BETAMETHASONE SOD PHOS & ACET 6 (3-3) MG/ML IJ SUSP
6.0000 mg | Freq: Once | INTRAMUSCULAR | Status: AC
  Administered 2016-12-20: 6 mg via INTRAMUSCULAR

## 2016-12-20 MED ORDER — LEVOFLOXACIN 500 MG PO TABS: 500.0000 mg | ORAL_TABLET | Freq: Every day | ORAL | 0 refills | Status: DC

## 2016-12-20 NOTE — Progress Notes (Signed)
Subjective:  Patient ID: Derek Shepherd, male    DOB: Jul 21, 1955  Age: 61 y.o. MRN: [id number]  CC: Abdominal Pain (pt here today c/o being sick on his stomach and also getting tired and SOB with walking)   HPI Derek Shepherd presents for Epigastric discomfort. It is a burning and it gets worse when he takes a bite of food. He's also had some dry cough. He says he feels weak and winded after just taking a few steps over the last several days. This is in spite of using his oxygen. He is no longer taking his methylprednisolone. Blood sugars have been in a good range lately. He denies melena or hematochezia. He did have some emesis that he says was a dark liquid with food mixed in yesterday. He also had multiple bowel movements yesterday but not today and they were formed - no diarrhea was noted.  Depression screen [facility] 2/9 12/20/2016 12/13/2016 11/09/2016  Decreased Interest 1 1 1   Down, Depressed, Hopeless 1 1 1   PHQ - 2 Score 2 2 2   Altered sleeping 1 1 1   Tired, decreased energy 3 3 1   Change in appetite 2 2 0  Feeling bad or failure about yourself  1 1 0  Trouble concentrating 3 3 2   Moving slowly or fidgety/restless 2 2 2   Suicidal thoughts 0 0 0  PHQ-9 Score 14 14 8   Difficult doing work/chores - - -  Some recent data might be hidden    History Derek Shepherd has a past medical history of Adenomatous colon polyp; Anemia; Anxiety; Arthritis; Asthma; Bowel obstruction (HCC); CHF (congestive heart failure) ([place]); COPD (chronic obstructive pulmonary disease) ([place]); Depression; Diverticulosis; DVT (deep venous thrombosis) ([place]); Gallstones; GERD (gastroesophageal reflux disease); 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   He has a past surgical history that includes Cholecystectomy; Lung surgery (Left);  Exploratory laparotomy; Hernia repair; Colonoscopy w/ biopsies; and Bone marrow transplant (2011).   His family history includes Clotting disorder in his mother; Diabetes in his brother, father, maternal aunt, and sister; Heart attack in his father and mother; Prostate cancer in his brother.He reports that he quit smoking about 23 years ago. His smoking use included Cigarettes. He has never used smokeless tobacco. He reports that he drinks alcohol. He reports that he does not use drugs.    ROS Review of Systems  Constitutional: Negative for chills, diaphoresis and fever.  HENT: Negative for rhinorrhea and sore throat.   Respiratory: Negative for cough and shortness of breath.   Cardiovascular: Negative for chest pain.  Gastrointestinal: Positive for abdominal distention, abdominal pain, nausea and vomiting (X1 with probable hematemesis). Negative for anal bleeding and blood in stool.  Musculoskeletal: Negative for arthralgias and myalgias.  Skin: Negative for rash.  Neurological: Negative for weakness and headaches.    Objective:  BP 121/81   Pulse (!) 101   Temp 98.3 F (36.8 C) (Oral)   Ht 5\' 9"  (1.753 m)   Wt 202 lb (91.6 kg)   SpO2 97%   BMI 29.83 kg/m   BP Readings from Last 3 Encounters:  12/20/16 121/81  12/13/16 123/79  11/09/16 118/66    Wt Readings from Last 3 Encounters:  12/20/16 202 lb (91.6 kg)  12/13/16 202 lb (91.6 kg)  11/09/16 201 lb (91.2 kg)     Physical Exam  Constitutional: He is oriented  to person, place, and time. He appears well-developed. No distress.  Chronically ill. [place], wearing O2 cannula  HENT:  Head: Normocephalic and atraumatic.  Right Ear: External ear normal.  Left Ear: External ear normal.  Nose: Nose normal.  Mouth/Throat: Oropharynx is clear and moist.  Eyes: Pupils are equal, round, and reactive to light. Conjunctivae and EOM are normal.  Neck: Normal range of motion. Neck supple. No thyromegaly present.  Cardiovascular:  Normal rate, regular rhythm and normal heart sounds.   No murmur heard. Pulmonary/Chest: No respiratory distress. He has wheezes. He has no rales. He exhibits tenderness.  Abdominal: Soft. Bowel sounds are normal. He exhibits no distension. There is no tenderness.  Lymphadenopathy:    He has no cervical adenopathy.  Neurological: He is alert and oriented to person, place, and time. He has normal reflexes.  Skin: Skin is warm and dry.  Psychiatric: He has a normal mood and affect. His behavior is normal. Judgment and thought content normal.      Assessment & Plan:   Derek Shepherd was seen today for abdominal pain.  Diagnoses and all orders for this visit:  Bronchiectasis with acute exacerbation (HCC) -     betamethasone acetate-betamethasone sodium phosphate (CELESTONE) injection 6 mg; Inject 1 mL (6 mg total) into the muscle once. -     CBC with Differential/Platelet  PUD (peptic ulcer disease)  Anemia, unspecified type  Other orders -     levofloxacin (LEVAQUIN) 500 MG tablet; Take 1 tablet (500 mg total) by mouth daily. -     pantoprazole (PROTONIX) 40 MG tablet; Take 1 tablet (40 mg total) by mouth daily.       I am having [name] start on levofloxacin and pantoprazole. I am also having him maintain his vitamin B-12, fluticasone, lactulose, multivitamin with minerals, Polyethyl Glycol-Propyl Glycol, acidophilus, senna, torsemide, mycophenolate, diphenoxylate-atropine, umeclidinium bromide, Sirolimus, metFORMIN, glucose blood, montelukast, SYMBICORT, levalbuterol, HYDROmorphone, potassium chloride, rOPINIRole, nefazodone, methylPREDNISolone, citalopram, fluconazole, and folic acid. We administered betamethasone acetate-betamethasone sodium phosphate.  Allergies as of 12/20/2016      Reactions   Nsaids Shortness Of Breath      Medication List       Accurate as of 12/20/16  6:31 PM. Always use your most recent med list.          acidophilus Caps capsule Take 1 capsule  by mouth daily.   citalopram 40 MG tablet Commonly known as:  CELEXA TAKE ONE TABLET BY MOUTH ONCE DAILY, REDUCE TO 1/2 TABLET ONCE DAILY WHILE TAKING FLUCONAZOLE FOR 2 WEEKS.   diphenoxylate-atropine 2.5-0.025 MG tablet Commonly known as:  LOMOTIL Take 2 tablets by mouth 4 (four) times daily as needed for diarrhea or loose stools.   fluconazole 100 MG tablet Commonly known as:  DIFLUCAN Take 1 tablet (100 mg total) by mouth daily.   fluticasone 50 MCG/ACT nasal spray Commonly known as:  FLONASE Place 1 spray into both nostrils daily.   folic acid 1 MG tablet Commonly known as:  FOLVITE TAKE 1 TABLET BY MOUTH ONCE DAILY   glucose blood test strip Use as instructed   HYDROmorphone 4 MG tablet Commonly known as:  DILAUDID Take 4 mg by mouth every 4 (four) hours.   lactulose 10 GM/15ML solution Commonly known as:  CHRONULAC Take 20 g by mouth 2 (two) times daily as needed for mild constipation.   levalbuterol 1.25 MG/3ML nebulizer solution Commonly known as:  XOPENEX USE ONE VIAL IN NEBULIZER 4 TIMES DAILY   levofloxacin  500 MG tablet Commonly known as:  LEVAQUIN Take 1 tablet (500 mg total) by mouth daily.   metFORMIN 500 MG tablet Commonly known as:  GLUCOPHAGE TAKE ONE TABLET BY MOUTH TWICE DAILY WITH A MEAL   methylPREDNISolone 8 MG tablet Commonly known as:  MEDROL TAKE 1 TABLET BY MOUTH ONCE DAILY   montelukast 10 MG tablet Commonly known as:  SINGULAIR TAKE ONE TABLET BY MOUTH ONCE DAILY   multivitamin with minerals Tabs tablet Take 1 tablet by mouth daily.   mycophenolate 500 MG tablet Commonly known as:  CELLCEPT Take 500 mg by mouth.   nefazodone 50 MG tablet Commonly known as:  SERZONE TAKE ONE TABLET BY MOUTH AT BEDTIME (NO LONGER TAKING ELIQUIS)   pantoprazole 40 MG tablet Commonly known as:  PROTONIX Take 1 tablet (40 mg total) by mouth daily.   potassium chloride 10 MEQ CR capsule Commonly known as:  MICRO-K TAKE THREE CAPSULES BY  MOUTH TWICE DAILY   rOPINIRole 3 MG tablet Commonly known as:  REQUIP TAKE 1 TABLET BY MOUTH AT BEDTIME   senna 8.6 MG tablet Commonly known as:  SENOKOT Take 2 tablets by mouth daily as needed for constipation.   Sirolimus 0.5 MG tablet Commonly known as:  RAPAMUNE TAKE ONE TABLET BY MOUTH ONCE DAILY   SYMBICORT 160-4.5 MCG/ACT inhaler Generic drug:  budesonide-formoterol INHALE TWO PUFFS BY MOUTH ONCE DAILY IN THE MORNING   SYSTANE 0.4-0.3 % Soln Generic drug:  Polyethyl Glycol-Propyl Glycol Place 1 drop into both eyes as needed (for dry eyes).   torsemide 100 MG tablet Commonly known as:  DEMADEX Take 1 tablet (100 mg total) by mouth daily.   umeclidinium bromide 62.5 MCG/INH Aepb Commonly known as:  INCRUSE ELLIPTA Inhale 1 puff into the lungs daily.   vitamin B-12 1000 MCG tablet Commonly known as:  CYANOCOBALAMIN Take 1,000 mcg by mouth daily.        Follow-up: Return in about 2 weeks (around 01/03/2017), or if symptoms worsen or fail to improve.  Claretta Fraise, M.D.

## 2016-12-21 ENCOUNTER — Other Ambulatory Visit: Payer: Self-pay | Admitting: Family Medicine

## 2016-12-21 LAB — CBC WITH DIFFERENTIAL/PLATELET
BASOS: 1 %
Basophils Absolute: 0.1 10*3/uL (ref 0.0–0.2)
EOS (ABSOLUTE): 0.1 10*3/uL (ref 0.0–0.4)
EOS: 1 %
HEMATOCRIT: 30.2 % — AB (ref 37.5–51.0)
Hemoglobin: 10.3 g/dL — ABNORMAL LOW (ref 13.0–17.7)
Immature Grans (Abs): 0 10*3/uL (ref 0.0–0.1)
Immature Granulocytes: 0 %
LYMPHS: 20 %
Lymphocytes Absolute: 1.7 10*3/uL (ref 0.7–3.1)
MCH: 33.1 pg — AB (ref 26.6–33.0)
MCHC: 34.1 g/dL (ref 31.5–35.7)
MCV: 97 fL (ref 79–97)
MONOCYTES: 6 %
Monocytes Absolute: 0.6 10*3/uL (ref 0.1–0.9)
NEUTROS ABS: 6.1 10*3/uL (ref 1.4–7.0)
Neutrophils: 72 %
Platelets: 148 10*3/uL — ABNORMAL LOW (ref 150–379)
RBC: 3.11 x10E6/uL — ABNORMAL LOW (ref 4.14–5.80)
RDW: 14.7 % (ref 12.3–15.4)
WBC: 8.6 10*3/uL (ref 3.4–10.8)

## 2017-01-01 ENCOUNTER — Encounter (HOSPITAL_COMMUNITY): Payer: Self-pay

## 2017-01-01 ENCOUNTER — Inpatient Hospital Stay (HOSPITAL_COMMUNITY)
Admission: EM | Admit: 2017-01-01 | Discharge: 2017-01-02 | DRG: 871 | Disposition: A | Discharge status: Other Acute Inpatient Hospital | Payer: MEDICARE | Attending: Internal Medicine | Admitting: Internal Medicine

## 2017-01-01 ENCOUNTER — Emergency Department (HOSPITAL_COMMUNITY): Payer: MEDICARE

## 2017-01-01 DIAGNOSIS — K219 Gastro-esophageal reflux disease without esophagitis: Secondary | ICD-10-CM | POA: Diagnosis not present

## 2017-01-01 DIAGNOSIS — Z79899 Other long term (current) drug therapy: Secondary | ICD-10-CM | POA: Diagnosis not present

## 2017-01-01 DIAGNOSIS — R69 Illness, unspecified: Secondary | ICD-10-CM | POA: Diagnosis not present

## 2017-01-01 DIAGNOSIS — Z856 Personal history of leukemia: Secondary | ICD-10-CM

## 2017-01-01 DIAGNOSIS — N289 Disorder of kidney and ureter, unspecified: Secondary | ICD-10-CM | POA: Diagnosis not present

## 2017-01-01 DIAGNOSIS — Z7984 Long term (current) use of oral hypoglycemic drugs: Secondary | ICD-10-CM | POA: Diagnosis not present

## 2017-01-01 DIAGNOSIS — R509 Fever, unspecified: Secondary | ICD-10-CM | POA: Diagnosis present

## 2017-01-01 DIAGNOSIS — Z886 Allergy status to analgesic agent status: Secondary | ICD-10-CM | POA: Diagnosis not present

## 2017-01-01 DIAGNOSIS — Z7951 Long term (current) use of inhaled steroids: Secondary | ICD-10-CM

## 2017-01-01 DIAGNOSIS — Z87898 Personal history of other specified conditions: Secondary | ICD-10-CM

## 2017-01-01 DIAGNOSIS — A419 Sepsis, unspecified organism: Principal | ICD-10-CM | POA: Diagnosis present

## 2017-01-01 DIAGNOSIS — I509 Heart failure, unspecified: Secondary | ICD-10-CM | POA: Diagnosis present

## 2017-01-01 DIAGNOSIS — Z7952 Long term (current) use of systemic steroids: Secondary | ICD-10-CM

## 2017-01-01 DIAGNOSIS — Z9481 Bone marrow transplant status: Secondary | ICD-10-CM | POA: Diagnosis not present

## 2017-01-01 DIAGNOSIS — F1120 Opioid dependence, uncomplicated: Secondary | ICD-10-CM | POA: Diagnosis present

## 2017-01-01 DIAGNOSIS — Z86718 Personal history of other venous thrombosis and embolism: Secondary | ICD-10-CM | POA: Diagnosis not present

## 2017-01-01 DIAGNOSIS — Z87891 Personal history of nicotine dependence: Secondary | ICD-10-CM

## 2017-01-01 DIAGNOSIS — R400 Somnolence: Secondary | ICD-10-CM | POA: Diagnosis not present

## 2017-01-01 DIAGNOSIS — D638 Anemia in other chronic diseases classified elsewhere: Secondary | ICD-10-CM | POA: Diagnosis not present

## 2017-01-01 DIAGNOSIS — Z9981 Dependence on supplemental oxygen: Secondary | ICD-10-CM

## 2017-01-01 DIAGNOSIS — M1990 Unspecified osteoarthritis, unspecified site: Secondary | ICD-10-CM | POA: Diagnosis not present

## 2017-01-01 DIAGNOSIS — E119 Type 2 diabetes mellitus without complications: Secondary | ICD-10-CM | POA: Diagnosis not present

## 2017-01-01 DIAGNOSIS — F329 Major depressive disorder, single episode, unspecified: Secondary | ICD-10-CM | POA: Diagnosis present

## 2017-01-01 DIAGNOSIS — J189 Pneumonia, unspecified organism: Secondary | ICD-10-CM | POA: Diagnosis not present

## 2017-01-01 DIAGNOSIS — J441 Chronic obstructive pulmonary disease with (acute) exacerbation: Secondary | ICD-10-CM | POA: Diagnosis not present

## 2017-01-01 DIAGNOSIS — F419 Anxiety disorder, unspecified: Secondary | ICD-10-CM | POA: Diagnosis not present

## 2017-01-01 DIAGNOSIS — R0602 Shortness of breath: Secondary | ICD-10-CM | POA: Diagnosis not present

## 2017-01-01 DIAGNOSIS — D696 Thrombocytopenia, unspecified: Secondary | ICD-10-CM | POA: Diagnosis not present

## 2017-01-01 DIAGNOSIS — Z8572 Personal history of non-Hodgkin lymphomas: Secondary | ICD-10-CM

## 2017-01-01 LAB — I-STAT ARTERIAL BLOOD GAS, ED
ACID-BASE EXCESS: 18 mmol/L — AB (ref 0.0–2.0)
Bicarbonate: 42.8 mmol/L — ABNORMAL HIGH (ref 20.0–28.0)
O2 Saturation: 99 %
PH ART: 7.482 — AB (ref 7.350–7.450)
TCO2: 44 mmol/L — ABNORMAL HIGH (ref 22–32)
pCO2 arterial: 58.2 mmHg — ABNORMAL HIGH (ref 32.0–48.0)
pO2, Arterial: 164 mmHg — ABNORMAL HIGH (ref 83.0–108.0)

## 2017-01-01 LAB — COMPREHENSIVE METABOLIC PANEL
ALK PHOS: 70 U/L (ref 38–126)
ALT: 23 U/L (ref 17–63)
AST: 35 U/L (ref 15–41)
Albumin: 3.7 g/dL (ref 3.5–5.0)
Anion gap: 12 (ref 5–15)
BUN: 15 mg/dL (ref 6–20)
CALCIUM: 9.7 mg/dL (ref 8.9–10.3)
CO2: 36 mmol/L — ABNORMAL HIGH (ref 22–32)
CREATININE: 1.3 mg/dL — AB (ref 0.61–1.24)
Chloride: 91 mmol/L — ABNORMAL LOW (ref 101–111)
GFR, EST NON AFRICAN AMERICAN: 59 mL/min — AB (ref >60)
Glucose, Bld: 172 mg/dL — ABNORMAL HIGH (ref 65–99)
Potassium: 4 mmol/L (ref 3.5–5.1)
Sodium: 139 mmol/L (ref 135–145)
Total Bilirubin: 0.4 mg/dL (ref 0.3–1.2)
Total Protein: 7.3 g/dL (ref 6.5–8.1)

## 2017-01-01 LAB — CBC WITH DIFFERENTIAL/PLATELET
BASOS ABS: 0 10*3/uL (ref 0.0–0.1)
BASOS PCT: 0 %
EOS PCT: 0 %
Eosinophils Absolute: 0 10*3/uL (ref 0.0–0.7)
HCT: 33.5 % — ABNORMAL LOW (ref 39.0–52.0)
Hemoglobin: 10.4 g/dL — ABNORMAL LOW (ref 13.0–17.0)
Lymphocytes Relative: 10 %
Lymphs Abs: 0.9 10*3/uL (ref 0.7–4.0)
MCH: 32.7 pg (ref 26.0–34.0)
MCHC: 31 g/dL (ref 30.0–36.0)
MCV: 105.3 fL — ABNORMAL HIGH (ref 78.0–100.0)
MONO ABS: 0.6 10*3/uL (ref 0.1–1.0)
Monocytes Relative: 7 %
Neutro Abs: 6.9 10*3/uL (ref 1.7–7.7)
Neutrophils Relative %: 83 %
PLATELETS: 83 10*3/uL — AB (ref 150–400)
RBC: 3.18 MIL/uL — ABNORMAL LOW (ref 4.22–5.81)
RDW: 14.5 % (ref 11.5–15.5)
WBC: 8.4 10*3/uL (ref 4.0–10.5)

## 2017-01-01 LAB — PROTIME-INR
INR: 0.95
Prothrombin Time: 12.6 seconds (ref 11.4–15.2)

## 2017-01-01 LAB — URINALYSIS, ROUTINE W REFLEX MICROSCOPIC
Bacteria, UA: NONE SEEN
Bilirubin Urine: NEGATIVE
GLUCOSE, UA: NEGATIVE mg/dL
HGB URINE DIPSTICK: NEGATIVE
Ketones, ur: NEGATIVE mg/dL
Leukocytes, UA: NEGATIVE
Nitrite: NEGATIVE
Protein, ur: 100 mg/dL — AB
SPECIFIC GRAVITY, URINE: 1.011 (ref 1.005–1.030)
Squamous Epithelial / LPF: NONE SEEN
pH: 7 (ref 5.0–8.0)

## 2017-01-01 LAB — I-STAT CG4 LACTIC ACID, ED: LACTIC ACID, VENOUS: 2 mmol/L — AB (ref 0.5–1.9)

## 2017-01-01 MED ORDER — SODIUM CHLORIDE 0.9 % IV BOLUS (SEPSIS)
1000.0000 mL | Freq: Once | INTRAVENOUS | Status: AC
  Administered 2017-01-01: 1000 mL via INTRAVENOUS

## 2017-01-01 MED ORDER — SODIUM CHLORIDE 0.9 % IV BOLUS (SEPSIS)
1000.0000 mL | Freq: Once | INTRAVENOUS | Status: AC
  Administered 2017-01-02: 1000 mL via INTRAVENOUS

## 2017-01-01 MED ORDER — SODIUM CHLORIDE 0.9 % IV BOLUS (SEPSIS): 1000.0000 mL | Freq: Once | INTRAVENOUS | Status: DC

## 2017-01-01 MED ORDER — VANCOMYCIN HCL 10 G IV SOLR
1500.0000 mg | Freq: Once | INTRAVENOUS | Status: AC
  Administered 2017-01-02: 1500 mg via INTRAVENOUS
  Filled 2017-01-01: qty 1500

## 2017-01-01 MED ORDER — PIPERACILLIN-TAZOBACTAM 3.375 G IVPB 30 MIN
3.3750 g | Freq: Once | INTRAVENOUS | Status: AC
  Administered 2017-01-01: 3.375 g via INTRAVENOUS
  Filled 2017-01-01: qty 50

## 2017-01-01 MED ORDER — ACETAMINOPHEN 325 MG PO TABS
ORAL_TABLET | ORAL | Status: AC
  Administered 2017-01-01: 650 mg via ORAL
  Filled 2017-01-01: qty 2

## 2017-01-01 MED ORDER — ACETAMINOPHEN 325 MG PO TABS
650.0000 mg | ORAL_TABLET | Freq: Once | ORAL | Status: AC
  Administered 2017-01-01: 650 mg via ORAL

## 2017-01-01 MED ORDER — VANCOMYCIN HCL IN DEXTROSE 1-5 GM/200ML-% IV SOLN: 1000.0000 mg | Freq: Once | INTRAVENOUS | Status: DC

## 2017-01-01 NOTE — ED Triage Notes (Signed)
Onset 2-3 days productive cough,  yellow phlegm, shortness of breath, fever, and weakness.  On 5L oxygen via [place].  Wife diagnosed with pneumonia.

## 2017-01-01 NOTE — ED Notes (Signed)
ABG collected  

## 2017-01-01 NOTE — ED Provider Notes (Signed)
[facility] EMERGENCY DEPARTMENT Provider Note   CSN: [id number] Arrival date & time: 01/01/17  2220     History   Chief Complaint Chief Complaint  Patient presents with  . Cough  . Fever  . Shortness of Breath    HPI Derek Shepherd is a 61 y.o. male.  Patient with history of COPD on home oxygen 5 L presenting with worsening shortness of breath and cough for the past 3 days.  Febrile tachycardic and tachypneic on arrival.  Wife was recently diagnosed with pneumonia.  Patient states he is coughing up yellow sputum.  Has been using his nebulizer machine at home without relief.  Did not know he was relatively arrives to the ED.  Has some generalized soreness in his chest from coughing.  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  History of CHF and COPD.  Endorses poor appetite without nausea or vomiting. 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   The history is provided by the patient and a relative. The history is limited by the condition of the patient.    Past Medical History:  Diagnosis Date  . Adenomatous colon polyp    tubular  . Anemia   . Anxiety   . Arthritis   . Asthma   . Bowel obstruction (HCC)    constipation  . CHF (congestive heart failure) ([place])   . COPD (chronic obstructive pulmonary disease) ([place])   . Depression   . Diverticulosis   . DVT (deep venous thrombosis) ([place])   . Gallstones   . GERD (gastroesophageal reflux disease)   . 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 History of bone marrow transplant ([place])   . 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 Personal history of colonic  adenoma 01/22/2008    Patient Active Problem List   Diagnosis Date Noted  . Bronchiectasis ([place]) 12/13/2016  . Symptomatic anemia 04/07/2016  . Hyponatremia 03/04/2016  . 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 Anemia of chronic  disease 11/24/2015  . 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 Thrombocytopenia ([place]) 11/24/2015  . Depression 11/24/2015  . Chronic combined systolic and diastolic CHF (congestive heart failure) ([place]) 11/24/2015  . Controlled type 2 diabetes mellitus without complication, without long-term current use of insulin ([place]) 11/24/2015  . History of bone marrow transplant ([place])   . ALL (acute lymphoid leukemia) in remission ([place]) 03/20/2013    Past Surgical History:  Procedure Laterality Date  . BONE MARROW TRANSPLANT  2011  . CHOLECYSTECTOMY    . COLONOSCOPY W/ BIOPSIES    . EXPLORATORY LAPAROTOMY    . HERNIA REPAIR     x 2  . LUNG SURGERY Left        Home Medications    Prior to Admission medications   Medication Sig Start Date End Date Taking? Authorizing Provider  acidophilus (RISAQUAD) CAPS capsule Take 1 capsule by mouth daily.    [provider]  citalopram (CELEXA) 40 MG tablet TAKE ONE TABLET BY MOUTH ONCE DAILY, REDUCE TO 1/2 TABLET ONCE DAILY WHILE TAKING FLUCONAZOLE FOR 2 WEEKS. 11/08/16   Claretta Fraise, MD  diphenoxylate-atropine (LOMOTIL) 2.5-0.025 MG tablet Take 2 tablets by mouth 4 (four) times daily as needed for diarrhea or loose stools. 05/23/16   Claretta Fraise, MD  fluconazole (DIFLUCAN) 100 MG tablet TAKE 1 TABLET BY MOUTH ONCE DAILY 12/23/16   Claretta Fraise, MD  fluticasone [facility])  50 MCG/ACT nasal spray Place 1 spray into both nostrils daily. 02/04/16   Claretta Fraise, MD  folic acid (FOLVITE) 1 MG tablet TAKE 1 TABLET BY MOUTH ONCE DAILY 12/16/16   Claretta Fraise, MD  glucose blood test strip Use as instructed 07/07/16   Eustaquio Maize, MD  HYDROmorphone (DILAUDID) 4 MG tablet Take 4 mg by mouth every 4 (four) hours. 08/22/16   [provider]  lactulose (CHRONULAC) 10 GM/15ML solution Take 20 g by mouth 2 (two) times daily as needed for mild constipation.    [provider]  levalbuterol Penne Lash) 1.25 MG/3ML nebulizer solution  USE ONE VIAL IN NEBULIZER 4 TIMES DAILY 08/19/16   Claretta Fraise, MD  levofloxacin (LEVAQUIN) 500 MG tablet Take 1 tablet (500 mg total) by mouth daily. 12/20/16   Claretta Fraise, MD  metFORMIN (GLUCOPHAGE) 500 MG tablet TAKE ONE TABLET BY MOUTH TWICE DAILY WITH A MEAL 06/13/16   Claretta Fraise, MD  methylPREDNISolone (MEDROL) 8 MG tablet TAKE 1 TABLET BY MOUTH ONCE DAILY 10/17/16   Claretta Fraise, MD  montelukast (SINGULAIR) 10 MG tablet TAKE ONE TABLET BY MOUTH ONCE DAILY 08/12/16   Claretta Fraise, MD  Multiple Vitamin (MULTIVITAMIN WITH MINERALS) TABS tablet Take 1 tablet by mouth daily.    [provider]  mycophenolate (CELLCEPT) 500 MG tablet Take 500 mg by mouth. 05/10/16   [provider]  nefazodone (SERZONE) 50 MG tablet TAKE ONE TABLET BY MOUTH AT BEDTIME (NO LONGER TAKING ELIQUIS) 10/10/16   Claretta Fraise, MD  pantoprazole (PROTONIX) 40 MG tablet Take 1 tablet (40 mg total) by mouth daily. 12/20/16   Claretta Fraise, MD  Polyethyl Glycol-Propyl Glycol (SYSTANE) 0.4-0.3 % SOLN Place 1 drop into both eyes as needed (for dry eyes).    [provider]  potassium chloride (MICRO-K) 10 MEQ CR capsule TAKE THREE CAPSULES BY MOUTH TWICE DAILY 08/30/16   Claretta Fraise, MD  rOPINIRole (REQUIP) 3 MG tablet TAKE 1 TABLET BY MOUTH AT BEDTIME 09/20/16   Claretta Fraise, MD  senna (SENOKOT) 8.6 MG tablet Take 2 tablets by mouth daily as needed for constipation.    [provider]  Sirolimus 0.5 MG TABS TAKE ONE TABLET BY MOUTH ONCE DAILY 06/14/16   Claretta Fraise, MD  SYMBICORT 160-4.5 MCG/ACT inhaler INHALE TWO PUFFS BY MOUTH ONCE DAILY IN THE MORNING 08/19/16   Claretta Fraise, MD  torsemide (DEMADEX) 100 MG tablet Take 1 tablet (100 mg total) by mouth daily. Patient taking differently: Take 50 mg by mouth daily.  03/06/16 03/06/17  Isaac Bliss, Rayford Halsted, MD  umeclidinium bromide (INCRUSE ELLIPTA) 62.5 MCG/INH AEPB Inhale 1 puff into the lungs daily. 06/07/16   Claretta Fraise, MD  vitamin B-12 (CYANOCOBALAMIN) 1000 MCG tablet Take 1,000 mcg by mouth daily.     [provider]    Family History Family History  Problem Relation Age of Onset  . Clotting disorder Mother   . Heart attack Mother   . Diabetes Father   . Heart attack Father   . Prostate cancer Brother   . Diabetes Brother        x 3  . Diabetes Sister        x 3  . Diabetes Maternal Aunt     Social History Social History  Substance Use Topics  . Smoking status: Former Smoker    Types: Cigarettes    Quit date: 07/13/1993  . Smokeless tobacco: Never Used  . Alcohol use 0.0 oz/week  Comment: socially     Allergies   Nsaids   Review of Systems Review of Systems  Constitutional: Positive for activity change, appetite change, chills and fatigue. Negative for fever.  HENT: Positive for congestion and rhinorrhea.   Eyes: Negative for visual disturbance.  Respiratory: Positive for cough, chest tightness and shortness of breath.   Cardiovascular: Negative for chest pain.  Gastrointestinal: Negative for abdominal pain and nausea.  Genitourinary: Negative for testicular pain.  Musculoskeletal: Positive for arthralgias and myalgias.  Skin: Negative for wound.  Neurological: Positive for weakness. Negative for dizziness and numbness.   all other systems are negative except as noted in the HPI and PMH.     Physical Exam Updated Vital Signs BP 100/74 (BP Location: Right Arm)   Pulse (!) 114   Temp (!) 102.6 F (39.2 C) (Oral)   Resp (!) 32   Ht 5\' 6"  (1.676 m)   Wt 88 kg (194 lb)   SpO2 95%   BMI 31.31 kg/m   Physical Exam  Constitutional: He is oriented to person, place, and time. He appears well-developed and well-nourished. He appears distressed.  Ill appearing. Dry mucus membranes  HENT:  Head: Normocephalic and atraumatic.  Mouth/Throat: Oropharynx is clear and moist. No oropharyngeal exudate.  Eyes: Pupils are equal, round, and reactive to light.  Conjunctivae and EOM are normal.  Neck: Normal range of motion. Neck supple.  No meningismus.  Cardiovascular: Normal rate, regular rhythm, normal heart sounds and intact distal pulses.   No murmur heard. Pulmonary/Chest: He is in respiratory distress. He has wheezes.  Diminished breath sounds bilaterally, no wheezing  Abdominal: Soft. There is no tenderness. There is no rebound and no guarding.  Musculoskeletal: Normal range of motion. He exhibits edema. He exhibits no tenderness.  +1 edema to knees  Neurological: He is alert and oriented to person, place, and time. No cranial nerve deficit. He exhibits normal muscle tone. Coordination normal.   5/5 strength throughout. CN 2-12 intact.Equal grip strength.   Skin: Skin is warm.  Psychiatric: He has a normal mood and affect. His behavior is normal.  Nursing note and vitals reviewed.    ED Treatments / Results  Labs (all labs ordered are listed, but only abnormal results are displayed) Labs Reviewed  COMPREHENSIVE METABOLIC PANEL - Abnormal; Notable for the following:       Result Value   Chloride 91 (*)    CO2 36 (*)    Glucose, Bld 172 (*)    Creatinine, Ser 1.30 (*)    GFR calc non Af Amer 59 (*)    All other components within normal limits  CBC WITH DIFFERENTIAL/PLATELET - Abnormal; Notable for the following:    RBC 3.18 (*)    Hemoglobin 10.4 (*)    HCT 33.5 (*)    MCV 105.3 (*)    Platelets 83 (*)    All other components within normal limits  URINALYSIS, ROUTINE W REFLEX MICROSCOPIC - Abnormal; Notable for the following:    Protein, ur 100 (*)    All other components within normal limits  I-STAT CG4 LACTIC ACID, ED - Abnormal; Notable for the following:    Lactic Acid, Venous 2.00 (*)    All other components within normal limits  CULTURE, BLOOD (ROUTINE X 2)  CULTURE, BLOOD (ROUTINE X 2)  URINE CULTURE  PROTIME-INR  CBC WITH DIFFERENTIAL/PLATELET  BRAIN NATRIURETIC PEPTIDE  I-STAT CG4 LACTIC ACID, ED  I-STAT  ARTERIAL BLOOD GAS, ED    EKG  EKG Interpretation  Date/Time:  Sunday January 01 2017 22:31:19 EDT Ventricular Rate:  118 PR Interval:  126 QRS Duration: 84 QT Interval:  338 QTC Calculation: 473 R Axis:   23 Text Interpretation:  Sinus tachycardia Otherwise normal ECG Rate faster Artifact Confirmed by Ezequiel Essex [phone]) on 01/02/2017 12:35:42 AM       Radiology Dg Chest Portable 1 View  Result Date: 01/01/2017 CLINICAL DATA:  Shortness of breath for 5 days. EXAM: PORTABLE CHEST 1 VIEW COMPARISON:  Radiograph 12/13/2016, additional priors. Most recent chest CT 02/25/2016 FINDINGS: Tip of the right chest port in the distal SVC, unchanged. Unchanged heart size and mediastinal contours allowing for differences in technique. No pulmonary edema. Left basilar scarring is unchanged. No confluent consolidation, large pleural effusion or pneumothorax. Postsurgical change involving the left ribs. IMPRESSION: No acute abnormality.  Stable left basilar scarring. Electronically Signed   By: Jeb Levering M.D.   On: 01/01/2017 23:55    Procedures Procedures (including critical care time)  Medications Ordered in ED Medications  sodium chloride 0.9 % bolus 1,000 mL (not administered)    And  sodium chloride 0.9 % bolus 1,000 mL (not administered)    And  sodium chloride 0.9 % bolus 1,000 mL (not administered)  piperacillin-tazobactam (ZOSYN) IVPB 3.375 g (not administered)  vancomycin (VANCOCIN) IVPB 1000 mg/200 mL premix (not administered)  acetaminophen (TYLENOL) tablet 650 mg (650 mg Oral Given 01/01/17 2252)     Initial Impression / Assessment and Plan / ED Course  I have reviewed the triage vital signs and the nursing notes.  Pertinent labs & imaging results that were available during my care of the patient were reviewed by me and considered in my medical decision making (see chart for details).    Patient presents with shortness of breath, cough and fever.  Code sepsis  activated on arrival.  He is febrile with tachypnea and tachycardia and increased work of breathing.  Patient given nebulizers and steroids.  Antipyretics.  ABG does not show significant CO2 retention.  Poor air exchange with minimal wheezing.  X-ray does not show any obvious infiltrate.  Patient does appear dry will be given additional IV fluids.  Blood cultures obtained and antibiotics started.  ABG does not show significant CO2 retention.  Patient placed on BiPAP for increased work of breathing and repeat ABG obtained given his increasing somnolence. PH 7.4, PCO2 55.  Patient did take his melatonin and pain medication prior to coming to the ED.  Patient treated for sepsis and possible clinical pneumonia.  He was given broad-spectrum antibiotics after blood cultures obtained.  IV fluids given judiciously.  CT will be obtained to rule out pulmonary embolism given his history.  Admission to stepdown unit discussed with Dr. Maudie Mercury.  CRITICAL CARE Performed by: Ezequiel Essex Total critical care time: 45 minutes Critical care time was exclusive of separately billable procedures and treating other patients. Critical care was necessary to treat or prevent imminent or life-threatening deterioration. Critical care was time spent personally by me on the following activities: development of treatment plan with patient and/or surrogate as well as nursing, discussions with consultants, evaluation of patient's response to treatment, examination of patient, obtaining history from patient or surrogate, ordering and performing treatments and interventions, ordering and review of laboratory studies, ordering and review of radiographic studies, pulse oximetry and re-evaluation of patient's condition.  Final Clinical Impressions(s) / ED Diagnoses   Final diagnoses:  Sepsis, due to unspecified organism [facility])  COPD exacerbation ([place])  Not necessarily looks decent patient is 44 he is a healthy guy otherwise  seem to get his fever down given couple liters of fluid he is on antibiotics to be given a shot of Decadron and I do not think it is so needs to come looks okay heart rate down  New Prescriptions New Prescriptions   No medications on file     Ezequiel Essex, MD 01/02/17 [phone]

## 2017-01-02 ENCOUNTER — Encounter (HOSPITAL_COMMUNITY): Payer: Self-pay | Admitting: Internal Medicine

## 2017-01-02 ENCOUNTER — Other Ambulatory Visit: Payer: Self-pay | Admitting: Family Medicine

## 2017-01-02 DIAGNOSIS — K5903 Drug induced constipation: Secondary | ICD-10-CM | POA: Diagnosis not present

## 2017-01-02 DIAGNOSIS — J9622 Acute and chronic respiratory failure with hypercapnia: Secondary | ICD-10-CM | POA: Diagnosis not present

## 2017-01-02 DIAGNOSIS — Z9484 Stem cells transplant status: Secondary | ICD-10-CM | POA: Diagnosis not present

## 2017-01-02 DIAGNOSIS — Z9481 Bone marrow transplant status: Secondary | ICD-10-CM | POA: Diagnosis not present

## 2017-01-02 DIAGNOSIS — I5032 Chronic diastolic (congestive) heart failure: Secondary | ICD-10-CM | POA: Diagnosis not present

## 2017-01-02 DIAGNOSIS — E119 Type 2 diabetes mellitus without complications: Secondary | ICD-10-CM | POA: Diagnosis not present

## 2017-01-02 DIAGNOSIS — D638 Anemia in other chronic diseases classified elsewhere: Secondary | ICD-10-CM | POA: Diagnosis not present

## 2017-01-02 DIAGNOSIS — I110 Hypertensive heart disease with heart failure: Secondary | ICD-10-CM | POA: Diagnosis not present

## 2017-01-02 DIAGNOSIS — M1990 Unspecified osteoarthritis, unspecified site: Secondary | ICD-10-CM | POA: Diagnosis present

## 2017-01-02 DIAGNOSIS — Z886 Allergy status to analgesic agent status: Secondary | ICD-10-CM | POA: Diagnosis not present

## 2017-01-02 DIAGNOSIS — F1120 Opioid dependence, uncomplicated: Secondary | ICD-10-CM | POA: Diagnosis present

## 2017-01-02 DIAGNOSIS — A419 Sepsis, unspecified organism: Secondary | ICD-10-CM | POA: Diagnosis not present

## 2017-01-02 DIAGNOSIS — Z7952 Long term (current) use of systemic steroids: Secondary | ICD-10-CM | POA: Diagnosis not present

## 2017-01-02 DIAGNOSIS — G8929 Other chronic pain: Secondary | ICD-10-CM | POA: Diagnosis not present

## 2017-01-02 DIAGNOSIS — R0602 Shortness of breath: Secondary | ICD-10-CM | POA: Diagnosis not present

## 2017-01-02 DIAGNOSIS — K219 Gastro-esophageal reflux disease without esophagitis: Secondary | ICD-10-CM | POA: Diagnosis not present

## 2017-01-02 DIAGNOSIS — J9601 Acute respiratory failure with hypoxia: Secondary | ICD-10-CM | POA: Diagnosis not present

## 2017-01-02 DIAGNOSIS — Z8709 Personal history of other diseases of the respiratory system: Secondary | ICD-10-CM | POA: Diagnosis not present

## 2017-01-02 DIAGNOSIS — R000 Tachycardia, unspecified: Secondary | ICD-10-CM | POA: Diagnosis not present

## 2017-01-02 DIAGNOSIS — C9101 Acute lymphoblastic leukemia, in remission: Secondary | ICD-10-CM | POA: Diagnosis not present

## 2017-01-02 DIAGNOSIS — Z86718 Personal history of other venous thrombosis and embolism: Secondary | ICD-10-CM | POA: Diagnosis not present

## 2017-01-02 DIAGNOSIS — J9621 Acute and chronic respiratory failure with hypoxia: Secondary | ICD-10-CM | POA: Diagnosis not present

## 2017-01-02 DIAGNOSIS — N289 Disorder of kidney and ureter, unspecified: Secondary | ICD-10-CM | POA: Diagnosis present

## 2017-01-02 DIAGNOSIS — J441 Chronic obstructive pulmonary disease with (acute) exacerbation: Secondary | ICD-10-CM | POA: Diagnosis not present

## 2017-01-02 DIAGNOSIS — Z515 Encounter for palliative care: Secondary | ICD-10-CM | POA: Diagnosis not present

## 2017-01-02 DIAGNOSIS — G894 Chronic pain syndrome: Secondary | ICD-10-CM | POA: Diagnosis not present

## 2017-01-02 DIAGNOSIS — Z9981 Dependence on supplemental oxygen: Secondary | ICD-10-CM | POA: Diagnosis not present

## 2017-01-02 DIAGNOSIS — D801 Nonfamilial hypogammaglobulinemia: Secondary | ICD-10-CM | POA: Diagnosis not present

## 2017-01-02 DIAGNOSIS — R400 Somnolence: Secondary | ICD-10-CM | POA: Diagnosis present

## 2017-01-02 DIAGNOSIS — Z87891 Personal history of nicotine dependence: Secondary | ICD-10-CM | POA: Diagnosis not present

## 2017-01-02 DIAGNOSIS — F419 Anxiety disorder, unspecified: Secondary | ICD-10-CM | POA: Diagnosis present

## 2017-01-02 DIAGNOSIS — Z856 Personal history of leukemia: Secondary | ICD-10-CM | POA: Diagnosis not present

## 2017-01-02 DIAGNOSIS — Z7951 Long term (current) use of inhaled steroids: Secondary | ICD-10-CM | POA: Diagnosis not present

## 2017-01-02 DIAGNOSIS — J189 Pneumonia, unspecified organism: Secondary | ICD-10-CM | POA: Diagnosis not present

## 2017-01-02 DIAGNOSIS — I82511 Chronic embolism and thrombosis of right femoral vein: Secondary | ICD-10-CM | POA: Diagnosis not present

## 2017-01-02 DIAGNOSIS — J069 Acute upper respiratory infection, unspecified: Secondary | ICD-10-CM | POA: Diagnosis not present

## 2017-01-02 DIAGNOSIS — B348 Other viral infections of unspecified site: Secondary | ICD-10-CM | POA: Diagnosis not present

## 2017-01-02 DIAGNOSIS — R509 Fever, unspecified: Secondary | ICD-10-CM | POA: Diagnosis present

## 2017-01-02 DIAGNOSIS — R9431 Abnormal electrocardiogram [ECG] [EKG]: Secondary | ICD-10-CM | POA: Diagnosis not present

## 2017-01-02 DIAGNOSIS — D696 Thrombocytopenia, unspecified: Secondary | ICD-10-CM | POA: Diagnosis not present

## 2017-01-02 DIAGNOSIS — R0600 Dyspnea, unspecified: Secondary | ICD-10-CM | POA: Diagnosis not present

## 2017-01-02 DIAGNOSIS — C9100 Acute lymphoblastic leukemia not having achieved remission: Secondary | ICD-10-CM | POA: Diagnosis not present

## 2017-01-02 DIAGNOSIS — D89813 Graft-versus-host disease, unspecified: Secondary | ICD-10-CM | POA: Diagnosis not present

## 2017-01-02 DIAGNOSIS — I5030 Unspecified diastolic (congestive) heart failure: Secondary | ICD-10-CM | POA: Diagnosis not present

## 2017-01-02 DIAGNOSIS — Z79899 Other long term (current) drug therapy: Secondary | ICD-10-CM | POA: Diagnosis not present

## 2017-01-02 DIAGNOSIS — Z7984 Long term (current) use of oral hypoglycemic drugs: Secondary | ICD-10-CM | POA: Diagnosis not present

## 2017-01-02 DIAGNOSIS — I2699 Other pulmonary embolism without acute cor pulmonale: Secondary | ICD-10-CM | POA: Diagnosis not present

## 2017-01-02 DIAGNOSIS — R0902 Hypoxemia: Secondary | ICD-10-CM | POA: Diagnosis not present

## 2017-01-02 DIAGNOSIS — D649 Anemia, unspecified: Secondary | ICD-10-CM | POA: Diagnosis not present

## 2017-01-02 DIAGNOSIS — D539 Nutritional anemia, unspecified: Secondary | ICD-10-CM | POA: Diagnosis not present

## 2017-01-02 DIAGNOSIS — R69 Illness, unspecified: Secondary | ICD-10-CM | POA: Diagnosis not present

## 2017-01-02 DIAGNOSIS — I509 Heart failure, unspecified: Secondary | ICD-10-CM | POA: Diagnosis not present

## 2017-01-02 DIAGNOSIS — Z8572 Personal history of non-Hodgkin lymphomas: Secondary | ICD-10-CM | POA: Diagnosis not present

## 2017-01-02 DIAGNOSIS — D89811 Chronic graft-versus-host disease: Secondary | ICD-10-CM | POA: Diagnosis not present

## 2017-01-02 DIAGNOSIS — T402X5A Adverse effect of other opioids, initial encounter: Secondary | ICD-10-CM | POA: Diagnosis not present

## 2017-01-02 DIAGNOSIS — I5020 Unspecified systolic (congestive) heart failure: Secondary | ICD-10-CM | POA: Diagnosis not present

## 2017-01-02 DIAGNOSIS — I2782 Chronic pulmonary embolism: Secondary | ICD-10-CM | POA: Diagnosis not present

## 2017-01-02 LAB — EXPECTORATED SPUTUM ASSESSMENT W REFEX TO RESP CULTURE

## 2017-01-02 LAB — I-STAT ARTERIAL BLOOD GAS, ED
Acid-Base Excess: 8 mmol/L — ABNORMAL HIGH (ref 0.0–2.0)
Bicarbonate: 34.4 mmol/L — ABNORMAL HIGH (ref 20.0–28.0)
O2 SAT: 99 %
PO2 ART: 135 mmHg — AB (ref 83.0–108.0)
TCO2: 36 mmol/L — ABNORMAL HIGH (ref 22–32)
pCO2 arterial: 60.7 mmHg — ABNORMAL HIGH (ref 32.0–48.0)
pH, Arterial: 7.371 (ref 7.350–7.450)

## 2017-01-02 LAB — TROPONIN I: Troponin I: 0.04 ng/mL (ref <0.03)

## 2017-01-02 LAB — I-STAT CG4 LACTIC ACID, ED: Lactic Acid, Venous: 1.69 mmol/L (ref 0.5–1.9)

## 2017-01-02 LAB — INFLUENZA PANEL BY PCR (TYPE A & B)
INFLAPCR: NEGATIVE
INFLBPCR: NEGATIVE

## 2017-01-02 LAB — BRAIN NATRIURETIC PEPTIDE: B Natriuretic Peptide: 140.6 pg/mL — ABNORMAL HIGH (ref 0.0–100.0)

## 2017-01-02 LAB — D-DIMER, QUANTITATIVE (NOT AT ARMC): D DIMER QUANT: 4.18 ug{FEU}/mL — AB (ref 0.00–0.50)

## 2017-01-02 LAB — EXPECTORATED SPUTUM ASSESSMENT W GRAM STAIN, RFLX TO RESP C

## 2017-01-02 MED ORDER — ALBUTEROL (5 MG/ML) CONTINUOUS INHALATION SOLN: 15.0000 mg/h | INHALATION_SOLUTION | Freq: Once | RESPIRATORY_TRACT | Status: DC

## 2017-01-02 MED ORDER — PIPERACILLIN-TAZOBACTAM 3.375 G IVPB: 3.3750 g | Freq: Three times a day (TID) | INTRAVENOUS | Status: DC

## 2017-01-02 MED ORDER — METHYLPREDNISOLONE SODIUM SUCC 125 MG IJ SOLR
125.0000 mg | Freq: Once | INTRAMUSCULAR | Status: AC
  Administered 2017-01-02: 125 mg via INTRAVENOUS
  Filled 2017-01-02: qty 2

## 2017-01-02 MED ORDER — MAGNESIUM SULFATE 2 GM/50ML IV SOLN
2.0000 g | Freq: Once | INTRAVENOUS | Status: AC
  Administered 2017-01-02: 2 g via INTRAVENOUS
  Filled 2017-01-02: qty 50

## 2017-01-02 MED ORDER — SODIUM CHLORIDE 0.9% FLUSH: 10.0000 mL | INTRAVENOUS | Status: DC | PRN

## 2017-01-02 MED ORDER — ALBUTEROL SULFATE (2.5 MG/3ML) 0.083% IN NEBU
15.0000 mg | INHALATION_SOLUTION | Freq: Once | RESPIRATORY_TRACT | Status: AC
  Administered 2017-01-02: 15 mg via RESPIRATORY_TRACT

## 2017-01-02 MED ORDER — IOPAMIDOL (ISOVUE-370) INJECTION 76%
INTRAVENOUS | Status: AC
  Filled 2017-01-02: qty 100

## 2017-01-02 MED ORDER — VANCOMYCIN HCL 10 G IV SOLR: 1250.0000 mg | INTRAVENOUS | 0 refills | Status: DC

## 2017-01-02 MED ORDER — PIPERACILLIN-TAZOBACTAM 3.375 G IVPB: 3.3750 g | Freq: Three times a day (TID) | INTRAVENOUS | 0 refills | Status: DC

## 2017-01-02 MED ORDER — VANCOMYCIN HCL 10 G IV SOLR: 1250.0000 mg | INTRAVENOUS | Status: DC

## 2017-01-02 MED ORDER — IPRATROPIUM-ALBUTEROL 0.5-2.5 (3) MG/3ML IN SOLN
3.0000 mL | Freq: Once | RESPIRATORY_TRACT | Status: AC
  Administered 2017-01-02: 3 mL via RESPIRATORY_TRACT
  Filled 2017-01-02: qty 3

## 2017-01-02 MED ORDER — IPRATROPIUM BROMIDE 0.02 % IN SOLN
0.5000 mg | Freq: Once | RESPIRATORY_TRACT | Status: AC
  Administered 2017-01-02: 0.5 mg via RESPIRATORY_TRACT

## 2017-01-02 NOTE — Progress Notes (Signed)
Pharmacy Antibiotic Note  Derek Shepherd is a 61 y.o. male admitted on 01/01/2017 with cough/fever/SOB, possible sepsis .  Pharmacy has been consulted for Vancomycin and Zosyn  dosing. Plan: Vancomycin 1500 mg IV now, then Vancomycin 1250 mg IV q24h Zosyn 3.375 g IV q8h   Height: 5\' 6"  (167.6 cm) Weight: 194 lb (88 kg) IBW/kg (Calculated) : 63.8  Temp (24hrs), Avg:102.6 F (39.2 C), Min:102.6 F (39.2 C), Max:102.6 F (39.2 C)   Recent Labs Lab 01/01/17 2245 01/01/17 2305 01/02/17 0301  WBC 8.4  --   --   CREATININE 1.30*  --   --   LATICACIDVEN  --  2.00* 1.69    Estimated Creatinine Clearance: 63.6 mL/min (A) (by C-G formula based on SCr of 1.3 mg/dL (H)).    Allergies  Allergen Reactions  . Nsaids Shortness Of Breath    Caryl Pina 01/02/2017 4:24 AM

## 2017-01-02 NOTE — ED Notes (Signed)
ABG results given to MD Rancour

## 2017-01-02 NOTE — Progress Notes (Signed)
Pt is tolerating BiPAP well. SATs are stable

## 2017-01-02 NOTE — Progress Notes (Signed)
Reached out to [facility] since he has hx of bone marrow transplant and is closely followed there would be in a better position to treat his sepsis.  Dr. Blondell Reveal is the accepting physician

## 2017-01-02 NOTE — ED Notes (Signed)
ABG collected  

## 2017-01-02 NOTE — H&P (Signed)
TRH H&P   Patient Demographics:    Derek Shepherd, is a 61 y.o. male  MRN: [id number]   DOB - 08/20/1955  Admit Date - 01/01/2017  Outpatient Primary MD for the patient is Claretta Fraise, MD  Referring MD/NP/PA:   Charolotte Capuchin  Outpatient Specialists:  Royanne Foots (oncology) [place] (cardiology)  [place] (pain), [place]  Patient coming from: home  Chief Complaint  Patient presents with  . Cough  . Fever  . Shortness of Breath      HPI:    Derek Shepherd  is a 61 y.o. male, w hx of leukemia, Copd, CHF (30-35%), apparently c/o fever, cough w yellow sputum for the past 2 days and presented due to increase in dyspnea ??.    In ED,  Wbc 8.4, Hgb 10.4, Plt 83 Bun 15, Creatinine 1.3 Glucose 172 Lactic acid 2.0 Ua=> negative CXR   IMPRESSION: No acute abnormality.  Stable left basilar scarring.  BNP 140.6 Venous gas  Ph 7.482 Co2 58, O2 164  Influenza panel negative  Pt will be admitted for fever and sepsis of unclear etiology, possibly clinically pneumonia.     Review of systems:    In addition to the HPI above,  No Headache, No changes with Vision or hearing, No problems swallowing food or Liquids, No Chest pain,  No Abdominal pain, No Nausea or Vommitting, Bowel movements are regular, No Blood in stool or Urine, No dysuria, No new skin rashes or bruises, No new joints pains-aches,  No new weakness, tingling, numbness in any extremity, No recent weight gain or loss, No polyuria, polydypsia or polyphagia, No significant Mental Stressors.  A full 10 point Review of Systems was done, except as stated above, all other Review of Systems were negative.   With Past History of the following :    Past Medical History:  Diagnosis Date  . Adenomatous colon polyp    tubular  . Anemia   . Anxiety   .  Arthritis   . Asthma   . Bowel obstruction (HCC)    constipation  . CHF (congestive heart failure) ([place])   . COPD (chronic obstructive pulmonary disease) ([place])   . Depression   . Diverticulosis   . DVT (deep venous thrombosis) ([place])   . Gallstones   . GERD (gastroesophageal reflux disease)   . 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 History of bone marrow transplant ([place])   . Leukemia-lymphoma, T-cell, acute, HTLV-I-associated ([place])   . Personal history of colonic  adenoma 01/22/2008      Past Surgical History:  Procedure Laterality Date  . BONE MARROW TRANSPLANT  2011  . CHOLECYSTECTOMY    . COLONOSCOPY W/ BIOPSIES    . EXPLORATORY LAPAROTOMY    . HERNIA REPAIR     x 2  . LUNG SURGERY Left  Social History:     Social History  Substance Use Topics  . Smoking status: Former Smoker    Types: Cigarettes    Quit date: 07/13/1993  . Smokeless tobacco: Never Used  . Alcohol use 0.0 oz/week     Comment: socially     Lives - at home  Mobility -  Walks by self    Family History :     Family History  Problem Relation Age of Onset  . Clotting disorder Mother   . Heart attack Mother   . Diabetes Father   . Heart attack Father   . Prostate cancer Brother   . Diabetes Brother        x 3  . Diabetes Sister        x 3  . Diabetes Maternal Aunt       Home Medications:   Prior to Admission medications   Medication Sig Start Date End Date Taking? Authorizing Provider  acidophilus (RISAQUAD) CAPS capsule Take 1 capsule by mouth daily.    [provider]  citalopram (CELEXA) 40 MG tablet TAKE ONE TABLET BY MOUTH ONCE DAILY, REDUCE TO 1/2 TABLET ONCE DAILY WHILE TAKING FLUCONAZOLE FOR 2 WEEKS. 11/08/16   Claretta Fraise, MD  diphenoxylate-atropine (LOMOTIL) 2.5-0.025 MG tablet Take 2 tablets by mouth 4 (four) times daily as needed for diarrhea or loose stools. 05/23/16   Claretta Fraise, MD  fluconazole (DIFLUCAN)  100 MG tablet TAKE 1 TABLET BY MOUTH ONCE DAILY 12/23/16   Claretta Fraise, MD  fluticasone [facility]) 50 MCG/ACT nasal spray Place 1 spray into both nostrils daily. 02/04/16   Claretta Fraise, MD  folic acid (FOLVITE) 1 MG tablet TAKE 1 TABLET BY MOUTH ONCE DAILY 12/16/16   Claretta Fraise, MD  glucose blood test strip Use as instructed 07/07/16   Eustaquio Maize, MD  HYDROmorphone (DILAUDID) 4 MG tablet Take 4 mg by mouth every 4 (four) hours. 08/22/16   [provider]  lactulose (CHRONULAC) 10 GM/15ML solution Take 20 g by mouth 2 (two) times daily as needed for mild constipation.    [provider]  levalbuterol Penne Lash) 1.25 MG/3ML nebulizer solution USE ONE VIAL IN NEBULIZER 4 TIMES DAILY 08/19/16   Claretta Fraise, MD  levofloxacin (LEVAQUIN) 500 MG tablet Take 1 tablet (500 mg total) by mouth daily. 12/20/16   Claretta Fraise, MD  metFORMIN (GLUCOPHAGE) 500 MG tablet TAKE ONE TABLET BY MOUTH TWICE DAILY WITH A MEAL 06/13/16   Claretta Fraise, MD  methylPREDNISolone (MEDROL) 8 MG tablet TAKE 1 TABLET BY MOUTH ONCE DAILY 10/17/16   Claretta Fraise, MD  montelukast (SINGULAIR) 10 MG tablet TAKE ONE TABLET BY MOUTH ONCE DAILY 08/12/16   Claretta Fraise, MD  Multiple Vitamin (MULTIVITAMIN WITH MINERALS) TABS tablet Take 1 tablet by mouth daily.    [provider]  mycophenolate (CELLCEPT) 500 MG tablet Take 500 mg by mouth. 05/10/16   [provider]  nefazodone (SERZONE) 50 MG tablet TAKE ONE TABLET BY MOUTH AT BEDTIME (NO LONGER TAKING ELIQUIS) 10/10/16   Claretta Fraise, MD  pantoprazole (PROTONIX) 40 MG tablet Take 1 tablet (40 mg total) by mouth daily. 12/20/16   Claretta Fraise, MD  Polyethyl Glycol-Propyl Glycol (SYSTANE) 0.4-0.3 % SOLN Place 1 drop into both eyes as needed (for dry eyes).    [provider]  potassium chloride (MICRO-K) 10 MEQ CR capsule TAKE THREE CAPSULES BY MOUTH TWICE DAILY 08/30/16   Claretta Fraise, MD  rOPINIRole (REQUIP) 3 MG tablet TAKE 1  TABLET BY MOUTH AT BEDTIME 09/20/16   Claretta Fraise, MD  senna (SENOKOT) 8.6 MG tablet Take 2 tablets by mouth daily as needed for constipation.    [provider]  Sirolimus 0.5 MG TABS TAKE ONE TABLET BY MOUTH ONCE DAILY 06/14/16   Claretta Fraise, MD  SYMBICORT 160-4.5 MCG/ACT inhaler INHALE TWO PUFFS BY MOUTH ONCE DAILY IN THE MORNING 08/19/16   Claretta Fraise, MD  torsemide (DEMADEX) 100 MG tablet Take 1 tablet (100 mg total) by mouth daily. Patient taking differently: Take 50 mg by mouth daily.  03/06/16 03/06/17  Isaac Bliss, Rayford Halsted, MD  umeclidinium bromide (INCRUSE ELLIPTA) 62.5 MCG/INH AEPB Inhale 1 puff into the lungs daily. 06/07/16   Claretta Fraise, MD  vitamin B-12 (CYANOCOBALAMIN) 1000 MCG tablet Take 1,000 mcg by mouth daily.     [provider]     Allergies:     Allergies  Allergen Reactions  . Nsaids Shortness Of Breath     Physical Exam:   Vitals  Blood pressure 120/68, pulse 95, temperature (!) 102.6 F (39.2 C), temperature source Oral, resp. rate (!) 21, height 5\' 6"  (1.676 m), weight 88 kg (194 lb), SpO2 99 %.   1. General  lying in bed in NAD,   2. Normal affect and insight, Not Suicidal or Homicidal, Awake Alert, Oriented X 3.  3. No F.N deficits, ALL C.Nerves Intact, Strength 5/5 all 4 extremities, Sensation intact all 4 extremities, Plantars down going.  4. Ears and Eyes appear Normal, Conjunctivae clear, PERRLA. Moist Oral Mucosa.  5. Supple Neck, No JVD, No cervical lymphadenopathy appriciated, No Carotid Bruits.  6. Symmetrical Chest wall movement, Slight crackles bilateral base, no wheezing  7. RRR, No Gallops, Rubs or Murmurs, No Parasternal Heave.  8. Positive Bowel Sounds, Abdomen Soft, No tenderness, No organomegaly appriciated,No rebound -guarding or rigidity.  9.  No Cyanosis, Normal Skin Turgor, No Skin Rash or Bruise.  10. Good muscle tone,  joints appear normal , no effusions, Normal ROM.  11. No Palpable  Lymph Nodes in Neck or Axillae  portacath right upper chest, no erythema   Data Review:    CBC  Recent Labs Lab 01/01/17 2245  WBC 8.4  HGB 10.4*  HCT 33.5*  PLT 83*  MCV 105.3*  MCH 32.7  MCHC 31.0  RDW 14.5  LYMPHSABS 0.9  MONOABS 0.6  EOSABS 0.0  BASOSABS 0.0   ------------------------------------------------------------------------------------------------------------------  Chemistries   Recent Labs Lab 01/01/17 2245  NA 139  K 4.0  CL 91*  CO2 36*  GLUCOSE 172*  BUN 15  CREATININE 1.30*  CALCIUM 9.7  AST 35  ALT 23  ALKPHOS 70  BILITOT 0.4   ------------------------------------------------------------------------------------------------------------------ estimated creatinine clearance is 63.6 mL/min (A) (by C-G formula based on SCr of 1.3 mg/dL (H)). ------------------------------------------------------------------------------------------------------------------ No results for input(s): TSH, T4TOTAL, T3FREE, THYROIDAB in the last 72 hours.  Invalid input(s): FREET3  Coagulation profile  Recent Labs Lab 01/01/17 2245  INR 0.95   ------------------------------------------------------------------------------------------------------------------- No results for input(s): DDIMER in the last 72 hours. -------------------------------------------------------------------------------------------------------------------  Cardiac Enzymes No results for input(s): CKMB, TROPONINI, MYOGLOBIN in the last 168 hours.  Invalid input(s): CK ------------------------------------------------------------------------------------------------------------------    Component Value Date/Time   BNP 140.6 (H) 01/01/2017 2342     ---------------------------------------------------------------------------------------------------------------  Urinalysis    Component Value Date/Time   COLORURINE YELLOW 01/01/2017 2252   APPEARANCEUR CLEAR 01/01/2017 2252    APPEARANCEUR Clear 05/31/2016 1431   LABSPEC 1.011 01/01/2017 2252   PHURINE 7.0  01/01/2017 2252   GLUCOSEU NEGATIVE 01/01/2017 2252   HGBUR NEGATIVE 01/01/2017 2252   BILIRUBINUR NEGATIVE 01/01/2017 2252   BILIRUBINUR Negative 05/31/2016 1431   KETONESUR NEGATIVE 01/01/2017 2252   PROTEINUR 100 (A) 01/01/2017 2252   UROBILINOGEN 1.0 07/18/2009 1936   NITRITE NEGATIVE 01/01/2017 2252   LEUKOCYTESUR NEGATIVE 01/01/2017 2252   LEUKOCYTESUR Negative 05/31/2016 1431    ----------------------------------------------------------------------------------------------------------------   Imaging Results:    Dg Chest Portable 1 View  Result Date: 01/01/2017 CLINICAL DATA:  Shortness of breath for 5 days. EXAM: PORTABLE CHEST 1 VIEW COMPARISON:  Radiograph 12/13/2016, additional priors. Most recent chest CT 02/25/2016 FINDINGS: Tip of the right chest port in the distal SVC, unchanged. Unchanged heart size and mediastinal contours allowing for differences in technique. No pulmonary edema. Left basilar scarring is unchanged. No confluent consolidation, large pleural effusion or pneumothorax. Postsurgical change involving the left ribs. IMPRESSION: No acute abnormality.  Stable left basilar scarring. Electronically Signed   By: Jeb Levering M.D.   On: 01/01/2017 23:55      Assessment & Plan:    Active Problems:   Sepsis ([place])  Fever Sepsis (fever, tachycarcia, Lactic acidosis) Blood culture x2 Start vanco, zosyn  Iv pharmacy to dose  Tachycardia Tele Trop I q6h x3 D dimer If positive then CTA chest r/o PE  Renal insufficiency  Hydrate with ns iv gently  Check cmp in am  Anemia/ thrombocytopenia Repeat cbc in am  Copd with Co2 retention Continue symbicort, xopenex Cont methylprednisolone Cont incruse Continue Bipap per respiratory protocol  Opioid dep Hold dilaudid this am due to somnolence, can restart when more awake  Dm2 HOLD Metformin due to lactic acidosis and mild  renal insufficiency fsbs ac and qhs ISS ' DVT Prophylaxis   SCDs  AM Labs Ordered, also please review Full Orders  Family Communication: Admission, patients condition and plan of care including tests being ordered have been discussed with the patient  who indicate understanding and agree with the plan and Code Status.  Code Status FULL CODE  Likely DC to  home  Condition GUARDED    Consults called: none  Admission status: inpatient   Time spent in minutes : 45    Jani Gravel M.D on 01/02/2017 at 2:53 AM  Between 7am to 7pm - Pager - [phone]  . After 7pm go to [email or web address] - password [email or web address]  [facility] - Office  [phone]

## 2017-01-02 NOTE — Progress Notes (Signed)
Pt is on NIV at this time due to respiratory distress. Accessory muscle noted. RT will continue to monitor.

## 2017-01-02 NOTE — Discharge Summary (Signed)
Derek Shepherd, is a 61 y.o. male  DOB 07-17-1955  MRN [id number].  Admission date:  01/01/2017  Admitting Physician  No admitting provider for patient encounter.  Discharge Date:  01/02/2017   Primary MD  Claretta Fraise, MD  Recommendations for primary care physician for things to follow:   Fever Sepsis (fever, tachycarcia, Lactic acidosis) Blood culture x2 draw, results pending Start vanco, zosyn  Iv pharmacy to dose  Tachycardia Tele Trop I q6h x3 D dimer positive, please order CTA r/o PE< and better assess if pneumonia present  Renal insufficiency  Hydrate with ns iv gently  Check cmp in am  Anemia/ thrombocytopenia Repeat cbc in am  Copd with Co2 retention Continue symbicort, xopenex Cont methylprednisolone Cont incruse Continue Bipap per respiratory protocol  Opioid dep Hold dilaudid this am due to somnolence, can restart when more awake  Dm2 HOLD Metformin due to lactic acidosis and mild renal insufficiency fsbs ac and qhs ISS ' DVT Prophylaxis   SCDs  AM Labs Ordered, also please review Full Orders  Family Communication: Admission, patients condition and plan of care including tests being ordered have been discussed with the patient  who indicate understanding and agree with the plan and Code Status.  FULL CODE   Admission Diagnosis  sob   Discharge Diagnosis  sob      Principal Problem:   Sepsis ([place]) Active Problems:   Anemia of chronic disease   Thrombocytopenia (HCC)   Controlled type 2 diabetes mellitus without complication, without long-term current use of insulin (HCC)   Fever      Past Medical History:  Diagnosis Date  . Adenomatous colon polyp    tubular  . Anemia   . Anxiety   . Arthritis   . Asthma   . Bowel obstruction (HCC)    constipation  . CHF (congestive heart failure) ([place])   . COPD (chronic obstructive pulmonary  disease) ([place])   . Depression   . Diverticulosis   . DVT (deep venous thrombosis) ([place])   . Gallstones   . GERD (gastroesophageal reflux disease)   . 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 History of bone marrow transplant ([place])   . Leukemia-lymphoma, T-cell, acute, HTLV-I-associated ([place])   . Personal history of colonic  adenoma 01/22/2008    Past Surgical History:  Procedure Laterality Date  . BONE MARROW TRANSPLANT  2011  . CHOLECYSTECTOMY    . COLONOSCOPY W/ BIOPSIES    . EXPLORATORY LAPAROTOMY    . HERNIA REPAIR     x 2  . LUNG SURGERY Left        HPI  from the history and physical done on the day of admission:      61 y.o. male, w hx of leukemia, Copd, CHF (30-35%), apparently c/o fever, cough w yellow sputum for the past 2 days and presented due to increase in dyspnea ??.    In ED,  Wbc 8.4, Hgb 10.4, Plt 83 Bun  15, Creatinine 1.3 Glucose 172 Lactic acid 2.0 Ua=> negative CXR   IMPRESSION: No acute abnormality. Stable left basilar scarring.  BNP 140.6 Venous gas  Ph 7.482 Co2 58, O2 164  Influenza panel negative  Pt will be admitted for fever and sepsis of unclear etiology, possibly clinically pneumonia.     Hospital Course:     Pt was observed in the ED,  I personally contacted [facility] since his oncologist is there and this is a complex patient with history of being on immunosuppresive medication due to bone marrow transplant and the patient was accepted to [facility] for sepsis of unclear source, probably pulmonary related (ie pneumonia).  Dr. Blondell Reveal kindly accepted the patient.     Discharge Condition: stable  Diet and Activity recommendation: See Discharge Instructions below  Discharge Instructions      Discharge Medications     Allergies as of 01/02/2017      Reactions   Nsaids Shortness Of Breath      Medication List    STOP taking these medications   levofloxacin 500 MG  tablet Commonly known as:  LEVAQUIN     TAKE these medications   acidophilus Caps capsule Take 1 capsule by mouth daily.   citalopram 40 MG tablet Commonly known as:  CELEXA TAKE ONE TABLET BY MOUTH ONCE DAILY, REDUCE TO 1/2 TABLET ONCE DAILY WHILE TAKING FLUCONAZOLE FOR 2 WEEKS.   diphenoxylate-atropine 2.5-0.025 MG tablet Commonly known as:  LOMOTIL Take 2 tablets by mouth 4 (four) times daily as needed for diarrhea or loose stools.   fluconazole 100 MG tablet Commonly known as:  DIFLUCAN TAKE 1 TABLET BY MOUTH ONCE DAILY   fluticasone 50 MCG/ACT nasal spray Commonly known as:  FLONASE Place 1 spray into both nostrils daily.   folic acid 1 MG tablet Commonly known as:  FOLVITE TAKE 1 TABLET BY MOUTH ONCE DAILY   glucose blood test strip Use as instructed   HYDROmorphone 4 MG tablet Commonly known as:  DILAUDID Take 4 mg by mouth every 4 (four) hours.   lactulose 10 GM/15ML solution Commonly known as:  CHRONULAC Take 20 g by mouth 2 (two) times daily as needed for mild constipation.   levalbuterol 1.25 MG/3ML nebulizer solution Commonly known as:  XOPENEX USE ONE VIAL IN NEBULIZER 4 TIMES DAILY   metFORMIN 500 MG tablet Commonly known as:  GLUCOPHAGE TAKE ONE TABLET BY MOUTH TWICE DAILY WITH A MEAL   methylPREDNISolone 8 MG tablet Commonly known as:  MEDROL TAKE 1 TABLET BY MOUTH ONCE DAILY   montelukast 10 MG tablet Commonly known as:  SINGULAIR TAKE ONE TABLET BY MOUTH ONCE DAILY   multivitamin with minerals Tabs tablet Take 1 tablet by mouth daily.   mycophenolate 500 MG tablet Commonly known as:  CELLCEPT Take 500 mg by mouth.   nefazodone 50 MG tablet Commonly known as:  SERZONE TAKE ONE TABLET BY MOUTH AT BEDTIME (NO LONGER TAKING ELIQUIS)   pantoprazole 40 MG tablet Commonly known as:  PROTONIX Take 1 tablet (40 mg total) by mouth daily.   piperacillin-tazobactam 3.375 GM/50ML IVPB Commonly known as:  ZOSYN Inject 50 mLs (3.375 g total)  into the vein every 8 (eight) hours.   potassium chloride 10 MEQ CR capsule Commonly known as:  MICRO-K TAKE THREE CAPSULES BY MOUTH TWICE DAILY   rOPINIRole 3 MG tablet Commonly known as:  REQUIP TAKE 1 TABLET BY MOUTH AT BEDTIME   senna 8.6 MG tablet Commonly known as:  [place] [employer]  Take 2 tablets by mouth daily as needed for constipation.   Sirolimus 0.5 MG tablet Commonly known as:  RAPAMUNE TAKE ONE TABLET BY MOUTH ONCE DAILY   SYMBICORT 160-4.5 MCG/ACT inhaler Generic drug:  budesonide-formoterol INHALE TWO PUFFS BY MOUTH ONCE DAILY IN THE MORNING   SYSTANE 0.4-0.3 % Soln Generic drug:  Polyethyl Glycol-Propyl Glycol Place 1 drop into both eyes as needed (for dry eyes).   torsemide 100 MG tablet Commonly known as:  DEMADEX Take 1 tablet (100 mg total) by mouth daily. What changed:  how much to take   umeclidinium bromide 62.5 MCG/INH Aepb Commonly known as:  INCRUSE ELLIPTA Inhale 1 puff into the lungs daily.   vancomycin 1,250 mg in sodium chloride 0.9 % 250 mL Inject 1,250 mg into the vein daily.   vitamin B-12 1000 MCG tablet Commonly known as:  CYANOCOBALAMIN Take 1,000 mcg by mouth daily.       Major procedures and Radiology Reports - PLEASE review detailed and final reports for all details, in brief -      Dg Chest 2 View  Result Date: 12/13/2016 CLINICAL DATA:  Dyspnea. EXAM: CHEST  2 VIEW COMPARISON:  Radiographs of May 31, 2016. FINDINGS: The heart size and mediastinal contours are within normal limits. No pneumothorax or pleural effusion is noted. Stable position of right internal jugular Port-A-Cath. Right lung is clear. Stable left basilar scarring or atelectasis is noted in left lung base. Postsurgical changes involving the left ribs are again noted and stable. IMPRESSION: Stable left basilar scarring or subsegmental atelectasis. Electronically Signed   By: Marijo Conception, M.D.   On: 12/13/2016 15:57   Dg Chest Portable 1 View  Result Date:  01/01/2017 CLINICAL DATA:  Shortness of breath for 5 days. EXAM: PORTABLE CHEST 1 VIEW COMPARISON:  Radiograph 12/13/2016, additional priors. Most recent chest CT 02/25/2016 FINDINGS: Tip of the right chest port in the distal SVC, unchanged. Unchanged heart size and mediastinal contours allowing for differences in technique. No pulmonary edema. Left basilar scarring is unchanged. No confluent consolidation, large pleural effusion or pneumothorax. Postsurgical change involving the left ribs. IMPRESSION: No acute abnormality.  Stable left basilar scarring. Electronically Signed   By: Jeb Levering M.D.   On: 01/01/2017 23:55    Micro Results     No results found for this or any previous visit (from the past 240 hour(s)).     Today   Subjective    Jantz Main today is currently on bipap, and fever improving.  Pt received vanco 1250mg  iv x1 and zosyn 3.375gm iv x1 in the ED, after blood cultures draw.  He has been accepted to [facility] and will be transferred there for further evaluation of fever, ? Sepsis    Objective   Blood pressure 124/73, pulse 80, temperature 98.8 F (37.1 C), temperature source Oral, resp. rate 19, height 5\' 6"  (1.676 m), weight 88 kg (194 lb), SpO2 99 %.   Intake/Output Summary (Last 24 hours) at 01/02/17 0534 Last data filed at 01/02/17 0023  Gross per 24 hour  Intake             1050 ml  Output                0 ml  Net             1050 ml    Exam Awake Alert, Oriented x 3, No new F.N deficits, Normal affect [place].AT,PERRAL Supple Neck,No JVD, No cervical lymphadenopathy appriciated.  Symmetrical Chest wall movement, Good air movement bilaterally, slight crackle bilateral base, no wheezing RRR,No Gallops,Rubs or new Murmurs, No Parasternal Heave +ve B.Sounds, Abd Soft, Non tender, No organomegaly appriciated, No rebound -guarding or rigidity. No Cyanosis, Clubbing or edema, No new Rash or bruise   Data Review   CBC w Diff: Lab Results  Component  Value Date   WBC 8.4 01/01/2017   HGB 10.4 (L) 01/01/2017   HGB 10.3 (L) 12/20/2016   HCT 33.5 (L) 01/01/2017   HCT 30.2 (L) 12/20/2016   PLT 83 (L) 01/01/2017   PLT 148 (L) 12/20/2016   LYMPHOPCT 10 01/01/2017   MONOPCT 7 01/01/2017   EOSPCT 0 01/01/2017   BASOPCT 0 01/01/2017    CMP: Lab Results  Component Value Date   NA 139 01/01/2017   NA 144 12/13/2016   K 4.0 01/01/2017   CL 91 (L) 01/01/2017   CO2 36 (H) 01/01/2017   BUN 15 01/01/2017   BUN 16 12/13/2016   CREATININE 1.30 (H) 01/01/2017   PROT 7.3 01/01/2017   PROT 6.3 12/13/2016   ALBUMIN 3.7 01/01/2017   ALBUMIN 4.1 12/13/2016   BILITOT 0.4 01/01/2017   BILITOT 0.2 12/13/2016   ALKPHOS 70 01/01/2017   AST 35 01/01/2017   ALT 23 01/01/2017  .   Total Time in preparing paper work, data evaluation and todays exam - 27 minutes  Jani Gravel M.D on 01/02/2017 at 5:34 AM  [facility]   Office  [phone]

## 2017-01-02 NOTE — Progress Notes (Signed)
Expectorated sputum culture sent to the lab.

## 2017-01-03 LAB — URINE CULTURE: Culture: NO GROWTH

## 2017-01-04 LAB — CULTURE, RESPIRATORY W GRAM STAIN: Culture: NORMAL

## 2017-01-04 LAB — CULTURE, RESPIRATORY

## 2017-01-07 LAB — CULTURE, BLOOD (ROUTINE X 2)
CULTURE: NO GROWTH
Culture: NO GROWTH

## 2017-01-10 MED ORDER — TIOTROPIUM BROMIDE MONOHYDRATE 18 MCG IN CAPS
1.00 | ORAL_CAPSULE | RESPIRATORY_TRACT | Status: DC
Start: 2017-01-11 — End: 2017-01-10

## 2017-01-10 MED ORDER — INSULIN LISPRO 100 UNIT/ML ~~LOC~~ SOLN
2.00 | SUBCUTANEOUS | Status: DC
Start: 2017-01-10 — End: 2017-01-10

## 2017-01-10 MED ORDER — SULFAMETHOXAZOLE-TRIMETHOPRIM 800-160 MG PO TABS
1.00 | ORAL_TABLET | ORAL | Status: DC
Start: 2017-01-11 — End: 2017-01-10

## 2017-01-10 MED ORDER — GENERIC EXTERNAL MEDICATION
2.00 | Status: DC
Start: ? — End: 2017-01-10

## 2017-01-10 MED ORDER — CITALOPRAM HYDROBROMIDE 20 MG PO TABS
40.00 mg | ORAL_TABLET | ORAL | Status: DC
Start: 2017-01-11 — End: 2017-01-10

## 2017-01-10 MED ORDER — FOLIC ACID 1 MG PO TABS
1.00 mg | ORAL_TABLET | ORAL | Status: DC
Start: 2017-01-11 — End: 2017-01-10

## 2017-01-10 MED ORDER — HYDROMORPHONE HCL 2 MG PO TABS
2.00 mg | ORAL_TABLET | ORAL | Status: DC
Start: ? — End: 2017-01-10

## 2017-01-10 MED ORDER — TORSEMIDE 10 MG PO TABS
50.00 mg | ORAL_TABLET | ORAL | Status: DC
Start: 2017-01-11 — End: 2017-01-10

## 2017-01-10 MED ORDER — PANTOPRAZOLE SODIUM 40 MG PO TBEC
40.00 mg | DELAYED_RELEASE_TABLET | ORAL | Status: DC
Start: 2017-01-10 — End: 2017-01-10

## 2017-01-10 MED ORDER — TRIAMCINOLONE ACETONIDE 0.1 % EX OINT
TOPICAL_OINTMENT | CUTANEOUS | Status: DC
Start: 2017-01-10 — End: 2017-01-10

## 2017-01-10 MED ORDER — GENERIC EXTERNAL MEDICATION
2.00 | Status: DC
Start: 2017-01-10 — End: 2017-01-10

## 2017-01-10 MED ORDER — METOPROLOL TARTRATE 25 MG PO TABS
12.50 mg | ORAL_TABLET | ORAL | Status: DC
Start: 2017-01-10 — End: 2017-01-10

## 2017-01-10 MED ORDER — HYDROXYZINE HCL 25 MG PO TABS
25.00 mg | ORAL_TABLET | ORAL | Status: DC
Start: ? — End: 2017-01-10

## 2017-01-10 MED ORDER — MELATONIN 3 MG PO TABS
3.00 mg | ORAL_TABLET | ORAL | Status: DC
Start: ? — End: 2017-01-10

## 2017-01-10 MED ORDER — DEXTROSE 10 % IV SOLN
125.00 mL | INTRAVENOUS | Status: DC
Start: ? — End: 2017-01-10

## 2017-01-10 MED ORDER — CLONAZEPAM 0.5 MG PO TABS
.25 mg | ORAL_TABLET | ORAL | Status: DC
Start: 2017-01-10 — End: 2017-01-10

## 2017-01-10 MED ORDER — ACYCLOVIR 400 MG PO TABS
400.00 mg | ORAL_TABLET | ORAL | Status: DC
Start: 2017-01-10 — End: 2017-01-10

## 2017-01-10 MED ORDER — SIROLIMUS 1 MG PO TABS
1.00 mg | ORAL_TABLET | ORAL | Status: DC
Start: 2017-01-11 — End: 2017-01-10

## 2017-01-10 MED ORDER — LOPERAMIDE HCL 2 MG PO CAPS
2.00 mg | ORAL_CAPSULE | ORAL | Status: DC
Start: ? — End: 2017-01-10

## 2017-01-10 MED ORDER — GENERIC EXTERNAL MEDICATION
1.00 | Status: DC
Start: ? — End: 2017-01-10

## 2017-01-10 MED ORDER — PREDNISONE 20 MG PO TABS
40.00 mg | ORAL_TABLET | ORAL | Status: DC
Start: 2017-01-11 — End: 2017-01-10

## 2017-01-10 MED ORDER — ALBUTEROL SULFATE (2.5 MG/3ML) 0.083% IN NEBU
2.50 mg | INHALATION_SOLUTION | RESPIRATORY_TRACT | Status: DC
Start: ? — End: 2017-01-10

## 2017-01-10 MED ORDER — ALBUTEROL SULFATE (2.5 MG/3ML) 0.083% IN NEBU
2.50 mg | INHALATION_SOLUTION | RESPIRATORY_TRACT | Status: DC
Start: 2017-01-10 — End: 2017-01-10

## 2017-01-10 MED ORDER — SENNOSIDES 8.6 MG PO TABS
17.20 mg | ORAL_TABLET | ORAL | Status: DC
Start: ? — End: 2017-01-10

## 2017-01-10 MED ORDER — ENOXAPARIN SODIUM 100 MG/ML ~~LOC~~ SOLN
90.00 mg | SUBCUTANEOUS | Status: DC
Start: 2017-01-10 — End: 2017-01-10

## 2017-01-10 MED ORDER — FLUCONAZOLE 200 MG PO TABS
400.00 mg | ORAL_TABLET | ORAL | Status: DC
Start: 2017-01-11 — End: 2017-01-10

## 2017-01-11 DIAGNOSIS — I110 Hypertensive heart disease with heart failure: Secondary | ICD-10-CM | POA: Diagnosis not present

## 2017-01-11 DIAGNOSIS — I5040 Unspecified combined systolic (congestive) and diastolic (congestive) heart failure: Secondary | ICD-10-CM | POA: Diagnosis not present

## 2017-01-11 DIAGNOSIS — J111 Influenza due to unidentified influenza virus with other respiratory manifestations: Secondary | ICD-10-CM | POA: Diagnosis not present

## 2017-01-11 DIAGNOSIS — J449 Chronic obstructive pulmonary disease, unspecified: Secondary | ICD-10-CM | POA: Diagnosis not present

## 2017-01-11 DIAGNOSIS — E119 Type 2 diabetes mellitus without complications: Secondary | ICD-10-CM | POA: Diagnosis not present

## 2017-01-12 DIAGNOSIS — D801 Nonfamilial hypogammaglobulinemia: Secondary | ICD-10-CM | POA: Diagnosis not present

## 2017-01-12 DIAGNOSIS — Z9484 Stem cells transplant status: Secondary | ICD-10-CM | POA: Diagnosis not present

## 2017-01-13 DIAGNOSIS — I5040 Unspecified combined systolic (congestive) and diastolic (congestive) heart failure: Secondary | ICD-10-CM | POA: Diagnosis not present

## 2017-01-13 DIAGNOSIS — I110 Hypertensive heart disease with heart failure: Secondary | ICD-10-CM | POA: Diagnosis not present

## 2017-01-13 DIAGNOSIS — J449 Chronic obstructive pulmonary disease, unspecified: Secondary | ICD-10-CM | POA: Diagnosis not present

## 2017-01-13 DIAGNOSIS — J111 Influenza due to unidentified influenza virus with other respiratory manifestations: Secondary | ICD-10-CM | POA: Diagnosis not present

## 2017-01-13 DIAGNOSIS — E119 Type 2 diabetes mellitus without complications: Secondary | ICD-10-CM | POA: Diagnosis not present

## 2017-01-16 ENCOUNTER — Telehealth: Payer: Self-pay | Admitting: *Deleted

## 2017-01-16 NOTE — Telephone Encounter (Signed)
[facility] nurse did med reconciliation Spiriva inhaler was prescribed when he was discharged Does he need to continue with prescription Please send in Rx to [facility] Please inform patient this was sent to pharmacy

## 2017-01-16 NOTE — Telephone Encounter (Signed)
Please add Spiriva 1 puff daily to his current regimen. Thanks, [employer]

## 2017-01-17 DIAGNOSIS — I5040 Unspecified combined systolic (congestive) and diastolic (congestive) heart failure: Secondary | ICD-10-CM | POA: Diagnosis not present

## 2017-01-17 DIAGNOSIS — E119 Type 2 diabetes mellitus without complications: Secondary | ICD-10-CM | POA: Diagnosis not present

## 2017-01-17 DIAGNOSIS — J449 Chronic obstructive pulmonary disease, unspecified: Secondary | ICD-10-CM | POA: Diagnosis not present

## 2017-01-17 DIAGNOSIS — I110 Hypertensive heart disease with heart failure: Secondary | ICD-10-CM | POA: Diagnosis not present

## 2017-01-17 DIAGNOSIS — J111 Influenza due to unidentified influenza virus with other respiratory manifestations: Secondary | ICD-10-CM | POA: Diagnosis not present

## 2017-01-17 MED ORDER — TIOTROPIUM BROMIDE MONOHYDRATE 18 MCG IN CAPS: 18.0000 ug | ORAL_CAPSULE | Freq: Every day | RESPIRATORY_TRACT | 5 refills | Status: AC

## 2017-01-17 NOTE — Addendum Note (Signed)
Addended by: Antonietta Barcelona D on: 01/17/2017 02:06 PM   Modules accepted: Orders

## 2017-01-17 NOTE — Telephone Encounter (Signed)
Pt aware prescription sent to pharmacy °

## 2017-01-19 ENCOUNTER — Other Ambulatory Visit: Payer: Self-pay | Admitting: Family Medicine

## 2017-01-19 DIAGNOSIS — E119 Type 2 diabetes mellitus without complications: Secondary | ICD-10-CM | POA: Diagnosis not present

## 2017-01-19 DIAGNOSIS — J111 Influenza due to unidentified influenza virus with other respiratory manifestations: Secondary | ICD-10-CM | POA: Diagnosis not present

## 2017-01-19 DIAGNOSIS — J449 Chronic obstructive pulmonary disease, unspecified: Secondary | ICD-10-CM | POA: Diagnosis not present

## 2017-01-19 DIAGNOSIS — I5040 Unspecified combined systolic (congestive) and diastolic (congestive) heart failure: Secondary | ICD-10-CM | POA: Diagnosis not present

## 2017-01-19 DIAGNOSIS — I110 Hypertensive heart disease with heart failure: Secondary | ICD-10-CM | POA: Diagnosis not present

## 2017-01-20 ENCOUNTER — Telehealth: Payer: Self-pay | Admitting: *Deleted

## 2017-01-20 DIAGNOSIS — J449 Chronic obstructive pulmonary disease, unspecified: Secondary | ICD-10-CM | POA: Diagnosis not present

## 2017-01-20 DIAGNOSIS — I5040 Unspecified combined systolic (congestive) and diastolic (congestive) heart failure: Secondary | ICD-10-CM | POA: Diagnosis not present

## 2017-01-20 DIAGNOSIS — I110 Hypertensive heart disease with heart failure: Secondary | ICD-10-CM | POA: Diagnosis not present

## 2017-01-20 DIAGNOSIS — E119 Type 2 diabetes mellitus without complications: Secondary | ICD-10-CM | POA: Diagnosis not present

## 2017-01-20 DIAGNOSIS — J111 Influenza due to unidentified influenza virus with other respiratory manifestations: Secondary | ICD-10-CM | POA: Diagnosis not present

## 2017-01-20 NOTE — Telephone Encounter (Signed)
TC from [facility] Pt seen this morning c/o tired, weakness, BP R arm 175/96 L arm 160/92, HA 6/10 7/10 last 24 hrs recently started on prednisone since last hospitilization. BG 225

## 2017-01-20 NOTE — Telephone Encounter (Signed)
Please contact the patient If he becomes dyspneic, or chest pain develops he should go to the E.D. The rest sounds okay for now (for this pt). WS

## 2017-01-22 DIAGNOSIS — T1490XA Injury, unspecified, initial encounter: Secondary | ICD-10-CM | POA: Diagnosis not present

## 2017-01-23 ENCOUNTER — Encounter: Payer: Self-pay | Admitting: Family Medicine

## 2017-01-23 ENCOUNTER — Ambulatory Visit: Payer: Medicare HMO | Admitting: Family Medicine

## 2017-01-23 ENCOUNTER — Ambulatory Visit (INDEPENDENT_AMBULATORY_CARE_PROVIDER_SITE_OTHER): Payer: Medicare HMO

## 2017-01-23 ENCOUNTER — Ambulatory Visit (INDEPENDENT_AMBULATORY_CARE_PROVIDER_SITE_OTHER): Payer: Medicare HMO | Admitting: Family Medicine

## 2017-01-23 VITALS — BP 141/80 | HR 66 | Temp 97.9°F | Ht 66.0 in | Wt 195.0 lb

## 2017-01-23 DIAGNOSIS — R0689 Other abnormalities of breathing: Secondary | ICD-10-CM | POA: Diagnosis not present

## 2017-01-23 DIAGNOSIS — J431 Panlobular emphysema: Secondary | ICD-10-CM

## 2017-01-23 DIAGNOSIS — J9612 Chronic respiratory failure with hypercapnia: Secondary | ICD-10-CM | POA: Diagnosis not present

## 2017-01-23 DIAGNOSIS — E138 Other specified diabetes mellitus with unspecified complications: Secondary | ICD-10-CM | POA: Diagnosis not present

## 2017-01-23 MED ORDER — ROPINIROLE HCL 2 MG PO TABS: 3.0000 mg | ORAL_TABLET | Freq: Every day | ORAL | 2 refills | Status: DC

## 2017-01-23 NOTE — Progress Notes (Signed)
Subjective:  Patient ID: Derek Shepherd, male    DOB: 11-Dec-1955  Age: 61 y.o. MRN: [id number]  CC: Hospitalization Follow-up (pt here today following up after being admitted for upper respiratory failure per pt.)   HPI Derek Shepherd presents for tired all the time. Not usng bipap recently hospitalized for respiratory failure.  He was also septic.  He was on IV antibiotics and finished those in the hospital.  He is currently off antibiotics and on nasal cannula oxygen.  He says that he had BiPAP in the hospital but he felt like he was suffocating and so he quit using it after 1 month hour.  He was encouraged to continue it at home but has not done so.  Review of the chart shows that he had hypercapnia with a CO2 of 60.  Depression screen [facility] 2/9 01/23/2017 12/20/2016 12/13/2016  Decreased Interest 1 1 1   Down, Depressed, Hopeless 1 1 1   PHQ - 2 Score 2 2 2   Altered sleeping 1 1 1   Tired, decreased energy 3 3 3   Change in appetite 2 2 2   Feeling bad or failure about yourself  1 1 1   Trouble concentrating 3 3 3   Moving slowly or fidgety/restless 2 2 2   Suicidal thoughts 0 0 0  PHQ-9 Score 14 14 14   Difficult doing work/chores - - -  Some recent data might be hidden    History Derek Shepherd has a past medical history of Adenomatous colon polyp, Anemia, Anxiety, Arthritis, Asthma, Bowel obstruction (Derek Shepherd), CHF (congestive heart failure) (Derek Shepherd), COPD (chronic obstructive pulmonary disease) (Derek Shepherd), Depression, Diverticulosis, DVT (deep venous thrombosis) (Derek Shepherd), Gallstones, GERD (gastroesophageal reflux disease), 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   He has a past surgical history that includes Cholecystectomy; Lung surgery (Left); Exploratory laparotomy; Hernia repair; Colonoscopy w/ biopsies; and Bone  marrow transplant (2011).   His family history includes Clotting disorder in his mother; Diabetes in his brother, father, maternal aunt, and sister; Heart attack in his father and mother; Prostate cancer in his brother.He reports that he quit smoking about 23 years ago. His smoking use included cigarettes. he has never used smokeless tobacco. He reports that he drinks alcohol. He reports that he does not use drugs.    ROS Review of Systems  Constitutional: Positive for activity change (Decreased) and fatigue. Negative for chills, diaphoresis and fever.  HENT: Negative for rhinorrhea and sore throat.   Respiratory: Positive for cough and shortness of breath.   Cardiovascular: Negative for chest pain.  Gastrointestinal: Negative for abdominal pain.  Musculoskeletal: Negative for arthralgias and myalgias.  Skin: Negative for rash.  Neurological: Positive for weakness. Negative for headaches.    Objective:  BP (!) 141/80   Pulse 66   Temp 97.9 F (36.6 C) (Oral)   Ht 5' 6"  (1.676 m)   Wt 195 lb (88.5 kg)   SpO2 98%   BMI 31.47 kg/m   BP Readings from Last 3 Encounters:  01/23/17 (!) 141/80  01/02/17 135/78  12/20/16 121/81    Wt Readings from Last 3 Encounters:  01/23/17 195 lb (88.5 kg)  01/01/17 194 lb (88 kg)  12/20/16 202 lb (91.6 kg)     Physical Exam  Constitutional: He is oriented to person, place, and time. He appears well-developed and well-nourished. No distress.  HENT:  Head: Normocephalic and atraumatic.  Right  Ear: External ear normal.  Left Ear: External ear normal.  Nose: Nose normal.  Mouth/Throat: Oropharynx is clear and moist.  Eyes: Conjunctivae and EOM are normal. Pupils are equal, round, and reactive to light.  Neck: Normal range of motion. Neck supple. No thyromegaly present.  Cardiovascular: Normal rate, regular rhythm and normal heart sounds.  No murmur heard. Pulmonary/Chest: No respiratory distress. He has wheezes. He has no rales.    Abdominal: Soft. Bowel sounds are normal. He exhibits no distension. There is no tenderness.  Lymphadenopathy:    He has no cervical adenopathy.  Neurological: He is alert and oriented to person, place, and time. He has normal reflexes.  Skin: Skin is warm and dry.  Psychiatric: He has a normal mood and affect. His behavior is normal. Thought content normal.   CXR - emphysema & bronchiectatic changes   Assessment & Plan:   Derek Shepherd was seen today for hospitalization follow-up.  Diagnoses and all orders for this visit:  Panlobular emphysema (Derek Shepherd) -     DG Chest 2 View; Future -     CBC with Differential/Platelet -     CMP14+EGFR -     Ambulatory referral to Pulmonology  Hypercapnia  Chronic hypercapnic respiratory failure (HCC)  Other orders -     rOPINIRole (REQUIP) 2 MG tablet; Take 1.5 tablets (3 mg total) at bedtime by mouth.       I have discontinued [name]. [name]'s diphenoxylate-atropine, montelukast, HYDROmorphone, methylPREDNISolone, levofloxacin, fluconazole, piperacillin-tazobactam, (vancomycin 1,250 mg in sodium chloride 0.9 % 250 mL), nefazodone, and predniSONE. I have also changed his rOPINIRole. Additionally, I am having him maintain his vitamin B-12, fluticasone, lactulose, multivitamin with minerals, Polyethyl Glycol-Propyl Glycol, acidophilus, senna, torsemide, mycophenolate, umeclidinium bromide, Sirolimus, metFORMIN, glucose blood, SYMBICORT, levalbuterol, potassium chloride, citalopram, folic acid, pantoprazole, and tiotropium.  Allergies as of 01/23/2017      Reactions   Nsaids Shortness Of Breath      Medication List        Accurate as of 01/23/17  9:45 PM. Always use your most recent med list.          acidophilus Caps capsule Take 1 capsule by mouth daily.   citalopram 40 MG tablet Commonly known as:  CELEXA TAKE ONE TABLET BY MOUTH ONCE DAILY, REDUCE TO 1/2 TABLET ONCE DAILY WHILE TAKING FLUCONAZOLE FOR 2 WEEKS.   fluticasone 50  MCG/ACT nasal spray Commonly known as:  FLONASE Place 1 spray into both nostrils daily.   folic acid 1 MG tablet Commonly known as:  FOLVITE TAKE 1 TABLET BY MOUTH ONCE DAILY   glucose blood test strip Use as instructed   lactulose 10 GM/15ML solution Commonly known as:  CHRONULAC Take 20 g by mouth 2 (two) times daily as needed for mild constipation.   levalbuterol 1.25 MG/3ML nebulizer solution Commonly known as:  XOPENEX USE ONE VIAL IN NEBULIZER 4 TIMES DAILY   metFORMIN 500 MG tablet Commonly known as:  GLUCOPHAGE TAKE ONE TABLET BY MOUTH TWICE DAILY WITH A MEAL   multivitamin with minerals Tabs tablet Take 1 tablet by mouth daily.   mycophenolate 500 MG tablet Commonly known as:  CELLCEPT Take 500 mg by mouth.   pantoprazole 40 MG tablet Commonly known as:  PROTONIX Take 1 tablet (40 mg total) by mouth daily.   potassium chloride 10 MEQ CR capsule Commonly known as:  MICRO-K TAKE THREE CAPSULES BY MOUTH TWICE DAILY   rOPINIRole 2 MG tablet Commonly known as:  REQUIP Take 1.5  tablets (3 mg total) at bedtime by mouth.   senna 8.6 MG tablet Commonly known as:  SENOKOT Take 2 tablets by mouth daily as needed for constipation.   Sirolimus 0.5 MG tablet Commonly known as:  RAPAMUNE TAKE ONE TABLET BY MOUTH ONCE DAILY   SYMBICORT 160-4.5 MCG/ACT inhaler Generic drug:  budesonide-formoterol INHALE TWO PUFFS BY MOUTH ONCE DAILY IN THE MORNING   SYSTANE 0.4-0.3 % Soln Generic drug:  Polyethyl Glycol-Propyl Glycol Place 1 drop into both eyes as needed (for dry eyes).   tiotropium 18 MCG inhalation capsule Commonly known as:  SPIRIVA HANDIHALER Place 1 capsule (18 mcg total) daily into inhaler and inhale.   torsemide 100 MG tablet Commonly known as:  DEMADEX Take 1 tablet (100 mg total) by mouth daily.   umeclidinium bromide 62.5 MCG/INH Aepb Commonly known as:  INCRUSE ELLIPTA Inhale 1 puff into the lungs daily.   vitamin B-12 1000 MCG tablet Commonly  known as:  CYANOCOBALAMIN Take 1,000 mcg by mouth daily.        Follow-up: Return in about 2 months (around 03/25/2017).  Claretta Fraise, M.D.

## 2017-01-24 ENCOUNTER — Telehealth: Payer: Self-pay | Admitting: Family Medicine

## 2017-01-24 DIAGNOSIS — R0602 Shortness of breath: Secondary | ICD-10-CM | POA: Diagnosis not present

## 2017-01-24 DIAGNOSIS — J449 Chronic obstructive pulmonary disease, unspecified: Secondary | ICD-10-CM | POA: Diagnosis not present

## 2017-01-24 DIAGNOSIS — I5040 Unspecified combined systolic (congestive) and diastolic (congestive) heart failure: Secondary | ICD-10-CM | POA: Diagnosis not present

## 2017-01-24 DIAGNOSIS — J111 Influenza due to unidentified influenza virus with other respiratory manifestations: Secondary | ICD-10-CM | POA: Diagnosis not present

## 2017-01-24 DIAGNOSIS — E119 Type 2 diabetes mellitus without complications: Secondary | ICD-10-CM | POA: Diagnosis not present

## 2017-01-24 DIAGNOSIS — I110 Hypertensive heart disease with heart failure: Secondary | ICD-10-CM | POA: Diagnosis not present

## 2017-01-24 LAB — CMP14+EGFR
ALBUMIN: 3.9 g/dL (ref 3.5–5.5)
ALT: 31 IU/L (ref 0–44)
AST: 26 IU/L (ref 0–40)
Albumin/Globulin Ratio: 1.4 (ref 1.2–2.2)
Alkaline Phosphatase: 58 IU/L (ref 39–117)
BUN / CREAT RATIO: 30 — AB (ref 9–20)
BUN: 34 mg/dL — AB (ref 6–24)
Bilirubin Total: <0.2 mg/dL (ref 0.0–1.2)
CO2: 32 mmol/L — AB (ref 20–29)
CREATININE: 1.13 mg/dL (ref 0.76–1.27)
Calcium: 9.8 mg/dL (ref 8.7–10.2)
Chloride: 84 mmol/L — ABNORMAL LOW (ref 96–106)
GFR calc non Af Amer: 71 mL/min/{1.73_m2} (ref >59)
GFR, EST AFRICAN AMERICAN: 82 mL/min/{1.73_m2} (ref >59)
GLUCOSE: 248 mg/dL — AB (ref 65–99)
Globulin, Total: 2.8 g/dL (ref 1.5–4.5)
Potassium: 4.4 mmol/L (ref 3.5–5.2)
Sodium: 136 mmol/L (ref 134–144)
TOTAL PROTEIN: 6.7 g/dL (ref 6.0–8.5)

## 2017-01-24 LAB — CBC WITH DIFFERENTIAL/PLATELET
Basophils Absolute: 0 10*3/uL (ref 0.0–0.2)
Basos: 0 %
EOS (ABSOLUTE): 0 10*3/uL (ref 0.0–0.4)
EOS: 0 %
HEMATOCRIT: 30.4 % — AB (ref 37.5–51.0)
HEMOGLOBIN: 10.2 g/dL — AB (ref 13.0–17.7)
IMMATURE GRANULOCYTES: 0 %
Immature Grans (Abs): 0 10*3/uL (ref 0.0–0.1)
LYMPHS ABS: 1 10*3/uL (ref 0.7–3.1)
Lymphs: 14 %
MCH: 32.9 pg (ref 26.6–33.0)
MCHC: 33.6 g/dL (ref 31.5–35.7)
MCV: 98 fL — ABNORMAL HIGH (ref 79–97)
Monocytes Absolute: 0.3 10*3/uL (ref 0.1–0.9)
Monocytes: 4 %
Neutrophils Absolute: 5.7 10*3/uL (ref 1.4–7.0)
Neutrophils: 82 %
Platelets: 60 10*3/uL — CL (ref 150–379)
RBC: 3.1 x10E6/uL — AB (ref 4.14–5.80)
RDW: 17.4 % — ABNORMAL HIGH (ref 12.3–15.4)
WBC: 7 10*3/uL (ref 3.4–10.8)

## 2017-01-24 NOTE — Telephone Encounter (Signed)
Patient called and said he is returning someone here's phone call.  I have asked all the nurses and every one said they have not called him.  Is it possible Derek Shepherd was trying to call him about a referral?

## 2017-01-24 NOTE — Telephone Encounter (Signed)
Patient seen since phone call.  This encounter will now be closed 

## 2017-01-25 DIAGNOSIS — M25562 Pain in left knee: Secondary | ICD-10-CM | POA: Diagnosis not present

## 2017-01-25 DIAGNOSIS — G8929 Other chronic pain: Secondary | ICD-10-CM | POA: Diagnosis not present

## 2017-01-25 DIAGNOSIS — M79605 Pain in left leg: Secondary | ICD-10-CM | POA: Diagnosis not present

## 2017-01-25 DIAGNOSIS — G894 Chronic pain syndrome: Secondary | ICD-10-CM | POA: Diagnosis not present

## 2017-01-27 ENCOUNTER — Other Ambulatory Visit: Payer: Self-pay | Admitting: Family Medicine

## 2017-01-27 DIAGNOSIS — I110 Hypertensive heart disease with heart failure: Secondary | ICD-10-CM | POA: Diagnosis not present

## 2017-01-27 DIAGNOSIS — E119 Type 2 diabetes mellitus without complications: Secondary | ICD-10-CM | POA: Diagnosis not present

## 2017-01-27 DIAGNOSIS — J449 Chronic obstructive pulmonary disease, unspecified: Secondary | ICD-10-CM | POA: Diagnosis not present

## 2017-01-27 DIAGNOSIS — J111 Influenza due to unidentified influenza virus with other respiratory manifestations: Secondary | ICD-10-CM | POA: Diagnosis not present

## 2017-01-27 DIAGNOSIS — I5040 Unspecified combined systolic (congestive) and diastolic (congestive) heart failure: Secondary | ICD-10-CM | POA: Diagnosis not present

## 2017-01-29 ENCOUNTER — Inpatient Hospital Stay (HOSPITAL_COMMUNITY)
Admission: EM | Admit: 2017-01-29 | Discharge: 2017-02-04 | DRG: 871 | Disposition: A | Discharge status: Other Acute Inpatient Hospital | Payer: Medicare HMO | Attending: Internal Medicine | Admitting: Internal Medicine

## 2017-01-29 ENCOUNTER — Emergency Department (HOSPITAL_COMMUNITY): Payer: Medicare HMO

## 2017-01-29 ENCOUNTER — Other Ambulatory Visit: Payer: Self-pay

## 2017-01-29 ENCOUNTER — Encounter (HOSPITAL_COMMUNITY): Payer: Self-pay | Admitting: Emergency Medicine

## 2017-01-29 ENCOUNTER — Inpatient Hospital Stay (HOSPITAL_COMMUNITY): Payer: Medicare HMO

## 2017-01-29 DIAGNOSIS — E874 Mixed disorder of acid-base balance: Secondary | ICD-10-CM | POA: Diagnosis not present

## 2017-01-29 DIAGNOSIS — Z86711 Personal history of pulmonary embolism: Secondary | ICD-10-CM | POA: Diagnosis not present

## 2017-01-29 DIAGNOSIS — I5022 Chronic systolic (congestive) heart failure: Secondary | ICD-10-CM | POA: Diagnosis not present

## 2017-01-29 DIAGNOSIS — I5030 Unspecified diastolic (congestive) heart failure: Secondary | ICD-10-CM | POA: Diagnosis not present

## 2017-01-29 DIAGNOSIS — F419 Anxiety disorder, unspecified: Secondary | ICD-10-CM | POA: Diagnosis present

## 2017-01-29 DIAGNOSIS — I951 Orthostatic hypotension: Secondary | ICD-10-CM | POA: Diagnosis present

## 2017-01-29 DIAGNOSIS — I2782 Chronic pulmonary embolism: Secondary | ICD-10-CM | POA: Diagnosis present

## 2017-01-29 DIAGNOSIS — R609 Edema, unspecified: Secondary | ICD-10-CM | POA: Diagnosis not present

## 2017-01-29 DIAGNOSIS — C9150 Adult T-cell lymphoma/leukemia (HTLV-1-associated) not having achieved remission: Secondary | ICD-10-CM | POA: Diagnosis present

## 2017-01-29 DIAGNOSIS — E119 Type 2 diabetes mellitus without complications: Secondary | ICD-10-CM | POA: Diagnosis present

## 2017-01-29 DIAGNOSIS — D801 Nonfamilial hypogammaglobulinemia: Secondary | ICD-10-CM | POA: Diagnosis not present

## 2017-01-29 DIAGNOSIS — Z833 Family history of diabetes mellitus: Secondary | ICD-10-CM

## 2017-01-29 DIAGNOSIS — R220 Localized swelling, mass and lump, head: Secondary | ICD-10-CM | POA: Diagnosis not present

## 2017-01-29 DIAGNOSIS — J9612 Chronic respiratory failure with hypercapnia: Secondary | ICD-10-CM

## 2017-01-29 DIAGNOSIS — Z79899 Other long term (current) drug therapy: Secondary | ICD-10-CM

## 2017-01-29 DIAGNOSIS — K122 Cellulitis and abscess of mouth: Secondary | ICD-10-CM | POA: Diagnosis not present

## 2017-01-29 DIAGNOSIS — F329 Major depressive disorder, single episode, unspecified: Secondary | ICD-10-CM | POA: Diagnosis present

## 2017-01-29 DIAGNOSIS — D696 Thrombocytopenia, unspecified: Secondary | ICD-10-CM | POA: Diagnosis not present

## 2017-01-29 DIAGNOSIS — T50905A Adverse effect of unspecified drugs, medicaments and biological substances, initial encounter: Secondary | ICD-10-CM

## 2017-01-29 DIAGNOSIS — R0602 Shortness of breath: Secondary | ICD-10-CM | POA: Diagnosis not present

## 2017-01-29 DIAGNOSIS — Y92009 Unspecified place in unspecified non-institutional (private) residence as the place of occurrence of the external cause: Secondary | ICD-10-CM | POA: Diagnosis not present

## 2017-01-29 DIAGNOSIS — E118 Type 2 diabetes mellitus with unspecified complications: Secondary | ICD-10-CM

## 2017-01-29 DIAGNOSIS — J9622 Acute and chronic respiratory failure with hypercapnia: Secondary | ICD-10-CM | POA: Diagnosis present

## 2017-01-29 DIAGNOSIS — A419 Sepsis, unspecified organism: Principal | ICD-10-CM | POA: Diagnosis present

## 2017-01-29 DIAGNOSIS — R509 Fever, unspecified: Secondary | ICD-10-CM | POA: Diagnosis not present

## 2017-01-29 DIAGNOSIS — S199XXA Unspecified injury of neck, initial encounter: Secondary | ICD-10-CM | POA: Diagnosis not present

## 2017-01-29 DIAGNOSIS — L0291 Cutaneous abscess, unspecified: Secondary | ICD-10-CM | POA: Diagnosis not present

## 2017-01-29 DIAGNOSIS — L0211 Cutaneous abscess of neck: Secondary | ICD-10-CM | POA: Diagnosis not present

## 2017-01-29 DIAGNOSIS — J8489 Other specified interstitial pulmonary diseases: Secondary | ICD-10-CM

## 2017-01-29 DIAGNOSIS — Z886 Allergy status to analgesic agent status: Secondary | ICD-10-CM

## 2017-01-29 DIAGNOSIS — E1165 Type 2 diabetes mellitus with hyperglycemia: Secondary | ICD-10-CM | POA: Diagnosis present

## 2017-01-29 DIAGNOSIS — D89811 Chronic graft-versus-host disease: Secondary | ICD-10-CM | POA: Diagnosis present

## 2017-01-29 DIAGNOSIS — K219 Gastro-esophageal reflux disease without esophagitis: Secondary | ICD-10-CM | POA: Diagnosis present

## 2017-01-29 DIAGNOSIS — J9621 Acute and chronic respiratory failure with hypoxia: Secondary | ICD-10-CM

## 2017-01-29 DIAGNOSIS — S3991XA Unspecified injury of abdomen, initial encounter: Secondary | ICD-10-CM | POA: Diagnosis not present

## 2017-01-29 DIAGNOSIS — Z9481 Bone marrow transplant status: Secondary | ICD-10-CM

## 2017-01-29 DIAGNOSIS — I5042 Chronic combined systolic (congestive) and diastolic (congestive) heart failure: Secondary | ICD-10-CM | POA: Diagnosis not present

## 2017-01-29 DIAGNOSIS — Z86718 Personal history of other venous thrombosis and embolism: Secondary | ICD-10-CM

## 2017-01-29 DIAGNOSIS — D539 Nutritional anemia, unspecified: Secondary | ICD-10-CM | POA: Diagnosis not present

## 2017-01-29 DIAGNOSIS — J449 Chronic obstructive pulmonary disease, unspecified: Secondary | ICD-10-CM | POA: Diagnosis not present

## 2017-01-29 DIAGNOSIS — J42 Unspecified chronic bronchitis: Secondary | ICD-10-CM | POA: Diagnosis not present

## 2017-01-29 DIAGNOSIS — R569 Unspecified convulsions: Secondary | ICD-10-CM | POA: Diagnosis not present

## 2017-01-29 DIAGNOSIS — G894 Chronic pain syndrome: Secondary | ICD-10-CM | POA: Diagnosis present

## 2017-01-29 DIAGNOSIS — N179 Acute kidney failure, unspecified: Secondary | ICD-10-CM | POA: Diagnosis not present

## 2017-01-29 DIAGNOSIS — Z9981 Dependence on supplemental oxygen: Secondary | ICD-10-CM | POA: Diagnosis not present

## 2017-01-29 DIAGNOSIS — Z832 Family history of diseases of the blood and blood-forming organs and certain disorders involving the immune mechanism: Secondary | ICD-10-CM | POA: Diagnosis not present

## 2017-01-29 DIAGNOSIS — J9611 Chronic respiratory failure with hypoxia: Secondary | ICD-10-CM | POA: Diagnosis not present

## 2017-01-29 DIAGNOSIS — C9100 Acute lymphoblastic leukemia not having achieved remission: Secondary | ICD-10-CM | POA: Diagnosis not present

## 2017-01-29 DIAGNOSIS — D6959 Other secondary thrombocytopenia: Secondary | ICD-10-CM | POA: Diagnosis present

## 2017-01-29 DIAGNOSIS — D89813 Graft-versus-host disease, unspecified: Secondary | ICD-10-CM | POA: Diagnosis not present

## 2017-01-29 DIAGNOSIS — E860 Dehydration: Secondary | ICD-10-CM | POA: Diagnosis present

## 2017-01-29 DIAGNOSIS — L989 Disorder of the skin and subcutaneous tissue, unspecified: Secondary | ICD-10-CM | POA: Diagnosis not present

## 2017-01-29 DIAGNOSIS — S299XXA Unspecified injury of thorax, initial encounter: Secondary | ICD-10-CM | POA: Diagnosis not present

## 2017-01-29 DIAGNOSIS — Z87891 Personal history of nicotine dependence: Secondary | ICD-10-CM | POA: Diagnosis not present

## 2017-01-29 DIAGNOSIS — T07XXXA Unspecified multiple injuries, initial encounter: Secondary | ICD-10-CM | POA: Diagnosis not present

## 2017-01-29 DIAGNOSIS — IMO0002 Reserved for concepts with insufficient information to code with codable children: Secondary | ICD-10-CM | POA: Diagnosis present

## 2017-01-29 DIAGNOSIS — Z7984 Long term (current) use of oral hypoglycemic drugs: Secondary | ICD-10-CM

## 2017-01-29 DIAGNOSIS — Z9049 Acquired absence of other specified parts of digestive tract: Secondary | ICD-10-CM

## 2017-01-29 DIAGNOSIS — R9431 Abnormal electrocardiogram [ECG] [EKG]: Secondary | ICD-10-CM | POA: Diagnosis not present

## 2017-01-29 DIAGNOSIS — L02811 Cutaneous abscess of head [any part, except face]: Secondary | ICD-10-CM | POA: Diagnosis not present

## 2017-01-29 DIAGNOSIS — Z7901 Long term (current) use of anticoagulants: Secondary | ICD-10-CM

## 2017-01-29 DIAGNOSIS — S301XXA Contusion of abdominal wall, initial encounter: Secondary | ICD-10-CM | POA: Diagnosis not present

## 2017-01-29 DIAGNOSIS — K5790 Diverticulosis of intestine, part unspecified, without perforation or abscess without bleeding: Secondary | ICD-10-CM | POA: Diagnosis present

## 2017-01-29 DIAGNOSIS — I509 Heart failure, unspecified: Secondary | ICD-10-CM | POA: Diagnosis not present

## 2017-01-29 DIAGNOSIS — Z79891 Long term (current) use of opiate analgesic: Secondary | ICD-10-CM

## 2017-01-29 DIAGNOSIS — Z9484 Stem cells transplant status: Secondary | ICD-10-CM | POA: Diagnosis not present

## 2017-01-29 DIAGNOSIS — R55 Syncope and collapse: Secondary | ICD-10-CM

## 2017-01-29 DIAGNOSIS — D649 Anemia, unspecified: Secondary | ICD-10-CM | POA: Diagnosis not present

## 2017-01-29 DIAGNOSIS — C9101 Acute lymphoblastic leukemia, in remission: Secondary | ICD-10-CM | POA: Diagnosis not present

## 2017-01-29 DIAGNOSIS — E872 Acidosis: Secondary | ICD-10-CM | POA: Diagnosis not present

## 2017-01-29 DIAGNOSIS — Z8042 Family history of malignant neoplasm of prostate: Secondary | ICD-10-CM

## 2017-01-29 DIAGNOSIS — Z856 Personal history of leukemia: Secondary | ICD-10-CM | POA: Diagnosis not present

## 2017-01-29 DIAGNOSIS — K089 Disorder of teeth and supporting structures, unspecified: Secondary | ICD-10-CM | POA: Diagnosis not present

## 2017-01-29 DIAGNOSIS — R0902 Hypoxemia: Secondary | ICD-10-CM | POA: Diagnosis not present

## 2017-01-29 DIAGNOSIS — S069X9A Unspecified intracranial injury with loss of consciousness of unspecified duration, initial encounter: Secondary | ICD-10-CM | POA: Diagnosis not present

## 2017-01-29 DIAGNOSIS — Z8249 Family history of ischemic heart disease and other diseases of the circulatory system: Secondary | ICD-10-CM

## 2017-01-29 DIAGNOSIS — D6189 Other specified aplastic anemias and other bone marrow failure syndromes: Secondary | ICD-10-CM | POA: Diagnosis not present

## 2017-01-29 DIAGNOSIS — D61818 Other pancytopenia: Secondary | ICD-10-CM | POA: Diagnosis not present

## 2017-01-29 DIAGNOSIS — M0629 Rheumatoid bursitis, multiple sites: Secondary | ICD-10-CM | POA: Diagnosis not present

## 2017-01-29 DIAGNOSIS — J431 Panlobular emphysema: Secondary | ICD-10-CM | POA: Diagnosis not present

## 2017-01-29 DIAGNOSIS — Z86010 Personal history of colonic polyps: Secondary | ICD-10-CM

## 2017-01-29 DIAGNOSIS — S025XXA Fracture of tooth (traumatic), initial encounter for closed fracture: Secondary | ICD-10-CM | POA: Diagnosis not present

## 2017-01-29 DIAGNOSIS — Z7951 Long term (current) use of inhaled steroids: Secondary | ICD-10-CM

## 2017-01-29 DIAGNOSIS — I5041 Acute combined systolic (congestive) and diastolic (congestive) heart failure: Secondary | ICD-10-CM | POA: Diagnosis not present

## 2017-01-29 DIAGNOSIS — L080 Pyoderma: Secondary | ICD-10-CM | POA: Diagnosis not present

## 2017-01-29 LAB — COMPREHENSIVE METABOLIC PANEL
ALBUMIN: 3 g/dL — AB (ref 3.5–5.0)
ALT: 35 U/L (ref 17–63)
ANION GAP: 14 (ref 5–15)
AST: 36 U/L (ref 15–41)
Alkaline Phosphatase: 53 U/L (ref 38–126)
BILIRUBIN TOTAL: 0.7 mg/dL (ref 0.3–1.2)
BUN: 39 mg/dL — AB (ref 6–20)
CHLORIDE: 88 mmol/L — AB (ref 101–111)
CO2: 33 mmol/L — ABNORMAL HIGH (ref 22–32)
Calcium: 9.1 mg/dL (ref 8.9–10.3)
Creatinine, Ser: 1.32 mg/dL — ABNORMAL HIGH (ref 0.61–1.24)
GFR calc Af Amer: >60 mL/min (ref >60)
GFR calc non Af Amer: 57 mL/min — ABNORMAL LOW (ref >60)
GLUCOSE: 253 mg/dL — AB (ref 65–99)
POTASSIUM: 4 mmol/L (ref 3.5–5.1)
Sodium: 135 mmol/L (ref 135–145)
TOTAL PROTEIN: 5.9 g/dL — AB (ref 6.5–8.1)

## 2017-01-29 LAB — BLOOD GAS, ARTERIAL
Acid-Base Excess: 5.3 mmol/L — ABNORMAL HIGH (ref 0.0–2.0)
Bicarbonate: 29.2 mmol/L — ABNORMAL HIGH (ref 20.0–28.0)
DRAWN BY: 52076
O2 Content: 4 L/min
O2 Saturation: 98.3 %
PATIENT TEMPERATURE: 98.6
pCO2 arterial: 41.4 mmHg (ref 32.0–48.0)
pH, Arterial: 7.461 — ABNORMAL HIGH (ref 7.350–7.450)
pO2, Arterial: 125 mmHg — ABNORMAL HIGH (ref 83.0–108.0)

## 2017-01-29 LAB — URINALYSIS, ROUTINE W REFLEX MICROSCOPIC
Bilirubin Urine: NEGATIVE
GLUCOSE, UA: 150 mg/dL — AB
HGB URINE DIPSTICK: NEGATIVE
KETONES UR: NEGATIVE mg/dL
Leukocytes, UA: NEGATIVE
NITRITE: NEGATIVE
PROTEIN: 100 mg/dL — AB
Specific Gravity, Urine: 1.008 (ref 1.005–1.030)
pH: 7 (ref 5.0–8.0)

## 2017-01-29 LAB — CBC WITH DIFFERENTIAL/PLATELET
BASOS ABS: 0 10*3/uL (ref 0.0–0.1)
BASOS PCT: 0 %
EOS ABS: 0 10*3/uL (ref 0.0–0.7)
EOS PCT: 0 %
HCT: 28.1 % — ABNORMAL LOW (ref 39.0–52.0)
Hemoglobin: 8.8 g/dL — ABNORMAL LOW (ref 13.0–17.0)
Lymphocytes Relative: 22 %
Lymphs Abs: 1.7 10*3/uL (ref 0.7–4.0)
MCH: 32.4 pg (ref 26.0–34.0)
MCHC: 31.3 g/dL (ref 30.0–36.0)
MCV: 103.3 fL — ABNORMAL HIGH (ref 78.0–100.0)
MONO ABS: 0.5 10*3/uL (ref 0.1–1.0)
MONOS PCT: 7 %
Neutro Abs: 5.5 10*3/uL (ref 1.7–7.7)
Neutrophils Relative %: 72 %
PLATELETS: 44 10*3/uL — AB (ref 150–400)
RBC: 2.72 MIL/uL — ABNORMAL LOW (ref 4.22–5.81)
RDW: 17.1 % — AB (ref 11.5–15.5)
WBC: 7.7 10*3/uL (ref 4.0–10.5)

## 2017-01-29 LAB — RESPIRATORY PANEL BY PCR
ADENOVIRUS-RVPPCR: NOT DETECTED
Bordetella pertussis: NOT DETECTED
CHLAMYDOPHILA PNEUMONIAE-RVPPCR: NOT DETECTED
CORONAVIRUS 229E-RVPPCR: NOT DETECTED
Coronavirus HKU1: NOT DETECTED
Coronavirus NL63: NOT DETECTED
Coronavirus OC43: NOT DETECTED
INFLUENZA A-RVPPCR: NOT DETECTED
INFLUENZA B-RVPPCR: NOT DETECTED
MYCOPLASMA PNEUMONIAE-RVPPCR: NOT DETECTED
Metapneumovirus: NOT DETECTED
PARAINFLUENZA VIRUS 1-RVPPCR: NOT DETECTED
PARAINFLUENZA VIRUS 2-RVPPCR: NOT DETECTED
PARAINFLUENZA VIRUS 4-RVPPCR: NOT DETECTED
Parainfluenza Virus 3: NOT DETECTED
RESPIRATORY SYNCYTIAL VIRUS-RVPPCR: NOT DETECTED
Rhinovirus / Enterovirus: NOT DETECTED

## 2017-01-29 LAB — PROCALCITONIN

## 2017-01-29 LAB — LACTIC ACID, PLASMA
Lactic Acid, Venous: 5.4 mmol/L (ref 0.5–1.9)
Lactic Acid, Venous: 8.5 mmol/L (ref 0.5–1.9)

## 2017-01-29 LAB — I-STAT CG4 LACTIC ACID, ED
LACTIC ACID, VENOUS: 5.61 mmol/L — AB (ref 0.5–1.9)
Lactic Acid, Venous: 7.43 mmol/L (ref 0.5–1.9)

## 2017-01-29 LAB — INFLUENZA PANEL BY PCR (TYPE A & B)
INFLBPCR: NEGATIVE
Influenza A By PCR: NEGATIVE

## 2017-01-29 LAB — PROTIME-INR
INR: 2.33
PROTHROMBIN TIME: 25.4 s — AB (ref 11.4–15.2)

## 2017-01-29 LAB — I-STAT TROPONIN, ED: TROPONIN I, POC: 0.05 ng/mL (ref 0.00–0.08)

## 2017-01-29 LAB — CK: Total CK: 35 U/L — ABNORMAL LOW (ref 49–397)

## 2017-01-29 LAB — LACTATE DEHYDROGENASE: LDH: 345 U/L — AB (ref 98–192)

## 2017-01-29 LAB — APTT: aPTT: 32 seconds (ref 24–36)

## 2017-01-29 LAB — GLUCOSE, CAPILLARY: Glucose-Capillary: 306 mg/dL — ABNORMAL HIGH (ref 65–99)

## 2017-01-29 MED ORDER — FOLIC ACID 1 MG PO TABS
1.0000 mg | ORAL_TABLET | Freq: Every day | ORAL | Status: DC
  Administered 2017-01-30 – 2017-02-04 (×6): 1 mg via ORAL
  Filled 2017-01-29 (×6): qty 1

## 2017-01-29 MED ORDER — PREDNISONE 10 MG PO TABS
10.0000 mg | ORAL_TABLET | Freq: Every day | ORAL | Status: AC
  Administered 2017-01-30 – 2017-02-02 (×4): 10 mg via ORAL
  Filled 2017-01-29 (×4): qty 1

## 2017-01-29 MED ORDER — NEFAZODONE HCL 50 MG PO TABS
50.0000 mg | ORAL_TABLET | Freq: Two times a day (BID) | ORAL | Status: DC
  Administered 2017-01-30 – 2017-02-04 (×11): 50 mg via ORAL
  Filled 2017-01-29 (×12): qty 1

## 2017-01-29 MED ORDER — SODIUM CHLORIDE 0.9 % IV BOLUS (SEPSIS)
1000.0000 mL | Freq: Once | INTRAVENOUS | Status: AC
Start: 2017-01-29 — End: 2017-01-29
  Administered 2017-01-29: 1000 mL via INTRAVENOUS

## 2017-01-29 MED ORDER — ADULT MULTIVITAMIN W/MINERALS CH
1.0000 | ORAL_TABLET | Freq: Every day | ORAL | Status: DC
  Administered 2017-01-30 – 2017-02-04 (×6): 1 via ORAL
  Filled 2017-01-29 (×6): qty 1

## 2017-01-29 MED ORDER — SODIUM CHLORIDE 0.9 % IV BOLUS (SEPSIS): 1000.0000 mL | Freq: Once | INTRAVENOUS | Status: DC

## 2017-01-29 MED ORDER — MOMETASONE FURO-FORMOTEROL FUM 200-5 MCG/ACT IN AERO
2.0000 | INHALATION_SPRAY | Freq: Two times a day (BID) | RESPIRATORY_TRACT | Status: DC
  Administered 2017-01-30 – 2017-02-04 (×11): 2 via RESPIRATORY_TRACT
  Filled 2017-01-29: qty 8.8

## 2017-01-29 MED ORDER — INSULIN ASPART 100 UNIT/ML ~~LOC~~ SOLN
0.0000 [IU] | SUBCUTANEOUS | Status: DC
  Administered 2017-01-29: 11 [IU] via SUBCUTANEOUS
  Administered 2017-01-30 (×3): 3 [IU] via SUBCUTANEOUS
  Administered 2017-01-30: 2 [IU] via SUBCUTANEOUS
  Administered 2017-01-30: 5 [IU] via SUBCUTANEOUS
  Administered 2017-01-30: 8 [IU] via SUBCUTANEOUS
  Administered 2017-01-31: 2 [IU] via SUBCUTANEOUS
  Administered 2017-01-31: 8 [IU] via SUBCUTANEOUS
  Administered 2017-01-31: 5 [IU] via SUBCUTANEOUS
  Administered 2017-01-31: 8 [IU] via SUBCUTANEOUS
  Administered 2017-02-01: 2 [IU] via SUBCUTANEOUS
  Administered 2017-02-01 (×3): 3 [IU] via SUBCUTANEOUS
  Administered 2017-02-01: 2 [IU] via SUBCUTANEOUS
  Administered 2017-02-02 (×2): 3 [IU] via SUBCUTANEOUS
  Administered 2017-02-02: 5 [IU] via SUBCUTANEOUS
  Administered 2017-02-03 (×2): 2 [IU] via SUBCUTANEOUS
  Administered 2017-02-04: 3 [IU] via SUBCUTANEOUS

## 2017-01-29 MED ORDER — LORAZEPAM 1 MG PO TABS
1.0000 mg | ORAL_TABLET | Freq: Every day | ORAL | Status: DC
  Administered 2017-01-29 – 2017-02-03 (×6): 1 mg via ORAL
  Filled 2017-01-29 (×6): qty 1

## 2017-01-29 MED ORDER — UMECLIDINIUM BROMIDE 62.5 MCG/INH IN AEPB: 1.0000 | INHALATION_SPRAY | Freq: Every day | RESPIRATORY_TRACT | Status: DC

## 2017-01-29 MED ORDER — ONDANSETRON HCL 4 MG PO TABS: 4.0000 mg | ORAL_TABLET | Freq: Four times a day (QID) | ORAL | Status: DC | PRN

## 2017-01-29 MED ORDER — CLONAZEPAM 0.5 MG PO TABS
0.5000 mg | ORAL_TABLET | Freq: Two times a day (BID) | ORAL | Status: DC | PRN
  Administered 2017-02-01: 0.5 mg via ORAL
  Filled 2017-01-29: qty 1

## 2017-01-29 MED ORDER — TIOTROPIUM BROMIDE MONOHYDRATE 18 MCG IN CAPS
18.0000 ug | ORAL_CAPSULE | Freq: Every day | RESPIRATORY_TRACT | Status: DC
  Administered 2017-01-30 – 2017-02-02 (×4): 18 ug via RESPIRATORY_TRACT
  Filled 2017-01-29: qty 5

## 2017-01-29 MED ORDER — VANCOMYCIN HCL 10 G IV SOLR
1500.0000 mg | Freq: Once | INTRAVENOUS | Status: AC
  Administered 2017-01-29: 1500 mg via INTRAVENOUS
  Filled 2017-01-29: qty 1500

## 2017-01-29 MED ORDER — ACETAMINOPHEN 325 MG PO TABS
650.0000 mg | ORAL_TABLET | Freq: Once | ORAL | Status: AC
  Administered 2017-01-29: 650 mg via ORAL
  Filled 2017-01-29: qty 2

## 2017-01-29 MED ORDER — SIROLIMUS 0.5 MG PO TABS
0.5000 mg | ORAL_TABLET | Freq: Every day | ORAL | Status: DC
  Administered 2017-01-30 – 2017-02-04 (×6): 0.5 mg via ORAL
  Filled 2017-01-29 (×6): qty 1

## 2017-01-29 MED ORDER — SODIUM CHLORIDE 0.9 % IV SOLN
1000.0000 mL | INTRAVENOUS | Status: DC
  Administered 2017-01-29 – 2017-01-30 (×2): 1000 mL via INTRAVENOUS

## 2017-01-29 MED ORDER — PREDNISOLONE 5 MG PO TABS: 10.0000 mg | ORAL_TABLET | Freq: Every day | ORAL | Status: DC

## 2017-01-29 MED ORDER — ACETAMINOPHEN 650 MG RE SUPP: 650.0000 mg | Freq: Four times a day (QID) | RECTAL | Status: DC | PRN

## 2017-01-29 MED ORDER — ACYCLOVIR 400 MG PO TABS
400.0000 mg | ORAL_TABLET | Freq: Every day | ORAL | Status: DC
  Administered 2017-01-30 – 2017-02-04 (×6): 400 mg via ORAL
  Filled 2017-01-29 (×7): qty 1

## 2017-01-29 MED ORDER — SODIUM CHLORIDE 0.9 % IV BOLUS (SEPSIS)
1000.0000 mL | Freq: Once | INTRAVENOUS | Status: AC
  Administered 2017-01-29: 1000 mL via INTRAVENOUS

## 2017-01-29 MED ORDER — HYDROMORPHONE HCL 2 MG PO TABS
2.0000 mg | ORAL_TABLET | ORAL | Status: DC | PRN
  Administered 2017-01-30 – 2017-02-04 (×16): 2 mg via ORAL
  Filled 2017-01-29 (×16): qty 1

## 2017-01-29 MED ORDER — FLUTICASONE PROPIONATE 50 MCG/ACT NA SUSP
1.0000 | Freq: Every day | NASAL | Status: DC
  Administered 2017-01-30 – 2017-02-04 (×6): 1 via NASAL
  Filled 2017-01-29 (×2): qty 16

## 2017-01-29 MED ORDER — ONDANSETRON HCL 4 MG/2ML IJ SOLN: 4.0000 mg | Freq: Four times a day (QID) | INTRAMUSCULAR | Status: DC | PRN

## 2017-01-29 MED ORDER — ACETAMINOPHEN 325 MG PO TABS
650.0000 mg | ORAL_TABLET | Freq: Four times a day (QID) | ORAL | Status: DC | PRN
  Administered 2017-02-02 – 2017-02-03 (×3): 650 mg via ORAL
  Filled 2017-01-29 (×3): qty 2

## 2017-01-29 MED ORDER — IOPAMIDOL (ISOVUE-370) INJECTION 76%
INTRAVENOUS | Status: AC
  Administered 2017-01-29: 100 mL
  Filled 2017-01-29: qty 100

## 2017-01-29 MED ORDER — VANCOMYCIN HCL IN DEXTROSE 1-5 GM/200ML-% IV SOLN: 1000.0000 mg | Freq: Once | INTRAVENOUS | Status: DC

## 2017-01-29 MED ORDER — ROPINIROLE HCL 1 MG PO TABS
3.0000 mg | ORAL_TABLET | Freq: Every day | ORAL | Status: DC
Start: 2017-01-29 — End: 2017-02-04
  Administered 2017-01-29 – 2017-02-03 (×6): 3 mg via ORAL
  Filled 2017-01-29 (×6): qty 3

## 2017-01-29 MED ORDER — PIPERACILLIN-TAZOBACTAM 3.375 G IVPB 30 MIN
3.3750 g | Freq: Once | INTRAVENOUS | Status: AC
Start: 2017-01-29 — End: 2017-01-29
  Administered 2017-01-29: 3.375 g via INTRAVENOUS
  Filled 2017-01-29: qty 50

## 2017-01-29 MED ORDER — POTASSIUM CHLORIDE CRYS ER 10 MEQ PO TBCR
10.0000 meq | EXTENDED_RELEASE_TABLET | Freq: Every day | ORAL | Status: DC
  Administered 2017-01-30: 10 meq via ORAL
  Filled 2017-01-29 (×2): qty 1

## 2017-01-29 MED ORDER — PIPERACILLIN-TAZOBACTAM 3.375 G IVPB
3.3750 g | Freq: Three times a day (TID) | INTRAVENOUS | Status: DC
  Administered 2017-01-29 – 2017-01-31 (×5): 3.375 g via INTRAVENOUS
  Filled 2017-01-29 (×5): qty 50

## 2017-01-29 MED ORDER — POLYVINYL ALCOHOL 1.4 % OP SOLN
1.0000 [drp] | OPHTHALMIC | Status: DC | PRN
  Administered 2017-02-01 – 2017-02-03 (×3): 1 [drp] via OPHTHALMIC
  Filled 2017-01-29: qty 15

## 2017-01-29 MED ORDER — MYCOPHENOLATE MOFETIL 500 MG PO TABS: 500.0000 mg | ORAL_TABLET | Freq: Two times a day (BID) | ORAL | Status: DC

## 2017-01-29 MED ORDER — POLYETHYL GLYCOL-PROPYL GLYCOL 0.4-0.3 % OP SOLN: 1.0000 [drp] | OPHTHALMIC | Status: DC | PRN

## 2017-01-29 MED ORDER — LEVALBUTEROL HCL 1.25 MG/0.5ML IN NEBU: 1.2500 mg | INHALATION_SOLUTION | Freq: Four times a day (QID) | RESPIRATORY_TRACT | Status: DC | PRN

## 2017-01-29 MED ORDER — VANCOMYCIN HCL IN DEXTROSE 1-5 GM/200ML-% IV SOLN
1000.0000 mg | Freq: Two times a day (BID) | INTRAVENOUS | Status: DC
  Administered 2017-01-30 (×3): 1000 mg via INTRAVENOUS
  Filled 2017-01-29 (×4): qty 200

## 2017-01-29 MED ORDER — FLUCONAZOLE 100 MG PO TABS
400.0000 mg | ORAL_TABLET | Freq: Every day | ORAL | Status: AC
  Administered 2017-01-30 – 2017-02-02 (×4): 400 mg via ORAL
  Filled 2017-01-29 (×5): qty 4

## 2017-01-29 NOTE — ED Notes (Signed)
Pharmacy tech at the bedside  Will check soon

## 2017-01-29 NOTE — ED Notes (Signed)
Please contact wife, [name], when pt is moved [phone]
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
## 2017-01-29 NOTE — Progress Notes (Signed)
Critical lab value Lactic acid 8.5. Called to [place]. New orders given. Will continue to follow.

## 2017-01-29 NOTE — ED Notes (Signed)
Patient and son states LLQ ecchymosis dark red light red from giving himself injections in the past and is he bumps into anything he easily bruises.

## 2017-01-29 NOTE — H&P (Signed)
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 the right lower lobe (axial image 192 of series 9), compatible with a nonocclusive embolus. No other larger more central filling defects are otherwise noted. Heart size is normal. There is no significant pericardial fluid, thickening or pericardial calcification. There is aortic atherosclerosis, as well as atherosclerosis of the great vessels of the mediastinum and the coronary arteries, including calcified atherosclerotic plaque in the right coronary artery. Right internal jugular double-lumen porta cath with tip terminating in the distal superior vena cava. Mediastinum/Nodes: No pathologically enlarged mediastinal or hilar lymph nodes. Esophagus is unremarkable in appearance. No axillary lymphadenopathy. Lungs/Pleura: Calcified granuloma in the superior segment of the right lower lobe. No other suspicious appearing pulmonary nodules or masses. No acute consolidative airspace disease. No pleural effusions. Musculoskeletal: Posttraumatic changes in the left chest wall where  there multiple old healed left-sided rib fractures, and postoperative changes, including cerclage wire  fixation of the left fifth and sixth ribs together. There are no acute displaced fractures or aggressive appearing lytic or blastic lesions noted in the visualized portions of the skeleton. Review of the MIP images confirms the above findings. CT ABDOMEN and PELVIS FINDINGS Hepatobiliary: 2.2 cm simple cyst in the posterior aspect of segment 2 of the liver. Other subcentimeter low-attenuation lesion in segment 4A, too small to characterize, but likely a tiny cyst. No other suspicious hepatic lesions. No intra or extrahepatic biliary ductal dilatation. Status post cholecystectomy. Pancreas: No pancreatic mass. No pancreatic ductal dilatation. No pancreatic or peripancreatic fluid or inflammatory changes. Spleen: Unremarkable. Adrenals/Urinary Tract: 7 mm nonobstructive calculus in the upper pole collecting system of left kidney. Right kidney and bilateral adrenal glands are normal in appearance. No hydroureteronephrosis. Urinary bladder is normal in appearance. Stomach/Bowel: Normal appearance of the stomach. No pathologic dilatation of small bowel or colon. Normal appendix. Vascular/Lymphatic: Aortic atherosclerosis, without evidence of aneurysm or dissection in the abdominal or pelvic vasculature. No lymphadenopathy noted in the abdomen or pelvis. Reproductive: Prostate gland and seminal vesicles are unremarkable in appearance. Other: No significant volume of ascites.  No pneumoperitoneum. Musculoskeletal: There are no aggressive appearing lytic or blastic lesions noted in the visualized portions of the skeleton. Old healed fractures of the left superior and inferior pubic rami. No acute displaced fractures are noted in the visualized portions of the skeleton. Review of the MIP images confirms the above findings. IMPRESSION: 1. No evidence of significant acute traumatic injury to the chest, abdomen or pelvis. 2.  Small nonocclusive subsegmental sized embolus in the right lower lobe, as above. This is of doubtful clinical significance. 3. 7 mm nonobstructive calculus in the upper pole collecting system of the left kidney. No ureteral stones or findings of urinary tract obstruction are noted at this time. 4. Aortic atherosclerosis, in addition to right coronary artery disease. Please note that although the presence of coronary artery calcium documents the presence of coronary artery disease, the severity of this disease and any potential stenosis cannot be assessed on this non-gated CT examination. Assessment for potential risk factor modification, dietary therapy or pharmacologic therapy may be warranted, if clinically indicated. 5. Old posttraumatic changes and postoperative changes, as above. Electronically Signed   By: Vinnie Langton M.D.   On: 01/29/2017 13:58    EKG: (Independently reviewed) sinus rhythm with a ventricular rate 98 bpm, QTC 504 ms, voltage criteria met for LVH, no acute ischemic changes  Assessment/Plan Principal Problem:   ?? Sepsis  -Patient presents with elevated lactic acid in the absence of source of infection.  Patient has orthostatic hypotension but otherwise is relatively hemodynamically stable.  He has no infectious symptoms such as cough, fevers or chills. -Patient is also immunocompromised on sirolimus so will empirically cover for sepsis of uncertain etiology -Influenza PCR negative-obtain a respiratory viral panel-history of sepsis related to parainfluenza virus last month -Empiric vancomycin/Zosyn -Procalcitonin -Continue to trend lactic acid noting improvement after hydration-suspect elevated lactate more reflective of poor perfusion in setting of recurrent orthostatic hypotension over period of several weeks since initiation of beta-blocker (see below) -Follow-up on urine culture and blood cultures  Active Problems:   Orthostatic hypotension -New problem and documented  today after patient had unwitnessed syncopal episode with focal seizure-like activity after episode -New start beta-blocker since discharge from [facility] in the past few weeks-hold low-dose Lopressor -Has received 3 L fluid-continue IV fluids at 125/hr -Hold Demadex -Mobilize with assistance -Continue to trend lactic  acid    Thrombocytopenia due to drugs -Patient presents with progressive thrombocytopenia in context of recent resumption of Xarelto -History of same in the past -Hold Xarelto -Follow platelets; if continues to drop may require platelet transfusion -No evidence of hyperbilirubinemia so doubt true hemolysis but for completeness of exam check LDH and haptoglobin    Anemia -Hemoglobin down to 8.8 from a baseline of around 10 -Review of oncology notes recommends transfuse only if hemoglobin less than 8 with symptoms or less than 7 without symptoms    Acute kidney injury  -Likely secondary to hypoperfusion in context of recurrent orthostatic hypotension -Hold diuretics and other offending medications -Continue fluids -Follow labs    COPD (chronic obstructive pulmonary disease)/BOOP/Chronic respiratory failure with hypoxia and hypercapnia  -On 4 L oxygen chronically -PCP has suggested the patient may benefit from nocturnal BiPAP in context of chronic hypercapnia -Once current lactic acidosis resolved consider obtaining ABG to determine if patient eligible for home BiPAP-has pulmonologist at [facility] who may also be helpful regarding ordering -Continue prednisone taper as prior to admission    History of chronic pulmonary embolism -Recent acute right lower lobe PE during hospitalization last month at [facility] and was resumed on Xarelto at time of discharge -Patient states Xarelto was due to be stopped in 4 days from now -CTA of chest here reveals clinically insignificant right lower lobe embolus and given significant thrombocytopenia anticoagulation has been  discontinued    Diabetes mellitus type 2, uncontrolled -Secondary to acute stressors and recent prednisone taper -Metformin on hold secondary to acute lactic acidosis and recent IV contrast -Obtain CK -SSI -HgbA1c    Chronic systolic heart failure  -Appears compensated and actually clinically dry -Last echo in 2016 with an EF of 35% -Echo this admission -Daily weights and strict I/O    History of Leukemia-lymphoma, T-cell, acute, HTLV-I-associated (s/p BMT 2011) -Has right chest Port-A-Cath in place -Last evaluated by oncology 11/29/16 -Continue sirolimus -Continue acyclovir and Diflucan for prophylaxis  (PCP made adjustments in some of these medications recently)       DVT prophylaxis: SCDs Code Status: Full Family Communication: Family at bedside Disposition Plan: Home Consults called: None    ELLIS,ALLISON L. ANP-BC [facility] Pager [phone]   If 7PM-7AM, please contact night-coverage [email or web address] Password [email or web address]  01/29/2017, 4:06 PM

## 2017-01-29 NOTE — Progress Notes (Addendum)
Addendum: SCr up at 1.73 from 1.13 earlier in month. CrCl~ 62 mL/min.   Plan:  Will continue Vancomycin at 1g IV every 12 hours after initial dose given.  Continue Zosyn at 3.375g IV every 8 hours - 4 hr infusion.   ____  Pharmacy Antibiotic Note  Derek Shepherd is a 61 y.o. male admitted on 01/29/2017 with sepsis.  Pharmacy has been consulted for Vancomycin and Zosyn dosing. WBC is within normal limits. Lactic acid 7.43. BP soft, HR 92.    Plan: Vancomycin 1500mg  IV x1 now.  Zosyn 3.375g IV over 30 minutes x1 now.  Follow-up lab work for further dosing.   Height: 5\' 6"  (167.6 cm) Weight: 195 lb (88.5 kg) IBW/kg (Calculated) : 63.8  No data recorded.  Recent Labs  Lab 01/23/17 1509 01/29/17 1128 01/29/17 1146  WBC 7.0 7.7  --   CREATININE 1.13  --   --   LATICACIDVEN  --   --  7.43*    Estimated Creatinine Clearance: 73.4 mL/min (by C-G formula based on SCr of 1.13 mg/dL).    Allergies  Allergen Reactions  . Nsaids Shortness Of Breath    Antimicrobials this admission: Vancomycin 11/25 >> Zosyn 11/25 >>  Dose adjustments this admission:   Microbiology results: 11/25 BCx:  11/25 UCx:   Thank you for allowing pharmacy to be a part of this patient's care.  Sloan Leiter, PharmD, BCPS, BCCCP Clinical Pharmacist Clinical phone 01/29/2017 until 3:30PM [phone] After hours, please call #[phone] 01/29/2017 12:01 PM

## 2017-01-29 NOTE — ED Notes (Signed)
Antibiotic started after first blood culture drawn.

## 2017-01-29 NOTE — ED Notes (Signed)
IV team called stated to ask Charge nurse to find out if someone is qualified to access port a cath.

## 2017-01-29 NOTE — ED Notes (Signed)
An attempt was made to call  His wife to tell her we were moving him  No answer and no answer machine pickup

## 2017-01-29 NOTE — ED Notes (Signed)
Pt states that his condom catheter "fell off" and is requesting a new one. Was suggested to use the urinal in the meantime. Pt also noted that the bruise on his back (R side ribs) was bleeding again.

## 2017-01-29 NOTE — ED Notes (Signed)
Milus Mallick PA shown results of repeat Lactic Acid. ED-lab,

## 2017-01-29 NOTE — ED Notes (Signed)
unsuccesssful attempt to call report I will call back

## 2017-01-29 NOTE — ED Notes (Signed)
Patient returned radiology
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
## 2017-01-29 NOTE — ED Notes (Signed)
The pts family thinks that the hematoma lt posterior chest is getting larger   He has some minimal bleeding from the hematoma on his rtlateral chest or abd  All from the fall earlier today

## 2017-01-29 NOTE — ED Notes (Signed)
Derek K. PA shown results of Lactic Acid. ED-Lab

## 2017-01-29 NOTE — ED Provider Notes (Signed)
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 considered in my medical decision making (see chart for details).     Patient with extensive medical history, recent admission for respiratory failure, viral URI, sepsis, pulmonary embolism.  Was doing relatively well up until this morning when he had a syncopal episode and possible seizure at home.  Upon EMS arrival patient is orthostatic but alert and oriented.  He had another syncopal episode upon transferring from EMS stretcher to the bed when he set up.  This lasted just a few seconds.  On my evaluation, patient alert and oriented.  He is not complaining of anything at this time.  He has multiple hematomas including to the abdomen, chest wall, bilateral shoulders, right neck.  He is vital signs are normal when he is laying down.  Will check labs, EKG, will image abdomen, chest, head and neck given on blood thinners, history of thrombocytopenia, with multiple large hematomas.  Will give IV fluids.  Patient does appear to be dry.  11:51 AM Lactic acid resulted at 7.43.  I will initiate  blood culture drawn and antibiotics.  Will order 30 cc/kg of IV fluids.  We will get a rectal temperature.  Will hold off on calling code sepsis at this time, since he has normal vital signs.  2:53 PM Vital signs remaining normal as long as patient is laying down or in a reclined position.  Tylenol was given for rectal temperature 101.  Repeat lactic  acid is improved at 5.  Platelets 44 . patient continues to [facility] well.  His urine analysis showed no signs of infection.  His repeat CT angios shows persistent nonocclusive right lower lobe pulmonary embolism, otherwise no findings to explain his symptoms.  CT abdomen pelvis with no acute findings.  I discussed patient with hospitalist who will admit.  Vitals:   01/29/17 1230 01/29/17 1315 01/29/17 1330 01/29/17 1345  BP: 121/87 133/77 125/85 133/77  Pulse: 90 85 80 81  Resp: 15 19 13 19   Temp:      TempSrc:      SpO2: 100% 100% 100% 100%  Weight:      Height:         Final Clinical Impressions(s) / ED Diagnoses   Final diagnoses:  Syncope and collapse  Seizure-like activity (HCC)  Sepsis, due to unspecified organism [facility])  Thrombocytopenia [facility])  Multiple bruises    ED Discharge Orders    None       Jeannett Senior, PA-C 01/29/17 2019    Virgel Manifold, MD 01/31/17 1017

## 2017-01-29 NOTE — ED Notes (Signed)
Admit provider at bedside 

## 2017-01-29 NOTE — ED Notes (Signed)
Report called  To britney on 4e

## 2017-01-29 NOTE — ED Notes (Signed)
Provider at bedside

## 2017-01-29 NOTE — Progress Notes (Signed)
Pt arrived to [place] completed. Vitals stable. Placed on telemetry. No complaints of pain.  Review plan of care with pt. Will continue to follow.

## 2017-01-30 ENCOUNTER — Encounter (HOSPITAL_COMMUNITY): Payer: Self-pay | Admitting: General Practice

## 2017-01-30 ENCOUNTER — Inpatient Hospital Stay (HOSPITAL_COMMUNITY): Payer: Medicare HMO

## 2017-01-30 ENCOUNTER — Other Ambulatory Visit: Payer: Self-pay

## 2017-01-30 DIAGNOSIS — I509 Heart failure, unspecified: Secondary | ICD-10-CM

## 2017-01-30 DIAGNOSIS — D649 Anemia, unspecified: Secondary | ICD-10-CM

## 2017-01-30 LAB — COMPREHENSIVE METABOLIC PANEL
ALBUMIN: 2.6 g/dL — AB (ref 3.5–5.0)
ALT: 30 U/L (ref 17–63)
AST: 37 U/L (ref 15–41)
Alkaline Phosphatase: 41 U/L (ref 38–126)
Anion gap: 8 (ref 5–15)
BUN: 29 mg/dL — ABNORMAL HIGH (ref 6–20)
CALCIUM: 8.2 mg/dL — AB (ref 8.9–10.3)
CHLORIDE: 98 mmol/L — AB (ref 101–111)
CO2: 35 mmol/L — AB (ref 22–32)
CREATININE: 1.28 mg/dL — AB (ref 0.61–1.24)
GFR calc Af Amer: >60 mL/min (ref >60)
GFR calc non Af Amer: 60 mL/min — ABNORMAL LOW (ref >60)
Glucose, Bld: 179 mg/dL — ABNORMAL HIGH (ref 65–99)
Potassium: 3.6 mmol/L (ref 3.5–5.1)
SODIUM: 141 mmol/L (ref 135–145)
Total Bilirubin: 0.8 mg/dL (ref 0.3–1.2)
Total Protein: 4.7 g/dL — ABNORMAL LOW (ref 6.5–8.1)

## 2017-01-30 LAB — HEMOGLOBIN A1C
Hgb A1c MFr Bld: 7.4 % — ABNORMAL HIGH (ref 4.8–5.6)
MEAN PLASMA GLUCOSE: 166 mg/dL

## 2017-01-30 LAB — CBC WITH DIFFERENTIAL/PLATELET
BASOS ABS: 0 10*3/uL (ref 0.0–0.1)
BASOS PCT: 0 %
EOS ABS: 0 10*3/uL (ref 0.0–0.7)
EOS PCT: 0 %
HCT: 18.7 % — ABNORMAL LOW (ref 39.0–52.0)
Hemoglobin: 5.8 g/dL — CL (ref 13.0–17.0)
LYMPHS PCT: 35 %
Lymphs Abs: 2.7 10*3/uL (ref 0.7–4.0)
MCH: 31.4 pg (ref 26.0–34.0)
MCHC: 31.6 g/dL (ref 30.0–36.0)
MCV: 99.5 fL (ref 78.0–100.0)
MONO ABS: 0.4 10*3/uL (ref 0.1–1.0)
Monocytes Relative: 6 %
Neutro Abs: 4.6 10*3/uL (ref 1.7–7.7)
Neutrophils Relative %: 59 %
PLATELETS: 33 10*3/uL — AB (ref 150–400)
RBC: 1.88 MIL/uL — AB (ref 4.22–5.81)
RDW: 17.5 % — AB (ref 11.5–15.5)
WBC: 7.8 10*3/uL (ref 4.0–10.5)

## 2017-01-30 LAB — BLOOD CULTURE ID PANEL (REFLEXED)
Acinetobacter baumannii: NOT DETECTED
CANDIDA ALBICANS: NOT DETECTED
CANDIDA GLABRATA: NOT DETECTED
CANDIDA TROPICALIS: NOT DETECTED
Candida krusei: NOT DETECTED
Candida parapsilosis: NOT DETECTED
ENTEROBACTER CLOACAE COMPLEX: NOT DETECTED
ENTEROBACTERIACEAE SPECIES: NOT DETECTED
ENTEROCOCCUS SPECIES: NOT DETECTED
Escherichia coli: NOT DETECTED
HAEMOPHILUS INFLUENZAE: NOT DETECTED
KLEBSIELLA PNEUMONIAE: NOT DETECTED
Klebsiella oxytoca: NOT DETECTED
LISTERIA MONOCYTOGENES: NOT DETECTED
Methicillin resistance: DETECTED — AB
NEISSERIA MENINGITIDIS: NOT DETECTED
Proteus species: NOT DETECTED
Pseudomonas aeruginosa: NOT DETECTED
STAPHYLOCOCCUS AUREUS BCID: NOT DETECTED
STAPHYLOCOCCUS SPECIES: DETECTED — AB
Serratia marcescens: NOT DETECTED
Streptococcus agalactiae: NOT DETECTED
Streptococcus pneumoniae: NOT DETECTED
Streptococcus pyogenes: NOT DETECTED
Streptococcus species: NOT DETECTED

## 2017-01-30 LAB — GLUCOSE, CAPILLARY
GLUCOSE-CAPILLARY: 148 mg/dL — AB (ref 65–99)
GLUCOSE-CAPILLARY: 152 mg/dL — AB (ref 65–99)
GLUCOSE-CAPILLARY: 167 mg/dL — AB (ref 65–99)
GLUCOSE-CAPILLARY: 187 mg/dL — AB (ref 65–99)
GLUCOSE-CAPILLARY: 240 mg/dL — AB (ref 65–99)
GLUCOSE-CAPILLARY: 272 mg/dL — AB (ref 65–99)

## 2017-01-30 LAB — CBC
HCT: 19.8 % — ABNORMAL LOW (ref 39.0–52.0)
Hemoglobin: 6.3 g/dL — CL (ref 13.0–17.0)
MCH: 31.8 pg (ref 26.0–34.0)
MCHC: 31.8 g/dL (ref 30.0–36.0)
MCV: 100 fL (ref 78.0–100.0)
PLATELETS: 31 10*3/uL — AB (ref 150–400)
RBC: 1.98 MIL/uL — AB (ref 4.22–5.81)
RDW: 17.3 % — ABNORMAL HIGH (ref 11.5–15.5)
WBC: 8.6 10*3/uL (ref 4.0–10.5)

## 2017-01-30 LAB — LACTIC ACID, PLASMA
LACTIC ACID, VENOUS: 3.1 mmol/L — AB (ref 0.5–1.9)
LACTIC ACID, VENOUS: 4.5 mmol/L — AB (ref 0.5–1.9)

## 2017-01-30 LAB — URINE CULTURE

## 2017-01-30 LAB — ECHOCARDIOGRAM COMPLETE
Ao-asc: 33 cm
CHL CUP MV DEC (S): 246
E decel time: 246 msec
FS: 36 % (ref 28–44)
HEIGHTINCHES: 66 in
IV/PV OW: 1.15
LA diam index: 1.55 cm/m2
LASIZE: 32 mm
LEFT ATRIUM END SYS DIAM: 32 mm
LVOT area: 4.91 cm2
LVOT diameter: 25 mm
MV pk A vel: 97.5 m/s
MV pk E vel: 51.4 m/s
PW: 10.3 mm — AB (ref 0.6–1.1)
Weight: 3121.71 oz

## 2017-01-30 LAB — PREPARE RBC (CROSSMATCH)

## 2017-01-30 LAB — HIV ANTIBODY (ROUTINE TESTING W REFLEX): HIV SCREEN 4TH GENERATION: NONREACTIVE

## 2017-01-30 LAB — HAPTOGLOBIN: Haptoglobin: 205 mg/dL — ABNORMAL HIGH (ref 34–200)

## 2017-01-30 MED ORDER — PERFLUTREN LIPID MICROSPHERE
INTRAVENOUS | Status: AC
  Administered 2017-01-30: 12:00:00
  Filled 2017-01-30: qty 10

## 2017-01-30 MED ORDER — SODIUM CHLORIDE 0.9% FLUSH
10.0000 mL | INTRAVENOUS | Status: DC | PRN
  Administered 2017-02-03: 30 mL
  Filled 2017-01-30: qty 40

## 2017-01-30 MED ORDER — SODIUM CHLORIDE 0.9 % IV SOLN
Freq: Once | INTRAVENOUS | Status: AC
  Administered 2017-01-30: 13:00:00 via INTRAVENOUS

## 2017-01-30 MED ORDER — PERFLUTREN LIPID MICROSPHERE
1.0000 mL | INTRAVENOUS | Status: DC | PRN
  Administered 2017-01-30: 3 mL via INTRAVENOUS
  Filled 2017-01-30: qty 10

## 2017-01-30 MED ORDER — SODIUM CHLORIDE 0.9 % IV SOLN
1000.0000 mL | INTRAVENOUS | Status: DC
  Administered 2017-01-30 – 2017-02-03 (×5): 1000 mL via INTRAVENOUS

## 2017-01-30 MED ORDER — CYCLOBENZAPRINE HCL 10 MG PO TABS
5.0000 mg | ORAL_TABLET | Freq: Once | ORAL | Status: AC
  Administered 2017-01-30: 5 mg via ORAL
  Filled 2017-01-30: qty 1

## 2017-01-30 NOTE — Progress Notes (Signed)
PHARMACY - PHYSICIAN COMMUNICATION CRITICAL VALUE ALERT - BLOOD CULTURE IDENTIFICATION (BCID)  Results for orders placed or performed during the hospital encounter of 01/29/17  Blood Culture ID Panel (Reflexed) (Collected: 01/29/2017 12:22 PM)  Result Value Ref Range   Enterococcus species NOT DETECTED NOT DETECTED   Listeria monocytogenes NOT DETECTED NOT DETECTED   Staphylococcus species DETECTED (A) NOT DETECTED   Staphylococcus aureus NOT DETECTED NOT DETECTED   Methicillin resistance DETECTED (A) NOT DETECTED   Streptococcus species NOT DETECTED NOT DETECTED   Streptococcus agalactiae NOT DETECTED NOT DETECTED   Streptococcus pneumoniae NOT DETECTED NOT DETECTED   Streptococcus pyogenes NOT DETECTED NOT DETECTED   Acinetobacter baumannii NOT DETECTED NOT DETECTED   Enterobacteriaceae species NOT DETECTED NOT DETECTED   Enterobacter cloacae complex NOT DETECTED NOT DETECTED   Escherichia coli NOT DETECTED NOT DETECTED   Klebsiella oxytoca NOT DETECTED NOT DETECTED   Klebsiella pneumoniae NOT DETECTED NOT DETECTED   Proteus species NOT DETECTED NOT DETECTED   Serratia marcescens NOT DETECTED NOT DETECTED   Haemophilus influenzae NOT DETECTED NOT DETECTED   Neisseria meningitidis NOT DETECTED NOT DETECTED   Pseudomonas aeruginosa NOT DETECTED NOT DETECTED   Candida albicans NOT DETECTED NOT DETECTED   Candida glabrata NOT DETECTED NOT DETECTED   Candida krusei NOT DETECTED NOT DETECTED   Candida parapsilosis NOT DETECTED NOT DETECTED   Candida tropicalis NOT DETECTED NOT DETECTED    Name of physician (or Provider) Contacted: Dr Cathlean Sauer  Changes to prescribed antibiotics required: None already on zosyn vanc likely contaminant  Levester Fresh, PharmD, BCPS, BCCCP Clinical Pharmacist Clinical phone for 01/30/2017 from 7a-3:30p: [phone] If after 3:30p, please call main pharmacy at: [phone] 01/30/2017 8:49 AM

## 2017-01-30 NOTE — Progress Notes (Signed)
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 evidence of aneurysm or dissection in the abdominal or pelvic vasculature. No lymphadenopathy noted in the abdomen or pelvis. Reproductive: Prostate gland and seminal vesicles are unremarkable in appearance. Other: No significant volume of ascites.  No pneumoperitoneum. Musculoskeletal: There are no aggressive appearing lytic or blastic lesions noted in the visualized portions of the skeleton. Old healed fractures of the left superior and inferior pubic rami. No acute displaced fractures are noted in the visualized portions of the skeleton. Review of the MIP images confirms the above findings. IMPRESSION: 1. No evidence of significant acute traumatic injury to the chest, abdomen or pelvis. 2. Small nonocclusive subsegmental sized embolus in the right lower lobe, as above. This is of doubtful clinical significance. 3. 7 mm nonobstructive calculus in the upper pole collecting system of the left kidney. No ureteral stones or findings of urinary tract obstruction are noted at this time. 4. Aortic atherosclerosis, in addition to right coronary artery disease.  Please note that although the presence of coronary artery calcium documents the presence of coronary artery disease, the severity of this disease and any potential stenosis cannot be assessed on this non-gated CT examination. Assessment for potential risk factor modification, dietary therapy or pharmacologic therapy may be warranted, if clinically indicated. 5. Old posttraumatic changes and postoperative changes, as above. Electronically Signed   By: Vinnie Langton M.D.   On: 01/29/2017 13:58   Dg Chest Port 1 View  Result Date: 01/29/2017 CLINICAL DATA:  Shortness of breath tonight EXAM: PORTABLE CHEST 1 VIEW COMPARISON:  01/29/2017 FINDINGS: Shallow inspiration. Heart size and pulmonary vascularity are normal. Right central venous catheter with tip over the cavoatrial junction region. No airspace disease or consolidation in the lungs. No blunting of costophrenic angles. No pneumothorax. Postoperative changes in the left ribs. IMPRESSION: No active disease. Electronically Signed   By: Lucienne Capers M.D.   On: 01/29/2017 23:08        Scheduled Meds: . acyclovir  400 mg Oral Daily  . fluconazole  400 mg Oral Daily  . fluticasone  1 spray Each Nare Daily  . folic acid  1 mg Oral Daily  . insulin aspart  0-15 Units Subcutaneous Q4H  . LORazepam  1 mg Oral QHS  . mometasone-formoterol  2 puff Inhalation BID  . multivitamin with minerals  1 tablet Oral Daily  . nefazodone  50 mg Oral BID  . potassium chloride  10 mEq Oral Daily  . predniSONE  10 mg Oral Q breakfast  . rOPINIRole  3 mg Oral QHS  . Sirolimus  0.5 mg Oral Daily  . tiotropium  18 mcg Inhalation Daily   Continuous Infusions: . sodium chloride 1,000 mL (01/30/17 0055)  . piperacillin-tazobactam (ZOSYN)  IV Stopped (01/30/17 0831)  . vancomycin Stopped (01/30/17 0406)     LOS: 1 day      Tanzania Hall-Potvin, PA-S [facility] Pager [phone]  If 7PM-7AM, please contact  night-coverage [email or web address] Password [facility] 01/30/2017, 8:51 AM

## 2017-01-30 NOTE — Progress Notes (Signed)
Critical lab value  Lactic acid 4.5. Paged Hal Hope. New orders given. Will continue to follow.

## 2017-01-30 NOTE — Progress Notes (Signed)
CRITICAL VALUE ALERT  Critical Value:  Hemoglobin 6.3, and Lactic Acid : 3.1   Date & Time Notied:  0340  Provider Notified: Dr. Hal Hope  Orders Received/Actions taken:

## 2017-01-30 NOTE — Progress Notes (Signed)
PROGRESS NOTE    ULUS HAZEN  JKK:[id number] DOB: [birth year] DOA: 01/29/2017 PCP: Claretta Fraise, MD    Brief Narrative:  61 year old male who presented with a syncope episode. 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 On the day of admission he experienced a syncopal episode while standing in the bathroom and urinating, positive head trauma. Brief loss of consciousness. Apparently he has been experiencing falls recently. On his initial physical examination blood pressure 117/81, heart rate 79, temperature 101, respiratory 16, oxygenation 100%. He was noted to have positive orthostatic vital signs. His mucous membranes were dry, his lungs were clear to auscultation bilaterally, heart S1-S2 present rhythmic, no gallops, rubs or murmurs, the abdomen was protuberant, nontender, trace lower extremity edema. Arterial blood gas 7.46/41.4/125/29.2/98%. Sodium 141, potassium 3.6, chloride 98, bicarbonate 35, glucose 179, BUN 29, creatinine 1.28, venous lactic acid 8.5, white count 8.6, hemoglobin 6.3, hematocrit 19.8, platelets 31, urinalysis negative for infection. Head and neck CT with no acute findings. Chest film hypoinflated, right rotation, no infiltrates, right internal jugular vein catheter. CT of the abdomen with no acute changes. EKG sinus rhythm, 98 beats per minute, normal axis, normal intervals.  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  Assessment & Plan:   Principal  Problem:   Sepsis ([place]) Active Problems:   Orthostatic hypotension   Thrombocytopenia due to drugs   COPD (chronic obstructive pulmonary disease)/BOOP   Chronic respiratory failure with hypoxia and hypercapnia (HCC)   History of chronic pulmonary embolism   Chronic systolic heart failure (HCC)   Anemia   History of Leukemia-lymphoma, T-cell, acute, HTLV-I-associated (s/p BMT 2011)   Diabetes mellitus type 2, uncontrolled ([place])   Acute kidney injury ([place])   1. Syncope due to symptomatic anemia. Patient will get 2 units of prbc due to symptomatic anemia, radiated units, will follow on h&H post transfusion, no signs of bleeding. Anemia multifactorial, may be related to marrow suppression due to sirolimus or graft vs host disease (chronic), no clinical signs of hemolysis, will follow on haptoglobin. Noted multiple echymosis.   2. Chronic graft-versus-host disease, status post bone marrow transplant for ALL.  Thrombocytopenia and anemia, will continue close monitoring of cell count, I called [facility] but not able to contact oncology clinic. Will continue sirolimus for now. Patient is at high risk for infections, will continue broad spectrum antibiotic for now and will continue to follow on cultures, cell count and temperature curve. Continue fluconazole and acyclovir  3. Acute kidney injury. Likely pre-renal, patient has been on high rate of isotonic saline, will continue volume resuscitation with PRBC and will reduce saline to 50 cc per hour, to prevent volume overload.   4. COPD with chronic hypoxemic respiratory failure. Will continue supplemental 02 per [place], continue oxymetry monitoring, continue levalbuterol, dulera, tiotropium, prednisone,   5. Anxiety/ depression. Continue clonazepam, nefazedone  DVT prophylaxis: scd  Code Status: Full Family Communication:  No family at the bedside  Disposition Plan: home   Consultants:     Procedures:     Antimicrobials:        Subjective: Patient feeling  very weak and deconditioned, no nausea or vomiting. Positive dyspnea but no chest pain. Last prbc transfusion about 2 mo ago.   Objective: Vitals:   01/30/17 1310 01/30/17 1341 01/30/17 1500 01/30/17 1525  BP: (!) 156/77 (!) 147/82 (!) 146/89   Pulse: (!) 105 (!) 103 82 88  Resp: 18   19  Temp: 98.6 F (37 C) 97.9 F (36.6 C)    TempSrc: Oral Oral    SpO2: 93% 99% 100% 100%  Weight:      Height:        Intake/Output Summary (Last 24 hours) at 01/30/2017 1543 Last data filed at 01/30/2017 1443 Gross per 24 hour  Intake 1766.5 ml  Output 1701 ml  Net 65.5 ml   Filed Weights   01/29/17 1120 01/30/17 0600  Weight: 88.5 kg (195 lb) 88.5 kg (195 lb 1.7 oz)    Examination:   General: deconditioned and ill looking apparing Neurology: Awake and alert, non focal  E ENT: positive pallor, no icterus, oral mucosa moist Cardiovascular: No JVD. S1-S2 present, rhythmic, no gallops, rubs, or murmurs. Trace lower extremity edema. Pulmonary: decreased breath sounds bilaterally, poor air movement, no wheezing, rhonchi or rales. Gastrointestinal. Abdomen protuberant, no organomegaly, non tender, no rebound or guarding Skin. No rashes Musculoskeletal: no joint deformities     Data Reviewed: I have personally reviewed following labs and imaging studies  CBC: Recent Labs  Lab 01/29/17 1128 01/30/17 0224 01/30/17 0546  WBC 7.7 8.6 7.8  NEUTROABS 5.5  --  4.6  HGB 8.8* 6.3* 5.8*  HCT 28.1* 19.8* 18.7*  MCV 103.3* 100.0 99.5  PLT 44* 31* 33*   Basic Metabolic Panel: Recent Labs  Lab 01/29/17 1128 01/30/17 0224  NA 135 141  K 4.0 3.6  CL 88* 98*  CO2 33* 35*  GLUCOSE 253* 179*  BUN 39* 29*  CREATININE 1.32* 1.28*  CALCIUM 9.1 8.2*   GFR: Estimated Creatinine Clearance: 64.8 mL/min (A) (by C-G formula based on SCr of 1.28 mg/dL (H)). Liver Function Tests: Recent Labs  Lab 01/29/17 1128 01/30/17 0224  AST 36 37  ALT 35 30  ALKPHOS  53 41  BILITOT 0.7 0.8  PROT 5.9* 4.7*  ALBUMIN 3.0* 2.6*   No results for input(s): LIPASE, AMYLASE in the last 168 hours. No results for input(s): AMMONIA in the last 168 hours. Coagulation Profile: Recent Labs  Lab 01/29/17 1128  INR 2.33   Cardiac Enzymes: Recent Labs  Lab 01/29/17 1610  CKTOTAL 35*   BNP (last 3 results) No results for input(s): PROBNP in the last 8760 hours. HbA1C: Recent Labs    01/29/17 1610  HGBA1C 7.4*   CBG: Recent Labs  Lab 01/29/17 2112 01/30/17 0013 01/30/17 0401 01/30/17 0809 01/30/17 1205  GLUCAP 306* 167* 152* 148* 272*   Lipid Profile: No results for input(s): CHOL, HDL, LDLCALC, TRIG, CHOLHDL, LDLDIRECT in the last 72 hours. Thyroid Function Tests: No results for input(s): TSH, T4TOTAL, FREET4, T3FREE, THYROIDAB in the last 72 hours. Anemia Panel: No results for input(s): VITAMINB12, FOLATE, FERRITIN, TIBC, IRON, RETICCTPCT in the last 72 hours.    Radiology Studies: I have reviewed all of the imaging during this hospital visit personally     Scheduled Meds: . acyclovir  400 mg Oral Daily  . fluconazole  400 mg Oral Daily  . fluticasone  1 spray Each Nare Daily  . folic acid  1 mg Oral Daily  . insulin aspart  0-15 Units Subcutaneous Q4H  .  LORazepam  1 mg Oral QHS  . mometasone-formoterol  2 puff Inhalation BID  . multivitamin with minerals  1 tablet Oral Daily  . nefazodone  50 mg Oral BID  . potassium chloride  10 mEq Oral Daily  . predniSONE  10 mg Oral Q breakfast  . rOPINIRole  3 mg Oral QHS  . Sirolimus  0.5 mg Oral Daily  . tiotropium  18 mcg Inhalation Daily   Continuous Infusions: . sodium chloride 1,000 mL (01/30/17 0055)  . piperacillin-tazobactam (ZOSYN)  IV 3.375 g (01/30/17 1232)  . vancomycin Stopped (01/30/17 1512)     LOS: 1 day        Mauricio Gerome Apley, MD [facility] Pager [phone]

## 2017-01-30 NOTE — Progress Notes (Signed)
  Echocardiogram 2D Echocardiogram has been performed.  Derek Shepherd 01/30/2017, 12:29 PM

## 2017-01-31 DIAGNOSIS — D89811 Chronic graft-versus-host disease: Secondary | ICD-10-CM

## 2017-01-31 LAB — BASIC METABOLIC PANEL
ANION GAP: 6 (ref 5–15)
BUN: 13 mg/dL (ref 6–20)
CHLORIDE: 99 mmol/L — AB (ref 101–111)
CO2: 31 mmol/L (ref 22–32)
Calcium: 8.5 mg/dL — ABNORMAL LOW (ref 8.9–10.3)
Creatinine, Ser: 0.86 mg/dL (ref 0.61–1.24)
Glucose, Bld: 129 mg/dL — ABNORMAL HIGH (ref 65–99)
POTASSIUM: 3.3 mmol/L — AB (ref 3.5–5.1)
SODIUM: 136 mmol/L (ref 135–145)

## 2017-01-31 LAB — CBC WITH DIFFERENTIAL/PLATELET
BASOS ABS: 0 10*3/uL (ref 0.0–0.1)
BASOS PCT: 0 %
EOS ABS: 0.1 10*3/uL (ref 0.0–0.7)
Eosinophils Relative: 1 %
HCT: 24.1 % — ABNORMAL LOW (ref 39.0–52.0)
HEMOGLOBIN: 8.3 g/dL — AB (ref 13.0–17.0)
Lymphocytes Relative: 33 %
Lymphs Abs: 2.2 10*3/uL (ref 0.7–4.0)
MCH: 31.8 pg (ref 26.0–34.0)
MCHC: 34.4 g/dL (ref 30.0–36.0)
MCV: 92.3 fL (ref 78.0–100.0)
MONO ABS: 0.5 10*3/uL (ref 0.1–1.0)
MONOS PCT: 7 %
NEUTROS PCT: 59 %
Neutro Abs: 3.9 10*3/uL (ref 1.7–7.7)
Platelets: 24 10*3/uL — CL (ref 150–400)
RBC: 2.61 MIL/uL — ABNORMAL LOW (ref 4.22–5.81)
RDW: 18.5 % — AB (ref 11.5–15.5)
WBC: 6.6 10*3/uL (ref 4.0–10.5)

## 2017-01-31 LAB — GLUCOSE, CAPILLARY
GLUCOSE-CAPILLARY: 101 mg/dL — AB (ref 65–99)
GLUCOSE-CAPILLARY: 131 mg/dL — AB (ref 65–99)
GLUCOSE-CAPILLARY: 229 mg/dL — AB (ref 65–99)
GLUCOSE-CAPILLARY: 99 mg/dL (ref 65–99)
Glucose-Capillary: 253 mg/dL — ABNORMAL HIGH (ref 65–99)
Glucose-Capillary: 293 mg/dL — ABNORMAL HIGH (ref 65–99)

## 2017-01-31 MED ORDER — POTASSIUM CHLORIDE CRYS ER 20 MEQ PO TBCR
40.0000 meq | EXTENDED_RELEASE_TABLET | Freq: Once | ORAL | Status: AC
  Administered 2017-01-31: 40 meq via ORAL
  Filled 2017-01-31: qty 2

## 2017-01-31 MED ORDER — SULFAMETHOXAZOLE-TRIMETHOPRIM 400-80 MG PO TABS
1.0000 | ORAL_TABLET | ORAL | Status: DC
  Administered 2017-02-01 – 2017-02-03 (×2): 1 via ORAL
  Filled 2017-01-31 (×2): qty 1

## 2017-01-31 MED ORDER — RIVAROXABAN 15 MG PO TABS: 15.0000 mg | ORAL_TABLET | Freq: Every day | ORAL | Status: DC

## 2017-01-31 MED ORDER — CITALOPRAM HYDROBROMIDE 20 MG PO TABS
40.0000 mg | ORAL_TABLET | Freq: Every day | ORAL | Status: DC
  Administered 2017-02-03 – 2017-02-04 (×2): 40 mg via ORAL
  Filled 2017-01-31 (×2): qty 2

## 2017-01-31 MED ORDER — CITALOPRAM HYDROBROMIDE 20 MG PO TABS
20.0000 mg | ORAL_TABLET | Freq: Every day | ORAL | Status: AC
  Administered 2017-01-31 – 2017-02-02 (×3): 20 mg via ORAL
  Filled 2017-01-31 (×3): qty 1

## 2017-01-31 NOTE — Progress Notes (Signed)
Patient refuses NIV at this time, no respiratory distress noted, RCP will continue to monitor.

## 2017-01-31 NOTE — Progress Notes (Addendum)
PROGRESS NOTE    Derek Shepherd  LKG:[id number] DOB: 1955-06-01 DOA: 01/29/2017 PCP: Claretta Fraise, MD    Brief Narrative:  61 year old male who presented with a syncope episode. 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 On the day of admission he experienced a syncopal episode while standing in the bathroom and urinating, positive head trauma. Brief loss of consciousness. Apparently he has been experiencing falls recently. On his initial physical examination blood pressure 117/81, heart rate 79, temperature 101, respiratory 16, oxygenation 100%. He was noted to have positive orthostatic vital signs. His mucous membranes were dry, his lungs were clear to auscultation bilaterally, heart S1-S2 present rhythmic, no gallops, rubs or murmurs, the abdomen was protuberant, nontender, trace lower extremity edema. Arterial blood gas 7.46/41.4/125/29.2/98%. Sodium 141, potassium 3.6, chloride 98, bicarbonate 35, glucose 179, BUN 29, creatinine 1.28, venous lactic acid 8.5, white count 8.6, hemoglobin 6.3, hematocrit 19.8, platelets 31, urinalysis negative for infection. Head and neck CT with no acute findings. Chest film hypoinflated, right rotation, no infiltrates, right internal jugular vein catheter. CT of the abdomen with no acute changes. EKG sinus rhythm, 98 beats per minute, normal axis, normal intervals.  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  Assessment & Plan:   Principal  Problem:   Sepsis ([place]) Active Problems:   Orthostatic hypotension   Thrombocytopenia due to drugs   COPD (chronic obstructive pulmonary disease)/BOOP   Chronic respiratory failure with hypoxia and hypercapnia (HCC)   History of chronic pulmonary embolism   Chronic systolic heart failure (HCC)   Anemia   History of Leukemia-lymphoma, T-cell, acute, HTLV-I-associated (s/p BMT 2011)   Diabetes mellitus type 2, uncontrolled ([place])   Acute kidney injury ([place])  1. Syncope due to symptomatic anemia. Hb and Hct stable, after 2 units of prbc, will have physical therapy evaluation and have patient out of bed to the chair. No signs of active bleeding. No signs of infection, will dc antibiotic therapy.  2. 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  On Sirolimus. Old records personally reviewed from [facility] : Bone marrow 04/12/16 evidenced hypocellular marrow (20%) with dysmegakaryopoiesis and no increase in blasts (<2%). Suggesting some degree of marrow failure, patient had recent PRBC transfusion. I was able to contact Hematology Clinic at [facility], I spoke with Dr. Myles Rosenthal who agree with PRBC transfusion and supportive medical therapy, continue Sirolimus, will need follow up with Dr. Norma Fredrickson at [facility]. Will continue acyclovir, complete course of fluconazole (5 doses), and resume prophylactic Bactrim (M-W-F).   3. Acute kidney injury with hypokalemia. Renal function has improved after prbc transfusion and gentle hydration, will continue to follow renal function in am, continue slow rate of isotonic saline. Serum cr at 0.86 with K at 3,3 and Na at 136. Will replete K with kcl.   4. COPD with chronic hypoxemic respiratory failure. Supplemental 02 per [place],  oxymetry monitoring. On levalbuterol, dulera, tiotropium. No signs of volume overload. Complete prednisone taper.   5. Anxiety/ depression. On citalopram, clonazepam, nefazedone with good toleration.  qhs lorazepam.   6. T2DM. Continue  insulin sliding scale for glucose cover and monitoring.   DVT prophylaxis: scd  Code Status: Full Family Communication:  No family at the bedside  Disposition Plan: home   Consultants:     Procedures:     Antimicrobials:       Subjective: Patient feeling better after prbc transfusion, but persistent weakness. No dyspnea or chest pain. No nausea or vomiting. Positive feet pain bilaterally.   Objective: Vitals:   01/30/17 2100 01/30/17 2300 01/31/17 0000 01/31/17 0404  BP: (!) 160/86 (!) 171/86 (!) 166/83 (!) 154/78  Pulse: 77 79 81 82  Resp: (!) 24 (!) 24 (!) 21   Temp: 99.1 F (37.3 C) 99 F (37.2 C)  98.6 F (37 C)  TempSrc: Oral Oral  Oral  SpO2: 100% 100% 100% 97%  Weight:    92.4 kg (203 lb 11.3 oz)  Height:        Intake/Output Summary (Last 24 hours) at 01/31/2017 0932 Last data filed at 01/31/2017 0349 Gross per 24 hour  Intake 2288.16 ml  Output 1781 ml  Net 507.16 ml   Filed Weights   01/29/17 1120 01/30/17 0600 01/31/17 0404  Weight: 88.5 kg (195 lb) 88.5 kg (195 lb 1.7 oz) 92.4 kg (203 lb 11.3 oz)    Examination:   General: Not in pain or dyspnea, deconditioned Neurology: Awake and alert, non focal  E ENT: positive pallor, no icterus, oral mucosa moist Cardiovascular: No JVD. S1-S2 present, rhythmic, no gallops, rubs, or murmurs. No lower extremity edema. Pulmonary: decreased breath sounds bilaterally at bases, adequate air movement, no wheezing, rhonchi or rales. Gastrointestinal. Abdomen protuberant, no organomegaly, non tender, no rebound or guarding Skin. Multiple ecchymosis at the flanks, and upper extremities. Positive ecchymosis at the right first toe.  Musculoskeletal: no joint deformities     Data Reviewed: I have personally reviewed following labs and imaging studies  CBC: Recent Labs  Lab 01/29/17 1128 01/30/17 0224 01/30/17 0546 01/31/17 0416  WBC 7.7 8.6 7.8 6.6  NEUTROABS  5.5  --  4.6 3.9  HGB 8.8* 6.3* 5.8* 8.3*  HCT 28.1* 19.8* 18.7* 24.1*  MCV 103.3* 100.0 99.5 92.3  PLT 44* 31* 33* 24*   Basic Metabolic Panel: Recent Labs  Lab 01/29/17 1128 01/30/17 0224 01/31/17 0416  NA 135 141 136  K 4.0 3.6 3.3*  CL 88* 98* 99*  CO2 33* 35* 31  GLUCOSE 253* 179* 129*  BUN 39* 29* 13  CREATININE 1.32* 1.28* 0.86  CALCIUM 9.1 8.2* 8.5*   GFR: Estimated Creatinine Clearance: 98.4 mL/min (by C-G formula based on SCr of 0.86 mg/dL). Liver Function Tests: Recent Labs  Lab 01/29/17 1128 01/30/17 0224  AST 36 37  ALT 35 30  ALKPHOS 53 41  BILITOT 0.7 0.8  PROT 5.9* 4.7*  ALBUMIN 3.0* 2.6*   No results for input(s): LIPASE, AMYLASE in the last 168 hours. No results for input(s): AMMONIA in the last 168 hours. Coagulation Profile: Recent Labs  Lab 01/29/17 1128  INR 2.33   Cardiac Enzymes: Recent Labs  Lab 01/29/17 1610  CKTOTAL 35*   BNP (last 3 results) No results for input(s): PROBNP in the last 8760 hours. HbA1C: Recent Labs    01/29/17 1610  HGBA1C 7.4*   CBG: Recent Labs  Lab 01/30/17 1205 01/30/17 1619 01/30/17 2113 01/31/17 0412 01/31/17 0807  GLUCAP 272* 240* 187* 131* 101*   Lipid Profile: No results for input(s): CHOL, HDL, LDLCALC, TRIG, CHOLHDL, LDLDIRECT in the last 72 hours.  Thyroid Function Tests: No results for input(s): TSH, T4TOTAL, FREET4, T3FREE, THYROIDAB in the last 72 hours. Anemia Panel: No results for input(s): VITAMINB12, FOLATE, FERRITIN, TIBC, IRON, RETICCTPCT in the last 72 hours.    Radiology Studies: I have reviewed all of the imaging during this hospital visit personally     Scheduled Meds: . acyclovir  400 mg Oral Daily  . citalopram  20 mg Oral Daily   Followed by  . [START ON 02/03/2017] citalopram  40 mg Oral Daily  . fluconazole  400 mg Oral Daily  . fluticasone  1 spray Each Nare Daily  . folic acid  1 mg Oral Daily  . insulin aspart  0-15 Units Subcutaneous Q4H  . LORazepam   1 mg Oral QHS  . mometasone-formoterol  2 puff Inhalation BID  . multivitamin with minerals  1 tablet Oral Daily  . nefazodone  50 mg Oral BID  . predniSONE  10 mg Oral Q breakfast  . rOPINIRole  3 mg Oral QHS  . Sirolimus  0.5 mg Oral Daily  . tiotropium  18 mcg Inhalation Daily   Continuous Infusions: . sodium chloride 1,000 mL (01/31/17 0428)  . piperacillin-tazobactam (ZOSYN)  IV 3.375 g (01/31/17 0411)  . vancomycin Stopped (01/31/17 0052)     LOS: 2 days        Bernice Mcauliffe Gerome Apley, MD [facility] Pager [phone]

## 2017-01-31 NOTE — Care Management Note (Signed)
Case Management Note Marvetta Gibbons RN, BSN Unit 4E-Case Manager [phone]  Patient Details  Name: ESTEPHAN GALLARDO MRN: [id number] Date of Birth: [birth year]  Subjective/Objective:   Pt admitted with sepsis, with fall at home                  Action/Plan: PTA pt lived at home with spouse- baseline home 02 4L PT eval pending- PCP- Claretta Fraise  CM will follow for PT recommendations-   Expected Discharge Date:                  Expected Discharge Plan:  [place]  In-House Referral:     Discharge planning Services  CM Consult  Post Acute Care Choice:    Choice offered to:     DME Arranged:    DME Agency:     HH Arranged:    [place] Agency:     Status of Service:  In process, will continue to follow  If discussed at Long Length of Stay Meetings, dates discussed:    Discharge Disposition:   Additional Comments:  Dawayne Patricia, RN 01/31/2017, 2:52 PM

## 2017-01-31 NOTE — Progress Notes (Signed)
Inpatient Diabetes Program Recommendations  AACE/ADA: New Consensus Statement on Inpatient Glycemic Control (2015)  Target Ranges:  Prepandial:   less than 140 mg/dL      Peak postprandial:   less than 180 mg/dL (1-2 hours)      Critically ill patients:  140 - 180 mg/dL   Lab Results  Component Value Date   GLUCAP 229 (H) 01/31/2017   HGBA1C 7.4 (H) 01/29/2017    Review of Glycemic Control Results for FAVIO, MODER (MRN [id number]) as of 01/31/2017 13:16  Ref. Range 01/30/2017 16:19 01/30/2017 21:13 01/31/2017 04:12 01/31/2017 08:07 01/31/2017 11:41  Glucose-Capillary Latest Ref Range: 65 - 99 mg/dL 240 (H) 187 (H) 131 (H) 101 (H) 229 (H)   Diabetes history: DM2 Outpatient Diabetes medications: Metformin 500 mg bid Current orders for Inpatient glycemic control: Novolog correction moderate q 4 hrs.  Inpatient Diabetes Program Recommendations:  While Metformin held and on steroids, please consider: -Lantus 15 units daily -Change Novolog correction to tid & hs 0-5 units  Thank you, Bethena Roys E. Carlosdaniel Grob, RN, MSN, CDE  Diabetes Coordinator Inpatient Glycemic Control Team Team Pager [phone] (8am-5pm) 01/31/2017 1:18 PM

## 2017-02-01 DIAGNOSIS — I951 Orthostatic hypotension: Secondary | ICD-10-CM

## 2017-02-01 DIAGNOSIS — A419 Sepsis, unspecified organism: Principal | ICD-10-CM

## 2017-02-01 DIAGNOSIS — N179 Acute kidney failure, unspecified: Secondary | ICD-10-CM

## 2017-02-01 DIAGNOSIS — T07XXXA Unspecified multiple injuries, initial encounter: Secondary | ICD-10-CM

## 2017-02-01 DIAGNOSIS — T50905A Adverse effect of unspecified drugs, medicaments and biological substances, initial encounter: Secondary | ICD-10-CM

## 2017-02-01 DIAGNOSIS — R0602 Shortness of breath: Secondary | ICD-10-CM

## 2017-02-01 DIAGNOSIS — C9150 Adult T-cell lymphoma/leukemia (HTLV-1-associated) not having achieved remission: Secondary | ICD-10-CM

## 2017-02-01 DIAGNOSIS — R55 Syncope and collapse: Secondary | ICD-10-CM

## 2017-02-01 DIAGNOSIS — R569 Unspecified convulsions: Secondary | ICD-10-CM

## 2017-02-01 DIAGNOSIS — J9611 Chronic respiratory failure with hypoxia: Secondary | ICD-10-CM

## 2017-02-01 DIAGNOSIS — I5022 Chronic systolic (congestive) heart failure: Secondary | ICD-10-CM

## 2017-02-01 DIAGNOSIS — Z86711 Personal history of pulmonary embolism: Secondary | ICD-10-CM

## 2017-02-01 DIAGNOSIS — J449 Chronic obstructive pulmonary disease, unspecified: Secondary | ICD-10-CM

## 2017-02-01 DIAGNOSIS — J9612 Chronic respiratory failure with hypercapnia: Secondary | ICD-10-CM

## 2017-02-01 DIAGNOSIS — D6959 Other secondary thrombocytopenia: Secondary | ICD-10-CM

## 2017-02-01 DIAGNOSIS — D6189 Other specified aplastic anemias and other bone marrow failure syndromes: Secondary | ICD-10-CM

## 2017-02-01 LAB — GLUCOSE, CAPILLARY
GLUCOSE-CAPILLARY: 124 mg/dL — AB (ref 65–99)
GLUCOSE-CAPILLARY: 121 mg/dL — AB (ref 65–99)
GLUCOSE-CAPILLARY: 182 mg/dL — AB (ref 65–99)
Glucose-Capillary: 140 mg/dL — ABNORMAL HIGH (ref 65–99)
Glucose-Capillary: 181 mg/dL — ABNORMAL HIGH (ref 65–99)
Glucose-Capillary: 190 mg/dL — ABNORMAL HIGH (ref 65–99)

## 2017-02-01 LAB — BASIC METABOLIC PANEL
ANION GAP: 4 — AB (ref 5–15)
BUN: 9 mg/dL (ref 6–20)
CALCIUM: 8.9 mg/dL (ref 8.9–10.3)
CHLORIDE: 106 mmol/L (ref 101–111)
CO2: 30 mmol/L (ref 22–32)
CREATININE: 0.73 mg/dL (ref 0.61–1.24)
Glucose, Bld: 123 mg/dL — ABNORMAL HIGH (ref 65–99)
POTASSIUM: 3.7 mmol/L (ref 3.5–5.1)
Sodium: 140 mmol/L (ref 135–145)

## 2017-02-01 LAB — CBC WITH DIFFERENTIAL/PLATELET
BASOS ABS: 0 10*3/uL (ref 0.0–0.1)
Basophils Relative: 0 %
EOS ABS: 0.1 10*3/uL (ref 0.0–0.7)
EOS PCT: 1 %
HCT: 21.4 % — ABNORMAL LOW (ref 39.0–52.0)
Hemoglobin: 7.3 g/dL — ABNORMAL LOW (ref 13.0–17.0)
LYMPHS ABS: 1.5 10*3/uL (ref 0.7–4.0)
Lymphocytes Relative: 29 %
MCH: 32.2 pg (ref 26.0–34.0)
MCHC: 34.1 g/dL (ref 30.0–36.0)
MCV: 94.3 fL (ref 78.0–100.0)
Monocytes Absolute: 0.3 10*3/uL (ref 0.1–1.0)
Monocytes Relative: 7 %
Neutro Abs: 3.3 10*3/uL (ref 1.7–7.7)
Neutrophils Relative %: 64 %
PLATELETS: 21 10*3/uL — AB (ref 150–400)
RBC: 2.27 MIL/uL — AB (ref 4.22–5.81)
RDW: 18.7 % — ABNORMAL HIGH (ref 11.5–15.5)
WBC: 5.1 10*3/uL (ref 4.0–10.5)

## 2017-02-01 LAB — CULTURE, BLOOD (ROUTINE X 2): Special Requests: ADEQUATE

## 2017-02-01 LAB — HEMOGLOBIN AND HEMATOCRIT, BLOOD
HEMATOCRIT: 24.5 % — AB (ref 39.0–52.0)
HEMOGLOBIN: 8.2 g/dL — AB (ref 13.0–17.0)

## 2017-02-01 LAB — SAVE SMEAR

## 2017-02-01 LAB — LACTIC ACID, PLASMA: Lactic Acid, Venous: 1.2 mmol/L (ref 0.5–1.9)

## 2017-02-01 LAB — PREPARE RBC (CROSSMATCH)

## 2017-02-01 MED ORDER — SODIUM CHLORIDE 0.9 % IV SOLN
Freq: Once | INTRAVENOUS | Status: AC
  Administered 2017-02-01: 11:00:00 via INTRAVENOUS

## 2017-02-01 NOTE — Evaluation (Signed)
Physical Therapy Evaluation Patient Details Name: Derek Shepherd MRN: [id number] DOB: 02-Sep-1955 Today's Date: 02/01/2017   History of Present Illness  Pt adm after syncopal episode and found to have Hgb of 6.3. Transfused. 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 Pt needs skilled PT to maximize independence and safety to allow discharge to home with wife. Primary limitation is foot pain.     Follow Up Recommendations Home health PT;Supervision - Intermittent(May not need HH if progresses well)    Equipment Recommendations  None recommended by PT    Recommendations for Other Services       Precautions / Restrictions Precautions Precautions: Fall      Mobility  Bed Mobility               General bed mobility comments: Pt up in chair  Transfers Overall transfer level: Needs assistance Equipment used: Rolling walker (2 wheeled) Transfers: Sit to/from Stand Sit to Stand: Min guard         General transfer comment: Assist for safety. Pt slow to rise  Ambulation/Gait Ambulation/Gait assistance: Min guard Ambulation Distance (Feet): 125 Feet Assistive device: Rolling walker (2 wheeled) Gait Pattern/deviations: Step-through pattern;Decreased step length - right;Decreased step length - left;Antalgic Gait velocity: decr Gait velocity interpretation: Below normal speed for age/gender General Gait Details: Amb on 4L of O2. Steady gait with walker but limited by foot pain  Stairs            Wheelchair Mobility    Modified Rankin (Stroke Patients Only)       Balance Overall balance assessment: Needs assistance Sitting-balance support: No upper extremity supported;Feet supported Sitting balance-Leahy Scale: Good     Standing balance support:  Bilateral upper extremity supported Standing balance-Leahy Scale: Poor Standing balance comment: UE support due to foot pain                             Pertinent Vitals/Pain Pain Assessment: 0-10 Pain Score: 7  Pain Location: bil feet R>L Pain Descriptors / Indicators: Sore Pain Intervention(s): Limited activity within patient's tolerance;Monitored during session;Premedicated before session    Home Living Family/patient expects to be discharged to:: Private residence Living Arrangements: Spouse/significant other Available Help at Discharge: Family;Available 24 hours/day Type of Home: House Home Access: Stairs to enter Entrance Stairs-Rails: Psychiatric nurse of Steps: 4 Home Layout: One level Home Equipment: Environmental consultant - 2 wheels Additional Comments: O2 at home    Prior Function Level of Independence: Independent               Hand Dominance        Extremity/Trunk Assessment   Upper Extremity Assessment Upper Extremity Assessment: Generalized weakness    Lower Extremity Assessment Lower Extremity Assessment: Generalized weakness       Communication   Communication: No difficulties  Cognition Arousal/Alertness: Awake/alert Behavior During Therapy: WFL for tasks assessed/performed Overall Cognitive Status: Within Functional Limits for tasks assessed                                        General Comments      Exercises     Assessment/Plan  PT Assessment Patient needs continued PT services  PT Problem List Decreased strength;Decreased activity tolerance;Decreased balance;Decreased mobility;Pain       PT Treatment Interventions DME instruction;Gait training;Stair training;Functional mobility training;Therapeutic activities;Therapeutic exercise;Balance training;Patient/family education    PT Goals (Current goals can be found in the Care Plan section)  Acute Rehab PT Goals Patient Stated Goal: reduce foot  pain PT Goal Formulation: With patient Time For Goal Achievement: 02/08/17 Potential to Achieve Goals: Good    Frequency Min 3X/week   Barriers to discharge Inaccessible home environment stairs to enter    Co-evaluation               AM-PAC PT "6 Clicks" Daily Activity  Outcome Measure Difficulty turning over in bed (including adjusting bedclothes, sheets and blankets)?: A Little Difficulty moving from lying on back to sitting on the side of the bed? : Unable Difficulty sitting down on and standing up from a chair with arms (e.g., wheelchair, bedside commode, etc,.)?: Unable Help needed moving to and from a bed to chair (including a wheelchair)?: A Little Help needed walking in hospital room?: A Little Help needed climbing 3-5 steps with a railing? : A Lot 6 Click Score: 13    End of Session Equipment Utilized During Treatment: Gait belt;Oxygen Activity Tolerance: Patient limited by pain Patient left: in chair;with call bell/phone within reach Nurse Communication: Mobility status PT Visit Diagnosis: Other abnormalities of gait and mobility (R26.89);Pain Pain - Right/Left: Right(and left) Pain - part of body: Ankle and joints of foot    Time: 1449-1511 PT Time Calculation (min) (ACUTE ONLY): 22 min   Charges:   PT Evaluation $PT Eval Moderate Complexity: 1 Mod     PT G CodesMarland Kitchen        [facility] [place] 02/01/2017, 3:34 PM

## 2017-02-01 NOTE — Progress Notes (Signed)
MD paged for BP 164/83. No orders received.

## 2017-02-01 NOTE — Progress Notes (Addendum)
PROGRESS NOTE    Derek Shepherd  KDT:[id number] DOB: [birth year] DOA: 01/29/2017 PCP: Claretta Fraise, MD    Brief Narrative:  61 year old male who presented with a syncope episode 01/29/17. 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 On the day of admission he experienced a syncopal episode while standing in the bathroom and urinating, positive head trauma. Brief loss of consciousness.  Apparently he has been experiencing falls recently. On his initial physical examination blood pressure 117/81, heart rate 79, temperature 101, respiratory 16, oxygenation 100%. He was noted to have positive orthostatic vital signs. white count 8.6, hemoglobin 6.3, hematocrit 19.8, platelets 31, urinalysis negative for infection. Head and neck CT with no acute findings. CT of the abdomen with no acute changes.   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  Patient was seen and examined at his bedside. He reports tingling in his lower extremities bilaterally. Denies dyspnea in the bed or chest pain or palpitations.  Assessment & Plan:   Principal Problem:   Sepsis ([place]) Active Problems:   Orthostatic hypotension   Thrombocytopenia due to drugs   COPD (chronic obstructive pulmonary disease)/BOOP   Chronic respiratory failure with hypoxia and hypercapnia (HCC)   History of chronic pulmonary embolism   Chronic systolic heart failure (HCC)   Anemia   History of Leukemia-lymphoma,  T-cell, acute, HTLV-I-associated (s/p BMT 2011)   Diabetes mellitus type 2, uncontrolled ([place])   Acute kidney injury ([place])  1. Syncope due to symptomatic anemia.  -Hg 7.3 from 8 post 2 U  PRBCs -2U PRBCs ordered due to symptomatic anemia -repeat H&H post transfusion -bruising noted on right and Left back -may consider repeating CT abdomen pelvis if hg does not correct appropriately  2. Severe thrombocytopenia -plt 21k from 24k -close monitoring -No sign of obvious bleed -afebrile -transfuse if plt drops below 10k -cbc am  3. Severe anemia requiring multiple blood transfusion most likely 2/2 to bone marrow suppression -hg 7.3 from 8 -2u prbcs -cbc am  4. 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   -On Sirolimus.  -Old records from [place] : Bone marrow 04/12/16 evidenced hypocellular marrow (20%) with dysmegakaryopoiesis and no increase in blasts (<2%). Suggesting some degree of marrow failure -recent PRBC transfusion.  -Dr. Myles Rosenthal who agree with PRBC transfusion and supportive medical therapy, continue Sirolimus -will need follow up with Dr. Norma Fredrickson at [facility].  -Will continue acyclovir, complete course of fluconazole (5 doses) -prophylactic Bactrim (M-W-F).   5. Acute kidney injury with hypokalemia.  -resolved -Renal function has improved after prbc transfusion and gentle hydration -bmp am  6. COPD with chronic hypoxemic respiratory failure.  -on 4L  at home -continue O2 supplement to maintain O2 sat 92% or greater -levalbuterol, dulera, tiotropium, prednisone taper.   7. Anxiety/ depression. -stable -citalopram, clonazepam 0.5 mg BIDprn, nefazedone -lorazepam po 1 mg qhs  8. T2DM.  -insulin sliding scale for glucose cover and monitoring -hypoglyemic protocol -A1c7.4 (01/29/17)  9. Left sided old rib fractures, present on admission -pain management as needed with IV dilaudid  10. Lactic  acidosis -resolving -lactic acid 3.1 (01/30/17) from 8.5 (01/29/17) -gentle IV hydration -lactic acid am   DVT prophylaxis: scds due to severe thrombocytopenia and anemia requiring multiple blood transfusions.  Code Status: Full Family Communication:  No family at the bedside  Disposition Plan: Will stay another midnight until hg is stable   Consultants:   None  Procedures:   None  Antimicrobials:    bactrim, acyclovir, fluconazole prophylactically due to immunosuppression    Objective: Vitals:   01/31/17 2047 01/31/17 2330 02/01/17 0334 02/01/17 0401  BP:  (!) 164/85 (!) 156/86   Pulse:  78 75   Resp:   19   Temp:  98.7 F (37.1 C) 98 F (36.7 C)   TempSrc:  Oral Oral   SpO2: 99% 99% 100%   Weight:    95.5 kg (210 lb 8.6 oz)  Height:        Intake/Output Summary (Last 24 hours) at 02/01/2017 0713 Last data filed at 02/01/2017 0400 Gross per 24 hour  Intake 1929.17 ml  Output 2200 ml  Net -270.83 ml   Filed Weights   01/30/17 0600 01/31/17 0404 02/01/17 0401  Weight: 88.5 kg (195 lb 1.7 oz) 92.4 kg (203 lb 11.3 oz) 95.5 kg (210 lb 8.6 oz)    Examination:   General: 61 yo CM WD WN uncomfortable due to tingling in his lower extremities bilaterally Neurology: Awake and alert, non focal  EENT: positive pallor, no icterus, oral mucosa dry Cardiovascular: No JVD. S1-S2 present, rhythmic, no gallops, rubs, or murmurs. Trace lower extremity edema. Port noted on right side of chest Pulmonary: decreased breath sounds bilaterally at bases, adequate air movement, no wheezing, rhonchi or rales. Bruising noted on right and left back. Left greater than right. Gastrointestinal. Abdomen obese, non tender, no rebound or guarding Skin. Multiple ecchymosis at the flanks, and upper extremities. Positive ecchymosis at the right first toe.  Musculoskeletal: no joint deformities   Data Reviewed: I have personally reviewed following labs and imaging  studies  CBC: Recent Labs  Lab 01/29/17 1128 01/30/17 0224 01/30/17 0546 01/31/17 0416 02/01/17 0510  WBC 7.7 8.6 7.8 6.6 5.1  NEUTROABS 5.5  --  4.6 3.9 3.3  HGB 8.8* 6.3* 5.8* 8.3* 7.3*  HCT 28.1* 19.8* 18.7* 24.1* 21.4*  MCV 103.3* 100.0 99.5 92.3 94.3  PLT 44* 31* 33* 24* 21*   Basic Metabolic Panel: Recent Labs  Lab 01/29/17 1128 01/30/17 0224 01/31/17 0416 02/01/17 0510  NA 135 141 136 140  K 4.0 3.6 3.3* 3.7  CL 88* 98* 99* 106  CO2 33* 35* 31 30  GLUCOSE 253* 179* 129* 123*  BUN 39* 29* 13 9  CREATININE 1.32* 1.28* 0.86 0.73  CALCIUM 9.1 8.2* 8.5* 8.9   GFR: Estimated Creatinine Clearance: 107.6 mL/min (by C-G formula based on SCr of 0.73 mg/dL). Liver Function Tests: Recent Labs  Lab 01/29/17 1128 01/30/17 0224  AST 36 37  ALT 35 30  ALKPHOS 53 41  BILITOT 0.7 0.8  PROT 5.9* 4.7*  ALBUMIN 3.0* 2.6*   No results for input(s): LIPASE, AMYLASE in the last 168 hours. No results for input(s): AMMONIA in the last 168 hours. Coagulation Profile: Recent Labs  Lab 01/29/17 1128  INR 2.33   Cardiac Enzymes: Recent Labs  Lab 01/29/17 1610  CKTOTAL 35*   BNP (last 3 results) No results for input(s): PROBNP in the last 8760 hours. HbA1C: Recent Labs    01/29/17 1610  HGBA1C 7.4*   CBG: Recent  Labs  Lab 01/31/17 1141 01/31/17 1614 01/31/17 2017 01/31/17 2337 02/01/17 0340  GLUCAP 229* 253* 293* 99 124*   Lipid Profile: No results for input(s): CHOL, HDL, LDLCALC, TRIG, CHOLHDL, LDLDIRECT in the last 72 hours. Thyroid Function Tests: No results for input(s): TSH, T4TOTAL, FREET4, T3FREE, THYROIDAB in the last 72 hours. Anemia Panel: No results for input(s): VITAMINB12, FOLATE, FERRITIN, TIBC, IRON, RETICCTPCT in the last 72 hours.    Radiology Studies: I have reviewed all of the imaging during this hospital visit personally     Scheduled Meds: . acyclovir  400 mg Oral Daily  . citalopram  20 mg Oral Daily   Followed by  .  [START ON 02/03/2017] citalopram  40 mg Oral Daily  . fluconazole  400 mg Oral Daily  . fluticasone  1 spray Each Nare Daily  . folic acid  1 mg Oral Daily  . insulin aspart  0-15 Units Subcutaneous Q4H  . LORazepam  1 mg Oral QHS  . mometasone-formoterol  2 puff Inhalation BID  . multivitamin with minerals  1 tablet Oral Daily  . nefazodone  50 mg Oral BID  . predniSONE  10 mg Oral Q breakfast  . rOPINIRole  3 mg Oral QHS  . Sirolimus  0.5 mg Oral Daily  . sulfamethoxazole-trimethoprim  1 tablet Oral Q M,W,F  . tiotropium  18 mcg Inhalation Daily   Continuous Infusions: . sodium chloride 1,000 mL (01/31/17 2034)     LOS: 3 days        Kayleen Memos, MD [facility] Pager [phone]

## 2017-02-01 NOTE — Progress Notes (Signed)
Patient declined NIV at this time, no distress noted, RCP will continue to monitor.  

## 2017-02-01 NOTE — Progress Notes (Signed)
Inpatient Diabetes Program Recommendations  AACE/ADA: New Consensus Statement on Inpatient Glycemic Control (2015)  Target Ranges:  Prepandial:   less than 140 mg/dL      Peak postprandial:   less than 180 mg/dL (1-2 hours)      Critically ill patients:  140 - 180 mg/dL   Lab Results  Component Value Date   GLUCAP 121 (H) 02/01/2017   HGBA1C 7.4 (H) 01/29/2017    Review of Glycemic Control Results for Derek Shepherd, Derek Shepherd (MRN [id number]) as of 02/01/2017 09:26  Ref. Range 01/31/2017 16:14 01/31/2017 20:17 01/31/2017 23:37 02/01/2017 03:40 02/01/2017 08:06  Glucose-Capillary Latest Ref Range: 65 - 99 mg/dL 253 (H) 293 (H) 99 124 (H) 121 (H)   Diabetes history: DM2 Outpatient Diabetes medications: Metformin 500 mg bid Current orders for Inpatient glycemic control: Novolog correction moderate q 4 hrs.  Inpatient Diabetes Program Recommendations:  While Metformin held and on steroids, please consider: -Lantus 15 units daily -Novolog 4 units tid meal coverage if eats 50% -Change Novolog correction to tid & hs 0-5 units  Thank you, Bethena Roys E. Rori Goar, RN, MSN, CDE  Diabetes Coordinator Inpatient Glycemic Control Team Team Pager [phone] (8am-5pm) 02/01/2017 9:26 AM

## 2017-02-02 DIAGNOSIS — Z9481 Bone marrow transplant status: Secondary | ICD-10-CM

## 2017-02-02 DIAGNOSIS — D696 Thrombocytopenia, unspecified: Secondary | ICD-10-CM

## 2017-02-02 DIAGNOSIS — D89813 Graft-versus-host disease, unspecified: Secondary | ICD-10-CM

## 2017-02-02 DIAGNOSIS — Z856 Personal history of leukemia: Secondary | ICD-10-CM

## 2017-02-02 DIAGNOSIS — Z8042 Family history of malignant neoplasm of prostate: Secondary | ICD-10-CM

## 2017-02-02 DIAGNOSIS — Z87891 Personal history of nicotine dependence: Secondary | ICD-10-CM

## 2017-02-02 DIAGNOSIS — I509 Heart failure, unspecified: Secondary | ICD-10-CM

## 2017-02-02 LAB — TYPE AND SCREEN
ABO/RH(D): A POS
Antibody Screen: NEGATIVE
UNIT DIVISION: 0
UNIT DIVISION: 0
Unit division: 0
Unit division: 0

## 2017-02-02 LAB — BPAM RBC
BLOOD PRODUCT EXPIRATION DATE: 201812142359
BLOOD PRODUCT EXPIRATION DATE: 201812192359
Blood Product Expiration Date: 201812122359
Blood Product Expiration Date: 201812192359
ISSUE DATE / TIME: 201811261800
ISSUE DATE / TIME: 201811281047
ISSUE DATE / TIME: 201811261302
ISSUE DATE / TIME: 201811281502
UNIT TYPE AND RH: 6200
UNIT TYPE AND RH: 6200
Unit Type and Rh: 6200
Unit Type and Rh: 6200

## 2017-02-02 LAB — CBC
HEMATOCRIT: 28.1 % — AB (ref 39.0–52.0)
HEMOGLOBIN: 9.5 g/dL — AB (ref 13.0–17.0)
MCH: 31.4 pg (ref 26.0–34.0)
MCHC: 33.8 g/dL (ref 30.0–36.0)
MCV: 92.7 fL (ref 78.0–100.0)
Platelets: 19 10*3/uL — CL (ref 150–400)
RBC: 3.03 MIL/uL — ABNORMAL LOW (ref 4.22–5.81)
RDW: 17.1 % — AB (ref 11.5–15.5)
WBC: 5.1 10*3/uL (ref 4.0–10.5)

## 2017-02-02 LAB — GLUCOSE, CAPILLARY
GLUCOSE-CAPILLARY: 116 mg/dL — AB (ref 65–99)
GLUCOSE-CAPILLARY: 187 mg/dL — AB (ref 65–99)
Glucose-Capillary: 110 mg/dL — ABNORMAL HIGH (ref 65–99)
Glucose-Capillary: 159 mg/dL — ABNORMAL HIGH (ref 65–99)
Glucose-Capillary: 209 mg/dL — ABNORMAL HIGH (ref 65–99)

## 2017-02-02 LAB — BASIC METABOLIC PANEL
Anion gap: 5 (ref 5–15)
BUN: 11 mg/dL (ref 6–20)
CALCIUM: 8.9 mg/dL (ref 8.9–10.3)
CHLORIDE: 104 mmol/L (ref 101–111)
CO2: 30 mmol/L (ref 22–32)
CREATININE: 0.69 mg/dL (ref 0.61–1.24)
GFR calc Af Amer: >60 mL/min (ref >60)
GFR calc non Af Amer: >60 mL/min (ref >60)
Glucose, Bld: 118 mg/dL — ABNORMAL HIGH (ref 65–99)
Potassium: 3.8 mmol/L (ref 3.5–5.1)
SODIUM: 139 mmol/L (ref 135–145)

## 2017-02-02 MED ORDER — TIOTROPIUM BROMIDE MONOHYDRATE 18 MCG IN CAPS
18.0000 ug | ORAL_CAPSULE | Freq: Every day | RESPIRATORY_TRACT | Status: DC
  Administered 2017-02-03 – 2017-02-04 (×2): 18 ug via RESPIRATORY_TRACT
  Filled 2017-02-02 (×2): qty 5

## 2017-02-02 MED ORDER — AMLODIPINE BESYLATE 5 MG PO TABS
5.0000 mg | ORAL_TABLET | Freq: Every day | ORAL | Status: DC
  Administered 2017-02-02 – 2017-02-04 (×3): 5 mg via ORAL
  Filled 2017-02-02 (×3): qty 1

## 2017-02-02 MED ORDER — SENNOSIDES-DOCUSATE SODIUM 8.6-50 MG PO TABS
1.0000 | ORAL_TABLET | Freq: Two times a day (BID) | ORAL | Status: DC
  Administered 2017-02-02 – 2017-02-04 (×5): 1 via ORAL
  Filled 2017-02-02 (×5): qty 1

## 2017-02-02 NOTE — Consult Note (Signed)
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 in the abdominal or pelvic vasculature. No lymphadenopathy noted in the abdomen or pelvis. Reproductive: Prostate gland and seminal vesicles are unremarkable in appearance. Other: No significant volume of ascites.  No pneumoperitoneum. Musculoskeletal: There are no aggressive appearing lytic or blastic lesions noted in the visualized portions of the skeleton. Old healed fractures of the left superior and inferior pubic rami. No acute displaced fractures are noted in the visualized portions of the skeleton. Review of the MIP images confirms the above findings. IMPRESSION: 1. No evidence of significant acute traumatic injury to the chest, abdomen or pelvis. 2. Small nonocclusive subsegmental sized embolus in the right lower lobe, as above. This is of doubtful clinical significance. 3. 7 mm nonobstructive calculus in the upper pole collecting system  of the left kidney. No ureteral stones or findings of urinary tract obstruction are noted at this time. 4. Aortic atherosclerosis, in  addition to right coronary artery disease. Please note that although the presence of coronary artery calcium documents the presence of coronary artery disease, the severity of this disease and any potential stenosis cannot be assessed on this non-gated CT examination. Assessment for potential risk factor modification, dietary therapy or pharmacologic therapy may be warranted, if clinically indicated. 5. Old posttraumatic changes and postoperative changes, as above. Electronically Signed   By: Vinnie Langton M.D.   On: 01/29/2017 13:58   Dg Chest Port 1 View  Result Date: 01/29/2017 CLINICAL DATA:  Shortness of breath tonight EXAM: PORTABLE CHEST 1 VIEW COMPARISON:  01/29/2017 FINDINGS: Shallow inspiration. Heart size and pulmonary vascularity are normal. Right central venous catheter with tip over the cavoatrial junction region. No airspace disease or consolidation in the lungs. No blunting of costophrenic angles. No pneumothorax. Postoperative changes in the left ribs. IMPRESSION: No active disease. Electronically Signed   By: Lucienne Capers M.D.   On: 01/29/2017 23:08    ASSESSMENT: 61 y.o. 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  There are minimal rouleaux.  There is no significant anisocytosis.  There are no platelet clumps.  The white cells are mature polys, some hypolobated, and lymphs  PLAN: Derek Shepherd thrombocytopenia is multifactorial.  There is going to be a decreased production from a hypocellular bone marrow, probably decreased production from somewhat dysplastic plasma cells as seen in his most recent bone marrow biopsy, and also possibly decreased  production secondary to medications which affect the bone marrow including of course sirolimus.  There may be increased consumption of platelets secondary to graft-versus-host disease and bleeding and bruising, and there may be an element of ITP as well.  I would not favor platelet transfusion unless the count drops to 10 or less or there is overt bleeding.  Similarly I would probably hold off on blood transfusions unless the hemoglobin is less than 7, unless the patient is symptomatic.  Derek Shepherd believes his syncopal episodes are related to aggressive treatment of his congestive heart failure and it is going to be difficult to find the balance point, if there is one, between his heart problems, lung problems, post transplant issues and his bone marrow capacity.  He likely will require close follow-up and still he may end up with multiple admissions as has been the case recently.  I think it would be useful to obtain a baseline workup for myeloma and this will include an IgG level.  If it is low we will supplement with IVIG.  This may be helpful as far as the platelet count is concerned.  Otherwise on discharge the patient should be followed at [facility] through his established hematologist, pulmonologist, and ophthalmologist as well as Dr. Livia Shepherd his primary physician.  I will follow with you through this hospitalization and be available otherwise on an outpatient basis as needed.    Derek Cruel, MD   02/02/2017 6:34 PM Medical Oncology and Hematology [facility] [address] [place], [place] [address] Tel. [phone]    Fax. [phone]

## 2017-02-02 NOTE — Progress Notes (Signed)
PROGRESS NOTE    CORDAY WYKA  LFY:[id number] DOB: 1955/07/24 DOA: 01/29/2017 PCP: Claretta Fraise, MD    Brief Narrative:  61 year old male who presented with a syncope episode 01/29/17. 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 On the day of admission he experienced a syncopal episode while standing in the bathroom and urinating, positive head trauma. Brief loss of consciousness.  Apparently he has been experiencing falls recently. On his initial physical examination blood pressure 117/81, heart rate 79, temperature 101, respiratory 16, oxygenation 100%. He was noted to have positive orthostatic vital signs. white count 8.6, hemoglobin 6.3, hematocrit 19.8, platelets 31, urinalysis negative for infection. Head and neck CT with no acute findings. CT of the abdomen with no acute changes.   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  Patient was seen and examined at his bedside. States he slept well. Denies dyspnea in the bed or chest pain or palpitations.  The writer called [facility] 02/02/17 to speak with the pt's hematologist. Dr. Rebecca Eaton is on vacation and will return on 02/07/17. Dr Shirleen Schirmer is covering for him. The writer left her cell phone number to be called back to report on current management. Hemeoncology consulted. We appreciate recommendations.  Assessment & Plan:   Principal Problem:   Sepsis ([place]) Active Problems:  Orthostatic hypotension   Thrombocytopenia due to drugs   COPD (chronic obstructive pulmonary disease)/BOOP   Chronic respiratory failure with hypoxia and hypercapnia (HCC)   History of chronic pulmonary embolism   Chronic systolic heart failure (HCC)   Anemia   History of Leukemia-lymphoma, T-cell, acute, HTLV-I-associated (s/p BMT 2011)   Diabetes mellitus type 2, uncontrolled ([place])   Acute kidney injury ([place])  1. Post Syncope due to symptomatic anemia.  -Hg 5.8  On presentation -Hg 9.5 from 7.3 post 2U prbcs  -repeat H&H this pm -bruising noted on right and Left back -may consider repeating CT abdomen pelvis if hg drops this pm  2. Severe thrombocytopenia -plt 19 k from 21k from 24k -close monitoring -No sign of obvious bleed -afebrile -transfuse if plt drops below 10k -cbc am  3. Severe anemia requiring multiple blood transfusion most likely 2/2 to bone marrow suppression -hg 9.5 post 2 u prbcs from 7.3 (02/01/17) -H&H pm -cbc am  4. 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   -On Sirolimus.  -Old records from [facility] : Bone marrow 04/12/16 evidenced hypocellular marrow (20%) with dysmegakaryopoiesis and no increase in blasts (<2%). Suggesting some degree of marrow failure -recent PRBC transfusion.  -Dr. Myles Rosenthal contacted this am 02/01/17 by the writer. Awaiting call back. -continue Sirolimus -will need follow up with Hematology at [facility].  -Will continue acyclovir, complete course of fluconazole (5 doses) -prophylactic Bactrim (M-W-F).   5. Acute kidney injury with hypokalemia, resolved.  -cr 0.69 (02/02/17) -Renal function has improved after prbc transfusion and gentle hydration -bmp am  6. COPD with chronic hypoxemic respiratory failure.  -on 4L [place] at home -continue O2 supplement to maintain O2 sat 92% or greater -levalbuterol, dulera, tiotropium, prednisone  taper.   7. Anxiety/  depression. -stable -citalopram, clonazepam 0.5 mg BIDprn, nefazedone -lorazepam po 1 mg qhs  8. T2DM.  -insulin sliding scale for glucose cover and monitoring -hypoglyemic protocol -A1c7.4 (01/29/17)  9. Left sided old rib fractures, present on admission -pain management as needed with IV dilaudid  10. Lactic acidosis -resolving -lactic acid 3.1 (01/30/17) from 8.5 (01/29/17) -gentle IV hydration -lactic acid am   DVT prophylaxis: scds due to severe thrombocytopenia and anemia requiring multiple blood transfusions.  Code Status: Full Family Communication:  No family at the bedside  Disposition Plan: Will stay another midnight until hg is stable   Consultants:   None  Procedures:   None  Antimicrobials:    bactrim, acyclovir, fluconazole prophylactically due to immunosuppression    Objective: Vitals:   02/02/17 0355 02/02/17 0400 02/02/17 0756 02/02/17 0800  BP:  (!) 150/90  (!) 156/75  Pulse:  64  76  Resp:  15  19  Temp:  98.5 F (36.9 C)  (!) 97.4 F (36.3 C)  TempSrc:  Oral  Axillary  SpO2:  99% 98% 99%  Weight: 95.8 kg (211 lb 3.2 oz)     Height:        Intake/Output Summary (Last 24 hours) at 02/02/2017 1014 Last data filed at 02/02/2017 0900 Gross per 24 hour  Intake 2815 ml  Output 2025 ml  Net 790 ml   Filed Weights   01/31/17 0404 02/01/17 0401 02/02/17 0355  Weight: 92.4 kg (203 lb 11.3 oz) 95.5 kg (210 lb 8.6 oz) 95.8 kg (211 lb 3.2 oz)    Examination:   General: 61 yo CM WD WN NAD. A&O x3 Neurology: Awake and alert, non focal  EENT: positive pallor, no icterus, oral mucosa dry Cardiovascular: No JVD. S1-S2 present, rhythmic, no gallops, rubs, or murmurs. Trace lower extremity edema. Port noted on right side of chest Pulmonary: decreased breath sounds bilaterally at bases, adequate air movement, no wheezing, rhonchi or rales. Bruising noted on right and left back. Left greater than right. Gastrointestinal. Abdomen obese, non  tender, no rebound or guarding Skin. Multiple ecchymosis at the flanks, and upper extremities. Positive ecchymosis at the right first toe.  Musculoskeletal: no joint deformities   Data Reviewed: I have personally reviewed following labs and imaging studies  CBC: Recent Labs  Lab 01/29/17 1128 01/30/17 0224 01/30/17 0546 01/31/17 0416 02/01/17 0510 02/01/17 2107 02/02/17 0408  WBC 7.7 8.6 7.8 6.6 5.1  --  5.1  NEUTROABS 5.5  --  4.6 3.9 3.3  --   --   HGB 8.8* 6.3* 5.8* 8.3* 7.3* 8.2* 9.5*  HCT 28.1* 19.8* 18.7* 24.1* 21.4* 24.5* 28.1*  MCV 103.3* 100.0 99.5 92.3 94.3  --  92.7  PLT 44* 31* 33* 24* 21*  --  19*   Basic Metabolic Panel: Recent Labs  Lab 01/29/17 1128 01/30/17 0224 01/31/17 0416 02/01/17 0510 02/02/17 0408  NA 135 141 136 140 139  K 4.0 3.6 3.3* 3.7 3.8  CL 88* 98* 99* 106 104  CO2 33* 35* 31 30 30   GLUCOSE 253* 179* 129* 123* 118*  BUN 39* 29* 13 9 11   CREATININE 1.32* 1.28* 0.86 0.73 0.69  CALCIUM 9.1 8.2* 8.5* 8.9 8.9   GFR: Estimated Creatinine Clearance: 107.7 mL/min (by C-G formula based on SCr of 0.69 mg/dL). Liver Function Tests: Recent Labs  Lab 01/29/17 1128 01/30/17 0224  AST 36 37  ALT 35 30  ALKPHOS 53 41  BILITOT 0.7 0.8  PROT 5.9* 4.7*  ALBUMIN 3.0* 2.6*   No results for input(s): LIPASE, AMYLASE in the last 168 hours. No results for input(s): AMMONIA in the last 168 hours. Coagulation Profile: Recent Labs  Lab 01/29/17 1128  INR 2.33   Cardiac Enzymes: Recent Labs  Lab 01/29/17 1610  CKTOTAL 35*   BNP (last 3 results) No results for input(s): PROBNP in the last 8760 hours. HbA1C: No results for input(s): HGBA1C in the last 72 hours. CBG: Recent Labs  Lab 02/01/17 1628 02/01/17 2000 02/01/17 2337 02/02/17 0436 02/02/17 0759  GLUCAP 182* 190* 140* 110* 116*   Lipid Profile: No results for input(s): CHOL, HDL, LDLCALC, TRIG, CHOLHDL, LDLDIRECT in the last 72 hours. Thyroid Function Tests: No results for  input(s): TSH, T4TOTAL, FREET4, T3FREE, THYROIDAB in the last 72 hours. Anemia Panel: No results for input(s): VITAMINB12, FOLATE, FERRITIN, TIBC, IRON, RETICCTPCT in the last 72 hours.    Radiology Studies: I have reviewed all of the imaging during this hospital visit personally     Scheduled Meds: . acyclovir  400 mg Oral Daily  . amLODipine  5 mg Oral Daily  . [START ON 02/03/2017] citalopram  40 mg Oral Daily  . fluticasone  1 spray Each Nare Daily  . folic acid  1 mg Oral Daily  . insulin aspart  0-15 Units Subcutaneous Q4H  . LORazepam  1 mg Oral QHS  . mometasone-formoterol  2 puff Inhalation BID  . multivitamin with minerals  1 tablet Oral Daily  . nefazodone  50 mg Oral BID  . rOPINIRole  3 mg Oral QHS  . senna-docusate  1 tablet Oral BID  . Sirolimus  0.5 mg Oral Daily  . sulfamethoxazole-trimethoprim  1 tablet Oral Q M,W,F  . [START ON 02/03/2017] tiotropium  18 mcg Inhalation Daily   Continuous Infusions: . sodium chloride 1,000 mL (02/02/17 0600)     LOS: 4 days        Kayleen Memos, MD [facility] Pager [phone]

## 2017-02-02 NOTE — Progress Notes (Signed)
Pt refused BIPAP for the night\. Pt sitting in chair on 4l nasal canula with no respiratory distress. PT Vitals: HR 78, RR-17, 100%. Advised pt to call if he felt SOB or wanted to wear the BIPAP. Rt to cont to monitor.

## 2017-02-02 NOTE — Progress Notes (Signed)
Physical Therapy Treatment Patient Details Name: Derek Shepherd MRN: [id number] DOB: 1956-02-17 Today's Date: 02/02/2017    History of Present Illness Pt adm after syncopal episode and found to have Hgb of 6.3. Transfused. PMH - copd on home o2, Leukemia with bone marrow transplant 2011, graft vs host disease, dvt, pe, chf    PT Comments    Pt making steady progress.   Follow Up Recommendations  Home health PT;Supervision - Intermittent(May not need HH if progresses well)     Equipment Recommendations  None recommended by PT    Recommendations for Other Services       Precautions / Restrictions Precautions Precautions: Fall    Mobility  Bed Mobility               General bed mobility comments: Pt up in chair  Transfers Overall transfer level: Needs assistance Equipment used: Rolling walker (2 wheeled) Transfers: Sit to/from Stand Sit to Stand: Supervision         General transfer comment: Assist for safety. Pt slow to rise  Ambulation/Gait Ambulation/Gait assistance: Supervision Ambulation Distance (Feet): 175 Feet(x 2) Assistive device: Rolling walker (2 wheeled) Gait Pattern/deviations: Step-through pattern;Decreased step length - right;Decreased step length - left;Antalgic Gait velocity: decr Gait velocity interpretation: Below normal speed for age/gender General Gait Details: Amb on 4L of O2. Steady gait with walker but limited by foot pain   Stairs            Wheelchair Mobility    Modified Rankin (Stroke Patients Only)       Balance Overall balance assessment: Needs assistance Sitting-balance support: No upper extremity supported;Feet supported Sitting balance-Leahy Scale: Good     Standing balance support: Bilateral upper extremity supported Standing balance-Leahy Scale: Poor Standing balance comment: UE support due to foot pain                            Cognition Arousal/Alertness: Awake/alert Behavior During  Therapy: WFL for tasks assessed/performed Overall Cognitive Status: Within Functional Limits for tasks assessed                                        Exercises      General Comments        Pertinent Vitals/Pain Pain Assessment: Faces Faces Pain Scale: Hurts even more Pain Location: bil feet R>L Pain Descriptors / Indicators: Sore Pain Intervention(s): Limited activity within patient's tolerance;Monitored during session    Home Living                      Prior Function            PT Goals (current goals can now be found in the care plan section) Progress towards PT goals: Progressing toward goals    Frequency    Min 3X/week      PT Plan Current plan remains appropriate    Co-evaluation              AM-PAC PT "6 Clicks" Daily Activity  Outcome Measure  Difficulty turning over in bed (including adjusting bedclothes, sheets and blankets)?: A Little Difficulty moving from lying on back to sitting on the side of the bed? : Unable Difficulty sitting down on and standing up from a chair with arms (e.g., wheelchair, bedside commode, etc,.)?: Unable Help needed moving to and from  a bed to chair (including a wheelchair)?: A Little Help needed walking in hospital room?: A Little Help needed climbing 3-5 steps with a railing? : A Lot 6 Click Score: 13    End of Session Equipment Utilized During Treatment: Gait belt;Oxygen Activity Tolerance: Patient limited by pain;Patient tolerated treatment well Patient left: in chair;with call bell/phone within reach Nurse Communication: Mobility status PT Visit Diagnosis: Other abnormalities of gait and mobility (R26.89);Pain Pain - Right/Left: Right(and left) Pain - part of body: Ankle and joints of foot     Time: 1610-9604 PT Time Calculation (min) (ACUTE ONLY): 37 min  Charges:  $Gait Training: 23-37 mins                    G Codes:       [facility] PT [place] 02/02/2017, 3:38 PM

## 2017-02-03 DIAGNOSIS — E1165 Type 2 diabetes mellitus with hyperglycemia: Secondary | ICD-10-CM

## 2017-02-03 LAB — BASIC METABOLIC PANEL
Anion gap: 6 (ref 5–15)
BUN: 10 mg/dL (ref 6–20)
CHLORIDE: 104 mmol/L (ref 101–111)
CO2: 28 mmol/L (ref 22–32)
CREATININE: 0.57 mg/dL — AB (ref 0.61–1.24)
Calcium: 9.2 mg/dL (ref 8.9–10.3)
Glucose, Bld: 98 mg/dL (ref 65–99)
POTASSIUM: 3.9 mmol/L (ref 3.5–5.1)
SODIUM: 138 mmol/L (ref 135–145)

## 2017-02-03 LAB — GLUCOSE, CAPILLARY
GLUCOSE-CAPILLARY: 120 mg/dL — AB (ref 65–99)
GLUCOSE-CAPILLARY: 142 mg/dL — AB (ref 65–99)
GLUCOSE-CAPILLARY: 96 mg/dL (ref 65–99)
Glucose-Capillary: 109 mg/dL — ABNORMAL HIGH (ref 65–99)
Glucose-Capillary: 141 mg/dL — ABNORMAL HIGH (ref 65–99)
Glucose-Capillary: 170 mg/dL — ABNORMAL HIGH (ref 65–99)
Glucose-Capillary: 94 mg/dL (ref 65–99)

## 2017-02-03 LAB — CBC
HCT: 30 % — ABNORMAL LOW (ref 39.0–52.0)
HEMOGLOBIN: 9.7 g/dL — AB (ref 13.0–17.0)
MCH: 30.9 pg (ref 26.0–34.0)
MCHC: 32.3 g/dL (ref 30.0–36.0)
MCV: 95.5 fL (ref 78.0–100.0)
PLATELETS: 22 10*3/uL — AB (ref 150–400)
RBC: 3.14 MIL/uL — AB (ref 4.22–5.81)
RDW: 17.1 % — ABNORMAL HIGH (ref 11.5–15.5)
WBC: 4.5 10*3/uL (ref 4.0–10.5)

## 2017-02-03 LAB — CULTURE, BLOOD (ROUTINE X 2): CULTURE: NO GROWTH

## 2017-02-03 NOTE — Care Management (Signed)
ED CM contacted by secretary and Kristin Bruins RN on 4E concerning inter-facility transfer, CM explained this is a Primary school teacher responsibility using [place], but I would be happy to assist with the process once she is ready to complete the information.

## 2017-02-03 NOTE — Care Management Important Message (Signed)
Important Message  Patient Details  Name: Derek Shepherd MRN: [id number] Date of Birth: Oct 24, 1955   Medicare Important Message Given:  Yes    Nathen May 02/03/2017, 11:27 AM

## 2017-02-03 NOTE — Progress Notes (Signed)
PROGRESS NOTE    Derek Shepherd  UVO:[id number] DOB: [birth year] DOA: 01/29/2017 PCP: Claretta Fraise, MD    Brief Narrative:  61 year old male who presented with a syncope episode 01/29/17. 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 On the day of admission he experienced a syncopal episode while standing in the bathroom and urinating, positive head trauma. Brief loss of consciousness.  Apparently he has been experiencing falls recently. On his initial physical examination blood pressure 117/81, heart rate 79, temperature 101, respiratory 16, oxygenation 100%. He was noted to have positive orthostatic vital signs. white count 8.6, hemoglobin 6.3, hematocrit 19.8, platelets 31, urinalysis negative for infection. Head and neck CT with no acute findings. CT of the abdomen with no acute changes.   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  Patient was seen and examined at his bedside. States he slept well. Denies dyspnea in the bed or chest pain or palpitations.  The writer called [facility] 02/02/17 and 11/30 to speak with the pt's hematologist. Dr. Rebecca Eaton is on vacation and will return on 02/07/17. Dr Shirleen Schirmer is covering for him. Dr Hardin Negus recommended transfer back to [facility]. No bed available currently. Patient unsure if he wants to be transferred. Stated he will discuss with his wife and let us know.  Patient denies any pain, dyspnea  or overt bleeding. Sitting up in the chair in NAD.   Assessment & Plan:   Principal Problem:   Sepsis ([place]) Active Problems:   Orthostatic hypotension   Thrombocytopenia due to drugs   COPD (chronic obstructive pulmonary disease)/BOOP   Chronic respiratory failure with hypoxia and hypercapnia (HCC)   History of chronic pulmonary embolism   Chronic systolic heart failure (HCC)   Anemia   History of Leukemia-lymphoma, T-cell, acute, HTLV-I-associated (s/p BMT 2011)   Diabetes mellitus type 2, uncontrolled ([place])   Acute kidney injury ([place])  1. Post Syncope due to symptomatic anemia.  -Hg 5.8  On presentation -Hg stable 9.7 from 9.5 from 7.3 post 2U prbcs  -bruising improving on right and Left back -working with PT recommends Home health PT at discharge  2. Severe thrombocytopenia -plt 22 from 19 k from 21k from 24k -close monitoring -No sign of obvious bleed -afebrile -transfuse if plt drops below 10k -cbc am  3. Severe anemia requiring multiple blood transfusion most likely 2/2 to bone marrow suppression -hg 9.7 from 9.5 post 2 u prbcs from 7.3 (02/01/17) -no sign of bleeding -cbc am  4. 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   -On Sirolimus.  -Old records from [place] [place] : Bone marrow 04/12/16 evidenced hypocellular marrow (20%) with dysmegakaryopoiesis and no increase in blasts (<2%). Suggesting some degree of marrow failure -recent PRBC transfusion.  -Dr. Myles Rosenthal contacted this am 02/01/17 by the writer. Awaiting call back. -continue Sirolimus -will need follow up with Hematology at [facility].  -Will continue acyclovir, complete course of fluconazole (5 doses) -prophylactic Bactrim (M-W-F).   5. Acute kidney injury with hypokalemia, resolved.  -cr 0.57 from 0.69 (02/02/17) -Renal function has improved  after prbc transfusion and gentle hydration -bmp am  6. COPD with chronic hypoxemic respiratory failure.  -on 4L [place]  at home -continue O2 supplement to maintain O2 sat 92% or greater -levalbuterol, dulera, tiotropium, prednisone taper.   7. Anxiety/ depression. -stable -citalopram, clonazepam 0.5 mg BIDprn, nefazedone -lorazepam po 1 mg qhs  8. T2DM.  -insulin sliding scale for glucose cover and monitoring -hypoglyemic protocol -A1c7.4 (01/29/17)  9. Left sided old rib fractures, present on admission -pain management as needed with IV dilaudid  10. Lactic acidosis -resolving -lactic acid 3.1 (01/30/17) from 8.5 (01/29/17) -gentle IV hydration -lactic acid am   DVT prophylaxis: scds due to severe thrombocytopenia and anemia requiring multiple blood transfusions.  Code Status: Full Family Communication:  No family at the bedside  Disposition Plan: Will stay another midnight to continue current management.   Consultants:   Hematology  Procedures:   None  Antimicrobials:    bactrim, acyclovir, fluconazole prophylactically due to immunosuppression    Objective: Vitals:   02/03/17 0804 02/03/17 0827 02/03/17 1204 02/03/17 1613  BP:  (!) 171/92 (!) 153/83 (!) 142/88  Pulse:  75 78 (!) 103  Resp:  17 15 19   Temp:  (!) 97.5 F (36.4 C) 97.6 F (36.4 C) 97.8 F (36.6 C)  TempSrc:  Oral Axillary Oral  SpO2: 100% 100% 100%   Weight:      Height:        Intake/Output Summary (Last 24 hours) at 02/03/2017 1826 Last data filed at 02/03/2017 1532 Gross per 24 hour  Intake 808 ml  Output 2860 ml  Net -2052 ml   Filed Weights   02/01/17 0401 02/02/17 0355 02/03/17 0437  Weight: 95.5 kg (210 lb 8.6 oz) 95.8 kg (211 lb 3.2 oz) 96.1 kg (211 lb 12.8 oz)    Examination:   General: 61 yo CM WD WN NAD. A&O x3 Neurology: Awake and alert, non focal  EENT: positive pallor, no icterus, oral mucosa dry Cardiovascular: No JVD. S1-S2 present, rhythmic, no gallops, rubs, or murmurs. Trace lower extremity edema. Port noted on right side of chest Pulmonary: decreased breath sounds  bilaterally at bases, adequate air movement, no wheezing, rhonchi or rales. Bruising noted on right and left back. Left greater than right. Gastrointestinal. Abdomen obese, non tender, no rebound or guarding Skin. Multiple ecchymosis at the flanks, and upper extremities. Positive ecchymosis at the right first toe.  Musculoskeletal: no joint deformities   Data Reviewed: I have personally reviewed following labs and imaging studies  CBC: Recent Labs  Lab 01/29/17 1128  01/30/17 0546 01/31/17 0416 02/01/17 0510 02/01/17 2107 02/02/17 0408 02/03/17 0610  WBC 7.7   < > 7.8 6.6 5.1  --  5.1 4.5  NEUTROABS 5.5  --  4.6 3.9 3.3  --   --   --   HGB 8.8*   < > 5.8* 8.3* 7.3* 8.2* 9.5* 9.7*  HCT 28.1*   < > 18.7* 24.1* 21.4* 24.5* 28.1* 30.0*  MCV 103.3*   < > 99.5 92.3 94.3  --  92.7 95.5  PLT 44*   < > 33* 24* 21*  --  19* 22*   < > = values in this interval not displayed.   Basic Metabolic Panel: Recent Labs  Lab 01/30/17 0224 01/31/17 0416 02/01/17 0510 02/02/17 0408 02/03/17 0610  NA 141 136 140 139 138  K 3.6 3.3* 3.7 3.8 3.9  CL 98* 99* 106 104 104  CO2 35* 31 30 30  28  GLUCOSE 179* 129* 123* 118* 98  BUN 29* 13 9 11 10   CREATININE 1.28* 0.86 0.73 0.69 0.57*  CALCIUM 8.2* 8.5* 8.9 8.9 9.2   GFR: Estimated Creatinine Clearance: 107.9 mL/min (A) (by C-G formula based on SCr of 0.57 mg/dL (L)). Liver Function Tests: Recent Labs  Lab 01/29/17 1128 01/30/17 0224  AST 36 37  ALT 35 30  ALKPHOS 53 41  BILITOT 0.7 0.8  PROT 5.9* 4.7*  ALBUMIN 3.0* 2.6*   No results for input(s): LIPASE, AMYLASE in the last 168 hours. No results for input(s): AMMONIA in the last 168 hours. Coagulation Profile: Recent Labs  Lab 01/29/17 1128  INR 2.33   Cardiac Enzymes: Recent Labs  Lab 01/29/17 1610  CKTOTAL 35*   BNP (last 3 results) No results for input(s): PROBNP in the last 8760 hours. HbA1C: No results for input(s): HGBA1C in the last 72 hours. CBG: Recent Labs    Lab 02/03/17 0015 02/03/17 0416 02/03/17 0800 02/03/17 1206 02/03/17 1610  GLUCAP 142* 94 96 109* 120*   Lipid Profile: No results for input(s): CHOL, HDL, LDLCALC, TRIG, CHOLHDL, LDLDIRECT in the last 72 hours. Thyroid Function Tests: No results for input(s): TSH, T4TOTAL, FREET4, T3FREE, THYROIDAB in the last 72 hours. Anemia Panel: No results for input(s): VITAMINB12, FOLATE, FERRITIN, TIBC, IRON, RETICCTPCT in the last 72 hours.    Radiology Studies: I have reviewed all of the imaging during this hospital visit personally     Scheduled Meds: . acyclovir  400 mg Oral Daily  . amLODipine  5 mg Oral Daily  . citalopram  40 mg Oral Daily  . fluticasone  1 spray Each Nare Daily  . folic acid  1 mg Oral Daily  . insulin aspart  0-15 Units Subcutaneous Q4H  . LORazepam  1 mg Oral QHS  . mometasone-formoterol  2 puff Inhalation BID  . multivitamin with minerals  1 tablet Oral Daily  . nefazodone  50 mg Oral BID  . rOPINIRole  3 mg Oral QHS  . senna-docusate  1 tablet Oral BID  . Sirolimus  0.5 mg Oral Daily  . sulfamethoxazole-trimethoprim  1 tablet Oral Q M,W,F  . tiotropium  18 mcg Inhalation Daily   Continuous Infusions: . sodium chloride 1,000 mL (02/03/17 1707)     LOS: 5 days        Kayleen Memos, MD [facility] Pager [phone]

## 2017-02-04 DIAGNOSIS — Z9484 Stem cells transplant status: Secondary | ICD-10-CM | POA: Diagnosis not present

## 2017-02-04 DIAGNOSIS — D61818 Other pancytopenia: Secondary | ICD-10-CM | POA: Diagnosis not present

## 2017-02-04 DIAGNOSIS — N179 Acute kidney failure, unspecified: Secondary | ICD-10-CM | POA: Diagnosis not present

## 2017-02-04 DIAGNOSIS — I5022 Chronic systolic (congestive) heart failure: Secondary | ICD-10-CM | POA: Diagnosis not present

## 2017-02-04 DIAGNOSIS — I5042 Chronic combined systolic (congestive) and diastolic (congestive) heart failure: Secondary | ICD-10-CM | POA: Diagnosis not present

## 2017-02-04 DIAGNOSIS — J9622 Acute and chronic respiratory failure with hypercapnia: Secondary | ICD-10-CM | POA: Diagnosis not present

## 2017-02-04 DIAGNOSIS — J9611 Chronic respiratory failure with hypoxia: Secondary | ICD-10-CM | POA: Diagnosis not present

## 2017-02-04 DIAGNOSIS — Y92009 Unspecified place in unspecified non-institutional (private) residence as the place of occurrence of the external cause: Secondary | ICD-10-CM | POA: Diagnosis not present

## 2017-02-04 DIAGNOSIS — E872 Acidosis: Secondary | ICD-10-CM | POA: Diagnosis not present

## 2017-02-04 DIAGNOSIS — C9101 Acute lymphoblastic leukemia, in remission: Secondary | ICD-10-CM | POA: Diagnosis not present

## 2017-02-04 DIAGNOSIS — E874 Mixed disorder of acid-base balance: Secondary | ICD-10-CM | POA: Diagnosis not present

## 2017-02-04 DIAGNOSIS — A419 Sepsis, unspecified organism: Secondary | ICD-10-CM | POA: Diagnosis not present

## 2017-02-04 DIAGNOSIS — I951 Orthostatic hypotension: Secondary | ICD-10-CM | POA: Diagnosis not present

## 2017-02-04 DIAGNOSIS — D89811 Chronic graft-versus-host disease: Secondary | ICD-10-CM | POA: Diagnosis not present

## 2017-02-04 DIAGNOSIS — Z9981 Dependence on supplemental oxygen: Secondary | ICD-10-CM | POA: Diagnosis not present

## 2017-02-04 DIAGNOSIS — J9621 Acute and chronic respiratory failure with hypoxia: Secondary | ICD-10-CM | POA: Diagnosis not present

## 2017-02-04 DIAGNOSIS — I2782 Chronic pulmonary embolism: Secondary | ICD-10-CM | POA: Diagnosis not present

## 2017-02-04 DIAGNOSIS — D801 Nonfamilial hypogammaglobulinemia: Secondary | ICD-10-CM | POA: Diagnosis not present

## 2017-02-04 DIAGNOSIS — Z9481 Bone marrow transplant status: Secondary | ICD-10-CM | POA: Diagnosis not present

## 2017-02-04 LAB — GLUCOSE, CAPILLARY
GLUCOSE-CAPILLARY: 99 mg/dL (ref 65–99)
GLUCOSE-CAPILLARY: 140 mg/dL — AB (ref 65–99)
Glucose-Capillary: 113 mg/dL — ABNORMAL HIGH (ref 65–99)

## 2017-02-04 NOTE — Progress Notes (Signed)
Spoke with [facility]. Charge RN transferred call to receiving RNs number. Again no answer. Report given to Agricultural consultant. No further questions at this time.

## 2017-02-04 NOTE — Progress Notes (Signed)
Attempted to call report to [facility]. No answer at this time. Will try again.

## 2017-02-04 NOTE — Progress Notes (Signed)
Pt states he has decided to go to [facility] called to say there's a bed available to him.  [facility] was informed that the pt will be unable to transfer tonight and asked to keep the available bed open to him.  Lupita Dawn, RN

## 2017-02-04 NOTE — Discharge Instructions (Signed)
Bone Marrow Aspiration and Bone Marrow Biopsy, Adult °Bone marrow aspiration and bone marrow biopsy are procedures that are done to diagnose blood disorders. You may also have one of these procedures to help diagnose infections or some types of cancer. °Bone marrow is the soft tissue that is inside your bones. Blood cells are produced in bone marrow. For bone marrow aspiration, a sample of tissue in liquid form is removed from inside your bone. For a bone marrow biopsy, a small core of bone marrow tissue is removed. These samples are examined under a microscope or tested in a lab. °You may need these procedures if you have an abnormal complete blood count (CBC). The aspiration or biopsy sample is usually taken from the top of your hip bone. Sometimes, an aspiration sample is taken from your chest bone (sternum). °Tell a health care provider about: °· Any allergies you have. °· All medicines you are taking, including vitamins, herbs, eye drops, creams, and over-the-counter medicines. °· Any problems you or family members have had with anesthetic medicines. °· Any blood or bone disorders you have. °· Any surgeries you have had. °· Any medical conditions you have. °· Whether you are pregnant or you think that you may be pregnant. °What are the risks? °Generally, this is a safe procedure. However, problems may occur, including: °· Infection. °· Bleeding. °· Persistent pain after the procedure. °· Cracking (fracture) of the bone. °· Allergic reactions to medicines. ° °What happens before the procedure? °Staying hydrated °Follow instructions from your health care provider about hydration, which may include: °· Up to 2 hours before the procedure - you may continue to drink clear liquids, such as water, clear fruit juice, black coffee, and plain tea. ° °Eating and drinking restrictions °Follow instructions from your health care provider about eating and drinking, which may include: °· 8 hours before the procedure - stop  eating heavy meals or foods such as meat, fried foods, or fatty foods. °· 6 hours before the procedure - stop eating light meals or foods, such as toast or cereal. °· 6 hours before the procedure - stop drinking milk or drinks that contain milk. °· 2 hours before the procedure - stop drinking clear liquids. ° °Medicines °· Ask your health care provider about: °? Changing or stopping your regular medicines. This is especially important if you are taking diabetes medicines or blood thinners. °? Taking medicines such as aspirin and ibuprofen. These medicines can thin your blood. Do not take these medicines before your procedure if your health care provider instructs you not to. °· You may be given antibiotic medicine to help prevent infection. °General instructions °· Plan to have someone take you home after the procedure. °· If you will be going home right after the procedure, plan to have someone with you for 24 hours. °· Ask your health care provider how your surgical site will be marked or identified. °What happens during the procedure? °· To reduce your risk of infection: °? Your health care team will wash or sanitize their hands. °? Your skin will be washed with soap. °? Hair may be removed from the surgical area. °· An IV tube may be inserted into one of your veins. °· The injection site will be cleaned with a germ-killing solution (antiseptic). °· You will be given one or more of the following: °? A medicine to help you relax (sedative). °? A medicine to numb the area (local anesthetic). °? A medicine to make you fall asleep (  asleep (general anesthetic).  The bone marrow sample will be removed as follows: ? For an aspiration, a hollow needle will be inserted through your skin and into your bone. Bone marrow fluid will be drawn up into a syringe. ? For a biopsy, your health care provider will use a hollow needle to remove a core of tissue from your bone marrow.  The needle will be removed.  A bandage (dressing)  will be placed over the insertion site and taped in place. The procedure may vary among health care providers and hospitals. What happens after the procedure?  Your blood pressure, heart rate, breathing rate, and blood oxygen level will be monitored until the medicines you were given have worn off.  Your IV tube will be removed, and the insertion site will be checked for bleeding.  Do not drive for 24 hours if you were given a sedative. This information is not intended to replace advice given to you by your health care provider. Make sure you discuss any questions you have with your health care provider. Document Released: 02/25/2004 Document Revised: 09/11/2015 Document Reviewed: 08/05/2015 Elsevier Interactive Patient Education  2018 [employer].    Outpatient Metformin Instructions (Glucophage, Glucovance, Fortamet, Riomet, [place], [place], Actoplus met  Avandamet, Janumet)   Patient: Derek Shepherd                                                01/29/2017:    Radiology Exam:  CT angio Chest and Ct abd/pelvis with IV contrast   As part of your exam today in the Radiology Department, you were given a radiographic contrast material or x-ray dye.  Because you have had this contrast material and you are taking a Metformin drug (Glucophage, Glucovance, Avandamet, Fortamet, Riomet, Metaglip, Glumetza, Actoplus met, Actoplus Met XR, Prandimet or Janumet), please observe the following instructions:   DO NOT  Take this medication for 48 hours after your exam.  Because you have normal renal function and have no comorbidities, you may restart your medication in 48 hours with no need for a renal function test or consultation with your physician.  You have normal renal function but have some comorbidities.  Comorbidities include liver disease, alcohol overuse, heart failure, myocardial or muscular ischemia, sepsis, or other severe infection.  Therefore you should consult your  physician before restarting your medication.  You have impaired renal function.  You should consult your physician before restarting your medication and you are advised to get a renal function test before restarting your medication.  Please discuss this with your physician.   Call your doctor before you start taking this medication again.  Your doctor may want to check your kidney function before you start taking this medication again.  I understand these instructions and have had an opportunity to discuss them with Radiology Department personnel.

## 2017-02-04 NOTE — Progress Notes (Signed)
[facility] called for transportation. Will continue to monitor.

## 2017-02-04 NOTE — Discharge Summary (Signed)
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 aneurysm or dissection in the abdominal or pelvic vasculature. No lymphadenopathy noted in the abdomen or pelvis. Reproductive: Prostate gland and seminal vesicles are unremarkable in appearance. Other: No significant volume of ascites.  No pneumoperitoneum. Musculoskeletal: There are no aggressive appearing lytic or blastic lesions noted in the visualized portions of the skeleton. Old healed fractures of the left superior and inferior pubic rami. No acute displaced fractures are noted in the visualized portions of the skeleton. Review of the MIP images confirms the above findings. IMPRESSION: 1. No evidence of significant acute traumatic injury to the chest, abdomen or pelvis. 2. Small nonocclusive subsegmental sized embolus in the right lower lobe, as above. This is of doubtful clinical significance. 3. 7 mm nonobstructive calculus in the upper pole collecting system of the left kidney. No ureteral stones or findings of urinary tract  obstruction are noted at this time. 4. Aortic atherosclerosis, in addition to right coronary artery disease. Please note that although the presence of coronary artery calcium documents the presence of coronary artery disease, the severity of this disease and any potential stenosis cannot be assessed on this non-gated CT examination. Assessment for potential risk factor modification, dietary therapy or pharmacologic therapy may be warranted, if clinically indicated. 5. Old posttraumatic changes and postoperative changes, as above. Electronically Signed   By: Vinnie Langton M.D.   On: 01/29/2017 13:58   Dg Chest Port 1 View  Result Date: 01/29/2017 CLINICAL DATA:  Shortness of breath tonight EXAM: PORTABLE CHEST 1 VIEW COMPARISON:  01/29/2017 FINDINGS: Shallow inspiration. Heart size and pulmonary vascularity are normal. Right central venous catheter with tip over the cavoatrial junction region. No airspace disease or consolidation in the lungs. No blunting of costophrenic angles. No pneumothorax. Postoperative changes in the left ribs. IMPRESSION: No active disease. Electronically Signed   By: Lucienne Capers M.D.   On: 01/29/2017 23:08    Microbiology: Recent Results (from the past 240 hour(s))  Urine culture  Status: Abnormal   Collection Time: 01/29/17 11:56 AM  Result Value Ref Range Status   Specimen Description URINE, CLEAN CATCH  Final   Special Requests NONE  Final   Culture MULTIPLE SPECIES PRESENT, SUGGEST RECOLLECTION (A)  Final   Report Status 01/30/2017 FINAL  Final  Blood culture (routine x 2)     Status: Abnormal   Collection Time: 01/29/17 12:22 PM  Result Value Ref Range Status   Specimen Description BLOOD RIGHT ANTECUBITAL  Final   Special Requests   Final    BOTTLES DRAWN AEROBIC AND ANAEROBIC Blood Culture adequate volume   Culture  Setup Time   Final    GRAM POSITIVE COCCI IN CLUSTERS ANAEROBIC BOTTLE ONLY CRITICAL RESULT CALLED TO, READ BACK BY AND VERIFIED WITH:  M MACCIA,PHARMD AT 1884 01/30/17 BY L BENFIELD    Culture (A)  Final    STAPHYLOCOCCUS SPECIES (COAGULASE NEGATIVE) THE SIGNIFICANCE OF ISOLATING THIS ORGANISM FROM A SINGLE SET OF BLOOD CULTURES WHEN MULTIPLE SETS ARE DRAWN IS UNCERTAIN. PLEASE NOTIFY THE MICROBIOLOGY DEPARTMENT WITHIN ONE WEEK IF SPECIATION AND SENSITIVITIES ARE REQUIRED.    Report Status 02/01/2017 FINAL  Final  Blood Culture ID Panel (Reflexed)     Status: Abnormal   Collection Time: 01/29/17 12:22 PM  Result Value Ref Range Status   Enterococcus species NOT DETECTED NOT DETECTED Final   Listeria monocytogenes NOT DETECTED NOT DETECTED Final   Staphylococcus species DETECTED (A) NOT DETECTED Final    Comment: Methicillin (oxacillin) resistant coagulase negative staphylococcus. Possible blood culture contaminant (unless isolated from more than one blood culture draw or clinical case suggests pathogenicity). No antibiotic treatment is indicated for blood  culture contaminants. CRITICAL RESULT CALLED TO, READ BACK BY AND VERIFIED WITH: M MACCIA,PHARMD AT 1660 01/30/17 BY L BENFIELD    Staphylococcus aureus NOT DETECTED NOT DETECTED Final   Methicillin resistance DETECTED (A) NOT DETECTED Final    Comment: CRITICAL RESULT CALLED TO, READ BACK BY AND VERIFIED WITH: M MACCIA,PHARMD AT 0845 01/30/17 BY L BENFIELD    Streptococcus species NOT DETECTED NOT DETECTED Final   Streptococcus agalactiae NOT DETECTED NOT DETECTED Final   Streptococcus pneumoniae NOT DETECTED NOT DETECTED Final   Streptococcus pyogenes NOT DETECTED NOT DETECTED Final   Acinetobacter baumannii NOT DETECTED NOT DETECTED Final   Enterobacteriaceae species NOT DETECTED NOT DETECTED Final   Enterobacter cloacae complex NOT DETECTED NOT DETECTED Final   Escherichia coli NOT DETECTED NOT DETECTED Final   Klebsiella oxytoca NOT DETECTED NOT DETECTED Final   Klebsiella pneumoniae NOT DETECTED NOT DETECTED Final   Proteus species NOT DETECTED NOT DETECTED  Final   Serratia marcescens NOT DETECTED NOT DETECTED Final   Haemophilus influenzae NOT DETECTED NOT DETECTED Final   Neisseria meningitidis NOT DETECTED NOT DETECTED Final   Pseudomonas aeruginosa NOT DETECTED NOT DETECTED Final   Candida albicans NOT DETECTED NOT DETECTED Final   Candida glabrata NOT DETECTED NOT DETECTED Final   Candida krusei NOT DETECTED NOT DETECTED Final   Candida parapsilosis NOT DETECTED NOT DETECTED Final   Candida tropicalis NOT DETECTED NOT DETECTED Final  Respiratory Panel by PCR     Status: None   Collection Time: 01/29/17  1:49 PM  Result Value Ref Range Status   Adenovirus NOT DETECTED NOT DETECTED Final   Coronavirus 229E NOT DETECTED NOT DETECTED Final   Coronavirus HKU1 NOT DETECTED NOT DETECTED Final   Coronavirus NL63 NOT DETECTED NOT DETECTED Final   Coronavirus OC43 NOT DETECTED NOT DETECTED Final  Metapneumovirus NOT DETECTED NOT DETECTED Final   Rhinovirus / Enterovirus NOT DETECTED NOT DETECTED Final   Influenza A NOT DETECTED NOT DETECTED Final   Influenza B NOT DETECTED NOT DETECTED Final   Parainfluenza Virus 1 NOT DETECTED NOT DETECTED Final   Parainfluenza Virus 2 NOT DETECTED NOT DETECTED Final   Parainfluenza Virus 3 NOT DETECTED NOT DETECTED Final   Parainfluenza Virus 4 NOT DETECTED NOT DETECTED Final   Respiratory Syncytial Virus NOT DETECTED NOT DETECTED Final   Bordetella pertussis NOT DETECTED NOT DETECTED Final   Chlamydophila pneumoniae NOT DETECTED NOT DETECTED Final   Mycoplasma pneumoniae NOT DETECTED NOT DETECTED Final  Blood culture (routine x 2)     Status: None   Collection Time: 01/29/17  4:10 PM  Result Value Ref Range Status   Specimen Description BLOOD LEFT ANTECUBITAL  Final   Special Requests   Final    IN BOTH AEROBIC AND ANAEROBIC BOTTLES Blood Culture adequate volume   Culture NO GROWTH 5 DAYS  Final   Report Status 02/03/2017 FINAL  Final     Labs: Basic Metabolic Panel: Recent Labs  Lab  01/30/17 0224 01/31/17 0416 02/01/17 0510 02/02/17 0408 02/03/17 0610  NA 141 136 140 139 138  K 3.6 3.3* 3.7 3.8 3.9  CL 98* 99* 106 104 104  CO2 35* 31 30 30 28   GLUCOSE 179* 129* 123* 118* 98  BUN 29* 13 9 11 10   CREATININE 1.28* 0.86 0.73 0.69 0.57*  CALCIUM 8.2* 8.5* 8.9 8.9 9.2   Liver Function Tests: Recent Labs  Lab 01/29/17 1128 01/30/17 0224  AST 36 37  ALT 35 30  ALKPHOS 53 41  BILITOT 0.7 0.8  PROT 5.9* 4.7*  ALBUMIN 3.0* 2.6*   No results for input(s): LIPASE, AMYLASE in the last 168 hours. No results for input(s): AMMONIA in the last 168 hours. CBC: Recent Labs  Lab 01/29/17 1128  01/30/17 0546 01/31/17 0416 02/01/17 0510 02/01/17 2107 02/02/17 0408 02/03/17 0610  WBC 7.7   < > 7.8 6.6 5.1  --  5.1 4.5  NEUTROABS 5.5  --  4.6 3.9 3.3  --   --   --   HGB 8.8*   < > 5.8* 8.3* 7.3* 8.2* 9.5* 9.7*  HCT 28.1*   < > 18.7* 24.1* 21.4* 24.5* 28.1* 30.0*  MCV 103.3*   < > 99.5 92.3 94.3  --  92.7 95.5  PLT 44*   < > 33* 24* 21*  --  19* 22*   < > = values in this interval not displayed.   Cardiac Enzymes: Recent Labs  Lab 01/29/17 1610  CKTOTAL 35*   BNP: BNP (last 3 results) Recent Labs    07/19/16 1126 11/09/16 1156 01/01/17 2342  BNP 25.0 35.1 140.6*    ProBNP (last 3 results) No results for input(s): PROBNP in the last 8760 hours.  CBG: Recent Labs  Lab 02/03/17 1206 02/03/17 1610 02/03/17 2037 02/03/17 2343 02/04/17 0416  GLUCAP 109* 120* 141* 170* 113*       Signed:  Kayleen Memos, MD [facility] 02/04/2017, 7:48 AM

## 2017-02-05 DIAGNOSIS — Z9484 Stem cells transplant status: Secondary | ICD-10-CM | POA: Diagnosis not present

## 2017-02-05 DIAGNOSIS — D89811 Chronic graft-versus-host disease: Secondary | ICD-10-CM | POA: Diagnosis not present

## 2017-02-05 DIAGNOSIS — D89813 Graft-versus-host disease, unspecified: Secondary | ICD-10-CM | POA: Diagnosis not present

## 2017-02-05 DIAGNOSIS — R55 Syncope and collapse: Secondary | ICD-10-CM | POA: Diagnosis not present

## 2017-02-05 DIAGNOSIS — Z86711 Personal history of pulmonary embolism: Secondary | ICD-10-CM | POA: Diagnosis not present

## 2017-02-05 DIAGNOSIS — C9101 Acute lymphoblastic leukemia, in remission: Secondary | ICD-10-CM | POA: Diagnosis not present

## 2017-02-05 DIAGNOSIS — S301XXA Contusion of abdominal wall, initial encounter: Secondary | ICD-10-CM | POA: Diagnosis not present

## 2017-02-05 DIAGNOSIS — D696 Thrombocytopenia, unspecified: Secondary | ICD-10-CM | POA: Diagnosis not present

## 2017-02-05 DIAGNOSIS — I5042 Chronic combined systolic (congestive) and diastolic (congestive) heart failure: Secondary | ICD-10-CM | POA: Diagnosis not present

## 2017-02-05 DIAGNOSIS — D539 Nutritional anemia, unspecified: Secondary | ICD-10-CM | POA: Diagnosis not present

## 2017-02-06 DIAGNOSIS — Z9981 Dependence on supplemental oxygen: Secondary | ICD-10-CM | POA: Diagnosis not present

## 2017-02-06 DIAGNOSIS — Z86711 Personal history of pulmonary embolism: Secondary | ICD-10-CM | POA: Diagnosis not present

## 2017-02-06 DIAGNOSIS — D89811 Chronic graft-versus-host disease: Secondary | ICD-10-CM | POA: Diagnosis not present

## 2017-02-06 DIAGNOSIS — Z856 Personal history of leukemia: Secondary | ICD-10-CM | POA: Diagnosis not present

## 2017-02-06 DIAGNOSIS — D696 Thrombocytopenia, unspecified: Secondary | ICD-10-CM | POA: Diagnosis not present

## 2017-02-06 DIAGNOSIS — E119 Type 2 diabetes mellitus without complications: Secondary | ICD-10-CM | POA: Diagnosis not present

## 2017-02-06 DIAGNOSIS — I5030 Unspecified diastolic (congestive) heart failure: Secondary | ICD-10-CM | POA: Diagnosis not present

## 2017-02-06 DIAGNOSIS — J8489 Other specified interstitial pulmonary diseases: Secondary | ICD-10-CM | POA: Diagnosis not present

## 2017-02-06 DIAGNOSIS — I951 Orthostatic hypotension: Secondary | ICD-10-CM | POA: Diagnosis not present

## 2017-02-06 DIAGNOSIS — D539 Nutritional anemia, unspecified: Secondary | ICD-10-CM | POA: Diagnosis not present

## 2017-02-06 DIAGNOSIS — S301XXA Contusion of abdominal wall, initial encounter: Secondary | ICD-10-CM | POA: Diagnosis not present

## 2017-02-06 DIAGNOSIS — Z9484 Stem cells transplant status: Secondary | ICD-10-CM | POA: Diagnosis not present

## 2017-02-06 DIAGNOSIS — J9611 Chronic respiratory failure with hypoxia: Secondary | ICD-10-CM | POA: Diagnosis not present

## 2017-02-06 DIAGNOSIS — I5042 Chronic combined systolic (congestive) and diastolic (congestive) heart failure: Secondary | ICD-10-CM | POA: Diagnosis not present

## 2017-02-06 DIAGNOSIS — D649 Anemia, unspecified: Secondary | ICD-10-CM | POA: Diagnosis not present

## 2017-02-06 DIAGNOSIS — R55 Syncope and collapse: Secondary | ICD-10-CM | POA: Diagnosis not present

## 2017-02-06 DIAGNOSIS — D89813 Graft-versus-host disease, unspecified: Secondary | ICD-10-CM | POA: Diagnosis not present

## 2017-02-06 LAB — KAPPA/LAMBDA LIGHT CHAINS
Kappa free light chain: 9.5 mg/L (ref 3.3–19.4)
Kappa, lambda light chain ratio: 1.23 (ref 0.26–1.65)
Lambda free light chains: 7.7 mg/L (ref 5.7–26.3)

## 2017-02-06 LAB — MULTIPLE MYELOMA PANEL, SERUM
ALBUMIN SERPL ELPH-MCNC: 2.5 g/dL — AB (ref 2.9–4.4)
ALPHA 1: 0.2 g/dL (ref 0.0–0.4)
Albumin/Glob SerPl: 1.1 (ref 0.7–1.7)
Alpha2 Glob SerPl Elph-Mcnc: 0.8 g/dL (ref 0.4–1.0)
B-Globulin SerPl Elph-Mcnc: 0.9 g/dL (ref 0.7–1.3)
Gamma Glob SerPl Elph-Mcnc: 0.5 g/dL (ref 0.4–1.8)
Globulin, Total: 2.5 g/dL (ref 2.2–3.9)
IGA: 48 mg/dL — AB (ref 90–386)
IGM (IMMUNOGLOBULIN M), SRM: 35 mg/dL (ref 20–172)
IgG (Immunoglobin G), Serum: 506 mg/dL — ABNORMAL LOW (ref 700–1600)
TOTAL PROTEIN ELP: 5 g/dL — AB (ref 6.0–8.5)

## 2017-02-07 DIAGNOSIS — I951 Orthostatic hypotension: Secondary | ICD-10-CM | POA: Diagnosis not present

## 2017-02-07 DIAGNOSIS — S301XXA Contusion of abdominal wall, initial encounter: Secondary | ICD-10-CM | POA: Diagnosis not present

## 2017-02-07 DIAGNOSIS — Z9484 Stem cells transplant status: Secondary | ICD-10-CM | POA: Diagnosis not present

## 2017-02-07 DIAGNOSIS — R55 Syncope and collapse: Secondary | ICD-10-CM | POA: Diagnosis not present

## 2017-02-07 DIAGNOSIS — D539 Nutritional anemia, unspecified: Secondary | ICD-10-CM | POA: Diagnosis not present

## 2017-02-07 DIAGNOSIS — D696 Thrombocytopenia, unspecified: Secondary | ICD-10-CM | POA: Diagnosis not present

## 2017-02-07 DIAGNOSIS — I5042 Chronic combined systolic (congestive) and diastolic (congestive) heart failure: Secondary | ICD-10-CM | POA: Diagnosis not present

## 2017-02-07 DIAGNOSIS — Z86711 Personal history of pulmonary embolism: Secondary | ICD-10-CM | POA: Diagnosis not present

## 2017-02-07 DIAGNOSIS — J42 Unspecified chronic bronchitis: Secondary | ICD-10-CM | POA: Diagnosis not present

## 2017-02-07 DIAGNOSIS — Z856 Personal history of leukemia: Secondary | ICD-10-CM | POA: Diagnosis not present

## 2017-02-07 DIAGNOSIS — D89811 Chronic graft-versus-host disease: Secondary | ICD-10-CM | POA: Diagnosis not present

## 2017-02-07 DIAGNOSIS — C9101 Acute lymphoblastic leukemia, in remission: Secondary | ICD-10-CM | POA: Diagnosis not present

## 2017-02-08 DIAGNOSIS — K089 Disorder of teeth and supporting structures, unspecified: Secondary | ICD-10-CM | POA: Diagnosis not present

## 2017-02-08 DIAGNOSIS — R220 Localized swelling, mass and lump, head: Secondary | ICD-10-CM | POA: Diagnosis not present

## 2017-02-08 DIAGNOSIS — E872 Acidosis: Secondary | ICD-10-CM | POA: Diagnosis not present

## 2017-02-08 DIAGNOSIS — L0291 Cutaneous abscess, unspecified: Secondary | ICD-10-CM | POA: Diagnosis not present

## 2017-02-08 DIAGNOSIS — R609 Edema, unspecified: Secondary | ICD-10-CM | POA: Diagnosis not present

## 2017-02-08 DIAGNOSIS — L080 Pyoderma: Secondary | ICD-10-CM | POA: Diagnosis not present

## 2017-02-08 DIAGNOSIS — R0602 Shortness of breath: Secondary | ICD-10-CM | POA: Diagnosis not present

## 2017-02-08 DIAGNOSIS — C9110 Chronic lymphocytic leukemia of B-cell type not having achieved remission: Secondary | ICD-10-CM | POA: Diagnosis not present

## 2017-02-08 DIAGNOSIS — I5042 Chronic combined systolic (congestive) and diastolic (congestive) heart failure: Secondary | ICD-10-CM | POA: Diagnosis not present

## 2017-02-08 DIAGNOSIS — D89813 Graft-versus-host disease, unspecified: Secondary | ICD-10-CM | POA: Diagnosis not present

## 2017-02-08 DIAGNOSIS — R69 Illness, unspecified: Secondary | ICD-10-CM | POA: Diagnosis not present

## 2017-02-08 DIAGNOSIS — Z9981 Dependence on supplemental oxygen: Secondary | ICD-10-CM | POA: Diagnosis not present

## 2017-02-08 DIAGNOSIS — D801 Nonfamilial hypogammaglobulinemia: Secondary | ICD-10-CM | POA: Diagnosis not present

## 2017-02-08 DIAGNOSIS — R5081 Fever presenting with conditions classified elsewhere: Secondary | ICD-10-CM | POA: Diagnosis not present

## 2017-02-08 DIAGNOSIS — C9100 Acute lymphoblastic leukemia not having achieved remission: Secondary | ICD-10-CM | POA: Diagnosis not present

## 2017-02-08 DIAGNOSIS — K122 Cellulitis and abscess of mouth: Secondary | ICD-10-CM | POA: Diagnosis not present

## 2017-02-08 DIAGNOSIS — Z856 Personal history of leukemia: Secondary | ICD-10-CM | POA: Diagnosis not present

## 2017-02-08 DIAGNOSIS — R509 Fever, unspecified: Secondary | ICD-10-CM | POA: Diagnosis not present

## 2017-02-08 DIAGNOSIS — M272 Inflammatory conditions of jaws: Secondary | ICD-10-CM | POA: Diagnosis not present

## 2017-02-08 DIAGNOSIS — D89811 Chronic graft-versus-host disease: Secondary | ICD-10-CM | POA: Diagnosis not present

## 2017-02-08 DIAGNOSIS — R55 Syncope and collapse: Secondary | ICD-10-CM | POA: Diagnosis not present

## 2017-02-08 DIAGNOSIS — I2782 Chronic pulmonary embolism: Secondary | ICD-10-CM | POA: Diagnosis not present

## 2017-02-08 DIAGNOSIS — I517 Cardiomegaly: Secondary | ICD-10-CM | POA: Diagnosis not present

## 2017-02-08 DIAGNOSIS — Y92009 Unspecified place in unspecified non-institutional (private) residence as the place of occurrence of the external cause: Secondary | ICD-10-CM | POA: Diagnosis not present

## 2017-02-08 DIAGNOSIS — R262 Difficulty in walking, not elsewhere classified: Secondary | ICD-10-CM | POA: Diagnosis not present

## 2017-02-08 DIAGNOSIS — D696 Thrombocytopenia, unspecified: Secondary | ICD-10-CM | POA: Diagnosis not present

## 2017-02-08 DIAGNOSIS — D61818 Other pancytopenia: Secondary | ICD-10-CM | POA: Diagnosis not present

## 2017-02-08 DIAGNOSIS — L0211 Cutaneous abscess of neck: Secondary | ICD-10-CM | POA: Diagnosis not present

## 2017-02-08 DIAGNOSIS — S301XXA Contusion of abdominal wall, initial encounter: Secondary | ICD-10-CM | POA: Diagnosis not present

## 2017-02-08 DIAGNOSIS — R2231 Localized swelling, mass and lump, right upper limb: Secondary | ICD-10-CM | POA: Diagnosis not present

## 2017-02-08 DIAGNOSIS — L0201 Cutaneous abscess of face: Secondary | ICD-10-CM | POA: Diagnosis not present

## 2017-02-08 DIAGNOSIS — L989 Disorder of the skin and subcutaneous tissue, unspecified: Secondary | ICD-10-CM | POA: Diagnosis not present

## 2017-02-08 DIAGNOSIS — S025XXA Fracture of tooth (traumatic), initial encounter for closed fracture: Secondary | ICD-10-CM | POA: Diagnosis not present

## 2017-02-08 DIAGNOSIS — D709 Neutropenia, unspecified: Secondary | ICD-10-CM | POA: Diagnosis not present

## 2017-02-08 DIAGNOSIS — Z9484 Stem cells transplant status: Secondary | ICD-10-CM | POA: Diagnosis not present

## 2017-02-08 DIAGNOSIS — I951 Orthostatic hypotension: Secondary | ICD-10-CM | POA: Diagnosis not present

## 2017-02-08 DIAGNOSIS — J9610 Chronic respiratory failure, unspecified whether with hypoxia or hypercapnia: Secondary | ICD-10-CM | POA: Diagnosis not present

## 2017-02-08 DIAGNOSIS — D539 Nutritional anemia, unspecified: Secondary | ICD-10-CM | POA: Diagnosis not present

## 2017-02-08 DIAGNOSIS — L02811 Cutaneous abscess of head [any part, except face]: Secondary | ICD-10-CM | POA: Diagnosis not present

## 2017-02-08 DIAGNOSIS — E119 Type 2 diabetes mellitus without complications: Secondary | ICD-10-CM | POA: Diagnosis not present

## 2017-02-08 DIAGNOSIS — J9611 Chronic respiratory failure with hypoxia: Secondary | ICD-10-CM | POA: Diagnosis not present

## 2017-02-08 DIAGNOSIS — I5041 Acute combined systolic (congestive) and diastolic (congestive) heart failure: Secondary | ICD-10-CM | POA: Diagnosis not present

## 2017-02-08 DIAGNOSIS — I700 Atherosclerosis of aorta: Secondary | ICD-10-CM | POA: Diagnosis not present

## 2017-02-08 DIAGNOSIS — C9101 Acute lymphoblastic leukemia, in remission: Secondary | ICD-10-CM | POA: Diagnosis not present

## 2017-02-08 DIAGNOSIS — I5030 Unspecified diastolic (congestive) heart failure: Secondary | ICD-10-CM | POA: Diagnosis not present

## 2017-02-08 DIAGNOSIS — R221 Localized swelling, mass and lump, neck: Secondary | ICD-10-CM | POA: Diagnosis not present

## 2017-02-08 DIAGNOSIS — M7989 Other specified soft tissue disorders: Secondary | ICD-10-CM | POA: Diagnosis not present

## 2017-02-08 DIAGNOSIS — J42 Unspecified chronic bronchitis: Secondary | ICD-10-CM | POA: Diagnosis not present

## 2017-02-08 DIAGNOSIS — Z86711 Personal history of pulmonary embolism: Secondary | ICD-10-CM | POA: Diagnosis not present

## 2017-02-23 ENCOUNTER — Other Ambulatory Visit: Payer: Self-pay | Admitting: Family Medicine

## 2017-02-23 MED ORDER — PANTOPRAZOLE SODIUM 40 MG PO TBEC
40.00 mg | DELAYED_RELEASE_TABLET | ORAL | Status: DC
Start: 2017-02-23 — End: 2017-02-23

## 2017-02-23 MED ORDER — TORSEMIDE 10 MG PO TABS
50.00 mg | ORAL_TABLET | ORAL | Status: DC
Start: 2017-02-23 — End: 2017-02-23

## 2017-02-23 MED ORDER — SENNOSIDES-DOCUSATE SODIUM 8.6-50 MG PO TABS
1.00 | ORAL_TABLET | ORAL | Status: DC
Start: 2017-02-23 — End: 2017-02-23

## 2017-02-23 MED ORDER — CLONAZEPAM 0.5 MG PO TABS
0.25 mg | ORAL_TABLET | ORAL | Status: DC
Start: 2017-02-23 — End: 2017-02-23

## 2017-02-23 MED ORDER — OXYMETAZOLINE HCL 0.05 % NA SOLN
2.00 | NASAL | Status: DC
Start: ? — End: 2017-02-23

## 2017-02-23 MED ORDER — ALBUTEROL SULFATE HFA 108 (90 BASE) MCG/ACT IN AERS
2.00 | INHALATION_SPRAY | RESPIRATORY_TRACT | Status: DC
Start: ? — End: 2017-02-23

## 2017-02-23 MED ORDER — GENERIC EXTERNAL MEDICATION
3.00 mg | Status: DC
Start: 2017-02-23 — End: 2017-02-23

## 2017-02-23 MED ORDER — TRIAMCINOLONE ACETONIDE 0.1 % EX OINT
TOPICAL_OINTMENT | CUTANEOUS | Status: DC
Start: 2017-02-23 — End: 2017-02-23

## 2017-02-23 MED ORDER — MONTELUKAST SODIUM 10 MG PO TABS
10.00 mg | ORAL_TABLET | ORAL | Status: DC
Start: 2017-02-23 — End: 2017-02-23

## 2017-02-23 MED ORDER — GENERIC EXTERNAL MEDICATION
10.00 | Status: DC
Start: 2017-02-23 — End: 2017-02-23

## 2017-02-23 MED ORDER — INSULIN LISPRO 100 UNIT/ML ~~LOC~~ SOLN
2.00 | SUBCUTANEOUS | Status: DC
Start: 2017-02-23 — End: 2017-02-23

## 2017-02-23 MED ORDER — CITALOPRAM HYDROBROMIDE 20 MG PO TABS
40.00 mg | ORAL_TABLET | ORAL | Status: DC
Start: 2017-02-23 — End: 2017-02-23

## 2017-02-23 MED ORDER — DEXTROSE 10 % IV SOLN
125.00 mL | INTRAVENOUS | Status: DC
Start: ? — End: 2017-02-23

## 2017-02-23 MED ORDER — GENERIC EXTERNAL MEDICATION
1.00 | Status: DC
Start: ? — End: 2017-02-23

## 2017-02-23 MED ORDER — SULFAMETHOXAZOLE-TRIMETHOPRIM 800-160 MG PO TABS
1.00 | ORAL_TABLET | ORAL | Status: DC
Start: 2017-02-24 — End: 2017-02-23

## 2017-02-23 MED ORDER — FOLIC ACID 1 MG PO TABS
1.00 mg | ORAL_TABLET | ORAL | Status: DC
Start: 2017-02-23 — End: 2017-02-23

## 2017-02-23 MED ORDER — METOPROLOL TARTRATE 25 MG PO TABS
12.50 mg | ORAL_TABLET | ORAL | Status: DC
Start: 2017-02-23 — End: 2017-02-23

## 2017-02-23 MED ORDER — TIOTROPIUM BROMIDE MONOHYDRATE 18 MCG IN CAPS
1.00 | ORAL_CAPSULE | RESPIRATORY_TRACT | Status: DC
Start: 2017-02-23 — End: 2017-02-23

## 2017-02-23 MED ORDER — GENERIC EXTERNAL MEDICATION
2.00 | Status: DC
Start: ? — End: 2017-02-23

## 2017-02-23 MED ORDER — GENERIC EXTERNAL MEDICATION
2.00 | Status: DC
Start: 2017-02-23 — End: 2017-02-23

## 2017-02-23 MED ORDER — DIPHENHYDRAMINE HCL 50 MG/ML IJ SOLN
12.50 mg | INTRAMUSCULAR | Status: DC
Start: ? — End: 2017-02-23

## 2017-02-23 MED ORDER — HYDROMORPHONE HCL 2 MG PO TABS
2.00 mg | ORAL_TABLET | ORAL | Status: DC
Start: ? — End: 2017-02-23

## 2017-02-23 MED ORDER — IPRATROPIUM BROMIDE 0.02 % IN SOLN
.50 mg | RESPIRATORY_TRACT | Status: DC
Start: ? — End: 2017-02-23

## 2017-02-23 MED ORDER — MELATONIN 3 MG PO TABS
6.00 mg | ORAL_TABLET | ORAL | Status: DC
Start: 2017-02-23 — End: 2017-02-23

## 2017-02-23 MED ORDER — ACYCLOVIR 400 MG PO TABS
400.00 mg | ORAL_TABLET | ORAL | Status: DC
Start: 2017-02-23 — End: 2017-02-23

## 2017-03-05 DIAGNOSIS — R0902 Hypoxemia: Secondary | ICD-10-CM | POA: Diagnosis not present

## 2017-03-08 DIAGNOSIS — R69 Illness, unspecified: Secondary | ICD-10-CM | POA: Diagnosis not present

## 2017-03-08 DIAGNOSIS — J189 Pneumonia, unspecified organism: Secondary | ICD-10-CM | POA: Diagnosis not present

## 2017-03-08 DIAGNOSIS — Z86718 Personal history of other venous thrombosis and embolism: Secondary | ICD-10-CM | POA: Diagnosis not present

## 2017-03-08 DIAGNOSIS — C9101 Acute lymphoblastic leukemia, in remission: Secondary | ICD-10-CM | POA: Diagnosis not present

## 2017-03-08 DIAGNOSIS — G8929 Other chronic pain: Secondary | ICD-10-CM | POA: Diagnosis not present

## 2017-03-08 DIAGNOSIS — D89811 Chronic graft-versus-host disease: Secondary | ICD-10-CM | POA: Diagnosis not present

## 2017-03-08 DIAGNOSIS — G4700 Insomnia, unspecified: Secondary | ICD-10-CM | POA: Diagnosis not present

## 2017-03-08 DIAGNOSIS — Z7901 Long term (current) use of anticoagulants: Secondary | ICD-10-CM | POA: Diagnosis not present

## 2017-03-08 DIAGNOSIS — J42 Unspecified chronic bronchitis: Secondary | ICD-10-CM | POA: Diagnosis not present

## 2017-03-10 DIAGNOSIS — G8929 Other chronic pain: Secondary | ICD-10-CM | POA: Diagnosis not present

## 2017-03-10 DIAGNOSIS — G894 Chronic pain syndrome: Secondary | ICD-10-CM | POA: Diagnosis not present

## 2017-03-10 DIAGNOSIS — M25562 Pain in left knee: Secondary | ICD-10-CM | POA: Diagnosis not present

## 2017-03-10 DIAGNOSIS — M79605 Pain in left leg: Secondary | ICD-10-CM | POA: Diagnosis not present

## 2017-03-14 ENCOUNTER — Other Ambulatory Visit: Payer: Self-pay | Admitting: Family Medicine

## 2017-03-14 DIAGNOSIS — E1165 Type 2 diabetes mellitus with hyperglycemia: Secondary | ICD-10-CM

## 2017-03-28 ENCOUNTER — Ambulatory Visit (INDEPENDENT_AMBULATORY_CARE_PROVIDER_SITE_OTHER): Payer: Medicare HMO | Admitting: Family Medicine

## 2017-03-28 ENCOUNTER — Encounter: Payer: Self-pay | Admitting: Family Medicine

## 2017-03-28 VITALS — BP 111/69 | HR 84 | Temp 98.3°F | Ht 66.0 in | Wt 208.2 lb

## 2017-03-28 DIAGNOSIS — E1165 Type 2 diabetes mellitus with hyperglycemia: Secondary | ICD-10-CM | POA: Diagnosis not present

## 2017-03-28 DIAGNOSIS — J431 Panlobular emphysema: Secondary | ICD-10-CM | POA: Diagnosis not present

## 2017-03-28 DIAGNOSIS — E119 Type 2 diabetes mellitus without complications: Secondary | ICD-10-CM

## 2017-03-28 LAB — BAYER DCA HB A1C WAIVED: HB A1C (BAYER DCA - WAIVED): 5.7 % (ref <7.0)

## 2017-03-28 MED ORDER — PANTOPRAZOLE SODIUM 40 MG PO TBEC: 40.0000 mg | DELAYED_RELEASE_TABLET | Freq: Every day | ORAL | 1 refills | Status: AC

## 2017-03-28 MED ORDER — METFORMIN HCL 500 MG PO TABS: 500.0000 mg | ORAL_TABLET | Freq: Two times a day (BID) | ORAL | 1 refills | Status: DC

## 2017-03-28 MED ORDER — VENLAFAXINE HCL 75 MG PO TABS: ORAL_TABLET | ORAL | 1 refills | Status: DC

## 2017-03-28 MED ORDER — POTASSIUM CHLORIDE ER 10 MEQ PO CPCR: ORAL_CAPSULE | ORAL | 5 refills | Status: AC

## 2017-03-28 NOTE — Progress Notes (Signed)
Subjective:  Patient ID: Derek Shepherd, male    DOB: 1955/11/28  Age: 62 y.o. MRN: [id number]  CC: Follow-up (pt here today for routine follow up of his chronic medical conditions and has also been in the hospital since we last saw him)   HPI Derek Shepherd presents for follow-up from hospitalization for his COPD and bronchiectasis.  He was told this time that his lung situation is grave and he is to be referred to [facility] for a lung transplant evaluation.  That is his last toe is what he says today.  Follow-up of diabetes. Patient does not check blood sugar at home Patient denies symptoms such as polyuria, polydipsia, excessive hunger, nausea No significant hypoglycemic spells noted. Medications as noted below. Taking them regularly without complication/adverse reaction being reported today.  Patient has problems with chronic depression related to the chronic health issues from his COPD and from his previous bout with cancer.  See below.  He does not feel like the citalopram is doing the trick currently.  He would like to try something new or different  Depression screen [facility] 2/9 03/28/2017 01/23/2017 12/20/2016  Decreased Interest _0 Down, Depressed, Hopeless _1 PHQ - 2 Score _2 Altered sleeping _3 Tired, decreased energy _4 Change in appetite _5 Feeling bad or failure about yourself  _6 Trouble concentrating _7 Moving slowly or fidgety/restless _8 Suicidal thoughts 0 0 0  PHQ-9 Score _9 Difficult doing work/chores - - -  Some recent data might be hidden    History Derek Shepherd has a past medical history of Adenomatous colon polyp, Anemia, Anxiety, Arthritis, Asthma, Bowel obstruction ([place]), CHF (congestive heart failure) ([place]), Complication of anesthesia, COPD (chronic obstructive pulmonary disease) ([place]), Depression, Diabetes mellitus without complication ([place]), Diverticulosis, DVT (deep venous thrombosis) ([place]), Gallstones, GERD  (gastroesophageal reflux disease), 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   He has a past surgical history that includes Cholecystectomy; Lung surgery (Left); Exploratory laparotomy; Hernia repair; Colonoscopy w/ biopsies; and Bone marrow transplant (2011).   His family history includes Clotting disorder in his mother; Diabetes in his brother, father, maternal aunt, and sister; Heart attack in his father and mother; Prostate cancer in his brother.He reports that he quit smoking about 23 years ago. His smoking use included cigarettes. he has never used smokeless tobacco. He reports that he drinks alcohol. He reports that he does not use drugs.    ROS Review of Systems  Constitutional: Negative for chills, diaphoresis, fever and unexpected weight change.  HENT: Negative for congestion, hearing loss, rhinorrhea and sore throat.   Eyes: Negative for visual disturbance.  Respiratory: Positive for shortness of breath and wheezing. Negative for cough.   Cardiovascular: Negative for chest pain.  Gastrointestinal: Negative for abdominal pain, constipation and diarrhea.  Genitourinary: Negative for dysuria and flank pain.  Musculoskeletal: Negative for arthralgias and joint swelling.  Skin: Negative for rash.  Neurological: Negative for dizziness and headaches.  Psychiatric/Behavioral: Positive for decreased concentration, dysphoric mood and sleep disturbance. Negative for behavioral problems. The patient is nervous/anxious. The patient is not hyperactive.     Objective:  BP 111/69   Pulse 84   Temp 98.3 F (36.8 C) (  Oral)   Ht _0  (1.676 m)   Wt 208 lb 4 oz (94.5 kg)   BMI 33.61 kg/m   BP Readings from Last 3 Encounters:  03/28/17 111/69  02/04/17 136/80    01/23/17 (!) 141/80    Wt Readings from Last 3 Encounters:  03/28/17 208 lb 4 oz (94.5 kg)  02/04/17 214 lb 11.7 oz (97.4 kg)  01/23/17 195 lb (88.5 kg)     Physical Exam  Constitutional: He is oriented to person, place, and time. He appears well-developed and well-nourished. No distress.  Patient looks chronically ill and tired.  HENT:  Head: Normocephalic and atraumatic.  Right Ear: External ear normal.  Left Ear: External ear normal.  Nose: Nose normal.  Mouth/Throat: Oropharynx is clear and moist.  Eyes: Conjunctivae and EOM are normal. Pupils are equal, round, and reactive to light.  Neck: Normal range of motion. Neck supple. No thyromegaly present.  Cardiovascular: Normal rate, regular rhythm and normal heart sounds.  No murmur heard. Pulmonary/Chest: Effort normal and breath sounds normal. No respiratory distress. He has no wheezes. He has no rales.  Abdominal: Soft. Bowel sounds are normal. He exhibits no distension. There is no tenderness.  Lymphadenopathy:    He has no cervical adenopathy.  Neurological: He is alert and oriented to person, place, and time. He has normal reflexes.  Skin: Skin is warm and dry.  Psychiatric: Judgment and thought content normal. His mood appears anxious. His affect is blunt. His speech is delayed. He is slowed. Cognition and memory are normal.      Assessment & Plan:   Derek Shepherd was seen today for follow-up.  Diagnoses and all orders for this visit:  Panlobular emphysema ([place])  Controlled type 2 diabetes mellitus without complication, without long-term current use of insulin ([place])  Type 2 diabetes mellitus with hyperglycemia, without long-term current use of insulin ([facility]) -     CBC with Differential/Platelet -     CMP14+EGFR -     Bayer DCA Hb A1c Waived -     metFORMIN (GLUCOPHAGE) 500 MG tablet; Take 1 tablet (500 mg total) by mouth 2 (two) times daily with a meal.  Other orders -     pantoprazole (PROTONIX) 40 MG tablet;  Take 1 tablet (40 mg total) by mouth at bedtime. -     potassium chloride (MICRO-K) 10 MEQ CR capsule; TAKE THREE CAPSULES BY MOUTH TWICE DAILY -     venlafaxine (EFFEXOR) 75 MG tablet; Take 1 tablet by mouth in the evening for 7 days then increase to 2 by mouth every evening.       I have discontinued [place]. [place]'s fluticasone, Sirolimus, Rivaroxaban, ranitidine, fluconazole, predniSONE, citalopram, and metFORMIN. I have also changed his metFORMIN and pantoprazole. Additionally, I am having him start on venlafaxine. Lastly, I am having him maintain his lactulose, multivitamin with minerals, Polyethyl Glycol-Propyl Glycol, umeclidinium bromide, glucose blood, SYMBICORT, levalbuterol, folic acid, tiotropium, metoprolol tartrate, clonazePAM, rOPINIRole, montelukast, nefazodone, sulfamethoxazole-trimethoprim, TRUBIOTICS, docusate sodium, albuterol, oxymetazoline, triamcinolone cream, HYDROmorphone, torsemide, and potassium chloride.  Allergies as of 03/28/2017      Reactions   Nsaids Shortness Of Breath   Pt states he can take ibuprofen occasionally with no reaction   Pregabalin Other (See Comments)   Fluid retention      Medication List        Accurate as of 03/28/17  7:02 PM. Always use your most recent med list.          clonazePAM  0.5 MG tablet Commonly known as:  KLONOPIN Take 1 mg by mouth at bedtime.   docusate sodium 100 MG capsule Commonly known as:  COLACE Take 100 mg by mouth daily as needed (constipation).   folic acid 1 MG tablet Commonly known as:  FOLVITE TAKE 1 TABLET BY MOUTH ONCE DAILY   glucose blood test strip Use as instructed   HYDROmorphone 4 MG tablet Commonly known as:  DILAUDID Take 4 mg by mouth 5 (five) times daily.   lactulose 10 GM/15ML solution Commonly known as:  [place] Take by mouth daily as needed (constipation).   levalbuterol 1.25 MG/3ML nebulizer solution Commonly known as:  XOPENEX USE ONE VIAL IN NEBULIZER 4 TIMES DAILY    metFORMIN 500 MG tablet Commonly known as:  GLUCOPHAGE Take 1 tablet (500 mg total) by mouth 2 (two) times daily with a meal.   metoprolol tartrate 25 MG tablet Commonly known as:  LOPRESSOR Take 12.5 mg by mouth 2 (two) times daily.   montelukast 10 MG tablet Commonly known as:  SINGULAIR Take 10 mg by mouth daily.   multivitamin with minerals Tabs tablet Take 1 tablet by mouth daily.   nefazodone 50 MG tablet Commonly known as:  SERZONE Take 50 mg by mouth at bedtime.   oxymetazoline 0.05 % nasal spray Commonly known as:  AFRIN Place 1 spray into both nostrils daily as needed (nose bleeds).   pantoprazole 40 MG tablet Commonly known as:  PROTONIX Take 1 tablet (40 mg total) by mouth at bedtime.   potassium chloride 10 MEQ CR capsule Commonly known as:  MICRO-K TAKE THREE CAPSULES BY MOUTH TWICE DAILY   PROAIR HFA 108 (90 Base) MCG/ACT inhaler Generic drug:  albuterol Inhale 2 puffs into the lungs daily.   rOPINIRole 3 MG tablet Commonly known as:  REQUIP Take 3 mg by mouth at bedtime.   sulfamethoxazole-trimethoprim 800-160 MG tablet Commonly known as:  BACTRIM DS,SEPTRA DS Take 1 tablet by mouth every Monday, Wednesday, and Friday.   SYMBICORT 160-4.5 MCG/ACT inhaler Generic drug:  budesonide-formoterol INHALE TWO PUFFS BY MOUTH ONCE DAILY IN THE MORNING   SYSTANE 0.4-0.3 % Soln Generic drug:  Polyethyl Glycol-Propyl Glycol Place 1 drop into both eyes 5 (five) times daily as needed (for dry eyes).   tiotropium 18 MCG inhalation capsule Commonly known as:  SPIRIVA HANDIHALER Place 1 capsule (18 mcg total) daily into inhaler and inhale.   torsemide 100 MG tablet Commonly known as:  DEMADEX TAKE TWO TABLETS BY MOUTH ONCE DAILY   triamcinolone cream 0.1 % Commonly known as:  KENALOG Apply 1 application topically 2 (two) times daily as needed (rash/itching).   TRUBIOTICS Caps Take 1 capsule by mouth daily.   umeclidinium bromide 62.5 MCG/INH  Aepb Commonly known as:  INCRUSE ELLIPTA Inhale 1 puff into the lungs daily.   venlafaxine 75 MG tablet Commonly known as:  EFFEXOR Take 1 tablet by mouth in the evening for 7 days then increase to 2 by mouth every evening.        Follow-up: Return in about 1 month (around 04/28/2017).  Claretta Fraise, M.D.

## 2017-03-29 LAB — CBC WITH DIFFERENTIAL/PLATELET
BASOS ABS: 0.1 10*3/uL (ref 0.0–0.2)
Basos: 1 %
EOS (ABSOLUTE): 0.2 10*3/uL (ref 0.0–0.4)
Eos: 3 %
HEMOGLOBIN: 9.6 g/dL — AB (ref 13.0–17.7)
Hematocrit: 29.1 % — ABNORMAL LOW (ref 37.5–51.0)
IMMATURE GRANS (ABS): 0 10*3/uL (ref 0.0–0.1)
Immature Granulocytes: 0 %
LYMPHS ABS: 2 10*3/uL (ref 0.7–3.1)
LYMPHS: 23 %
MCH: 33 pg (ref 26.6–33.0)
MCHC: 33 g/dL (ref 31.5–35.7)
MCV: 100 fL — ABNORMAL HIGH (ref 79–97)
MONOCYTES: 9 %
Monocytes Absolute: 0.8 10*3/uL (ref 0.1–0.9)
Neutrophils Absolute: 5.7 10*3/uL (ref 1.4–7.0)
Neutrophils: 64 %
Platelets: 188 10*3/uL (ref 150–379)
RBC: 2.91 x10E6/uL — ABNORMAL LOW (ref 4.14–5.80)
RDW: 19.7 % — ABNORMAL HIGH (ref 12.3–15.4)
WBC: 8.9 10*3/uL (ref 3.4–10.8)

## 2017-03-29 LAB — CMP14+EGFR
A/G RATIO: 1.9 (ref 1.2–2.2)
ALT: 14 IU/L (ref 0–44)
AST: 26 IU/L (ref 0–40)
Albumin: 4.2 g/dL (ref 3.5–5.5)
Alkaline Phosphatase: 89 IU/L (ref 39–117)
BUN/Creatinine Ratio: 13 (ref 9–20)
BUN: 14 mg/dL (ref 6–24)
Bilirubin Total: <0.2 mg/dL (ref 0.0–1.2)
CALCIUM: 9.5 mg/dL (ref 8.7–10.2)
CO2: 39 mmol/L — ABNORMAL HIGH (ref 20–29)
Chloride: 90 mmol/L — ABNORMAL LOW (ref 96–106)
Creatinine, Ser: 1.06 mg/dL (ref 0.76–1.27)
GFR calc Af Amer: 88 mL/min/{1.73_m2} (ref >59)
GFR, EST NON AFRICAN AMERICAN: 76 mL/min/{1.73_m2} (ref >59)
GLOBULIN, TOTAL: 2.2 g/dL (ref 1.5–4.5)
Glucose: 94 mg/dL (ref 65–99)
POTASSIUM: 4.1 mmol/L (ref 3.5–5.2)
SODIUM: 143 mmol/L (ref 134–144)
Total Protein: 6.4 g/dL (ref 6.0–8.5)

## 2017-03-31 ENCOUNTER — Telehealth (HOSPITAL_COMMUNITY): Payer: Self-pay

## 2017-03-31 NOTE — Telephone Encounter (Signed)
[facility] - left message

## 2017-04-05 DIAGNOSIS — Z9841 Cataract extraction status, right eye: Secondary | ICD-10-CM | POA: Diagnosis not present

## 2017-04-05 DIAGNOSIS — Z9842 Cataract extraction status, left eye: Secondary | ICD-10-CM | POA: Diagnosis not present

## 2017-04-05 DIAGNOSIS — R0902 Hypoxemia: Secondary | ICD-10-CM | POA: Diagnosis not present

## 2017-04-05 DIAGNOSIS — D89811 Chronic graft-versus-host disease: Secondary | ICD-10-CM | POA: Diagnosis not present

## 2017-04-05 DIAGNOSIS — L989 Disorder of the skin and subcutaneous tissue, unspecified: Secondary | ICD-10-CM | POA: Diagnosis not present

## 2017-04-05 DIAGNOSIS — H16121 Filamentary keratitis, right eye: Secondary | ICD-10-CM | POA: Diagnosis not present

## 2017-04-05 DIAGNOSIS — Z961 Presence of intraocular lens: Secondary | ICD-10-CM | POA: Diagnosis not present

## 2017-04-05 DIAGNOSIS — D801 Nonfamilial hypogammaglobulinemia: Secondary | ICD-10-CM | POA: Diagnosis not present

## 2017-04-05 DIAGNOSIS — D89813 Graft-versus-host disease, unspecified: Secondary | ICD-10-CM | POA: Diagnosis not present

## 2017-04-05 DIAGNOSIS — Z9484 Stem cells transplant status: Secondary | ICD-10-CM | POA: Diagnosis not present

## 2017-04-05 DIAGNOSIS — H04123 Dry eye syndrome of bilateral lacrimal glands: Secondary | ICD-10-CM | POA: Diagnosis not present

## 2017-04-05 DIAGNOSIS — Z598 Other problems related to housing and economic circumstances: Secondary | ICD-10-CM | POA: Diagnosis not present

## 2017-04-05 DIAGNOSIS — C9101 Acute lymphoblastic leukemia, in remission: Secondary | ICD-10-CM | POA: Diagnosis not present

## 2017-04-14 DIAGNOSIS — J8489 Other specified interstitial pulmonary diseases: Secondary | ICD-10-CM | POA: Diagnosis not present

## 2017-04-14 DIAGNOSIS — E872 Acidosis: Secondary | ICD-10-CM | POA: Diagnosis not present

## 2017-04-14 DIAGNOSIS — Z66 Do not resuscitate: Secondary | ICD-10-CM | POA: Diagnosis not present

## 2017-04-14 DIAGNOSIS — Z9484 Stem cells transplant status: Secondary | ICD-10-CM | POA: Diagnosis not present

## 2017-04-14 DIAGNOSIS — Z87891 Personal history of nicotine dependence: Secondary | ICD-10-CM | POA: Diagnosis not present

## 2017-04-14 DIAGNOSIS — Z856 Personal history of leukemia: Secondary | ICD-10-CM | POA: Diagnosis not present

## 2017-04-14 DIAGNOSIS — R0602 Shortness of breath: Secondary | ICD-10-CM | POA: Diagnosis not present

## 2017-04-14 DIAGNOSIS — J9622 Acute and chronic respiratory failure with hypercapnia: Secondary | ICD-10-CM | POA: Diagnosis not present

## 2017-04-14 DIAGNOSIS — J449 Chronic obstructive pulmonary disease, unspecified: Secondary | ICD-10-CM | POA: Diagnosis not present

## 2017-04-14 DIAGNOSIS — Z452 Encounter for adjustment and management of vascular access device: Secondary | ICD-10-CM | POA: Diagnosis not present

## 2017-04-14 DIAGNOSIS — Z9981 Dependence on supplemental oxygen: Secondary | ICD-10-CM | POA: Diagnosis not present

## 2017-04-14 DIAGNOSIS — J9621 Acute and chronic respiratory failure with hypoxia: Secondary | ICD-10-CM | POA: Diagnosis not present

## 2017-04-14 DIAGNOSIS — K219 Gastro-esophageal reflux disease without esophagitis: Secondary | ICD-10-CM | POA: Diagnosis not present

## 2017-04-14 DIAGNOSIS — I5030 Unspecified diastolic (congestive) heart failure: Secondary | ICD-10-CM | POA: Diagnosis not present

## 2017-04-14 DIAGNOSIS — R0989 Other specified symptoms and signs involving the circulatory and respiratory systems: Secondary | ICD-10-CM | POA: Diagnosis not present

## 2017-04-15 DIAGNOSIS — I5030 Unspecified diastolic (congestive) heart failure: Secondary | ICD-10-CM | POA: Diagnosis not present

## 2017-04-15 DIAGNOSIS — Z87891 Personal history of nicotine dependence: Secondary | ICD-10-CM | POA: Diagnosis not present

## 2017-04-15 DIAGNOSIS — J449 Chronic obstructive pulmonary disease, unspecified: Secondary | ICD-10-CM | POA: Diagnosis not present

## 2017-04-15 DIAGNOSIS — Z856 Personal history of leukemia: Secondary | ICD-10-CM | POA: Diagnosis not present

## 2017-04-15 DIAGNOSIS — Z66 Do not resuscitate: Secondary | ICD-10-CM | POA: Diagnosis not present

## 2017-04-15 DIAGNOSIS — J8489 Other specified interstitial pulmonary diseases: Secondary | ICD-10-CM | POA: Diagnosis not present

## 2017-04-15 DIAGNOSIS — Z9981 Dependence on supplemental oxygen: Secondary | ICD-10-CM | POA: Diagnosis not present

## 2017-04-15 DIAGNOSIS — Z9484 Stem cells transplant status: Secondary | ICD-10-CM | POA: Diagnosis not present

## 2017-04-15 DIAGNOSIS — J9621 Acute and chronic respiratory failure with hypoxia: Secondary | ICD-10-CM | POA: Diagnosis not present

## 2017-04-15 DIAGNOSIS — E872 Acidosis: Secondary | ICD-10-CM | POA: Diagnosis not present

## 2017-04-15 DIAGNOSIS — J9622 Acute and chronic respiratory failure with hypercapnia: Secondary | ICD-10-CM | POA: Diagnosis not present

## 2017-04-15 DIAGNOSIS — K219 Gastro-esophageal reflux disease without esophagitis: Secondary | ICD-10-CM | POA: Diagnosis not present

## 2017-04-16 DIAGNOSIS — J8489 Other specified interstitial pulmonary diseases: Secondary | ICD-10-CM | POA: Diagnosis not present

## 2017-04-16 DIAGNOSIS — J9621 Acute and chronic respiratory failure with hypoxia: Secondary | ICD-10-CM | POA: Diagnosis not present

## 2017-04-16 DIAGNOSIS — J9622 Acute and chronic respiratory failure with hypercapnia: Secondary | ICD-10-CM | POA: Diagnosis not present

## 2017-04-16 MED ORDER — ACETAMINOPHEN 500 MG PO TABS
1000.00 | ORAL_TABLET | ORAL | Status: DC
Start: ? — End: 2017-04-16

## 2017-04-16 MED ORDER — SENNOSIDES 8.6 MG PO TABS
17.20 | ORAL_TABLET | ORAL | Status: DC
Start: ? — End: 2017-04-16

## 2017-04-16 MED ORDER — TRIAMCINOLONE ACETONIDE 0.1 % EX OINT
TOPICAL_OINTMENT | CUTANEOUS | Status: DC
Start: 2017-04-16 — End: 2017-04-16

## 2017-04-16 MED ORDER — PANTOPRAZOLE SODIUM 40 MG PO TBEC
40.00 | DELAYED_RELEASE_TABLET | ORAL | Status: DC
Start: 2017-04-16 — End: 2017-04-16

## 2017-04-16 MED ORDER — CLONAZEPAM 0.5 MG PO TABS
0.25 | ORAL_TABLET | ORAL | Status: DC
Start: 2017-04-16 — End: 2017-04-16

## 2017-04-16 MED ORDER — GENERIC EXTERNAL MEDICATION
2.00 | Status: DC
Start: 2017-04-16 — End: 2017-04-16

## 2017-04-16 MED ORDER — GENERIC EXTERNAL MEDICATION
3.00 | Status: DC
Start: 2017-04-16 — End: 2017-04-16

## 2017-04-16 MED ORDER — MELATONIN 3 MG PO TABS
3.00 | ORAL_TABLET | ORAL | Status: DC
Start: 2017-04-16 — End: 2017-04-16

## 2017-04-16 MED ORDER — METOPROLOL TARTRATE 25 MG PO TABS
12.50 | ORAL_TABLET | ORAL | Status: DC
Start: 2017-04-16 — End: 2017-04-16

## 2017-04-16 MED ORDER — ALBUTEROL SULFATE (2.5 MG/3ML) 0.083% IN NEBU
2.50 | INHALATION_SOLUTION | RESPIRATORY_TRACT | Status: DC
Start: 2017-04-16 — End: 2017-04-16

## 2017-04-16 MED ORDER — TIOTROPIUM BROMIDE MONOHYDRATE 18 MCG IN CAPS
1.00 | ORAL_CAPSULE | RESPIRATORY_TRACT | Status: DC
Start: 2017-04-17 — End: 2017-04-16

## 2017-04-16 MED ORDER — GENERIC EXTERNAL MEDICATION
2.00 | Status: DC
Start: ? — End: 2017-04-16

## 2017-04-16 MED ORDER — ALBUTEROL SULFATE (2.5 MG/3ML) 0.083% IN NEBU
2.50 | INHALATION_SOLUTION | RESPIRATORY_TRACT | Status: DC
Start: ? — End: 2017-04-16

## 2017-04-16 MED ORDER — ENOXAPARIN SODIUM 40 MG/0.4ML ~~LOC~~ SOLN
40.00 | SUBCUTANEOUS | Status: DC
Start: 2017-04-17 — End: 2017-04-16

## 2017-04-16 MED ORDER — TORSEMIDE 10 MG PO TABS
50.00 | ORAL_TABLET | ORAL | Status: DC
Start: 2017-04-16 — End: 2017-04-16

## 2017-04-16 MED ORDER — MUPIROCIN 2 % EX OINT
TOPICAL_OINTMENT | CUTANEOUS | Status: DC
Start: 2017-04-16 — End: 2017-04-16

## 2017-04-18 DIAGNOSIS — R69 Illness, unspecified: Secondary | ICD-10-CM | POA: Diagnosis not present

## 2017-04-19 DIAGNOSIS — H16121 Filamentary keratitis, right eye: Secondary | ICD-10-CM | POA: Diagnosis not present

## 2017-04-19 DIAGNOSIS — D89813 Graft-versus-host disease, unspecified: Secondary | ICD-10-CM | POA: Diagnosis not present

## 2017-04-19 DIAGNOSIS — Z9841 Cataract extraction status, right eye: Secondary | ICD-10-CM | POA: Diagnosis not present

## 2017-04-19 DIAGNOSIS — Z961 Presence of intraocular lens: Secondary | ICD-10-CM | POA: Diagnosis not present

## 2017-04-19 DIAGNOSIS — Z9842 Cataract extraction status, left eye: Secondary | ICD-10-CM | POA: Diagnosis not present

## 2017-04-19 DIAGNOSIS — H04123 Dry eye syndrome of bilateral lacrimal glands: Secondary | ICD-10-CM | POA: Diagnosis not present

## 2017-04-21 DIAGNOSIS — T82594A Other mechanical complication of infusion catheter, initial encounter: Secondary | ICD-10-CM | POA: Diagnosis not present

## 2017-04-21 DIAGNOSIS — D89811 Chronic graft-versus-host disease: Secondary | ICD-10-CM | POA: Diagnosis not present

## 2017-04-27 ENCOUNTER — Other Ambulatory Visit: Payer: Self-pay | Admitting: Family Medicine

## 2017-05-01 ENCOUNTER — Ambulatory Visit: Payer: Medicare HMO | Admitting: Family Medicine

## 2017-05-02 ENCOUNTER — Ambulatory Visit (INDEPENDENT_AMBULATORY_CARE_PROVIDER_SITE_OTHER): Payer: Medicare HMO | Admitting: Family Medicine

## 2017-05-02 ENCOUNTER — Encounter: Payer: Self-pay | Admitting: Family Medicine

## 2017-05-02 VITALS — BP 125/69 | HR 90 | Temp 98.3°F | Ht 66.0 in | Wt 205.2 lb

## 2017-05-02 DIAGNOSIS — J479 Bronchiectasis, uncomplicated: Secondary | ICD-10-CM | POA: Diagnosis not present

## 2017-05-02 DIAGNOSIS — R6889 Other general symptoms and signs: Secondary | ICD-10-CM | POA: Diagnosis not present

## 2017-05-02 DIAGNOSIS — J431 Panlobular emphysema: Secondary | ICD-10-CM | POA: Diagnosis not present

## 2017-05-02 DIAGNOSIS — W57XXXA Bitten or stung by nonvenomous insect and other nonvenomous arthropods, initial encounter: Secondary | ICD-10-CM

## 2017-05-02 DIAGNOSIS — F329 Major depressive disorder, single episode, unspecified: Secondary | ICD-10-CM | POA: Diagnosis not present

## 2017-05-02 DIAGNOSIS — J449 Chronic obstructive pulmonary disease, unspecified: Secondary | ICD-10-CM | POA: Diagnosis not present

## 2017-05-02 DIAGNOSIS — J9612 Chronic respiratory failure with hypercapnia: Secondary | ICD-10-CM | POA: Diagnosis not present

## 2017-05-02 DIAGNOSIS — E119 Type 2 diabetes mellitus without complications: Secondary | ICD-10-CM

## 2017-05-02 DIAGNOSIS — R69 Illness, unspecified: Secondary | ICD-10-CM | POA: Diagnosis not present

## 2017-05-02 DIAGNOSIS — F32A Depression, unspecified: Secondary | ICD-10-CM

## 2017-05-02 LAB — CBC WITH DIFFERENTIAL/PLATELET
BASOS ABS: 0 10*3/uL (ref 0.0–0.2)
Basos: 0 %
EOS (ABSOLUTE): 0.1 10*3/uL (ref 0.0–0.4)
Eos: 1 %
HEMOGLOBIN: 10.8 g/dL — AB (ref 13.0–17.7)
Hematocrit: 33.7 % — ABNORMAL LOW (ref 37.5–51.0)
IMMATURE GRANULOCYTES: 1 %
Immature Grans (Abs): 0.1 10*3/uL (ref 0.0–0.1)
LYMPHS ABS: 3.1 10*3/uL (ref 0.7–3.1)
Lymphs: 25 %
MCH: 33.3 pg — ABNORMAL HIGH (ref 26.6–33.0)
MCHC: 32 g/dL (ref 31.5–35.7)
MCV: 104 fL — ABNORMAL HIGH (ref 79–97)
Monocytes Absolute: 1.2 10*3/uL — ABNORMAL HIGH (ref 0.1–0.9)
Monocytes: 10 %
NEUTROS PCT: 63 %
Neutrophils Absolute: 7.9 10*3/uL — ABNORMAL HIGH (ref 1.4–7.0)
Platelets: 280 10*3/uL (ref 150–379)
RBC: 3.24 x10E6/uL — AB (ref 4.14–5.80)
RDW: 17.7 % — AB (ref 12.3–15.4)
WBC: 12.4 10*3/uL — AB (ref 3.4–10.8)

## 2017-05-02 LAB — CMP14+EGFR
ALBUMIN: 4.6 g/dL (ref 3.5–5.5)
ALT: 19 IU/L (ref 0–44)
AST: 16 IU/L (ref 0–40)
Albumin/Globulin Ratio: 2 (ref 1.2–2.2)
Alkaline Phosphatase: 81 IU/L (ref 39–117)
BUN/Creatinine Ratio: 25 — ABNORMAL HIGH (ref 9–20)
BUN: 27 mg/dL — AB (ref 6–24)
Bilirubin Total: 0.2 mg/dL (ref 0.0–1.2)
CALCIUM: 10.1 mg/dL (ref 8.7–10.2)
CO2: 37 mmol/L — AB (ref 20–29)
CREATININE: 1.1 mg/dL (ref 0.76–1.27)
Chloride: 90 mmol/L — ABNORMAL LOW (ref 96–106)
GFR calc Af Amer: 84 mL/min/{1.73_m2} (ref >59)
GFR calc non Af Amer: 73 mL/min/{1.73_m2} (ref >59)
GLUCOSE: 89 mg/dL (ref 65–99)
Globulin, Total: 2.3 g/dL (ref 1.5–4.5)
Potassium: 4.1 mmol/L (ref 3.5–5.2)
Sodium: 143 mmol/L (ref 134–144)
Total Protein: 6.9 g/dL (ref 6.0–8.5)

## 2017-05-02 MED ORDER — GLUCOSE BLOOD VI STRP
ORAL_STRIP | 12 refills | Status: DC
Start: 2017-05-02 — End: 2017-06-25

## 2017-05-02 NOTE — Progress Notes (Signed)
Subjective:  Patient ID: Derek Shepherd, male    DOB: 06/25/1955  Age: 62 y.o. MRN: [id number]  CC: Follow-up (pt here today for 1 month follow up after starting Effexor at bedtime which he says is helping)  Recheck of depression treatment with follow-up for recheck of depression treatment started last month.  He was started on venlafaxine.  He already takes a rather complex regimen of medication.  Today he tells me that he has been placed on 40 mg a day of prednisone by pulmonology.  This seems to have calmed down some of his breathing disorders.  This is helped as well with depression and that he is not quite so chronically short of breath.  As a result his PHQ 9 score has come back down into normal range from a moderately severe range just 6 weeks ago.  See below.  Depression screen [facility] 2/9 05/02/2017 03/28/2017 01/23/2017  Decreased Interest _0 Down, Depressed, Hopeless 0 2 1  PHQ - 2 Score _1 Altered sleeping - 3 1  Tired, decreased energy - 3 3  Change in appetite - 2 2  Feeling bad or failure about yourself  - 1 1  Trouble concentrating - 3 3  Moving slowly or fidgety/restless - 3 2  Suicidal thoughts - 0 0  PHQ-9 Score - 18 14  Difficult doing work/chores - - -  Some recent data might be hidden    History Derek Shepherd has a past medical history of Adenomatous colon polyp, Anemia, Anxiety, Arthritis, Asthma, Bowel obstruction ([place]), CHF (congestive heart failure) ([place]), Complication of anesthesia, COPD (chronic obstructive pulmonary disease) ([place]), Depression, Diabetes mellitus without complication ([place]), Diverticulosis, DVT (deep venous thrombosis) ([place]), Gallstones, GERD (gastroesophageal reflux disease), 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   He  has a past surgical history that includes Cholecystectomy; Lung surgery (Left); Exploratory laparotomy; Hernia repair; Colonoscopy w/ biopsies; and Bone marrow transplant (2011).   His family history includes Clotting disorder in his mother; Diabetes in his brother, father, maternal aunt, and sister; Heart attack in his father and mother; Prostate cancer in his brother.He reports that he quit smoking about 23 years ago. His smoking use included cigarettes. he has never used smokeless tobacco. He reports that he drinks alcohol. He reports that he does not use drugs.    ROS Review of Systems  Constitutional: Negative for chills, diaphoresis and fever.  HENT: Negative for rhinorrhea and sore throat.   Respiratory: Positive for shortness of breath (Although this persists it has improved significantly). Negative for cough.   Cardiovascular: Negative for chest pain.  Gastrointestinal: Negative for abdominal pain.  Musculoskeletal: Negative for arthralgias and myalgias.  Skin: Negative for rash.       Patient notes that he was bit by tick recently.  It seems to be healing well at the site he has had no rash since then other than local.    Neurological: Negative for weakness and headaches.    Objective:  BP 125/69   Pulse 90   Temp 98.3 F (36.8 C) (Oral)   Ht _2  (1.676 m)   Wt 205 lb 4 oz (93.1 kg)   SpO2 94% Comment: on 2 liters oxygen via nasal canula  BMI 33.13 kg/m   BP Readings from Last 3 Encounters:  05/02/17 125/69  03/28/17  111/69  02/04/17 136/80    Wt Readings from Last 3 Encounters:  05/02/17 205 lb 4 oz (93.1 kg)  03/28/17 208 lb 4 oz (94.5 kg)  02/04/17 214 lb 11.7 oz (97.4 kg)     Physical Exam  Constitutional: He appears well-developed and well-nourished.  Chronically debilitated patient wearing oxygen cannula  HENT:  Head: Normocephalic and atraumatic.  Right Ear: Tympanic membrane and external ear normal. No decreased hearing is noted.  Left Ear:  Tympanic membrane and external ear normal. No decreased hearing is noted.  Mouth/Throat: No oropharyngeal exudate or posterior oropharyngeal erythema.  Eyes: Pupils are equal, round, and reactive to light.  Neck: Normal range of motion. Neck supple.  Cardiovascular: Normal rate and regular rhythm.  No murmur heard. Pulmonary/Chest: Breath sounds normal. No respiratory distress.  Abdominal: Soft. Bowel sounds are normal. He exhibits no mass. There is no tenderness.  Skin:  Minimal hyperemia surrounding the nidus of the tick bite at the left lateral hip  Vitals reviewed.     Assessment & Plan:   Derek Shepherd was seen today for follow-up.  Diagnoses and all orders for this visit:  Chronic hypercapnic respiratory failure ([place]) -     CBC with Differential/Platelet -     CMP14+EGFR -     B12 and Folate Panel  Tick bite, initial encounter -     CBC with Differential/Platelet  Panlobular emphysema (HCC) -     CBC with Differential/Platelet -     CMP14+EGFR -     B12 and Folate Panel -     Specimen status report  Controlled type 2 diabetes mellitus without complication, without long-term current use of insulin (HCC) -     CBC with Differential/Platelet -     CMP14+EGFR -     glucose blood test strip; Use as instructed -     B12 and Folate Panel -     Specimen status report  Bronchiectasis without complication (HCC) -     CBC with Differential/Platelet -     CMP14+EGFR -     B12 and Folate Panel  Depression, unspecified depression type -     CBC with Differential/Platelet -     CMP14+EGFR -     B12 and Folate Panel       I am having Derek Shepherd. Derek Shepherd maintain his lactulose, multivitamin with minerals, Polyethyl Glycol-Propyl Glycol, umeclidinium bromide, SYMBICORT, levalbuterol, folic acid, tiotropium, metoprolol tartrate, clonazePAM, montelukast, nefazodone, sulfamethoxazole-trimethoprim, TRUBIOTICS, docusate sodium, albuterol, oxymetazoline, triamcinolone cream,  HYDROmorphone, torsemide, metFORMIN, pantoprazole, potassium chloride, venlafaxine, rOPINIRole, and glucose blood.  Allergies as of 05/02/2017      Reactions   Nsaids Shortness Of Breath   Pt states he can take ibuprofen occasionally with no reaction   Pregabalin Other (See Comments)   Fluid retention      Medication List        Accurate as of 05/02/17 11:59 PM. Always use your most recent med list.          clonazePAM 0.5 MG tablet Commonly known as:  KLONOPIN Take 1 mg by mouth at bedtime.   docusate sodium 100 MG capsule Commonly known as:  COLACE Take 100 mg by mouth daily as needed (constipation).   folic acid 1 MG tablet Commonly known as:  FOLVITE TAKE 1 TABLET BY MOUTH ONCE DAILY   glucose blood test strip Use as instructed   HYDROmorphone 4 MG tablet Commonly known as:  DILAUDID Take 4 mg by mouth 5 (five) times daily.  lactulose 10 GM/15ML solution Commonly known as:  [place] Take by mouth daily as needed (constipation).   levalbuterol 1.25 MG/3ML nebulizer solution Commonly known as:  XOPENEX USE ONE VIAL IN NEBULIZER 4 TIMES DAILY   metFORMIN 500 MG tablet Commonly known as:  GLUCOPHAGE Take 1 tablet (500 mg total) by mouth 2 (two) times daily with a meal.   metoprolol tartrate 25 MG tablet Commonly known as:  LOPRESSOR Take 12.5 mg by mouth 2 (two) times daily.   montelukast 10 MG tablet Commonly known as:  SINGULAIR Take 10 mg by mouth daily.   multivitamin with minerals Tabs tablet Take 1 tablet by mouth daily.   nefazodone 50 MG tablet Commonly known as:  SERZONE Take 50 mg by mouth at bedtime.   oxymetazoline 0.05 % nasal spray Commonly known as:  AFRIN Place 1 spray into both nostrils daily as needed (nose bleeds).   pantoprazole 40 MG tablet Commonly known as:  PROTONIX Take 1 tablet (40 mg total) by mouth at bedtime.   potassium chloride 10 MEQ CR capsule Commonly known as:  MICRO-K TAKE THREE CAPSULES BY MOUTH TWICE  DAILY   PROAIR HFA 108 (90 Base) MCG/ACT inhaler Generic drug:  albuterol Inhale 2 puffs into the lungs daily.   rOPINIRole 3 MG tablet Commonly known as:  REQUIP TAKE 1 TABLET BY MOUTH ONCE DAILY AT BEDTIME   sulfamethoxazole-trimethoprim 800-160 MG tablet Commonly known as:  BACTRIM DS,SEPTRA DS Take 1 tablet by mouth every Monday, Wednesday, and Friday.   SYMBICORT 160-4.5 MCG/ACT inhaler Generic drug:  budesonide-formoterol INHALE TWO PUFFS BY MOUTH ONCE DAILY IN THE MORNING   SYSTANE 0.4-0.3 % Soln Generic drug:  Polyethyl Glycol-Propyl Glycol Place 1 drop into both eyes 5 (five) times daily as needed (for dry eyes).   tiotropium 18 MCG inhalation capsule Commonly known as:  SPIRIVA HANDIHALER Place 1 capsule (18 mcg total) daily into inhaler and inhale.   torsemide 100 MG tablet Commonly known as:  DEMADEX TAKE TWO TABLETS BY MOUTH ONCE DAILY   triamcinolone cream 0.1 % Commonly known as:  KENALOG Apply 1 application topically 2 (two) times daily as needed (rash/itching).   TRUBIOTICS Caps Take 1 capsule by mouth daily.   umeclidinium bromide 62.5 MCG/INH Aepb Commonly known as:  INCRUSE ELLIPTA Inhale 1 puff into the lungs daily.   venlafaxine 75 MG tablet Commonly known as:  EFFEXOR Take 1 tablet by mouth in the evening for 7 days then increase to 2 by mouth every evening.        Follow-up: Return in about 2 months (around 06/30/2017).  Claretta Fraise, M.D.

## 2017-05-04 DIAGNOSIS — R0902 Hypoxemia: Secondary | ICD-10-CM | POA: Diagnosis not present

## 2017-05-05 LAB — SPECIMEN STATUS REPORT

## 2017-05-05 LAB — B12 AND FOLATE PANEL: Vitamin B-12: 657 pg/mL (ref 232–1245)

## 2017-05-08 DIAGNOSIS — J9611 Chronic respiratory failure with hypoxia: Secondary | ICD-10-CM | POA: Diagnosis not present

## 2017-05-08 DIAGNOSIS — I5030 Unspecified diastolic (congestive) heart failure: Secondary | ICD-10-CM | POA: Diagnosis not present

## 2017-05-08 DIAGNOSIS — Z86711 Personal history of pulmonary embolism: Secondary | ICD-10-CM | POA: Diagnosis not present

## 2017-05-08 DIAGNOSIS — J449 Chronic obstructive pulmonary disease, unspecified: Secondary | ICD-10-CM | POA: Diagnosis not present

## 2017-05-08 DIAGNOSIS — G8929 Other chronic pain: Secondary | ICD-10-CM | POA: Diagnosis not present

## 2017-05-08 DIAGNOSIS — Z86718 Personal history of other venous thrombosis and embolism: Secondary | ICD-10-CM | POA: Diagnosis not present

## 2017-05-08 DIAGNOSIS — Z888 Allergy status to other drugs, medicaments and biological substances status: Secondary | ICD-10-CM | POA: Diagnosis not present

## 2017-05-08 DIAGNOSIS — Z886 Allergy status to analgesic agent status: Secondary | ICD-10-CM | POA: Diagnosis not present

## 2017-05-08 DIAGNOSIS — Z598 Other problems related to housing and economic circumstances: Secondary | ICD-10-CM | POA: Diagnosis not present

## 2017-05-08 DIAGNOSIS — D89811 Chronic graft-versus-host disease: Secondary | ICD-10-CM | POA: Diagnosis not present

## 2017-05-08 DIAGNOSIS — R69 Illness, unspecified: Secondary | ICD-10-CM | POA: Diagnosis not present

## 2017-05-08 DIAGNOSIS — Z9484 Stem cells transplant status: Secondary | ICD-10-CM | POA: Diagnosis not present

## 2017-05-08 DIAGNOSIS — C9101 Acute lymphoblastic leukemia, in remission: Secondary | ICD-10-CM | POA: Diagnosis not present

## 2017-05-10 DIAGNOSIS — D89813 Graft-versus-host disease, unspecified: Secondary | ICD-10-CM | POA: Diagnosis not present

## 2017-05-10 DIAGNOSIS — Z961 Presence of intraocular lens: Secondary | ICD-10-CM | POA: Diagnosis not present

## 2017-05-10 DIAGNOSIS — Z9842 Cataract extraction status, left eye: Secondary | ICD-10-CM | POA: Diagnosis not present

## 2017-05-10 DIAGNOSIS — H16121 Filamentary keratitis, right eye: Secondary | ICD-10-CM | POA: Diagnosis not present

## 2017-05-10 DIAGNOSIS — Z9841 Cataract extraction status, right eye: Secondary | ICD-10-CM | POA: Diagnosis not present

## 2017-05-10 DIAGNOSIS — H04123 Dry eye syndrome of bilateral lacrimal glands: Secondary | ICD-10-CM | POA: Diagnosis not present

## 2017-05-11 ENCOUNTER — Encounter: Payer: Self-pay | Admitting: Family Medicine

## 2017-05-24 ENCOUNTER — Ambulatory Visit: Payer: Medicare HMO | Admitting: *Deleted

## 2017-05-25 ENCOUNTER — Encounter: Payer: Self-pay | Admitting: Family Medicine

## 2017-05-25 DIAGNOSIS — C9101 Acute lymphoblastic leukemia, in remission: Secondary | ICD-10-CM | POA: Diagnosis not present

## 2017-05-25 DIAGNOSIS — I82409 Acute embolism and thrombosis of unspecified deep veins of unspecified lower extremity: Secondary | ICD-10-CM | POA: Diagnosis not present

## 2017-05-25 DIAGNOSIS — Z6831 Body mass index (BMI) 31.0-31.9, adult: Secondary | ICD-10-CM | POA: Diagnosis not present

## 2017-05-25 DIAGNOSIS — R0689 Other abnormalities of breathing: Secondary | ICD-10-CM | POA: Diagnosis not present

## 2017-05-25 DIAGNOSIS — D89811 Chronic graft-versus-host disease: Secondary | ICD-10-CM | POA: Diagnosis not present

## 2017-05-25 DIAGNOSIS — I2782 Chronic pulmonary embolism: Secondary | ICD-10-CM | POA: Diagnosis not present

## 2017-05-25 DIAGNOSIS — Z7901 Long term (current) use of anticoagulants: Secondary | ICD-10-CM | POA: Diagnosis not present

## 2017-05-25 DIAGNOSIS — C9100 Acute lymphoblastic leukemia not having achieved remission: Secondary | ICD-10-CM | POA: Diagnosis not present

## 2017-05-25 DIAGNOSIS — E119 Type 2 diabetes mellitus without complications: Secondary | ICD-10-CM | POA: Diagnosis not present

## 2017-05-25 DIAGNOSIS — D801 Nonfamilial hypogammaglobulinemia: Secondary | ICD-10-CM | POA: Diagnosis not present

## 2017-05-25 DIAGNOSIS — E669 Obesity, unspecified: Secondary | ICD-10-CM | POA: Diagnosis not present

## 2017-05-25 DIAGNOSIS — J42 Unspecified chronic bronchitis: Secondary | ICD-10-CM | POA: Diagnosis not present

## 2017-05-25 DIAGNOSIS — I2609 Other pulmonary embolism with acute cor pulmonale: Secondary | ICD-10-CM | POA: Diagnosis not present

## 2017-05-25 DIAGNOSIS — I5030 Unspecified diastolic (congestive) heart failure: Secondary | ICD-10-CM | POA: Diagnosis not present

## 2017-05-30 ENCOUNTER — Ambulatory Visit: Payer: Medicare HMO | Admitting: Family Medicine

## 2017-05-31 ENCOUNTER — Telehealth: Payer: Self-pay | Admitting: Family Medicine

## 2017-06-01 DIAGNOSIS — M25562 Pain in left knee: Secondary | ICD-10-CM | POA: Diagnosis not present

## 2017-06-01 DIAGNOSIS — M79605 Pain in left leg: Secondary | ICD-10-CM | POA: Diagnosis not present

## 2017-06-01 DIAGNOSIS — G894 Chronic pain syndrome: Secondary | ICD-10-CM | POA: Diagnosis not present

## 2017-06-01 DIAGNOSIS — G8929 Other chronic pain: Secondary | ICD-10-CM | POA: Diagnosis not present

## 2017-06-02 ENCOUNTER — Ambulatory Visit (INDEPENDENT_AMBULATORY_CARE_PROVIDER_SITE_OTHER): Payer: Medicare HMO | Admitting: Family Medicine

## 2017-06-02 ENCOUNTER — Encounter: Payer: Self-pay | Admitting: Family Medicine

## 2017-06-02 VITALS — BP 139/77 | HR 72 | Ht 66.0 in | Wt 205.0 lb

## 2017-06-02 DIAGNOSIS — J449 Chronic obstructive pulmonary disease, unspecified: Secondary | ICD-10-CM

## 2017-06-02 DIAGNOSIS — F32A Depression, unspecified: Secondary | ICD-10-CM

## 2017-06-02 DIAGNOSIS — E1165 Type 2 diabetes mellitus with hyperglycemia: Secondary | ICD-10-CM

## 2017-06-02 DIAGNOSIS — J479 Bronchiectasis, uncomplicated: Secondary | ICD-10-CM

## 2017-06-02 DIAGNOSIS — R69 Illness, unspecified: Secondary | ICD-10-CM | POA: Diagnosis not present

## 2017-06-02 DIAGNOSIS — F329 Major depressive disorder, single episode, unspecified: Secondary | ICD-10-CM

## 2017-06-02 NOTE — Progress Notes (Signed)
Subjective:  Patient ID: Derek Shepherd, male    DOB: [birth year]  Age: 62 y.o. MRN: [id number]  CC: Follow-up (pt here today for 1 month follow up and just saw his pulmonologist )   HPI Derek Shepherd presents for recheck of depression treatment as well as diabetes care with regard to use of prednisone for COPD. He states his mood has been good. The medication agrees with him.His breathing is the best it has been in years and his glucose has not been going up.  Depression screen [facility] 2/9 06/02/2017 05/02/2017 03/28/2017  Decreased Interest 1 1 1   Down, Depressed, Hopeless 0 0 2  PHQ - 2 Score 1 1 3   Altered sleeping - - 3  Tired, decreased energy - - 3  Change in appetite - - 2  Feeling bad or failure about yourself  - - 1  Trouble concentrating - - 3  Moving slowly or fidgety/restless - - 3  Suicidal thoughts - - 0  PHQ-9 Score - - 18  Difficult doing work/chores - - -  Some recent data might be hidden    History [facility] has a past medical history of Adenomatous colon polyp, Anemia, Anxiety, Arthritis, Asthma, Bowel obstruction (HCC), CHF (congestive heart failure) ([place]), Complication of anesthesia, COPD (chronic obstructive pulmonary disease) ([place]), Depression, Diabetes mellitus without complication ([place]), Diverticulosis, DVT (deep venous thrombosis) ([place]), Gallstones, GERD (gastroesophageal reflux disease), 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   He has a past surgical history that includes Cholecystectomy; Lung surgery (Left); Exploratory laparotomy; Hernia repair; Colonoscopy w/ biopsies; and Bone marrow transplant (2011).   His family history includes Clotting disorder in his mother; Diabetes in his brother, father, maternal aunt, and sister; Heart  attack in his father and mother; Prostate cancer in his brother.He reports that he quit smoking about 23 years ago. His smoking use included cigarettes. He has never used smokeless tobacco. He reports that he drinks alcohol. He reports that he does not use drugs.    ROS Review of Systems  Constitutional: Negative for chills, diaphoresis, fever and unexpected weight change.  HENT: Negative for congestion, hearing loss, rhinorrhea and sore throat.   Eyes: Negative for visual disturbance.  Respiratory: Negative for cough and shortness of breath.   Cardiovascular: Negative for chest pain.  Gastrointestinal: Negative for abdominal pain, constipation and diarrhea.  Genitourinary: Negative for dysuria and flank pain.  Musculoskeletal: Negative for arthralgias and joint swelling.  Skin: Negative for rash.  Neurological: Negative for dizziness and headaches.  Psychiatric/Behavioral: Negative for dysphoric mood and sleep disturbance.    Objective:  BP 139/77   Pulse 72   Ht 5\' 6"  (1.676 m)   Wt 205 lb (93 kg)   SpO2 100%   BMI 33.09 kg/m   BP Readings from Last 3 Encounters:  06/02/17 139/77  05/02/17 125/69  03/28/17 111/69    Wt Readings from Last 3 Encounters:  06/02/17 205 lb (93 kg)  05/02/17 205 lb 4 oz (93.1 kg)  03/28/17 208 lb 4 oz (94.5 kg)     Physical Exam  Constitutional: He is oriented to person, place, and time. He appears well-developed and well-nourished. No distress.  HENT:  Head: Normocephalic and atraumatic.  Right Ear: External ear normal.  Left Ear: External ear normal.  Nose: Nose normal.  Mouth/Throat: Oropharynx is clear and  moist.  Eyes: Pupils are equal, round, and reactive to light. Conjunctivae and EOM are normal.  Neck: Normal range of motion. Neck supple. No thyromegaly present.  Cardiovascular: Normal rate, regular rhythm and normal heart sounds.  No murmur heard. Pulmonary/Chest: [place] normal and breath sounds normal. No respiratory  distress. He has no wheezes. He has no rales.  Abdominal: Soft. Bowel sounds are normal. He exhibits no distension. There is no tenderness.  Lymphadenopathy:    He has no cervical adenopathy.  Neurological: He is alert and oriented to person, place, and time. He has normal reflexes.  Skin: Skin is warm and dry.  Psychiatric: He has a normal mood and affect. His behavior is normal. Judgment and thought content normal.      Assessment & Plan:   Derek Shepherd was seen today for follow-up.  Diagnoses and all orders for this visit:  Chronic obstructive pulmonary disease, unspecified COPD type ([place])  Uncontrolled type 2 diabetes mellitus with hyperglycemia ([place])  Bronchiectasis without complication ([place])  Depression, unspecified depression type       I have discontinued Derek Shepherd. Derek Shepherd's docusate sodium. I am also having him maintain his lactulose, multivitamin with minerals, Polyethyl Glycol-Propyl Glycol, umeclidinium bromide, SYMBICORT, levalbuterol, folic acid, tiotropium, metoprolol tartrate, clonazePAM, montelukast, nefazodone, sulfamethoxazole-trimethoprim, TRUBIOTICS, albuterol, oxymetazoline, triamcinolone cream, HYDROmorphone, torsemide, metFORMIN, pantoprazole, potassium chloride, venlafaxine, rOPINIRole, and glucose blood.  Allergies as of 06/02/2017      Reactions   Nsaids Shortness Of Breath   Pt states he can take ibuprofen occasionally with no reaction   Pregabalin Other (See Comments)   Fluid retention      Medication List        Accurate as of 06/02/17 11:59 PM. Always use your most recent med list.          clonazePAM 0.5 MG tablet Commonly known as:  KLONOPIN Take 1 mg by mouth at bedtime.   folic acid 1 MG tablet Commonly known as:  FOLVITE TAKE 1 TABLET BY MOUTH ONCE DAILY   glucose blood test strip Use as instructed   HYDROmorphone 4 MG tablet Commonly known as:  DILAUDID Take 4 mg by mouth 5 (five) times daily.   lactulose 10 GM/15ML  solution Commonly known as:  [place] Take by mouth daily as needed (constipation).   levalbuterol 1.25 MG/3ML nebulizer solution Commonly known as:  XOPENEX USE ONE VIAL IN NEBULIZER 4 TIMES DAILY   metFORMIN 500 MG tablet Commonly known as:  GLUCOPHAGE Take 1 tablet (500 mg total) by mouth 2 (two) times daily with a meal.   metoprolol tartrate 25 MG tablet Commonly known as:  LOPRESSOR Take 12.5 mg by mouth 2 (two) times daily.   montelukast 10 MG tablet Commonly known as:  SINGULAIR Take 10 mg by mouth daily.   multivitamin with minerals Tabs tablet Take 1 tablet by mouth daily.   nefazodone 50 MG tablet Commonly known as:  SERZONE Take 50 mg by mouth at bedtime.   oxymetazoline 0.05 % nasal spray Commonly known as:  AFRIN Place 1 spray into both nostrils daily as needed (nose bleeds).   pantoprazole 40 MG tablet Commonly known as:  PROTONIX Take 1 tablet (40 mg total) by mouth at bedtime.   potassium chloride 10 MEQ CR capsule Commonly known as:  MICRO-K TAKE THREE CAPSULES BY MOUTH TWICE DAILY   PROAIR HFA 108 (90 Base) MCG/ACT inhaler Generic drug:  albuterol Inhale 2 puffs into the lungs daily.   rOPINIRole 3 MG tablet Commonly known  as:  REQUIP TAKE 1 TABLET BY MOUTH ONCE DAILY AT BEDTIME   sulfamethoxazole-trimethoprim 800-160 MG tablet Commonly known as:  BACTRIM DS,SEPTRA DS Take 1 tablet by mouth every Monday, Wednesday, and Friday.   SYMBICORT 160-4.5 MCG/ACT inhaler Generic drug:  budesonide-formoterol INHALE TWO PUFFS BY MOUTH ONCE DAILY IN THE MORNING   SYSTANE 0.4-0.3 % Soln Generic drug:  Polyethyl Glycol-Propyl Glycol Place 1 drop into both eyes 5 (five) times daily as needed (for dry eyes).   tiotropium 18 MCG inhalation capsule Commonly known as:  SPIRIVA HANDIHALER Place 1 capsule (18 mcg total) daily into inhaler and inhale.   torsemide 100 MG tablet Commonly known as:  DEMADEX TAKE TWO TABLETS BY MOUTH ONCE DAILY    triamcinolone cream 0.1 % Commonly known as:  KENALOG Apply 1 application topically 2 (two) times daily as needed (rash/itching).   TRUBIOTICS Caps Take 1 capsule by mouth daily.   umeclidinium bromide 62.5 MCG/INH Aepb Commonly known as:  INCRUSE ELLIPTA Inhale 1 puff into the lungs daily.   venlafaxine 75 MG tablet Commonly known as:  EFFEXOR Take 1 tablet by mouth in the evening for 7 days then increase to 2 by mouth every evening.        Follow-up: Return in about 3 months (around 09/02/2017).  Claretta Fraise, M.D.

## 2017-06-03 DIAGNOSIS — R0902 Hypoxemia: Secondary | ICD-10-CM | POA: Diagnosis not present

## 2017-06-04 ENCOUNTER — Encounter: Payer: Self-pay | Admitting: Family Medicine

## 2017-06-05 ENCOUNTER — Other Ambulatory Visit: Payer: Self-pay | Admitting: Family Medicine

## 2017-06-06 ENCOUNTER — Encounter (HOSPITAL_COMMUNITY): Payer: Self-pay | Admitting: Emergency Medicine

## 2017-06-06 ENCOUNTER — Other Ambulatory Visit: Payer: Self-pay

## 2017-06-06 ENCOUNTER — Emergency Department (HOSPITAL_COMMUNITY): Payer: Medicare HMO

## 2017-06-06 ENCOUNTER — Ambulatory Visit (INDEPENDENT_AMBULATORY_CARE_PROVIDER_SITE_OTHER): Payer: Medicare HMO | Admitting: Family Medicine

## 2017-06-06 ENCOUNTER — Encounter: Payer: Self-pay | Admitting: Family Medicine

## 2017-06-06 ENCOUNTER — Observation Stay (HOSPITAL_COMMUNITY)
Admission: EM | Admit: 2017-06-06 | Discharge: 2017-06-07 | Disposition: A | Discharge status: Home / Self Care | Payer: Medicare HMO | Attending: Internal Medicine | Admitting: Internal Medicine

## 2017-06-06 VITALS — BP 130/85 | HR 119 | Temp 97.4°F | Ht 66.0 in | Wt 203.0 lb

## 2017-06-06 DIAGNOSIS — Z87898 Personal history of other specified conditions: Secondary | ICD-10-CM

## 2017-06-06 DIAGNOSIS — F341 Dysthymic disorder: Secondary | ICD-10-CM | POA: Diagnosis not present

## 2017-06-06 DIAGNOSIS — Z856 Personal history of leukemia: Secondary | ICD-10-CM | POA: Insufficient documentation

## 2017-06-06 DIAGNOSIS — Z8679 Personal history of other diseases of the circulatory system: Secondary | ICD-10-CM | POA: Diagnosis not present

## 2017-06-06 DIAGNOSIS — R079 Chest pain, unspecified: Secondary | ICD-10-CM | POA: Diagnosis not present

## 2017-06-06 DIAGNOSIS — Z7984 Long term (current) use of oral hypoglycemic drugs: Secondary | ICD-10-CM | POA: Insufficient documentation

## 2017-06-06 DIAGNOSIS — E1165 Type 2 diabetes mellitus with hyperglycemia: Secondary | ICD-10-CM | POA: Diagnosis present

## 2017-06-06 DIAGNOSIS — D649 Anemia, unspecified: Secondary | ICD-10-CM | POA: Diagnosis not present

## 2017-06-06 DIAGNOSIS — I5032 Chronic diastolic (congestive) heart failure: Secondary | ICD-10-CM | POA: Diagnosis present

## 2017-06-06 DIAGNOSIS — Z79899 Other long term (current) drug therapy: Secondary | ICD-10-CM | POA: Diagnosis not present

## 2017-06-06 DIAGNOSIS — R55 Syncope and collapse: Secondary | ICD-10-CM

## 2017-06-06 DIAGNOSIS — Z9481 Bone marrow transplant status: Secondary | ICD-10-CM | POA: Diagnosis not present

## 2017-06-06 DIAGNOSIS — D638 Anemia in other chronic diseases classified elsewhere: Secondary | ICD-10-CM | POA: Diagnosis present

## 2017-06-06 DIAGNOSIS — J449 Chronic obstructive pulmonary disease, unspecified: Secondary | ICD-10-CM | POA: Diagnosis present

## 2017-06-06 DIAGNOSIS — Z86711 Personal history of pulmonary embolism: Secondary | ICD-10-CM | POA: Diagnosis not present

## 2017-06-06 DIAGNOSIS — R5383 Other fatigue: Secondary | ICD-10-CM

## 2017-06-06 DIAGNOSIS — F418 Other specified anxiety disorders: Secondary | ICD-10-CM | POA: Diagnosis present

## 2017-06-06 DIAGNOSIS — Z87891 Personal history of nicotine dependence: Secondary | ICD-10-CM | POA: Insufficient documentation

## 2017-06-06 DIAGNOSIS — IMO0002 Reserved for concepts with insufficient information to code with codable children: Secondary | ICD-10-CM | POA: Diagnosis present

## 2017-06-06 DIAGNOSIS — R42 Dizziness and giddiness: Secondary | ICD-10-CM | POA: Diagnosis not present

## 2017-06-06 DIAGNOSIS — E119 Type 2 diabetes mellitus without complications: Secondary | ICD-10-CM | POA: Diagnosis not present

## 2017-06-06 DIAGNOSIS — R531 Weakness: Secondary | ICD-10-CM | POA: Diagnosis not present

## 2017-06-06 DIAGNOSIS — R69 Illness, unspecified: Secondary | ICD-10-CM | POA: Diagnosis not present

## 2017-06-06 DIAGNOSIS — I5042 Chronic combined systolic (congestive) and diastolic (congestive) heart failure: Secondary | ICD-10-CM | POA: Diagnosis not present

## 2017-06-06 DIAGNOSIS — C9150 Adult T-cell lymphoma/leukemia (HTLV-1-associated) not having achieved remission: Secondary | ICD-10-CM | POA: Diagnosis present

## 2017-06-06 DIAGNOSIS — R0602 Shortness of breath: Secondary | ICD-10-CM | POA: Diagnosis not present

## 2017-06-06 HISTORY — DX: Personal history of pulmonary embolism: Z86.711

## 2017-06-06 HISTORY — DX: Constipation, unspecified: K59.00

## 2017-06-06 HISTORY — DX: Type 2 diabetes mellitus without complications: E11.9

## 2017-06-06 LAB — COMPREHENSIVE METABOLIC PANEL
ALT: 25 U/L (ref 17–63)
ANION GAP: 13 (ref 5–15)
AST: 25 U/L (ref 15–41)
Albumin: 4.1 g/dL (ref 3.5–5.0)
Alkaline Phosphatase: 48 U/L (ref 38–126)
BUN: 34 mg/dL — ABNORMAL HIGH (ref 6–20)
CO2: 38 mmol/L — ABNORMAL HIGH (ref 22–32)
Calcium: 9.8 mg/dL (ref 8.9–10.3)
Chloride: 89 mmol/L — ABNORMAL LOW (ref 101–111)
Creatinine, Ser: 1.24 mg/dL (ref 0.61–1.24)
Glucose, Bld: 107 mg/dL — ABNORMAL HIGH (ref 65–99)
POTASSIUM: 4 mmol/L (ref 3.5–5.1)
Sodium: 140 mmol/L (ref 135–145)
TOTAL PROTEIN: 6.7 g/dL (ref 6.5–8.1)
Total Bilirubin: 0.8 mg/dL (ref 0.3–1.2)

## 2017-06-06 LAB — CBC WITH DIFFERENTIAL/PLATELET
BASOS ABS: 0 10*3/uL (ref 0.0–0.1)
Basophils Relative: 0 %
EOS PCT: 0 %
Eosinophils Absolute: 0 10*3/uL (ref 0.0–0.7)
HCT: 34.3 % — ABNORMAL LOW (ref 39.0–52.0)
Hemoglobin: 11.1 g/dL — ABNORMAL LOW (ref 13.0–17.0)
Lymphocytes Relative: 12 %
Lymphs Abs: 1.4 10*3/uL (ref 0.7–4.0)
MCH: 34.5 pg — AB (ref 26.0–34.0)
MCHC: 32.4 g/dL (ref 30.0–36.0)
MCV: 106.5 fL — AB (ref 78.0–100.0)
MONO ABS: 1.2 10*3/uL — AB (ref 0.1–1.0)
MONOS PCT: 10 %
NEUTROS ABS: 9.6 10*3/uL — AB (ref 1.7–7.7)
Neutrophils Relative %: 78 %
PLATELETS: 228 10*3/uL (ref 150–400)
RBC: 3.22 MIL/uL — ABNORMAL LOW (ref 4.22–5.81)
RDW: 15.2 % (ref 11.5–15.5)
WBC: 12.3 10*3/uL — ABNORMAL HIGH (ref 4.0–10.5)

## 2017-06-06 LAB — I-STAT CHEM 8, ED
BUN: 30 mg/dL — AB (ref 6–20)
CALCIUM ION: 1.15 mmol/L (ref 1.15–1.40)
Chloride: 88 mmol/L — ABNORMAL LOW (ref 101–111)
Creatinine, Ser: 1.3 mg/dL — ABNORMAL HIGH (ref 0.61–1.24)
GLUCOSE: 120 mg/dL — AB (ref 65–99)
HCT: 35 % — ABNORMAL LOW (ref 39.0–52.0)
Hemoglobin: 11.9 g/dL — ABNORMAL LOW (ref 13.0–17.0)
Potassium: 4 mmol/L (ref 3.5–5.1)
SODIUM: 137 mmol/L (ref 135–145)
TCO2: 37 mmol/L — AB (ref 22–32)

## 2017-06-06 LAB — MAGNESIUM: Magnesium: 2.1 mg/dL (ref 1.7–2.4)

## 2017-06-06 LAB — HEMOGLOBIN, FINGERSTICK: HEMOGLOBIN: 12.1 g/dL — AB (ref 12.6–17.7)

## 2017-06-06 LAB — TROPONIN I

## 2017-06-06 LAB — I-STAT TROPONIN, ED: Troponin i, poc: 0.01 ng/mL (ref 0.00–0.08)

## 2017-06-06 LAB — GLUCOSE, CAPILLARY: Glucose-Capillary: 82 mg/dL (ref 65–99)

## 2017-06-06 MED ORDER — APIXABAN 5 MG PO TABS
5.0000 mg | ORAL_TABLET | Freq: Two times a day (BID) | ORAL | Status: DC
  Administered 2017-06-06 – 2017-06-07 (×2): 5 mg via ORAL
  Filled 2017-06-06 (×2): qty 1

## 2017-06-06 MED ORDER — ACETAMINOPHEN 325 MG PO TABS: 650.0000 mg | ORAL_TABLET | Freq: Four times a day (QID) | ORAL | Status: DC | PRN

## 2017-06-06 MED ORDER — ADULT MULTIVITAMIN W/MINERALS CH
1.0000 | ORAL_TABLET | Freq: Every day | ORAL | Status: DC
  Administered 2017-06-07: 1 via ORAL
  Filled 2017-06-06: qty 1

## 2017-06-06 MED ORDER — METFORMIN HCL 500 MG PO TABS
500.0000 mg | ORAL_TABLET | Freq: Two times a day (BID) | ORAL | Status: DC
  Administered 2017-06-07 (×2): 500 mg via ORAL
  Filled 2017-06-06 (×2): qty 1

## 2017-06-06 MED ORDER — POLYVINYL ALCOHOL 1.4 % OP SOLN: 1.0000 [drp] | Freq: Every day | OPHTHALMIC | Status: DC | PRN

## 2017-06-06 MED ORDER — NITROGLYCERIN 0.4 MG SL SUBL: 0.4000 mg | SUBLINGUAL_TABLET | SUBLINGUAL | Status: DC | PRN

## 2017-06-06 MED ORDER — POLYETHYLENE GLYCOL 3350 17 G PO PACK
1.0000 | PACK | Freq: Every day | ORAL | Status: DC
  Filled 2017-06-06: qty 1

## 2017-06-06 MED ORDER — ONDANSETRON HCL 4 MG PO TABS
4.0000 mg | ORAL_TABLET | Freq: Four times a day (QID) | ORAL | Status: DC | PRN
Start: 2017-06-06 — End: 2017-06-07

## 2017-06-06 MED ORDER — MORPHINE SULFATE (PF) 4 MG/ML IV SOLN: 4.0000 mg | INTRAVENOUS | Status: DC | PRN

## 2017-06-06 MED ORDER — ACETAMINOPHEN 650 MG RE SUPP: 650.0000 mg | Freq: Four times a day (QID) | RECTAL | Status: DC | PRN

## 2017-06-06 MED ORDER — PANTOPRAZOLE SODIUM 40 MG PO TBEC
40.0000 mg | DELAYED_RELEASE_TABLET | Freq: Every day | ORAL | Status: DC
  Administered 2017-06-06: 40 mg via ORAL
  Filled 2017-06-06: qty 1

## 2017-06-06 MED ORDER — MOMETASONE FURO-FORMOTEROL FUM 200-5 MCG/ACT IN AERO
2.0000 | INHALATION_SPRAY | Freq: Two times a day (BID) | RESPIRATORY_TRACT | Status: DC
  Filled 2017-06-06: qty 8.8

## 2017-06-06 MED ORDER — LEVALBUTEROL HCL 1.25 MG/0.5ML IN NEBU: 1.2500 mg | INHALATION_SOLUTION | Freq: Four times a day (QID) | RESPIRATORY_TRACT | Status: DC | PRN

## 2017-06-06 MED ORDER — HYDROMORPHONE HCL 4 MG PO TABS: 4.0000 mg | ORAL_TABLET | ORAL | Status: DC | PRN

## 2017-06-06 MED ORDER — OXYMETAZOLINE HCL 0.05 % NA SOLN: 1.0000 | Freq: Every day | NASAL | Status: DC | PRN

## 2017-06-06 MED ORDER — ORAL CARE MOUTH RINSE
15.0000 mL | Freq: Two times a day (BID) | OROMUCOSAL | Status: DC
  Administered 2017-06-06 – 2017-06-07 (×2): 15 mL via OROMUCOSAL

## 2017-06-06 MED ORDER — TRIAMCINOLONE ACETONIDE 0.1 % EX CREA
1.0000 "application " | TOPICAL_CREAM | Freq: Two times a day (BID) | CUTANEOUS | Status: DC | PRN
  Filled 2017-06-06: qty 15

## 2017-06-06 MED ORDER — ASPIRIN 81 MG PO CHEW: 324.0000 mg | CHEWABLE_TABLET | Freq: Once | ORAL | Status: DC

## 2017-06-06 MED ORDER — ROPINIROLE HCL 1 MG PO TABS
3.0000 mg | ORAL_TABLET | Freq: Every day | ORAL | Status: DC
  Administered 2017-06-06: 3 mg via ORAL
  Filled 2017-06-06: qty 3

## 2017-06-06 MED ORDER — FOLIC ACID 1 MG PO TABS
1.0000 mg | ORAL_TABLET | Freq: Every day | ORAL | Status: DC
  Administered 2017-06-07: 1 mg via ORAL
  Filled 2017-06-06: qty 1

## 2017-06-06 MED ORDER — ONDANSETRON HCL 4 MG/2ML IJ SOLN: 4.0000 mg | Freq: Four times a day (QID) | INTRAMUSCULAR | Status: DC | PRN

## 2017-06-06 MED ORDER — MONTELUKAST SODIUM 10 MG PO TABS
10.0000 mg | ORAL_TABLET | Freq: Every day | ORAL | Status: DC
  Administered 2017-06-07: 10 mg via ORAL
  Filled 2017-06-06: qty 1

## 2017-06-06 MED ORDER — TORSEMIDE 20 MG PO TABS
200.0000 mg | ORAL_TABLET | Freq: Every day | ORAL | Status: DC
  Filled 2017-06-06: qty 10

## 2017-06-06 MED ORDER — CLOPIDOGREL BISULFATE 75 MG PO TABS
75.0000 mg | ORAL_TABLET | Freq: Once | ORAL | Status: AC
  Administered 2017-06-06: 75 mg via ORAL
  Filled 2017-06-06: qty 1

## 2017-06-06 MED ORDER — UMECLIDINIUM BROMIDE 62.5 MCG/INH IN AEPB
1.0000 | INHALATION_SPRAY | Freq: Every day | RESPIRATORY_TRACT | Status: DC
Start: 2017-06-07 — End: 2017-06-07
  Filled 2017-06-06: qty 7

## 2017-06-06 MED ORDER — NEFAZODONE HCL 100 MG PO TABS
50.0000 mg | ORAL_TABLET | Freq: Every day | ORAL | Status: DC
  Filled 2017-06-06 (×3): qty 1

## 2017-06-06 MED ORDER — VENLAFAXINE HCL 37.5 MG PO TABS
75.0000 mg | ORAL_TABLET | Freq: Every day | ORAL | Status: DC
  Administered 2017-06-07: 75 mg via ORAL
  Filled 2017-06-06: qty 2

## 2017-06-06 MED ORDER — TIOTROPIUM BROMIDE MONOHYDRATE 18 MCG IN CAPS
18.0000 ug | ORAL_CAPSULE | Freq: Every day | RESPIRATORY_TRACT | Status: DC
  Filled 2017-06-06: qty 5

## 2017-06-06 MED ORDER — METOPROLOL TARTRATE 25 MG PO TABS
12.5000 mg | ORAL_TABLET | Freq: Two times a day (BID) | ORAL | Status: DC
  Administered 2017-06-06 – 2017-06-07 (×2): 12.5 mg via ORAL
  Filled 2017-06-06 (×2): qty 1

## 2017-06-06 MED ORDER — LACTULOSE 10 GM/15ML PO SOLN: 20.0000 g | Freq: Every day | ORAL | Status: DC | PRN

## 2017-06-06 MED ORDER — CLONAZEPAM 0.5 MG PO TABS
1.0000 mg | ORAL_TABLET | Freq: Every day | ORAL | Status: DC
  Administered 2017-06-06: 1 mg via ORAL
  Filled 2017-06-06: qty 2

## 2017-06-06 MED ORDER — SULFAMETHOXAZOLE-TRIMETHOPRIM 800-160 MG PO TABS
1.0000 | ORAL_TABLET | ORAL | Status: DC
  Administered 2017-06-07: 1 via ORAL
  Filled 2017-06-06: qty 1

## 2017-06-06 MED ORDER — ALBUTEROL SULFATE (2.5 MG/3ML) 0.083% IN NEBU
3.0000 mL | INHALATION_SOLUTION | Freq: Every day | RESPIRATORY_TRACT | Status: DC
  Filled 2017-06-06: qty 3

## 2017-06-06 MED ORDER — POTASSIUM CHLORIDE CRYS ER 10 MEQ PO TBCR
30.0000 meq | EXTENDED_RELEASE_TABLET | Freq: Two times a day (BID) | ORAL | Status: DC
  Administered 2017-06-06 – 2017-06-07 (×2): 30 meq via ORAL
  Filled 2017-06-06 (×2): qty 3

## 2017-06-06 NOTE — ED Triage Notes (Signed)
Pt went to PCP today for chest pain, SOB. sent here to have evaluation. Pt used 2L home 02

## 2017-06-06 NOTE — Progress Notes (Signed)
BP 130/85   Pulse (!) 119   Temp (!) 97.4 F (36.3 C) (Oral)   Ht 5\' 6"  (1.676 m)   Wt 203 lb (92.1 kg)   BMI 32.77 kg/m    Subjective:    Patient ID: Derek Shepherd, male    DOB: 12-Jan-1956, 62 y.o.   MRN: [id number]  HPI: Derek Shepherd is a 62 y.o. male presenting on 06/06/2017 for Fatigue, weakness, dizziness when he first stands up to walk (symptoms x 2 days)   HPI Dizziness and weakness and chest pressure Patient comes in with dizziness and weakness and chest pressure that has been going on for the past day.  He says the chest pressure has really popped up today but the dizziness and feeling like he is going to pass out has been going on since yesterday.  He says he feels like something is off and has had any has moments where he feels like he have to wax out and he cannot get up without feeling dizzy and he feels like he is going to fall.  He denies any fevers or chills or cough or congestion more than what he normally has with his underlying COPD.  He denies any shortness of breath more than what he normally has.  He says that he has had some moments over the past few years that he has been driving and he feels like everything goes black and then he has to pull over to the side of the road.  He says this been happening over the past day as well.  He describes the pressure in his chest as a dull achy pain and rates it as mild but persistent throughout today since about 11 AM this morning.  He said the dizziness and feeling like he is going to pass out since yesterday afternoon.  He has had the chest pressure previously but not to the degree that he is having it currently or in the persistent nature for which he has it currently.  Relevant past medical, surgical, family and social history reviewed and updated as indicated. Interim medical history since our last visit reviewed. Allergies and medications reviewed and updated.  Review of Systems  Constitutional: Positive for fatigue.  Negative for chills and fever.  HENT: Positive for congestion. Negative for sinus pressure, sinus pain and sore throat.   Eyes: Negative for discharge.  Respiratory: Positive for cough and chest tightness. Negative for shortness of breath and wheezing.   Cardiovascular: Positive for chest pain. Negative for leg swelling.  Musculoskeletal: Negative for back pain and gait problem.  Skin: Negative for rash.  Neurological: Positive for dizziness, weakness and light-headedness. Negative for speech difficulty and numbness.  All other systems reviewed and are negative.   Per HPI unless specifically indicated above   Allergies as of 06/06/2017      Reactions   Nsaids Shortness Of Breath   Pt states he can take ibuprofen occasionally with no reaction   Pregabalin Other (See Comments)   Fluid retention      Medication List        Accurate as of 06/06/17  3:52 PM. Always use your most recent med list.          apixaban 5 MG Tabs tablet Commonly known as:  ELIQUIS Take 5 mg by mouth 2 (two) times daily.   clonazePAM 0.5 MG tablet Commonly known as:  KLONOPIN Take 1 mg by mouth at bedtime.   folic acid 1 MG tablet  Commonly known as:  FOLVITE TAKE 1 TABLET BY MOUTH ONCE DAILY   glucose blood test strip Use as instructed   HYDROmorphone 4 MG tablet Commonly known as:  DILAUDID Take 4 mg by mouth 5 (five) times daily.   lactulose 10 GM/15ML solution Commonly known as:  Salome Take by mouth daily as needed (constipation).   levalbuterol 1.25 MG/3ML nebulizer solution Commonly known as:  XOPENEX USE ONE VIAL IN NEBULIZER 4 TIMES DAILY   metFORMIN 500 MG tablet Commonly known as:  GLUCOPHAGE Take 1 tablet (500 mg total) by mouth 2 (two) times daily with a meal.   metoprolol tartrate 25 MG tablet Commonly known as:  LOPRESSOR Take 12.5 mg by mouth 2 (two) times daily.   montelukast 10 MG tablet Commonly known as:  SINGULAIR Take 10 mg by mouth daily.   multivitamin  with minerals Tabs tablet Take 1 tablet by mouth daily.   nefazodone 50 MG tablet Commonly known as:  SERZONE Take 50 mg by mouth at bedtime.   oxymetazoline 0.05 % nasal spray Commonly known as:  AFRIN Place 1 spray into both nostrils daily as needed (nose bleeds).   pantoprazole 40 MG tablet Commonly known as:  PROTONIX Take 1 tablet (40 mg total) by mouth at bedtime.   potassium chloride 10 MEQ CR capsule Commonly known as:  MICRO-K TAKE THREE CAPSULES BY MOUTH TWICE DAILY   PROAIR HFA 108 (90 Base) MCG/ACT inhaler Generic drug:  albuterol Inhale 2 puffs into the lungs daily.   rOPINIRole 3 MG tablet Commonly known as:  REQUIP TAKE 1 TABLET BY MOUTH ONCE DAILY AT BEDTIME   sulfamethoxazole-trimethoprim 800-160 MG tablet Commonly known as:  BACTRIM DS,SEPTRA DS Take 1 tablet by mouth every Monday, Wednesday, and Friday.   SYMBICORT 160-4.5 MCG/ACT inhaler Generic drug:  budesonide-formoterol INHALE TWO PUFFS BY MOUTH ONCE DAILY IN THE MORNING   SYSTANE 0.4-0.3 % Soln Generic drug:  Polyethyl Glycol-Propyl Glycol Place 1 drop into both eyes 5 (five) times daily as needed (for dry eyes).   tiotropium 18 MCG inhalation capsule Commonly known as:  SPIRIVA HANDIHALER Place 1 capsule (18 mcg total) daily into inhaler and inhale.   torsemide 100 MG tablet Commonly known as:  DEMADEX TAKE TWO TABLETS BY MOUTH ONCE DAILY   triamcinolone cream 0.1 % Commonly known as:  KENALOG Apply 1 application topically 2 (two) times daily as needed (rash/itching).   TRUBIOTICS Caps Take 1 capsule by mouth daily.   umeclidinium bromide 62.5 MCG/INH Aepb Commonly known as:  INCRUSE ELLIPTA Inhale 1 puff into the lungs daily.   venlafaxine 75 MG tablet Commonly known as:  EFFEXOR TAKE 1 TABLET BY MOUTH IN THE EVENING FOR 7 DAYS THEN INCREASE TO 2 BY MOUTH EVERY EVENING          Objective:    BP 130/85   Pulse (!) 119   Temp (!) 97.4 F (36.3 C) (Oral)   Ht 5\' 6"  (1.676  m)   Wt 203 lb (92.1 kg)   BMI 32.77 kg/m   Wt Readings from Last 3 Encounters:  06/06/17 203 lb (92.1 kg)  06/02/17 205 lb (93 kg)  05/02/17 205 lb 4 oz (93.1 kg)    Physical Exam  Constitutional: He is oriented to person, place, and time. He appears well-developed and well-nourished. No distress.  Eyes: Conjunctivae are normal. No scleral icterus.  Neck: Neck supple. No thyromegaly present.  Cardiovascular: Normal rate, regular rhythm, S1 normal, S2 normal, normal heart sounds  and intact distal pulses.  No murmur heard. Pulmonary/Chest: Effort normal. No respiratory distress. He has decreased breath sounds in the right lower field and the left lower field. He has no wheezes. He has no rales. He exhibits tenderness.    Musculoskeletal: Normal range of motion. He exhibits no edema.  Lymphadenopathy:    He has no cervical adenopathy.  Neurological: He is alert and oriented to person, place, and time. Coordination normal.  Skin: Skin is warm and dry. No rash noted. He is not diaphoretic.  Psychiatric: He has a normal mood and affect. His behavior is normal.  Nursing note and vitals reviewed. Patient is already on nasal cannula oxygen and is on it chronically  EKG sinus rhythm, appears unchanged from previous, normal sinus rhythm  Hemoglobin 12.1    Assessment & Plan:   Problem List Items Addressed This Visit    None    Visit Diagnoses    Chest pain, unspecified type    -  Primary   Relevant Orders   EKG 12-Lead (Completed)   Fatigue, unspecified type       Relevant Orders   Hemoglobin, fingerstick   Near syncope       Relevant Medications   apixaban (ELIQUIS) 5 MG TABS tablet    Because of patient's feeling like he is going to pass out and cannot stand up and dizziness.  We are going to send him to the emergency department.  He has elected to not go via EMS and will go via transportation with his daughter but they will go directly to [facility], we have  called emergency department warned that he is coming  Follow up plan: Return if symptoms worsen or fail to improve.  Counseling provided for all of the vaccine components Orders Placed This Encounter  Procedures  . Hemoglobin, fingerstick  . EKG 12-Lead    Caryl Pina, MD [facility] 06/06/2017, 3:52 PM

## 2017-06-06 NOTE — ED Notes (Signed)
Patient transported to X-ray 
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
## 2017-06-06 NOTE — ED Provider Notes (Addendum)
[facility] EMERGENCY DEPARTMENT Provider Note   CSN: [id number] Arrival date & time: 06/06/17  1659     History   Chief Complaint Chief Complaint  Patient presents with  . Chest Pain    HPI DMONTE MAHER is a 62 y.o. male.  Patient sent here from his primary care physician's office.  Patient presents with anterior chest pain intermittent for the past 2 days and symptoms of generalized weakness.  Weakness and lightheadedness occurs when he exerts himself and improves with rest.  Chest pain occurs at rest.  He is presently asymptomatic.  He is chronically short of breath which is unchanged from his baseline.  He denies any nausea or sweatiness.  No other associated symptoms.  No treatment prior to coming here  HPI  Past Medical History:  Diagnosis Date  . Adenomatous colon polyp    tubular  . Anemia   . Anxiety   . Arthritis   . Asthma   . Bowel obstruction (HCC)    constipation  . CHF (congestive heart failure) ([place])   . Complication of anesthesia    ' I am not in good enough shape for surgery "  . COPD (chronic obstructive pulmonary disease) ([place])   . Depression   . Diabetes mellitus without complication ([place])    type 2   . Diverticulosis   . DVT (deep venous thrombosis) ([place])   . Gallstones   . GERD (gastroesophageal reflux disease)   . 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 History of bone marrow transplant ([place])   . 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 Personal history of colonic  adenoma 01/22/2008  . Syncope and collapse 01/29/2017    Patient Active Problem List   Diagnosis Date Noted  . Orthostatic hypotension 01/29/2017  . Thrombocytopenia due to drugs 01/29/2017  . COPD (chronic obstructive pulmonary disease)/BOOP 01/29/2017  . Chronic respiratory failure with hypoxia and hypercapnia ([place]) 01/29/2017  . History of chronic pulmonary embolism 01/29/2017  . Chronic systolic heart  failure ([place]) 01/29/2017  . Anemia 01/29/2017  . History of Leukemia-lymphoma, T-cell, acute, HTLV-I-associated (s/p BMT 2011) 01/29/2017  . Diabetes mellitus type 2, uncontrolled ([place]) 01/29/2017  . Acute kidney injury ([place]) 01/29/2017  . Chronic hypercapnic respiratory failure ([place]) 01/23/2017  . Sepsis ([place]) 01/02/2017  . Fever 01/02/2017  . Bronchiectasis ([place]) 12/13/2016  . Symptomatic anemia 04/07/2016  . Hyponatremia 03/04/2016  . Panlobular emphysema ([place]) 12/22/2015  . Anemia of chronic disease 11/24/2015  . Chronic pulmonary embolism ([place]) 11/24/2015  . Thrombocytopenia ([place]) 11/24/2015  . Depression 11/24/2015  . Chronic combined systolic and diastolic CHF (congestive heart failure) ([place]) 11/24/2015  . Controlled type 2 diabetes mellitus without complication, without long-term current use of insulin ([place]) 11/24/2015  . History of bone marrow transplant ([place])   . 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 BONE MARROW TRANSPLANT  2011  . CHOLECYSTECTOMY    . COLONOSCOPY W/ BIOPSIES    . EXPLORATORY LAPAROTOMY    . HERNIA REPAIR     x 2  . LUNG SURGERY Left         Home Medications    Prior to Admission medications   Medication Sig Start Date End Date Taking? Authorizing Provider  albuterol (PROAIR HFA) 108 (90 Base) MCG/ACT inhaler Inhale 2 puffs into the lungs daily.    [provider]  apixaban Arne [place]) 5  MG TABS tablet Take 5 mg by mouth 2 (two) times daily. 06/05/17   [provider]  clonazePAM (KLONOPIN) 0.5 MG tablet Take 1 mg by mouth at bedtime.     [provider]  folic acid (FOLVITE) 1 MG tablet TAKE 1 TABLET BY MOUTH ONCE DAILY Patient taking differently: TAKE 1 TABLET  (1 MG )BY MOUTH ONCE DAILY 12/16/16   Claretta Fraise, MD  glucose blood test strip Use as instructed 05/02/17   Claretta Fraise, MD  HYDROmorphone (DILAUDID) 4 MG tablet Take 4 mg by  mouth 5 (five) times daily.    [provider]  lactulose (CHRONULAC) 10 GM/15ML solution Take by mouth daily as needed (constipation).     [provider]  levalbuterol (XOPENEX) 1.25 MG/3ML nebulizer solution USE ONE VIAL IN NEBULIZER 4 TIMES DAILY Patient taking differently: USE ONE VIAL (0.125 MG)  IN NEBULIZER 2 TIMES DAILY 08/19/16   Claretta Fraise, MD  metFORMIN (GLUCOPHAGE) 500 MG tablet Take 1 tablet (500 mg total) by mouth 2 (two) times daily with a meal. 03/28/17   Claretta Fraise, MD  metoprolol tartrate (LOPRESSOR) 25 MG tablet Take 12.5 mg by mouth 2 (two) times daily.    [provider]  montelukast (SINGULAIR) 10 MG tablet Take 10 mg by mouth daily.    [provider]  Multiple Vitamin (MULTIVITAMIN WITH MINERALS) TABS tablet Take 1 tablet by mouth daily.    [provider]  nefazodone (SERZONE) 50 MG tablet Take 50 mg by mouth at bedtime. 01/04/17   [provider]  oxymetazoline (AFRIN) 0.05 % nasal spray Place 1 spray into both nostrils daily as needed (nose bleeds).    [provider]  pantoprazole (PROTONIX) 40 MG tablet Take 1 tablet (40 mg total) by mouth at bedtime. 03/28/17   Claretta Fraise, MD  Polyethyl Glycol-Propyl Glycol (SYSTANE) 0.4-0.3 % SOLN Place 1 drop into both eyes 5 (five) times daily as needed (for dry eyes).     [provider]  potassium chloride (MICRO-K) 10 MEQ CR capsule TAKE THREE CAPSULES BY MOUTH TWICE DAILY 03/28/17   Claretta Fraise, MD  Probiotic Product (TRUBIOTICS) CAPS Take 1 capsule by mouth daily.    [provider]  rOPINIRole (REQUIP) 3 MG tablet TAKE 1 TABLET BY MOUTH ONCE DAILY AT BEDTIME 04/27/17   Claretta Fraise, MD  sulfamethoxazole-trimethoprim (BACTRIM DS,SEPTRA DS) 800-160 MG tablet Take 1 tablet by mouth every Monday, Wednesday, and Friday.    [provider]  SYMBICORT 160-4.5 MCG/ACT inhaler INHALE TWO PUFFS BY MOUTH ONCE DAILY IN THE MORNING Patient  taking differently: INHALE TWO PUFFS BY MOUTH TWICE DAILY 08/19/16   Claretta Fraise, MD  tiotropium (SPIRIVA HANDIHALER) 18 MCG inhalation capsule Place 1 capsule (18 mcg total) daily into inhaler and inhale. 01/17/17   Claretta Fraise, MD  torsemide (DEMADEX) 100 MG tablet TAKE TWO TABLETS BY MOUTH ONCE DAILY 02/23/17   Claretta Fraise, MD  triamcinolone cream (KENALOG) 0.1 % Apply 1 application topically 2 (two) times daily as needed (rash/itching).    [provider]  umeclidinium bromide (INCRUSE ELLIPTA) 62.5 MCG/INH AEPB Inhale 1 puff into the lungs daily. 06/07/16   Claretta Fraise, MD  venlafaxine (EFFEXOR) 75 MG tablet TAKE 1 TABLET BY MOUTH IN THE EVENING FOR 7 DAYS THEN INCREASE TO 2 BY MOUTH EVERY EVENING 06/05/17   Claretta Fraise, MD    Family History Family History  Problem Relation Age of Onset  . Clotting disorder Mother   . Heart  attack Mother   . Diabetes Father   . Heart attack Father   . Prostate cancer Brother   . Diabetes Brother        x 3  . Diabetes Sister        x 3  . Diabetes Maternal Aunt     Social History Social History   Tobacco Use  . Smoking status: Former Smoker    Types: Cigarettes    Last attempt to quit: 07/13/1993    Years since quitting: 23.9  . Smokeless tobacco: Never Used  Substance Use Topics  . Alcohol use: Yes    Alcohol/week: 0.0 oz    Comment: socially  . Drug use: No     Allergies   Nsaids and Pregabalin   Review of Systems Review of Systems  Constitutional: Negative.   HENT: Negative.   Respiratory: Positive for shortness of breath.        Chronic dyspnea  Cardiovascular: Positive for chest pain.  Gastrointestinal: Negative.   Musculoskeletal: Negative.   Skin: Negative.   Allergic/Immunologic: Positive for immunocompromised state.       Leukemia patient  Neurological: Positive for weakness and light-headedness.  Psychiatric/Behavioral: Negative.   All other systems reviewed and are negative.    Physical  Exam Updated Vital Signs BP (!) 132/95 (BP Location: Left Arm)   Pulse 81   Temp 97.7 F (36.5 C) (Oral)   Resp 14   Ht 5\' 6"  (1.676 m)   Wt 92.1 kg (203 lb)   SpO2 100%   BMI 32.77 kg/m   Physical Exam  Constitutional: He appears well-developed and well-nourished.  HENT:  Head: Normocephalic and atraumatic.  Eyes: Pupils are equal, round, and reactive to light. Conjunctivae are normal.  Neck: Neck supple. No tracheal deviation present. No thyromegaly present.  Cardiovascular: Normal rate and regular rhythm.  No murmur heard. Pulmonary/Chest: Effort normal and breath sounds normal.  Abdominal: Soft. Bowel sounds are normal. He exhibits no distension. There is no tenderness.  Musculoskeletal: Normal range of motion. He exhibits no edema or tenderness.  Neurological: He is alert. Coordination normal.  Skin: Skin is warm and dry. No rash noted.  Psychiatric: He has a normal mood and affect.  Nursing note and vitals reviewed.    ED Treatments / Results  Labs (all labs ordered are listed, but only abnormal results are displayed) Labs Reviewed  I-STAT CHEM 8, ED  I-STAT TROPONIN, ED    EKG EKG Interpretation  Date/Time:  Tuesday June 06 2017 17:09:28 EDT Ventricular Rate:  96 PR Interval:    QRS Duration: 87 QT Interval:  360 QTC Calculation: 455 R Axis:   10 Text Interpretation:  Sinus rhythm No significant change since last tracing Confirmed by Orlie Dakin [phone]) on 06/06/2017 5:13:21 PM   Radiology No results found.  Procedures Procedures (including critical care time)  Medications Ordered in ED Medications - No data to display  Chest x-ray viewed by me Results for orders placed or performed during the hospital encounter of 06/06/17  I-stat chem 8, ed  Result Value Ref Range   Sodium 137 135 - 145 mmol/L   Potassium 4.0 3.5 - 5.1 mmol/L   Chloride 88 (L) 101 - 111 mmol/L   BUN 30 (H) 6 - 20 mg/dL   Creatinine, Ser 1.30 (H) 0.61 - 1.24 mg/dL    Glucose, Bld 120 (H) 65 - 99 mg/dL   Calcium, Ion 1.15 1.15 - 1.40 mmol/L   TCO2 37 (H) 22 -  32 mmol/L   Hemoglobin 11.9 (L) 13.0 - 17.0 g/dL   HCT 35.0 (L) 39.0 - 52.0 %  I-stat troponin, ED  Result Value Ref Range   Troponin i, poc 0.01 0.00 - 0.08 ng/mL   Comment 3           Dg Chest 2 View  Result Date: 06/06/2017 CLINICAL DATA:  Intermittent chest pain. EXAM: CHEST - 2 VIEW COMPARISON:  Chest x-ray dated January 29, 2017. FINDINGS: Interval replacement of the right chest wall port catheter, with the tip in the proximal right atrium. The heart size and mediastinal contours are within normal limits. Normal pulmonary vascularity. Low lung volumes. No focal consolidation, pleural effusion, or pneumothorax. No acute osseous abnormality. Postsurgical changes along the left fifth and sixth ribs, similar to prior study. IMPRESSION: No active cardiopulmonary disease. Electronically Signed   By: Titus Dubin M.D.   On: 06/06/2017 18:23   Initial Impression / Assessment and Plan / ED Course  I have reviewed the triage vital signs and the nursing notes.  Pertinent labs & imaging results that were available during my care of the patient were reviewed by me and considered in my medical decision making (see chart for details).   6:40 PM patient reports that he had a few brief episodes of chest pain while in the emergency department, however he is presently asymptomatic  Plavix ordered as patient is allergic to NSAIDs.  Heart score equals 3 .  Clinical suspicion moderate for acute coronary syndrome. Consulted Dr.Ortiz who will arrange for overnight stay lab work consistent with mild renal insufficiency.   Final Clinical Impressions(s) / ED Diagnoses  Diagnosis #1 chest pain at rest #2 renal insufficiency #3 weakness Final diagnoses:  None    ED Discharge Orders    None       Orlie Dakin, MD 06/06/17 1900    Orlie Dakin, MD 06/06/17 1901

## 2017-06-06 NOTE — ED Notes (Addendum)
Ekg handed to Dr. Kathrynn Humble

## 2017-06-07 ENCOUNTER — Encounter (HOSPITAL_COMMUNITY): Payer: Self-pay | Admitting: Cardiology

## 2017-06-07 ENCOUNTER — Observation Stay (HOSPITAL_BASED_OUTPATIENT_CLINIC_OR_DEPARTMENT_OTHER): Payer: Medicare HMO

## 2017-06-07 ENCOUNTER — Observation Stay (HOSPITAL_COMMUNITY): Payer: Medicare HMO

## 2017-06-07 DIAGNOSIS — I5030 Unspecified diastolic (congestive) heart failure: Secondary | ICD-10-CM

## 2017-06-07 DIAGNOSIS — I6523 Occlusion and stenosis of bilateral carotid arteries: Secondary | ICD-10-CM | POA: Diagnosis not present

## 2017-06-07 DIAGNOSIS — J449 Chronic obstructive pulmonary disease, unspecified: Secondary | ICD-10-CM

## 2017-06-07 DIAGNOSIS — E119 Type 2 diabetes mellitus without complications: Secondary | ICD-10-CM | POA: Diagnosis not present

## 2017-06-07 DIAGNOSIS — Z86711 Personal history of pulmonary embolism: Secondary | ICD-10-CM | POA: Diagnosis not present

## 2017-06-07 DIAGNOSIS — R55 Syncope and collapse: Secondary | ICD-10-CM | POA: Diagnosis not present

## 2017-06-07 DIAGNOSIS — R42 Dizziness and giddiness: Secondary | ICD-10-CM | POA: Diagnosis not present

## 2017-06-07 DIAGNOSIS — Z9481 Bone marrow transplant status: Secondary | ICD-10-CM | POA: Diagnosis not present

## 2017-06-07 DIAGNOSIS — R079 Chest pain, unspecified: Secondary | ICD-10-CM | POA: Diagnosis not present

## 2017-06-07 DIAGNOSIS — C9150 Adult T-cell lymphoma/leukemia (HTLV-1-associated) not having achieved remission: Secondary | ICD-10-CM

## 2017-06-07 DIAGNOSIS — R69 Illness, unspecified: Secondary | ICD-10-CM | POA: Diagnosis not present

## 2017-06-07 DIAGNOSIS — E1165 Type 2 diabetes mellitus with hyperglycemia: Secondary | ICD-10-CM | POA: Diagnosis not present

## 2017-06-07 DIAGNOSIS — Z7984 Long term (current) use of oral hypoglycemic drugs: Secondary | ICD-10-CM | POA: Diagnosis not present

## 2017-06-07 DIAGNOSIS — R0789 Other chest pain: Secondary | ICD-10-CM

## 2017-06-07 DIAGNOSIS — I5042 Chronic combined systolic (congestive) and diastolic (congestive) heart failure: Secondary | ICD-10-CM

## 2017-06-07 DIAGNOSIS — Z79899 Other long term (current) drug therapy: Secondary | ICD-10-CM | POA: Diagnosis not present

## 2017-06-07 DIAGNOSIS — D649 Anemia, unspecified: Secondary | ICD-10-CM | POA: Diagnosis not present

## 2017-06-07 DIAGNOSIS — I2510 Atherosclerotic heart disease of native coronary artery without angina pectoris: Secondary | ICD-10-CM | POA: Diagnosis not present

## 2017-06-07 LAB — TROPONIN I
TROPONIN I: 0.03 ng/mL — AB (ref <0.03)
TROPONIN I: 0.03 ng/mL — AB (ref <0.03)
TROPONIN I: 0.03 ng/mL — AB (ref <0.03)

## 2017-06-07 LAB — GLUCOSE, CAPILLARY
GLUCOSE-CAPILLARY: 82 mg/dL (ref 65–99)
Glucose-Capillary: 85 mg/dL (ref 65–99)
Glucose-Capillary: 75 mg/dL (ref 65–99)

## 2017-06-07 LAB — HEMOGLOBIN A1C
HEMOGLOBIN A1C: 6.3 % — AB (ref 4.8–5.6)
MEAN PLASMA GLUCOSE: 134.11 mg/dL

## 2017-06-07 LAB — ECHOCARDIOGRAM COMPLETE
HEIGHTINCHES: 66 in
Weight: 3110.4 oz

## 2017-06-07 LAB — LIPID PANEL
CHOLESTEROL: 191 mg/dL (ref 0–200)
HDL: 59 mg/dL (ref >40)
LDL Cholesterol: 104 mg/dL — ABNORMAL HIGH (ref 0–99)
Total CHOL/HDL Ratio: 3.2 RATIO
Triglycerides: 140 mg/dL (ref <150)
VLDL: 28 mg/dL (ref 0–40)

## 2017-06-07 MED ORDER — LOPERAMIDE HCL 2 MG PO CAPS
2.0000 mg | ORAL_CAPSULE | Freq: Once | ORAL | Status: AC
  Administered 2017-06-07: 2 mg via ORAL
  Filled 2017-06-07: qty 1

## 2017-06-07 MED ORDER — TORSEMIDE 20 MG PO TABS: 100.0000 mg | ORAL_TABLET | Freq: Every day | ORAL | Status: DC

## 2017-06-07 MED ORDER — SODIUM CHLORIDE 0.9 % IV SOLN
INTRAVENOUS | Status: DC
  Administered 2017-06-07: 12:00:00 via INTRAVENOUS

## 2017-06-07 MED ORDER — TORSEMIDE 20 MG PO TABS: 80.0000 mg | ORAL_TABLET | Freq: Every day | ORAL | 0 refills | Status: DC

## 2017-06-07 NOTE — Care Management Obs Status (Signed)
[place] NOTIFICATION   Patient Details  Name: Derek Shepherd MRN: [id number] Date of Birth: 1955/04/14   Medicare Observation Status Notification Given:  Yes    Sherald Barge, RN 06/07/2017, 4:26 PM

## 2017-06-07 NOTE — Progress Notes (Signed)
CRITICAL VALUE ALERT  Critical Value:  Troponin 0.03  Date & Time Notied:  06/07/17, 0301  Provider Notified: Manuella Ghazi  Orders Received/Actions taken:

## 2017-06-07 NOTE — Progress Notes (Signed)
*  PRELIMINARY RESULTS* Echocardiogram 2D Echocardiogram has been performed.  Leavy Cella 06/07/2017, 10:42 AM

## 2017-06-07 NOTE — Progress Notes (Signed)
Patient ambulated from room in hallway around nursing station and back to room with O2 at 2 L/min without any complaints of lightheadedness or dizziness. Patient did complain of being SOB, but no more than usual.

## 2017-06-07 NOTE — Discharge Summary (Signed)
Physician Discharge Summary  Derek Shepherd HYW:[id number] DOB: 1956-01-03 DOA: 06/06/2017  PCP: Claretta Fraise, MD  Admit date: 06/06/2017 Discharge date: 06/07/2017  Admitted From: Home Disposition: Home  Recommendations for Outpatient Follow-up:  1. Follow up with PCP in 1-2 weeks 2. Please obtain BMP/CBC in one week 3. 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  Patient was admitted to the hospital and monitored on telemetry.  He did not have any significant cardiac arrhythmias.  Echocardiogram did not indicate any significant findings other than low central venous pressure.  Review of labs indicate mild bump in creatinine.  He was also noted to be mildly orthostatic.  Initially, blood pressure trended down to the 80s on standing and he was symptomatic.  After receiving IV fluids, although he still has some mild orthostasis, he is not becoming hypotensive or symptomatic.  He is no longer dizzy on standing.  He is able to ambulate at his baseline without difficulty.  Further IV fluids will be discontinued.  Patient does take 100 mg of Demadex daily.  This will be reduced to 80 mg daily.  He ruled out for ACS with negative cardiac markers.  He is not had any further chest pain since arrival to the hospital.  He was seen by cardiology and will likely follow up with them to be considered for Myoview for further evaluation of coronary artery calcifications noted on CT scan.  Patient is otherwise stable for  discharge.  Discharge Diagnoses:  Principal Problem:   Chest pain Active Problems:   Anemia of chronic disease   History of bone marrow transplant (HCC)   Chronic combined systolic and diastolic CHF (congestive heart failure) (HCC)   COPD (chronic obstructive pulmonary disease)/BOOP   History of chronic pulmonary embolism   History of Leukemia-lymphoma, T-cell, acute, HTLV-I-associated (s/p BMT 2011)   Diabetes mellitus type 2, uncontrolled ([place])   History of syncope   Depression with anxiety    Discharge Instructions  Discharge Instructions    Diet - low sodium heart healthy   Complete by:  As directed    Increase activity slowly   Complete by:  As directed      Allergies as of 06/07/2017      Reactions   Nsaids Shortness Of Breath   Pt states he can take ibuprofen occasionally with no reaction   Pregabalin Other (See Comments)   Fluid retention      Medication List    TAKE these medications   apixaban 5 MG Tabs tablet Commonly known as:  ELIQUIS Take 5 mg by mouth 2 (two) times daily.   clonazePAM 0.5 MG tablet Commonly known as:  KLONOPIN Take 1 mg by mouth at bedtime.   folic acid 1 MG tablet Commonly known as:  FOLVITE TAKE 1 TABLET BY MOUTH ONCE DAILY What changed:    how much to take  how to take this  when to take this   glucose blood test strip Use as instructed   HYDROmorphone 4 MG tablet Commonly known as:  DILAUDID Take 4 mg by mouth 5 (five) times  daily.   lactulose 10 GM/15ML solution Commonly known as:  [place] Take by mouth daily as needed (constipation).   levalbuterol 1.25 MG/3ML nebulizer solution Commonly known as:  XOPENEX USE ONE VIAL IN NEBULIZER 4 TIMES DAILY What changed:  See the new instructions.   metFORMIN 500 MG tablet Commonly known as:  GLUCOPHAGE Take 1 tablet (500 mg total) by mouth 2 (two) times daily with a meal.   metoprolol tartrate 25 MG tablet Commonly known as:  LOPRESSOR Take 12.5 mg by mouth 2  (two) times daily.   montelukast 10 MG tablet Commonly known as:  SINGULAIR Take 10 mg by mouth daily.   multivitamin with minerals Tabs tablet Take 1 tablet by mouth daily.   nefazodone 50 MG tablet Commonly known as:  SERZONE Take 50 mg by mouth at bedtime.   oxymetazoline 0.05 % nasal spray Commonly known as:  AFRIN Place 1 spray into both nostrils daily as needed (nose bleeds).   pantoprazole 40 MG tablet Commonly known as:  PROTONIX Take 1 tablet (40 mg total) by mouth at bedtime.   polyethylene glycol packet Commonly known as:  MIRALAX / GLYCOLAX Take 1 packet by mouth daily.   potassium chloride 10 MEQ CR capsule Commonly known as:  MICRO-K TAKE THREE CAPSULES BY MOUTH TWICE DAILY   PROAIR HFA 108 (90 Base) MCG/ACT inhaler Generic drug:  albuterol Inhale 2 puffs into the lungs daily.   rOPINIRole 3 MG tablet Commonly known as:  REQUIP TAKE 1 TABLET BY MOUTH ONCE DAILY AT BEDTIME   sulfamethoxazole-trimethoprim 800-160 MG tablet Commonly known as:  BACTRIM DS,SEPTRA DS Take 1 tablet by mouth every Monday, Wednesday, and Friday.   SYMBICORT 160-4.5 MCG/ACT inhaler Generic drug:  budesonide-formoterol INHALE TWO PUFFS BY MOUTH ONCE DAILY IN THE MORNING What changed:  See the new instructions.   SYSTANE 0.4-0.3 % Soln Generic drug:  Polyethyl Glycol-Propyl Glycol Place 1 drop into both eyes 5 (five) times daily as needed (for dry eyes).   tiotropium 18 MCG inhalation capsule Commonly known as:  SPIRIVA HANDIHALER Place 1 capsule (18 mcg total) daily into inhaler and inhale.   torsemide 20 MG tablet Commonly known as:  DEMADEX Take 4 tablets (80 mg total) by mouth daily. What changed:    medication strength  how much to take   triamcinolone cream 0.1 % Commonly known as:  KENALOG Apply 1 application topically 2 (two) times daily as needed (rash/itching).   TRUBIOTICS Caps Take 1 capsule by mouth daily.   umeclidinium bromide 62.5 MCG/INH  Aepb Commonly known as:  INCRUSE ELLIPTA Inhale 1 puff into the lungs daily.   venlafaxine 75 MG tablet Commonly known as:  EFFEXOR TAKE 1 TABLET BY MOUTH IN THE EVENING FOR 7 DAYS THEN INCREASE TO 2 BY MOUTH EVERY EVENING       Allergies  Allergen Reactions  . Nsaids Shortness Of Breath    Pt states he can take ibuprofen occasionally with no reaction  . Pregabalin Other (See Comments)    Fluid retention    Consultations:  cardiology   Procedures/Studies: Dg Chest 2 View  Result Date: 06/06/2017 CLINICAL DATA:  Intermittent chest pain. EXAM: CHEST - 2 VIEW COMPARISON:  Chest x-ray dated January 29, 2017. FINDINGS: Interval replacement of the right chest wall port catheter, with the tip in the proximal right atrium. The heart size and mediastinal contours are within normal limits. Normal pulmonary vascularity. Low lung volumes. No focal consolidation, pleural effusion, or pneumothorax. No acute osseous  abnormality. Postsurgical changes along the left fifth and sixth ribs, similar to prior study. IMPRESSION: No active cardiopulmonary disease. Electronically Signed   By: Titus Dubin M.D.   On: 06/06/2017 18:23   US Carotid Bilateral  Result Date: 06/07/2017 CLINICAL DATA:  Vertigo, pre syncopal episode, hypertension, hyperlipidemia EXAM: BILATERAL CAROTID DUPLEX ULTRASOUND TECHNIQUE: Pearline Cables scale imaging, color Doppler and duplex ultrasound were performed of bilateral carotid and vertebral arteries in the neck. COMPARISON:  None. FINDINGS: Criteria: Quantification of carotid stenosis is based on velocity parameters that correlate the residual internal carotid diameter with NASCET-based stenosis levels, using the diameter of the distal internal carotid lumen as the denominator for stenosis measurement. The following velocity measurements were obtained: RIGHT ICA:  84/27 cm/sec CCA:  26/37 cm/sec SYSTOLIC ICA/CCA RATIO:  1.3 DIASTOLIC ICA/CCA RATIO:  2.1 ECA:  67 cm/sec LEFT ICA:  56/18  cm/sec CCA:  85/88 cm/sec SYSTOLIC ICA/CCA RATIO:  0.7 DIASTOLIC ICA/CCA RATIO:  1.1 ECA:  71 cm/sec RIGHT CAROTID ARTERY: Minor echogenic shadowing plaque formation. No hemodynamically significant right ICA stenosis, velocity elevation, or turbulent flow. Degree of narrowing less than 50%. RIGHT VERTEBRAL ARTERY:  Antegrade LEFT CAROTID ARTERY: Similar scattered minor echogenic plaque formation. No hemodynamically significant left ICA stenosis, velocity elevation, or turbulent flow. LEFT VERTEBRAL ARTERY:  Antegrade IMPRESSION: Minor carotid atherosclerosis. No hemodynamically significant ICA stenosis. Degree of narrowing less than 50% bilaterally by ultrasound criteria. Patent antegrade vertebral flow bilaterally Electronically Signed   By: Jerilynn Mages.  Shick M.D.   On: 06/07/2017 13:03    Echo: Left ventricle: The cavity size was normal. Wall thickness was at   the upper limits of normal. The estimated ejection fraction was   55%. Wall motion was normal; there were no regional wall motion   abnormalities. Doppler parameters are consistent with abnormal   left ventricular relaxation (grade 1 diastolic dysfunction). - Aortic valve: Mildly calcified annulus. Trileaflet; mildly   calcified leaflets. - Mitral valve: There was trivial regurgitation. - Right atrium: Central venous pressure (est): 3 mm Hg. - Tricuspid valve: There was trivial regurgitation. - Pulmonary arteries: Systolic pressure could not be accurately   estimated. - Pericardium, extracardiac: A prominent pericardial fat pad was   present.   Subjective: No further shortness of breath or chest pain, no dizziness on standing  Discharge Exam: Vitals:   06/07/17 0632 06/07/17 1451  BP: 126/79 131/69  Pulse: 65 (!) 59  Resp:  18  Temp: 97.7 F (36.5 C) (!) 97.4 F (36.3 C)  SpO2: 97% 100%   Vitals:   06/06/17 2232 06/07/17 0232 06/07/17 0632 06/07/17 1451  BP: 130/89 132/80 126/79 131/69  Pulse: 84 71 65 (!) 59  Resp: 14   18   Temp: 98.2 F (36.8 C) 97.7 F (36.5 C) 97.7 F (36.5 C) (!) 97.4 F (36.3 C)  TempSrc: Oral Oral Oral Oral  SpO2: 100% 97% 97% 100%  Weight:      Height:        General: Pt is alert, awake, not in acute distress Cardiovascular: RRR, S1/S2 +, no rubs, no gallops Respiratory: CTA bilaterally, no wheezing, no rhonchi Abdominal: Soft, NT, ND, bowel sounds + Extremities: no edema, no cyanosis    The results of significant diagnostics from this hospitalization (including imaging, microbiology, ancillary and laboratory) are listed below for reference.     Microbiology: No results found for this or any previous visit (from the past 240 hour(s)).   Labs: BNP (last 3 results) Recent Labs  07/19/16 1126 11/09/16 1156 01/01/17 2342  BNP 25.0 35.1 412.8*   Basic Metabolic Panel: Recent Labs  Lab 06/06/17 1744 06/06/17 1847 06/06/17 1850  NA 137 140  --   K 4.0 4.0  --   CL 88* 89*  --   CO2  --  38*  --   GLUCOSE 120* 107*  --   BUN 30* 34*  --   CREATININE 1.30* 1.24  --   CALCIUM  --  9.8  --   MG  --   --  2.1   Liver Function Tests: Recent Labs  Lab 06/06/17 1847  AST 25  ALT 25  ALKPHOS 48  BILITOT 0.8  PROT 6.7  ALBUMIN 4.1   No results for input(s): LIPASE, AMYLASE in the last 168 hours. No results for input(s): AMMONIA in the last 168 hours. CBC: Recent Labs  Lab 06/06/17 1744 06/06/17 1847  WBC  --  12.3*  NEUTROABS  --  9.6*  HGB 11.9* 11.1*  HCT 35.0* 34.3*  MCV  --  106.5*  PLT  --  228   Cardiac Enzymes: Recent Labs  Lab 06/06/17 1848 06/07/17 0158 06/07/17 0643 06/07/17 1257  TROPONINI <0.03 0.03* 0.03* 0.03*   BNP: Invalid input(s): POCBNP CBG: Recent Labs  Lab 06/06/17 2231 06/07/17 0728 06/07/17 1117 06/07/17 1600  GLUCAP 82 85 82 75   D-Dimer No results for input(s): DDIMER in the last 72 hours. Hgb A1c Recent Labs    06/06/17 1850  HGBA1C 6.3*   Lipid Profile Recent Labs    06/07/17 0643  CHOL 191   HDL 59  LDLCALC 104*  TRIG 140  CHOLHDL 3.2   Thyroid function studies No results for input(s): TSH, T4TOTAL, T3FREE, THYROIDAB in the last 72 hours.  Invalid input(s): FREET3 Anemia work up No results for input(s): VITAMINB12, FOLATE, FERRITIN, TIBC, IRON, RETICCTPCT in the last 72 hours. Urinalysis    Component Value Date/Time   COLORURINE YELLOW 01/29/2017 1118   APPEARANCEUR CLEAR 01/29/2017 1118   APPEARANCEUR Clear 05/31/2016 1431   LABSPEC 1.008 01/29/2017 1118   PHURINE 7.0 01/29/2017 1118   GLUCOSEU 150 (A) 01/29/2017 1118   HGBUR NEGATIVE 01/29/2017 1118   BILIRUBINUR NEGATIVE 01/29/2017 1118   BILIRUBINUR Negative 05/31/2016 [place] 01/29/2017 1118   PROTEINUR 100 (A) 01/29/2017 1118   UROBILINOGEN 1.0 07/18/2009 1936   NITRITE NEGATIVE 01/29/2017 1118   LEUKOCYTESUR NEGATIVE 01/29/2017 1118   LEUKOCYTESUR Negative 05/31/2016 1431   Sepsis Labs Invalid input(s): PROCALCITONIN,  WBC,  LACTICIDVEN Microbiology No results found for this or any previous visit (from the past 240 hour(s)).   Time coordinating discharge: 11mins  SIGNED:   Kathie Dike, MD  [facility] 06/07/2017, 6:35 PM Pager   If 7PM-7AM, please contact night-coverage [email or web address] Password [email or web address]

## 2017-06-07 NOTE — Consult Note (Addendum)
Cardiology Consult    Patient ID: Derek Shepherd; [id number]; 1955-08-31   Admit date: 06/06/2017 Date of Consult: 06/07/2017  Primary Care Provider: Claretta Fraise, MD Primary Cardiologist: Evaluated by Dr. Martinique in 2016 - no Outpatient follow-up since   Patient Profile    Derek Shepherd is a 62 y.o. 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 Olevia Bowens.   History of Present Illness    [name] was evaluated by his PCP yesterday for episodes of dizziness and weakness occurring for the past few days. Reported presyncopal episodes but denied any actual syncope. Did describe a dull achy pain along his chest which had been persistent since earlier that morning. Due to his multiple symptoms, it was advised he proceed to the ED for further evaluation.  In talking with the patient today, he reports having episodes of dizziness and presyncope over the past several months. Had an actual syncopal event in 01/2017 which led to admission and was thought to be secondary to anemia as his hemoglobin was 5.8 and he required multiple transfusions and was transferred from [facility] to [facility]. He denies any repeat syncope since but does have dizziness and this is most notable when changing positions. He describes this as the room spinning. Denies any associated headaches, vision changes, or palpitations. Starting yesterday morning, he noted an aching sensation along his entire precordium which could last for a few seconds up to an hour and was not associated with exertion or positional changes. He has baseline dyspnea on exertion but denies any recent changes in this. No recent orthopnea, PND, or lower extremity  edema.  Initial labs showed WBC 12.3, Hgb 11.1, platelets 228, Na+ 140, K+ 4.0, and creatinine 1.24.  Cyclic troponin values have been flat at 0.03. CXR shows no active cardiopulmonary disease. EKG shows normal sinus rhythm, HR 96, with no acute ST or T wave changes when compared to prior tracings.  He denies any repeat episodes of chest pain this morning. Has maintained normal sinus rhythm on telemetry with no significant arrhythmias and repeat EKG this morning shows no acute ischemic changes.   Past Medical History:  Diagnosis Date  . Adenomatous colon polyp    tubular  . Anemia   . Anxiety   . Arthritis   . Asthma   . Bowel obstruction (HCC)    constipation  . CHF (congestive heart failure) ([place])   . Complication of anesthesia    ' I am not in good enough shape for surgery "  . COPD (chronic obstructive pulmonary disease) ([place])   . Depression   . Diabetes mellitus without complication ([place])    type 2   . Diverticulosis   . DVT (deep venous thrombosis) ([place])   . Gallstones   . GERD (gastroesophageal reflux disease)   . 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 History of bone marrow transplant ([place])   . Leukemia-lymphoma, T-cell, acute, HTLV-I-associated ([place])   . Personal history of colonic  adenoma 01/22/2008  . Syncope and collapse 01/29/2017    Past Surgical History:  Procedure Laterality Date  . BONE MARROW TRANSPLANT  2011  . CHOLECYSTECTOMY    .  COLONOSCOPY W/ BIOPSIES    . EXPLORATORY LAPAROTOMY     1986. Had tractor flipped over while he was operating it.   [facility] Kitchen HERNIA REPAIR     x 2  . LUNG SURGERY Left      Home Medications:  Prior to Admission medications   Medication Sig Start Date End Date Taking? Authorizing Provider  albuterol (PROAIR HFA) 108 (90 Base) MCG/ACT inhaler Inhale 2 puffs into the lungs daily.   Yes [provider]  apixaban (ELIQUIS) 5 MG TABS tablet Take 5 mg by mouth 2 (two) times daily.  06/05/17  Yes [provider]  clonazePAM (KLONOPIN) 0.5 MG tablet Take 1 mg by mouth at bedtime.    Yes [provider]  folic acid (FOLVITE) 1 MG tablet TAKE 1 TABLET BY MOUTH ONCE DAILY Patient taking differently: TAKE 1 TABLET  (1 MG )BY MOUTH ONCE DAILY 12/16/16  Yes Stacks, Cletus Gash, MD  HYDROmorphone (DILAUDID) 4 MG tablet Take 4 mg by mouth 5 (five) times daily.   Yes [provider]  lactulose (CHRONULAC) 10 GM/15ML solution Take by mouth daily as needed (constipation).    Yes [provider]  levalbuterol (XOPENEX) 1.25 MG/3ML nebulizer solution USE ONE VIAL IN NEBULIZER 4 TIMES DAILY Patient taking differently: USE ONE VIAL (0.125 MG)  IN NEBULIZER 2 TIMES DAILY 08/19/16  Yes Claretta Fraise, MD  metFORMIN (GLUCOPHAGE) 500 MG tablet Take 1 tablet (500 mg total) by mouth 2 (two) times daily with a meal. 03/28/17  Yes Claretta Fraise, MD  metoprolol tartrate (LOPRESSOR) 25 MG tablet Take 12.5 mg by mouth 2 (two) times daily.   Yes [provider]  montelukast (SINGULAIR) 10 MG tablet Take 10 mg by mouth daily.   Yes [provider]  Multiple Vitamin (MULTIVITAMIN WITH MINERALS) TABS tablet Take 1 tablet by mouth daily.   Yes [provider]  nefazodone (SERZONE) 50 MG tablet Take 50 mg by mouth at bedtime. 01/04/17  Yes [provider]  oxymetazoline (AFRIN) 0.05 % nasal spray Place 1 spray into both nostrils daily as needed (nose bleeds).   Yes [provider]  pantoprazole (PROTONIX) 40 MG tablet Take 1 tablet (40 mg total) by mouth at bedtime. 03/28/17  Yes Stacks, Cletus Gash, MD  Polyethyl Glycol-Propyl Glycol (SYSTANE) 0.4-0.3 % SOLN Place 1 drop into both eyes 5 (five) times daily as needed (for dry eyes).    Yes [provider]  polyethylene glycol (MIRALAX / GLYCOLAX) packet Take 1 packet by mouth daily. 06/01/17  Yes [provider]  potassium chloride (MICRO-K) 10 MEQ CR capsule TAKE THREE  CAPSULES BY MOUTH TWICE DAILY 03/28/17  Yes Claretta Fraise, MD  Probiotic Product (TRUBIOTICS) CAPS Take 1 capsule by mouth daily.   Yes [provider]  rOPINIRole (REQUIP) 3 MG tablet TAKE 1 TABLET BY MOUTH ONCE DAILY AT BEDTIME 04/27/17  Yes Claretta Fraise, MD  sulfamethoxazole-trimethoprim (BACTRIM DS,SEPTRA DS) 800-160 MG tablet Take 1 tablet by mouth every Monday, Wednesday, and Friday.   Yes [provider]  SYMBICORT 160-4.5 MCG/ACT inhaler INHALE TWO PUFFS BY MOUTH ONCE DAILY IN THE MORNING Patient taking differently: INHALE TWO PUFFS BY MOUTH TWICE DAILY 08/19/16  Yes Claretta Fraise, MD  tiotropium (SPIRIVA HANDIHALER) 18 MCG inhalation capsule Place 1 capsule (18 mcg total) daily into inhaler and inhale. 01/17/17  Yes Stacks, Cletus Gash, MD  torsemide (DEMADEX) 100 MG tablet TAKE TWO TABLETS BY MOUTH ONCE DAILY Patient taking differently: TAKE 100MG  BY MOUTH ONCE DAILY 02/23/17  Yes Claretta Fraise, MD  triamcinolone cream (KENALOG) 0.1 % Apply 1 application topically 2 (two) times daily as needed (rash/itching).   Yes [provider]  umeclidinium bromide (INCRUSE ELLIPTA) 62.5 MCG/INH AEPB Inhale 1 puff into the lungs daily. 06/07/16  Yes Stacks, Cletus Gash, MD  venlafaxine (EFFEXOR) 75 MG tablet TAKE 1 TABLET BY MOUTH IN THE EVENING FOR 7 DAYS THEN INCREASE TO 2 BY MOUTH EVERY EVENING 06/05/17  Yes Claretta Fraise, MD  glucose blood test strip Use as instructed 05/02/17   Claretta Fraise, MD    Inpatient Medications: Scheduled Meds: . albuterol  3 mL Inhalation Daily  . apixaban  5 mg Oral BID  . clonazePAM  1 mg Oral QHS  . folic acid  1 mg Oral Daily  . mouth rinse  15 mL Mouth Rinse BID  . metFORMIN  500 mg Oral BID WC  . metoprolol tartrate  12.5 mg Oral BID  . mometasone-formoterol  2 puff Inhalation BID  . montelukast  10 mg Oral Daily  . multivitamin with minerals  1 tablet Oral Daily  . nefazodone  50 mg Oral QHS  . pantoprazole  40 mg Oral QHS  .  polyethylene glycol  1 packet Oral Daily  . potassium chloride  30 mEq Oral BID  . rOPINIRole  3 mg Oral QHS  . sulfamethoxazole-trimethoprim  1 tablet Oral Q M,W,F  . tiotropium  18 mcg Inhalation Daily  . torsemide  100 mg Oral Daily  . umeclidinium bromide  1 puff Inhalation Daily  . venlafaxine  75 mg Oral Daily   Continuous Infusions:  PRN Meds: acetaminophen **OR** acetaminophen, HYDROmorphone, lactulose, levalbuterol, morphine injection, nitroGLYCERIN, ondansetron **OR** ondansetron (ZOFRAN) IV, oxymetazoline, polyvinyl alcohol, triamcinolone cream  Allergies:    Allergies  Allergen Reactions  . Nsaids Shortness Of Breath    Pt states he can take ibuprofen occasionally with no reaction  . Pregabalin Other (See Comments)    Fluid retention    Social History:   Social History   Socioeconomic History  . Marital status: Married    Spouse name: Not on file  . Number of children: 2  . Years of education: Not on file  . Highest education level: Not on file  Occupational History  . Occupation: disabled  Social Needs  . Financial resource strain: Not on file  . Food insecurity:    Worry: Not on file    Inability: Not on file  . Transportation needs:    Medical: Not on file    Non-medical: Not on file  Tobacco Use  . Smoking status: Former Smoker    Types: Cigarettes    Last attempt to quit: 07/13/1993    Years since quitting: 23.9  . Smokeless tobacco: Never Used  Substance and Sexual Activity  . Alcohol use: Yes    Alcohol/week: 0.0 oz    Comment: socially  . Drug use: No  . Sexual activity: Not on file  Lifestyle  . Physical activity:    Days per week: Not on file    Minutes per session: Not on file  . Stress: Not on file  Relationships  . Social connections:    Talks on phone: Not on file    Gets together: Not on file    Attends religious service: Not on file    Active member of club or organization: Not on file    Attends meetings of clubs or  organizations: Not on file    Relationship status: Not on  file  . Intimate partner violence:    Fear of current or ex partner: Not on file    Emotionally abused: Not on file    Physically abused: Not on file    Forced sexual activity: Not on file  Other Topics Concern  . Not on file  Social History Narrative  . Not on file     Family History:    Family History  Problem Relation Age of Onset  . Clotting disorder Mother   . Heart attack Mother   . Diabetes Father   . Heart attack Father   . Prostate cancer Brother   . Diabetes Brother        x 3  . Diabetes Sister        x 3  . Diabetes Maternal Aunt       Review of Systems    General:  No chills, fever, night sweats or weight changes.  Cardiovascular:  No edema, orthopnea, palpitations, paroxysmal nocturnal dyspnea. Positive for chest pain, dyspnea on exertion and presyncope.  Dermatological: No rash, lesions/masses Respiratory: No cough, dyspnea Urologic: No hematuria, dysuria Abdominal:   No nausea, vomiting, diarrhea, bright red blood per rectum, melena, or hematemesis Neurologic:  No visual changes, wkns, changes in mental status. All other systems reviewed and are otherwise negative except as noted above.  Physical Exam/Data    Vitals:   06/06/17 2200 06/06/17 2232 06/07/17 0232 06/07/17 0632  BP:  130/89 132/80 126/79  Pulse:  84 71 65  Resp:  14    Temp:  98.2 F (36.8 C) 97.7 F (36.5 C) 97.7 F (36.5 C)  TempSrc:  Oral Oral Oral  SpO2:  100% 97% 97%  Weight: 194 lb 6.4 oz (88.2 kg)     Height: 5\' 6"  (1.676 m)      No intake or output data in the 24 hours ending 06/07/17 0820 Filed Weights   06/06/17 1707 06/06/17 2200  Weight: 203 lb (92.1 kg) 194 lb 6.4 oz (88.2 kg)   Body mass index is 31.38 kg/m.   General: Pleasant, Caucasian male appearing in NAD Psych: Normal affect. Neuro: Alert and oriented X 3. Moves all extremities spontaneously. HEENT: Normal  Neck: Supple without bruits or  JVD. Lungs:  Resp regular and unlabored, CTA without wheezing or rales. Heart: RRR no s3, s4, or murmurs. Abdomen: Soft, non-tender, non-distended, BS + x 4.  Extremities: No clubbing, cyanosis or edema. DP/PT/Radials 2+ and equal bilaterally. Hyperpigmentation along upper extremities.    EKG:  The EKG was personally reviewed and demonstrates: NSR, HR 96, with no acute ST or T wave changes when compared to prior tracings.  Telemetry:  Telemetry was personally reviewed and demonstrates:  NSR, HR in 60's to 70's with occasional PVC's.    Labs/Studies     Relevant CV Studies:  Echocardiogram: 01/30/2017 Study Conclusions  - Left ventricle: The cavity size was normal. There was mild   concentric hypertrophy. Systolic function was normal. The   estimated ejection fraction was in the range of 60% to 65%. Wall   motion was normal; there were no regional wall motion   abnormalities. Doppler parameters are consistent with abnormal   left ventricular relaxation (grade 1 diastolic dysfunction). - Aortic valve: There was no regurgitation. - Mitral valve: Transvalvular velocity was within the normal range.   There was no evidence for stenosis. There was no regurgitation. - Right ventricle: Systolic function was normal.  Laboratory Data:  Chemistry Recent Labs  Lab 06/06/17  1744 06/06/17 1847  NA 137 140  K 4.0 4.0  CL 88* 89*  CO2  --  38*  GLUCOSE 120* 107*  BUN 30* 34*  CREATININE 1.30* 1.24  CALCIUM  --  9.8  GFRNONAA  --  >60  GFRAA  --  >60  ANIONGAP  --  13    Recent Labs  Lab 06/06/17 1847  PROT 6.7  ALBUMIN 4.1  AST 25  ALT 25  ALKPHOS 48  BILITOT 0.8   Hematology Recent Labs  Lab 06/06/17 1744 06/06/17 1847  WBC  --  12.3*  RBC  --  3.22*  HGB 11.9* 11.1*  HCT 35.0* 34.3*  MCV  --  106.5*  MCH  --  34.5*  MCHC  --  32.4  RDW  --  15.2  PLT  --  228   Cardiac Enzymes Recent Labs  Lab 06/06/17 1848 06/07/17 0158 06/07/17 0643  TROPONINI  <0.03 0.03* 0.03*    Recent Labs  Lab 06/06/17 1744  TROPIPOC 0.01    BNPNo results for input(s): BNP, PROBNP in the last 168 hours.  DDimer No results for input(s): DDIMER in the last 168 hours.  Radiology/Studies:  Dg Chest 2 View  Result Date: 06/06/2017 CLINICAL DATA:  Intermittent chest pain. EXAM: CHEST - 2 VIEW COMPARISON:  Chest x-ray dated January 29, 2017. FINDINGS: Interval replacement of the right chest wall port catheter, with the tip in the proximal right atrium. The heart size and mediastinal contours are within normal limits. Normal pulmonary vascularity. Low lung volumes. No focal consolidation, pleural effusion, or pneumothorax. No acute osseous abnormality. Postsurgical changes along the left fifth and sixth ribs, similar to prior study. IMPRESSION: No active cardiopulmonary disease. Electronically Signed   By: Titus Dubin M.D.   On: 06/06/2017 18:23     Assessment & Plan    1. Atypical Chest Pain - He presented with episodes of chest pain occurring throughout 4/2 which would vary in duration from a few seconds to several hours. No association with exertion or positional changes. - Cyclic troponin values have been flat at 0.03 and his EKG shows no acute ischemic changes. His overall symptoms seem atypical for a cardiac etiology but he does have multiple risk factors including HTN, HLD, Type II DM, and family history of CAD. A repeat echocardiogram has been ordered but this was obtained within the past 6 months. Will update the order to a limited study to just assess EF and wall motion. If no acute abnormalities are identified, can consider outpatient stress testing once his current condition improves.  2. Presyncope - He has experienced two syncopal episodes within the past year and one of these occurred in the setting of significant anemia with hemoglobin of 5.8 at that time. He has continued to experience dizziness which is exacerbated when changing positions and this  seems most consistent with an orthostatic etiology.  - He is on high-dose Torsemide (previously 100mg  BID but now 100mg  daily by review of records). Will ask for orthostatic vitals to be checked later this morning. Carotid Dopplers are also pending.   3. Chronic Diastolic CHF - The echo report in 2016 lists his EF at 30-35% but by review of cardiology progress notes at that time, his EF was thought to have been at 50%. Repeat echo in 01/2017 showed his EF was 60-65% with grade 1 DD noted. - He denies any recent orthopnea, PND, or lower extremity edema. Appears euvolemic on examination today. - continue  PTA Lopressor and Torsemide. May need to adjust diuretic dosing pending orthostatic vitals.  4. Chronic Hypoxic Respiratory Failure/COPD - on 2L  at baseline. Oxygen saturations have remained appropriate.   5. 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 - Hgb stable at 11.1 with platelets at 228. Followed by Hem[facility]Onc at [facility].   6. History of PE/DVT - remains on Eliquis for anticoagulation.     For questions or updates, please contact [place] Please consult [email or web address] for contact info under Cardiology/STEMI.  Signed, Erma Heritage, PA-C 06/07/2017, 8:20 AM Pager: [phone]   Attending note:  Patient seen and examined.  Reviewed extensive records and updated the chart.  Also discussed the case with Ms. Ahmed Prima PA-C.  Mr. 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  He also has chronic hypoxic respiratory failure due to COPD, a questionable history of cardiomyopathy with LVEF 60-65% by echocardiogram in November of last year, incidentally noted coronary artery calcifications by chest CT imaging in November of last year.  No reported history of obstructive CAD or myocardial  infarction.  He is currently admitted to the hospital with recent presyncopal episode, general feeling of weakness and "jitteriness," also atypical chest discomfort with episodes lasting anywhere from a few seconds up to a few hours.  No obvious precipitant identified.  He has not had any palpitations, no cough or hemoptysis, no recent worsening in wheezing, no fevers or chills.  On examination this morning he states that he feels "jittery" and weak, no active chest pain.  Systolic blood pressure has been in the 110-130 range with orthostatics pending.  Lungs exhibit diminished breath sounds with prolonged expiratory phase and a few crackles at the bases but no wheezing.  Cardiac exam reveals RRR without gallop.  He has venous stasis changes in his legs.  I reviewed his telemetry which shows sinus rhythm, no pauses or sustained arrhythmias.  Lab work shows potassium 4.0, creatinine 1.24, troponin I 0.03 and 0.03, LDL 104, W BC 12.3, hemoglobin 11.1, platelets 228.  I reviewed his ECG which shows sinus rhythm with increased voltage, high lateral Q waves, repolarization abnormalities.  Chest x-ray reports no active cardiopulmonary disease.  Patient presents with recent atypical chest discomfort and presyncope.  Etiology is not certain at this time.  He has had no arrhythmias documented by telemetry, orthostatic measurements are pending.  Cardiac enzymes trend is not consistent with ACS.  He does have coronary artery calcification by prior chest CT imaging in November 2018.  At this point recommend follow-up echocardiogram to reassess LVEF and evaluate for wall motion abnormalities.  Follow-up on orthostatics.  Symptoms do not sound to be arrhythmogenic in etiology.  If workup is reassuring from a cardiac perspective, further evaluation as an outpatient with a Myoview study could ultimately be considered for clarification of significance of coronary artery calcification by CT imaging.  Satira Sark, M.D.,  F.A.C.C.

## 2017-06-07 NOTE — Progress Notes (Signed)
   Found to be significantly orthostatic this morning with vitals as outlined below:  Orthostatic VS for the past 24 hrs (Last 3 readings):  BP- Lying Pulse- Lying BP- Sitting Pulse- Sitting BP- Standing at 0 minutes Pulse- Standing at 0 minutes BP- Standing at 3 minutes Pulse- Standing at 3 minutes  06/07/17 0803 140/75 67 116/71 73 (!) 84/64 86 120/74 79    Will stop Torsemide at this time as he appears euvolemic on examination. Continue to follow volume status. He was on 100mg  daily PTA. May need to consider resuming at a lower dose.   Signed, Erma Heritage, PA-C 06/07/2017, 10:44 AM

## 2017-06-10 ENCOUNTER — Other Ambulatory Visit: Payer: Self-pay | Admitting: Family Medicine

## 2017-06-10 DIAGNOSIS — R69 Illness, unspecified: Secondary | ICD-10-CM | POA: Diagnosis not present

## 2017-06-12 ENCOUNTER — Other Ambulatory Visit: Payer: Self-pay | Admitting: Family Medicine

## 2017-06-14 DIAGNOSIS — F4323 Adjustment disorder with mixed anxiety and depressed mood: Secondary | ICD-10-CM | POA: Diagnosis not present

## 2017-06-14 DIAGNOSIS — R69 Illness, unspecified: Secondary | ICD-10-CM | POA: Diagnosis not present

## 2017-06-14 DIAGNOSIS — Z7682 Awaiting organ transplant status: Secondary | ICD-10-CM | POA: Diagnosis not present

## 2017-06-15 DIAGNOSIS — R0602 Shortness of breath: Secondary | ICD-10-CM | POA: Diagnosis not present

## 2017-06-15 DIAGNOSIS — Z86711 Personal history of pulmonary embolism: Secondary | ICD-10-CM | POA: Diagnosis not present

## 2017-06-15 DIAGNOSIS — Z01818 Encounter for other preprocedural examination: Secondary | ICD-10-CM | POA: Diagnosis not present

## 2017-06-15 DIAGNOSIS — R918 Other nonspecific abnormal finding of lung field: Secondary | ICD-10-CM | POA: Diagnosis not present

## 2017-06-15 DIAGNOSIS — I10 Essential (primary) hypertension: Secondary | ICD-10-CM | POA: Diagnosis not present

## 2017-06-15 DIAGNOSIS — Z7682 Awaiting organ transplant status: Secondary | ICD-10-CM | POA: Diagnosis not present

## 2017-06-15 DIAGNOSIS — N182 Chronic kidney disease, stage 2 (mild): Secondary | ICD-10-CM | POA: Diagnosis not present

## 2017-06-15 DIAGNOSIS — E0865 Diabetes mellitus due to underlying condition with hyperglycemia: Secondary | ICD-10-CM | POA: Diagnosis not present

## 2017-06-15 DIAGNOSIS — I130 Hypertensive heart and chronic kidney disease with heart failure and stage 1 through stage 4 chronic kidney disease, or unspecified chronic kidney disease: Secondary | ICD-10-CM | POA: Diagnosis not present

## 2017-06-15 DIAGNOSIS — E669 Obesity, unspecified: Secondary | ICD-10-CM | POA: Diagnosis not present

## 2017-06-15 DIAGNOSIS — D89813 Graft-versus-host disease, unspecified: Secondary | ICD-10-CM | POA: Diagnosis not present

## 2017-06-16 ENCOUNTER — Ambulatory Visit (INDEPENDENT_AMBULATORY_CARE_PROVIDER_SITE_OTHER): Payer: Medicare HMO

## 2017-06-16 ENCOUNTER — Ambulatory Visit (INDEPENDENT_AMBULATORY_CARE_PROVIDER_SITE_OTHER): Payer: Medicare HMO | Admitting: Family Medicine

## 2017-06-16 ENCOUNTER — Encounter: Payer: Self-pay | Admitting: Family Medicine

## 2017-06-16 VITALS — BP 136/84 | HR 114 | Temp 97.1°F | Ht 66.0 in | Wt 204.0 lb

## 2017-06-16 DIAGNOSIS — M25522 Pain in left elbow: Secondary | ICD-10-CM

## 2017-06-16 DIAGNOSIS — S79912A Unspecified injury of left hip, initial encounter: Secondary | ICD-10-CM | POA: Diagnosis not present

## 2017-06-16 DIAGNOSIS — B370 Candidal stomatitis: Secondary | ICD-10-CM

## 2017-06-16 DIAGNOSIS — S59902A Unspecified injury of left elbow, initial encounter: Secondary | ICD-10-CM | POA: Diagnosis not present

## 2017-06-16 DIAGNOSIS — M25552 Pain in left hip: Secondary | ICD-10-CM | POA: Diagnosis not present

## 2017-06-16 MED ORDER — FLUCONAZOLE 150 MG PO TABS: 150.0000 mg | ORAL_TABLET | Freq: Once | ORAL | 0 refills | Status: AC

## 2017-06-16 NOTE — Progress Notes (Signed)
BP 136/84   Pulse (!) 114   Temp (!) 97.1 F (36.2 C) (Oral)   Ht 5\' 6"  (1.676 m)   Wt 204 lb (92.5 kg)   BMI 32.93 kg/m    Subjective:    Patient ID: Derek Shepherd, male    DOB: [birth year], 62 y.o.   MRN: [id number]  HPI: Derek Shepherd is a 62 y.o. male presenting on 06/16/2017 for Fall (2 days ago, hit head, left elbow, buttocks; no LOC, headaches, visual changes)   HPI Patient had a fall in the shower where he slipped and hit his head his left elbow on his buttocks and is having a lot of pain with those.  He did not lose any consciousness and his visual changes were happening before from allergies but not from since this happened 2 days ago.  He is having bruising on his head and his hip and his elbow.  He is on Eliquis and that is why he thinks he is having all of the bruising.  He denies any visual disturbances more than what he has been having.  He denies any numbness or weakness.  He denies any difficulty with range of motion or ambulation.  Mainly it hurts over the bruises when you palpate them.  Relevant past medical, surgical, family and social history reviewed and updated as indicated. Interim medical history since our last visit reviewed. Allergies and medications reviewed and updated.  Review of Systems  Constitutional: Negative for chills and fever.  Respiratory: Negative for shortness of breath and wheezing.   Cardiovascular: Negative for chest pain and leg swelling.  Musculoskeletal: Positive for myalgias. Negative for back pain and gait problem.  Skin: Positive for color change. Negative for rash.  All other systems reviewed and are negative.   Per HPI unless specifically indicated above   Allergies as of 06/16/2017      Reactions   Nsaids Shortness Of Breath   Pt states he can take ibuprofen occasionally with no reaction   Pregabalin Other (See Comments)   Fluid retention      Medication List        Accurate as of 06/16/17  2:41 PM. Always use your  most recent med list.          apixaban 5 MG Tabs tablet Commonly known as:  ELIQUIS Take 5 mg by mouth 2 (two) times daily.   clonazePAM 0.5 MG tablet Commonly known as:  KLONOPIN Take 1 mg by mouth at bedtime.   fluconazole 150 MG tablet Commonly known as:  DIFLUCAN Take 1 tablet (150 mg total) by mouth once for 1 dose.   folic acid 1 MG tablet Commonly known as:  FOLVITE TAKE 1 TABLET BY MOUTH ONCE DAILY   glucose blood test strip Use as instructed   HYDROmorphone 4 MG tablet Commonly known as:  DILAUDID Take 4 mg by mouth 5 (five) times daily.   INCRUSE ELLIPTA 62.5 MCG/INH Aepb Generic drug:  umeclidinium bromide INHALE 1 PUFF BY MOUTH ONCE DAILY   lactulose 10 GM/15ML solution Commonly known as:  CHRONULAC Take by mouth daily as needed (constipation).   levalbuterol 1.25 MG/3ML nebulizer solution Commonly known as:  XOPENEX USE ONE VIAL IN NEBULIZER 4 TIMES DAILY   metFORMIN 500 MG tablet Commonly known as:  GLUCOPHAGE Take 1 tablet (500 mg total) by mouth 2 (two) times daily with a meal.   metoprolol tartrate 25 MG tablet Commonly known as:  LOPRESSOR Take 12.5 mg by mouth  2 (two) times daily.   montelukast 10 MG tablet Commonly known as:  SINGULAIR Take 10 mg by mouth daily.   multivitamin with minerals Tabs tablet Take 1 tablet by mouth daily.   nefazodone 50 MG tablet Commonly known as:  SERZONE Take 50 mg by mouth at bedtime.   oxymetazoline 0.05 % nasal spray Commonly known as:  AFRIN Place 1 spray into both nostrils daily as needed (nose bleeds).   pantoprazole 40 MG tablet Commonly known as:  PROTONIX Take 1 tablet (40 mg total) by mouth at bedtime.   polyethylene glycol packet Commonly known as:  MIRALAX / GLYCOLAX Take 1 packet by mouth daily.   potassium chloride 10 MEQ CR capsule Commonly known as:  MICRO-K TAKE THREE CAPSULES BY MOUTH TWICE DAILY   PROAIR HFA 108 (90 Base) MCG/ACT inhaler Generic drug:  albuterol Inhale 2  puffs into the lungs daily.   rOPINIRole 3 MG tablet Commonly known as:  REQUIP TAKE 1 TABLET BY MOUTH ONCE DAILY AT BEDTIME   sulfamethoxazole-trimethoprim 800-160 MG tablet Commonly known as:  BACTRIM DS,SEPTRA DS Take 1 tablet by mouth every Monday, Wednesday, and Friday.   SYMBICORT 160-4.5 MCG/ACT inhaler Generic drug:  budesonide-formoterol INHALE TWO PUFFS BY MOUTH ONCE DAILY IN THE MORNING   SYSTANE 0.4-0.3 % Soln Generic drug:  Polyethyl Glycol-Propyl Glycol Place 1 drop into both eyes 5 (five) times daily as needed (for dry eyes).   tiotropium 18 MCG inhalation capsule Commonly known as:  SPIRIVA HANDIHALER Place 1 capsule (18 mcg total) daily into inhaler and inhale.   torsemide 20 MG tablet Commonly known as:  DEMADEX Take 4 tablets (80 mg total) by mouth daily.   triamcinolone cream 0.1 % Commonly known as:  KENALOG Apply 1 application topically 2 (two) times daily as needed (rash/itching).   TRUBIOTICS Caps Take 1 capsule by mouth daily.   venlafaxine 75 MG tablet Commonly known as:  EFFEXOR TAKE 1 TABLET BY MOUTH IN THE EVENING FOR 7 DAYS THEN INCREASE TO 2 BY MOUTH EVERY EVENING          Objective:    BP 136/84   Pulse (!) 114   Temp (!) 97.1 F (36.2 C) (Oral)   Ht 5\' 6"  (1.676 m)   Wt 204 lb (92.5 kg)   BMI 32.93 kg/m   Wt Readings from Last 3 Encounters:  06/16/17 204 lb (92.5 kg)  06/06/17 194 lb 6.4 oz (88.2 kg)  06/06/17 203 lb (92.1 kg)    Physical Exam  Constitutional: He is oriented to person, place, and time. He appears well-developed and well-nourished. No distress.  HENT:  Patient has signs of thrush, likely from his inhalers.  Eyes: Pupils are equal, round, and reactive to light. Conjunctivae and EOM are normal. Right eye exhibits no discharge. No scleral icterus.  Neck: Neck supple. No thyromegaly present.  Cardiovascular: Normal rate, regular rhythm, normal heart sounds and intact distal pulses.  No murmur  heard. Pulmonary/Chest: Effort normal and breath sounds normal. No respiratory distress. He has no wheezes.  Musculoskeletal: Normal range of motion. He exhibits no edema or tenderness (Palpable pain over bruise but no pain in joint or on head or with hip range of motion.).  Lymphadenopathy:    He has no cervical adenopathy.  Neurological: He is alert and oriented to person, place, and time. Coordination normal.  Skin: Skin is warm and dry. Bruising (Patient has a large 8 x 8 cm bruise on his left posterior buttock and  smaller bruise on posterior lateral left elbow and a small bruise on left parietal scalp) noted. No rash noted. He is not diaphoretic. No erythema.  Psychiatric: He has a normal mood and affect. His behavior is normal.  Nursing note and vitals reviewed.   Left elbow x-ray no signs of acute bony abnormality, await final read from radiologist Left hip x-ray no signs of acute bony abnormality, await final read from radiologist     Assessment & Plan:   Problem List Items Addressed This Visit    None    Visit Diagnoses    Left elbow pain    -  Primary   Likely contusion, watch the bruising and make sure it does not continue to expand, x-ray is normal   Relevant Orders   DG Elbow 2 Views Left   Left hip pain       Relevant Orders   DG HIP UNILAT W OR W/O PELVIS 2-3 VIEWS LEFT   Thrush       Relevant Medications   fluconazole (DIFLUCAN) 150 MG tablet       Follow up plan: Return if symptoms worsen or fail to improve.  Counseling provided for all of the vaccine components Orders Placed This Encounter  Procedures  . DG Elbow 2 Views Left  . DG HIP UNILAT W OR W/O PELVIS 2-3 VIEWS LEFT    Caryl Pina, MD [facility] 06/16/2017, 2:41 PM

## 2017-06-19 ENCOUNTER — Other Ambulatory Visit: Payer: Self-pay | Admitting: Family Medicine

## 2017-06-21 DIAGNOSIS — D89811 Chronic graft-versus-host disease: Secondary | ICD-10-CM | POA: Diagnosis not present

## 2017-06-21 DIAGNOSIS — R0689 Other abnormalities of breathing: Secondary | ICD-10-CM | POA: Diagnosis not present

## 2017-06-21 DIAGNOSIS — E669 Obesity, unspecified: Secondary | ICD-10-CM | POA: Diagnosis not present

## 2017-06-21 DIAGNOSIS — G4733 Obstructive sleep apnea (adult) (pediatric): Secondary | ICD-10-CM | POA: Diagnosis not present

## 2017-06-21 DIAGNOSIS — J42 Unspecified chronic bronchitis: Secondary | ICD-10-CM | POA: Diagnosis not present

## 2017-06-21 DIAGNOSIS — G4736 Sleep related hypoventilation in conditions classified elsewhere: Secondary | ICD-10-CM | POA: Diagnosis not present

## 2017-06-22 NOTE — Progress Notes (Signed)
Cardiology Office Note    Date:  06/23/2017   ID:  Derek Shepherd, DOB 1955/05/30, MRN [id number]  PCP:  Dettinger, Fransisca Kaufmann, MD  Cardiologist: Evaluated by Dr. Martinique during admission in 2016; Dr. Domenic Polite during admission in 06/2017  Chief Complaint  Patient presents with  . Hospitalization Follow-up    History of Present Illness:    Derek Shepherd is a 62 y.o. 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   He was recently admitted to [facility] earlier this month for evaluation of dizziness and presyncope.  Reported feeling like the room was spinning and this was most notable when changing positions.  He did report having an aching sensation along his entire precordium which would last for a few seconds and then spontaneously resolved. EKG showed no acute ischemic changes and cycle troponin values remained flat at 0.03. An echocardiogram was obtained which showed a preserved EF of 55% with no regional wall motion abnormalities. Orthostatic vitals were obtained and he was found to be significantly orthostatic with BP dropping from 140/75 to 84/64 with standing. He had been on Torsemide 100 mg daily prior to admission and this was resumed at 80 mg daily at the time of discharge.  With the patient today, he denies any repeat episodes of presyncope similar to when he required recent admission. He does note occasional lightheadedness which is present when changing positions. He has baseline dyspnea on exertion and is being followed at [facility] for a possible lung transplant in the future. He denies any recent orthopnea, PND, lower extremity edema, chest pain, or palpitations.  He does not weigh himself daily but says this is  overall been stable on his home scales. He reports compliance with his medication regimen including Eliquis for anticoagulation.   Past Medical History:  Diagnosis Date  . Adenomatous colon polyp    Tubular adenoma  . Anemia   . Anxiety   . Arthritis   . Asthma   . Constipation   . COPD (chronic obstructive pulmonary disease) ([place])   . Depression   . Diverticulosis   . Gallstones   . GERD (gastroesophageal reflux disease)   . 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 History of bone marrow transplant ([place])   . History of pulmonary embolus (PE)    Recurrent  . Leukemia-lymphoma, T-cell, acute, HTLV-I-associated ([place])   . Type 2 diabetes mellitus ([place])     Past Surgical History:  Procedure Laterality Date  . BONE MARROW TRANSPLANT  2011  . CHOLECYSTECTOMY    . COLONOSCOPY W/ BIOPSIES    . EXPLORATORY LAPAROTOMY     1986. Had tractor flipped over while he was operating it.   Marland Kitchen HERNIA REPAIR     x 2  . LUNG SURGERY Left     Current Medications: Outpatient Medications Prior to Visit  Medication Sig Dispense Refill  . albuterol (PROAIR HFA) 108 (90 Base) MCG/ACT inhaler Inhale 2 puffs into the lungs daily.    . citalopram (CELEXA) 40 MG tablet TAKE 1 TABLET BY MOUTH ONCE DAILY. REDUCE TO ONE-HALF TABLET ONCE DAILY WHILE TAKING FLUCONAZOLE FOR 14 DAYS 90 tablet 0  . clonazePAM (KLONOPIN) 0.5 MG tablet Take 1 mg by  mouth at bedtime.     . folic acid (FOLVITE) 1 MG tablet TAKE 1 TABLET BY MOUTH ONCE DAILY (Patient taking differently: TAKE 1 TABLET  (1 MG )BY MOUTH ONCE DAILY) 90 tablet 1  . glucose blood test strip Use as instructed 100 each 12  . HYDROmorphone (DILAUDID) 4 MG tablet Take 4 mg by mouth 5 (five) times daily.    . INCRUSE ELLIPTA 62.5 MCG/INH AEPB INHALE 1 PUFF BY MOUTH ONCE DAILY 30 each 5  . lactulose (CHRONULAC) 10 GM/15ML solution Take by mouth daily as needed (constipation).     Marland Kitchen levalbuterol (XOPENEX) 1.25 MG/3ML  nebulizer solution USE ONE VIAL IN NEBULIZER 4 TIMES DAILY (Patient taking differently: USE ONE VIAL (0.125 MG)  IN NEBULIZER 2 TIMES DAILY) 144 mL 2  . metFORMIN (GLUCOPHAGE) 500 MG tablet Take 1 tablet (500 mg total) by mouth 2 (two) times daily with a meal. 180 tablet 1  . metoprolol tartrate (LOPRESSOR) 25 MG tablet Take 12.5 mg by mouth 2 (two) times daily.    . montelukast (SINGULAIR) 10 MG tablet Take 10 mg by mouth daily.    . montelukast (SINGULAIR) 10 MG tablet TAKE 1 TABLET BY MOUTH ONCE DAILY 90 tablet 1  . Multiple Vitamin (MULTIVITAMIN WITH MINERALS) TABS tablet Take 1 tablet by mouth daily.    . nefazodone (SERZONE) 50 MG tablet Take 50 mg by mouth at bedtime.    Marland Kitchen oxymetazoline (AFRIN) 0.05 % nasal spray Place 1 spray into both nostrils daily as needed (nose bleeds).    . pantoprazole (PROTONIX) 40 MG tablet Take 1 tablet (40 mg total) by mouth at bedtime. 90 tablet 1  . Polyethyl Glycol-Propyl Glycol (SYSTANE) 0.4-0.3 % SOLN Place 1 drop into both eyes 5 (five) times daily as needed (for dry eyes).     . polyethylene glycol (MIRALAX / GLYCOLAX) packet Take 1 packet by mouth daily.  11  . potassium chloride (MICRO-K) 10 MEQ CR capsule TAKE THREE CAPSULES BY MOUTH TWICE DAILY 180 capsule 5  . Probiotic Product (TRUBIOTICS) CAPS Take 1 capsule by mouth daily.    Marland Kitchen rOPINIRole (REQUIP) 3 MG tablet TAKE 1 TABLET BY MOUTH ONCE DAILY AT BEDTIME 90 tablet 0  . sulfamethoxazole-trimethoprim (BACTRIM DS,SEPTRA DS) 800-160 MG tablet Take 1 tablet by mouth every Monday, Wednesday, and Friday.    . SYMBICORT 160-4.5 MCG/ACT inhaler INHALE TWO PUFFS BY MOUTH ONCE DAILY IN THE MORNING (Patient taking differently: INHALE TWO PUFFS BY MOUTH TWICE DAILY) 10.2 g 1  . tiotropium (SPIRIVA HANDIHALER) 18 MCG inhalation capsule Place 1 capsule (18 mcg total) daily into inhaler and inhale. 30 capsule 5  . triamcinolone cream (KENALOG) 0.1 % Apply 1 application topically 2 (two) times daily as needed  (rash/itching).    . venlafaxine (EFFEXOR) 75 MG tablet TAKE 1 TABLET BY MOUTH IN THE EVENING FOR 7 DAYS THEN INCREASE TO 2 BY MOUTH EVERY EVENING 180 tablet 0  . torsemide (DEMADEX) 20 MG tablet Take 4 tablets (80 mg total) by mouth daily. 120 tablet 0  . apixaban (ELIQUIS) 5 MG TABS tablet Take 5 mg by mouth 2 (two) times daily.     No facility-administered medications prior to visit.      Allergies:   Nsaids and Pregabalin   Social History   Socioeconomic History  . Marital status: Married    Spouse name: Not on file  . Number of children: 2  . Years of education: Not on file  . Highest education level:  Not on file  Occupational History  . Occupation: disabled  Social Needs  . Financial resource strain: Not on file  . Food insecurity:    Worry: Not on file    Inability: Not on file  . Transportation needs:    Medical: Not on file    Non-medical: Not on file  Tobacco Use  . Smoking status: Former Smoker    Types: Cigarettes    Last attempt to quit: 07/13/1993    Years since quitting: 23.9  . Smokeless tobacco: Never Used  Substance and Sexual Activity  . Alcohol use: Yes    Alcohol/week: 0.0 oz    Comment: socially  . Drug use: No  . Sexual activity: Not on file  Lifestyle  . Physical activity:    Days per week: Not on file    Minutes per session: Not on file  . Stress: Not on file  Relationships  . Social connections:    Talks on phone: Not on file    Gets together: Not on file    Attends religious service: Not on file    Active member of club or organization: Not on file    Attends meetings of clubs or organizations: Not on file    Relationship status: Not on file  Other Topics Concern  . Not on file  Social History Narrative  . Not on file     Family History:  The patient's family history includes Clotting disorder in his mother; Diabetes in his brother, father, maternal aunt, and sister; Heart attack in his father and mother; Prostate cancer in his  brother.   Review of Systems:   Please see the history of present illness.     General:  No chills, fever, night sweats or weight changes.  Cardiovascular:  No chest pain, edema, orthopnea, palpitations, paroxysmal nocturnal dyspnea. Positive for dyspnea on exertion.  Dermatological: No rash, lesions/masses Respiratory: No cough, dyspnea Urologic: No hematuria, dysuria Abdominal:   No nausea, vomiting, diarrhea, bright red blood per rectum, melena, or hematemesis Neurologic:  No visual changes, wkns, changes in mental status. All other systems reviewed and are otherwise negative except as noted above.   Physical Exam:    VS:  BP 130/82   Pulse 100   Ht 5\' 6"  (1.676 m)   Wt 205 lb (93 kg)   SpO2 98%   BMI 33.09 kg/m    General: Well developed, well nourished Caucasian male appearing in no acute distress. Head: Normocephalic, atraumatic, sclera non-icteric, no xanthomas, nares are without discharge.  Neck: No carotid bruits. JVD not elevated.  Lungs: Respirations regular and unlabored, without wheezes or rales. On 2L [place].  Heart: Regular rate and rhythm. No S3 or S4.  No murmur, no rubs, or gallops appreciated. Abdomen: Soft, non-tender, non-distended with normoactive bowel sounds. No hepatomegaly. No rebound/guarding. No obvious abdominal masses. Msk:  Strength and tone appear normal for age. No joint deformities or effusions. Extremities: No clubbing or cyanosis. No lower extremity edema.  Distal pedal pulses are 2+ bilaterally. Neuro: Alert and oriented X 3. Moves all extremities spontaneously. No focal deficits noted. Psych:  Responds to questions appropriately with a normal affect. Skin: No rashes or lesions noted  Wt Readings from Last 3 Encounters:  06/23/17 205 lb (93 kg)  06/16/17 204 lb (92.5 kg)  06/06/17 194 lb 6.4 oz (88.2 kg)     Studies/Labs Reviewed:   EKG:  EKG is not ordered today.    Recent Labs: 01/01/2017: B Natriuretic  Peptide 140.6 06/06/2017: ALT 25;  BUN 34; Creatinine, Ser 1.24; Hemoglobin 11.1; Magnesium 2.1; Platelets 228; Potassium 4.0; Sodium 140   Lipid Panel    Component Value Date/Time   CHOL 191 06/07/2017 0643   CHOL 253 (H) 08/29/2016 1126   TRIG 140 06/07/2017 0643   TRIG 124 05/16/2014 0935   HDL 59 06/07/2017 0643   HDL 79 08/29/2016 1126   HDL 53 05/16/2014 0935   CHOLHDL 3.2 06/07/2017 0643   VLDL 28 06/07/2017 0643   LDLCALC 104 (H) 06/07/2017 0643   LDLCALC 138 (H) 08/29/2016 1126    Additional studies/ records that were reviewed today include:   Echocardiogram: 06/2017 Study Conclusions  - Left ventricle: The cavity size was normal. Wall thickness was at   the upper limits of normal. The estimated ejection fraction was   55%. Wall motion was normal; there were no regional wall motion   abnormalities. Doppler parameters are consistent with abnormal   left ventricular relaxation (grade 1 diastolic dysfunction). - Aortic valve: Mildly calcified annulus. Trileaflet; mildly   calcified leaflets. - Mitral valve: There was trivial regurgitation. - Right atrium: Central venous pressure (est): 3 mm Hg. - Tricuspid valve: There was trivial regurgitation. - Pulmonary arteries: Systolic pressure could not be accurately   estimated. - Pericardium, extracardiac: A prominent pericardial fat pad was   present.  Assessment:    1. Orthostatic hypotension   2. Postural dizziness with presyncope   3. Essential hypertension   4. Other chronic pulmonary embolism without acute cor pulmonale (HCC)   5. Chronic respiratory failure with hypoxia and hypercapnia (HCC)      Plan:   In order of problems listed above:  1. Orthostatic Hypotension/ Presyncope - recently admitted for evaluation of presyncope and was found to be significantly orthostatic with BP dropping from 140/75 to 84/64 with standing. Echocardiogram showed a preserved EF of 55% with no regional wall motion abnormalities and carotid dopplers showed no  evidence of significant stenosis.  Orthostasis was thought to be secondary to dehydration in the setting of diuretic dosing. This was reduced from 100 mg daily to 80 mg daily at the time of discharge. - He denies any repeat presyncopal episodes but does report dizziness when changing positions. He has no evidence of volume overload by physical examination. Will further reduce Torsemide to 60 mg daily.  I encouraged him to follow his weight in the ambulatory setting with this dose adjustment.  2. HTN - BP is well controlled at 130/82 during today's visit. - Continue Lopressor 12.5 mg twice daily and Torsemide with dose adjustment as outlined above.  3. Chronic Pulmonary Embolism - Followed by Pulmonology. He remains on Eliquis for anticoagulation and denies any evidence of active bleeding.  4. Chronic Hypoxic Respiratory Failure - Remains on 2L nasal cannula at baseline. Is being followed by [facility] and is under consideration for a lung transplant in the future.   Medication Adjustments/Labs and Tests Ordered: Current medicines are reviewed at length with the patient today.  Concerns regarding medicines are outlined above.  Medication changes, Labs and Tests ordered today are listed in the Patient Instructions below. Patient Instructions  Medication Instructions:  Your physician has recommended you make the following change in your medication: Decrease Torsemide to 60 mg Daily   Labwork: NONE   Testing/Procedures: NONE   Follow-Up: Your physician recommends that you schedule a follow-up appointment in: 2 Months.   Any Other Special Instructions Will Be Listed Below (If  Applicable).  If you need a refill on your cardiac medications before your next appointment, please call your pharmacy. Thank you for choosing [facility]!     Signed, Erma Heritage, PA-C  06/23/2017 3:45 PM    [facility] [address]  [place], [place] [address] Phone: [phone]

## 2017-06-23 ENCOUNTER — Ambulatory Visit: Payer: Medicare HMO | Admitting: Student

## 2017-06-23 ENCOUNTER — Encounter: Payer: Self-pay | Admitting: Student

## 2017-06-23 VITALS — BP 130/82 | HR 100 | Ht 66.0 in | Wt 205.0 lb

## 2017-06-23 DIAGNOSIS — I951 Orthostatic hypotension: Secondary | ICD-10-CM | POA: Diagnosis not present

## 2017-06-23 DIAGNOSIS — I2782 Chronic pulmonary embolism: Secondary | ICD-10-CM | POA: Diagnosis not present

## 2017-06-23 DIAGNOSIS — J9611 Chronic respiratory failure with hypoxia: Secondary | ICD-10-CM | POA: Diagnosis not present

## 2017-06-23 DIAGNOSIS — R55 Syncope and collapse: Secondary | ICD-10-CM | POA: Diagnosis not present

## 2017-06-23 DIAGNOSIS — R42 Dizziness and giddiness: Secondary | ICD-10-CM | POA: Diagnosis not present

## 2017-06-23 DIAGNOSIS — J9612 Chronic respiratory failure with hypercapnia: Secondary | ICD-10-CM | POA: Diagnosis not present

## 2017-06-23 DIAGNOSIS — I10 Essential (primary) hypertension: Secondary | ICD-10-CM

## 2017-06-23 MED ORDER — TORSEMIDE 20 MG PO TABS: 60.0000 mg | ORAL_TABLET | Freq: Every day | ORAL | 3 refills | Status: DC

## 2017-06-23 NOTE — Patient Instructions (Signed)
Medication Instructions:  Your physician has recommended you make the following change in your medication: Decrease Torsemide to 60 mg Daily    Labwork: NONE   Testing/Procedures: NONE   Follow-Up: Your physician recommends that you schedule a follow-up appointment in: 2 Months.    Any Other Special Instructions Will Be Listed Below (If Applicable).     If you need a refill on your cardiac medications before your next appointment, please call your pharmacy. Thank you for choosing [facility]!

## 2017-06-24 ENCOUNTER — Encounter (HOSPITAL_COMMUNITY): Payer: Self-pay | Admitting: *Deleted

## 2017-06-24 ENCOUNTER — Observation Stay (HOSPITAL_COMMUNITY)
Admission: EM | Admit: 2017-06-24 | Discharge: 2017-06-27 | Disposition: A | Discharge status: Home / Self Care | Payer: Medicare HMO | Attending: Internal Medicine | Admitting: Internal Medicine

## 2017-06-24 ENCOUNTER — Other Ambulatory Visit: Payer: Self-pay

## 2017-06-24 DIAGNOSIS — R079 Chest pain, unspecified: Secondary | ICD-10-CM | POA: Diagnosis not present

## 2017-06-24 DIAGNOSIS — I2782 Chronic pulmonary embolism: Secondary | ICD-10-CM | POA: Insufficient documentation

## 2017-06-24 DIAGNOSIS — D638 Anemia in other chronic diseases classified elsewhere: Secondary | ICD-10-CM | POA: Diagnosis present

## 2017-06-24 DIAGNOSIS — Z7901 Long term (current) use of anticoagulants: Secondary | ICD-10-CM | POA: Diagnosis not present

## 2017-06-24 DIAGNOSIS — Z87891 Personal history of nicotine dependence: Secondary | ICD-10-CM | POA: Diagnosis not present

## 2017-06-24 DIAGNOSIS — C9101 Acute lymphoblastic leukemia, in remission: Secondary | ICD-10-CM | POA: Diagnosis not present

## 2017-06-24 DIAGNOSIS — R69 Illness, unspecified: Secondary | ICD-10-CM | POA: Diagnosis not present

## 2017-06-24 DIAGNOSIS — R0789 Other chest pain: Secondary | ICD-10-CM | POA: Diagnosis not present

## 2017-06-24 DIAGNOSIS — I5042 Chronic combined systolic (congestive) and diastolic (congestive) heart failure: Secondary | ICD-10-CM | POA: Insufficient documentation

## 2017-06-24 DIAGNOSIS — E1165 Type 2 diabetes mellitus with hyperglycemia: Secondary | ICD-10-CM | POA: Diagnosis present

## 2017-06-24 DIAGNOSIS — Z7984 Long term (current) use of oral hypoglycemic drugs: Secondary | ICD-10-CM | POA: Insufficient documentation

## 2017-06-24 DIAGNOSIS — I5032 Chronic diastolic (congestive) heart failure: Secondary | ICD-10-CM | POA: Diagnosis present

## 2017-06-24 DIAGNOSIS — I110 Hypertensive heart disease with heart failure: Secondary | ICD-10-CM | POA: Insufficient documentation

## 2017-06-24 DIAGNOSIS — IMO0002 Reserved for concepts with insufficient information to code with codable children: Secondary | ICD-10-CM | POA: Diagnosis present

## 2017-06-24 DIAGNOSIS — F418 Other specified anxiety disorders: Secondary | ICD-10-CM | POA: Diagnosis not present

## 2017-06-24 DIAGNOSIS — J449 Chronic obstructive pulmonary disease, unspecified: Secondary | ICD-10-CM | POA: Diagnosis present

## 2017-06-24 DIAGNOSIS — R0602 Shortness of breath: Secondary | ICD-10-CM | POA: Diagnosis present

## 2017-06-24 DIAGNOSIS — R404 Transient alteration of awareness: Secondary | ICD-10-CM | POA: Diagnosis not present

## 2017-06-24 DIAGNOSIS — I951 Orthostatic hypotension: Secondary | ICD-10-CM | POA: Diagnosis not present

## 2017-06-24 DIAGNOSIS — Z79899 Other long term (current) drug therapy: Secondary | ICD-10-CM | POA: Insufficient documentation

## 2017-06-24 DIAGNOSIS — R531 Weakness: Secondary | ICD-10-CM | POA: Diagnosis not present

## 2017-06-24 LAB — URINALYSIS, ROUTINE W REFLEX MICROSCOPIC
Bilirubin Urine: NEGATIVE
Glucose, UA: NEGATIVE mg/dL
Hgb urine dipstick: NEGATIVE
KETONES UR: NEGATIVE mg/dL
LEUKOCYTES UA: NEGATIVE
NITRITE: NEGATIVE
PH: 6 (ref 5.0–8.0)
PROTEIN: NEGATIVE mg/dL
Specific Gravity, Urine: 1.012 (ref 1.005–1.030)

## 2017-06-24 NOTE — ED Triage Notes (Signed)
Pt c/o chest pain that started yesterday, states that the pain in entire chest area, described pain as a soreness, states that he has been working out with weights of two pounds each. Pt states that he feels weak all over today,

## 2017-06-25 ENCOUNTER — Other Ambulatory Visit: Payer: Self-pay

## 2017-06-25 ENCOUNTER — Encounter (HOSPITAL_COMMUNITY): Payer: Self-pay | Admitting: Family Medicine

## 2017-06-25 ENCOUNTER — Emergency Department (HOSPITAL_COMMUNITY): Payer: Medicare HMO

## 2017-06-25 DIAGNOSIS — E1165 Type 2 diabetes mellitus with hyperglycemia: Secondary | ICD-10-CM | POA: Diagnosis not present

## 2017-06-25 DIAGNOSIS — D638 Anemia in other chronic diseases classified elsewhere: Secondary | ICD-10-CM | POA: Diagnosis not present

## 2017-06-25 DIAGNOSIS — I951 Orthostatic hypotension: Secondary | ICD-10-CM

## 2017-06-25 DIAGNOSIS — I5032 Chronic diastolic (congestive) heart failure: Secondary | ICD-10-CM | POA: Diagnosis not present

## 2017-06-25 DIAGNOSIS — R079 Chest pain, unspecified: Secondary | ICD-10-CM | POA: Diagnosis not present

## 2017-06-25 DIAGNOSIS — I2782 Chronic pulmonary embolism: Secondary | ICD-10-CM | POA: Diagnosis not present

## 2017-06-25 DIAGNOSIS — J449 Chronic obstructive pulmonary disease, unspecified: Secondary | ICD-10-CM | POA: Diagnosis not present

## 2017-06-25 DIAGNOSIS — R69 Illness, unspecified: Secondary | ICD-10-CM | POA: Diagnosis not present

## 2017-06-25 DIAGNOSIS — F418 Other specified anxiety disorders: Secondary | ICD-10-CM | POA: Diagnosis not present

## 2017-06-25 LAB — GLUCOSE, CAPILLARY
GLUCOSE-CAPILLARY: 91 mg/dL (ref 65–99)
Glucose-Capillary: 139 mg/dL — ABNORMAL HIGH (ref 65–99)
Glucose-Capillary: 120 mg/dL — ABNORMAL HIGH (ref 65–99)
Glucose-Capillary: 125 mg/dL — ABNORMAL HIGH (ref 65–99)
Glucose-Capillary: 139 mg/dL — ABNORMAL HIGH (ref 65–99)

## 2017-06-25 LAB — CBC WITH DIFFERENTIAL/PLATELET
Basophils Absolute: 0 10*3/uL (ref 0.0–0.1)
Basophils Relative: 0 %
Eosinophils Absolute: 0 10*3/uL (ref 0.0–0.7)
Eosinophils Relative: 0 %
HEMATOCRIT: 35.6 % — AB (ref 39.0–52.0)
Hemoglobin: 11.3 g/dL — ABNORMAL LOW (ref 13.0–17.0)
LYMPHS ABS: 1.2 10*3/uL (ref 0.7–4.0)
LYMPHS PCT: 12 %
MCH: 34.3 pg — ABNORMAL HIGH (ref 26.0–34.0)
MCHC: 31.7 g/dL (ref 30.0–36.0)
MCV: 108.2 fL — AB (ref 78.0–100.0)
MONO ABS: 1 10*3/uL (ref 0.1–1.0)
MONOS PCT: 10 %
NEUTROS ABS: 7.3 10*3/uL (ref 1.7–7.7)
Neutrophils Relative %: 78 %
Platelets: 229 10*3/uL (ref 150–400)
RBC: 3.29 MIL/uL — ABNORMAL LOW (ref 4.22–5.81)
RDW: 16.6 % — ABNORMAL HIGH (ref 11.5–15.5)
WBC: 9.5 10*3/uL (ref 4.0–10.5)

## 2017-06-25 LAB — VITAMIN B12: Vitamin B-12: 715 pg/mL (ref 180–914)

## 2017-06-25 LAB — COMPREHENSIVE METABOLIC PANEL
ALT: 28 U/L (ref 17–63)
ANION GAP: 15 (ref 5–15)
AST: 22 U/L (ref 15–41)
Albumin: 4 g/dL (ref 3.5–5.0)
Alkaline Phosphatase: 52 U/L (ref 38–126)
BILIRUBIN TOTAL: 1 mg/dL (ref 0.3–1.2)
BUN: 27 mg/dL — AB (ref 6–20)
CALCIUM: 9.7 mg/dL (ref 8.9–10.3)
CO2: 37 mmol/L — ABNORMAL HIGH (ref 22–32)
Chloride: 91 mmol/L — ABNORMAL LOW (ref 101–111)
Creatinine, Ser: 1.24 mg/dL (ref 0.61–1.24)
GFR calc Af Amer: >60 mL/min (ref >60)
Glucose, Bld: 140 mg/dL — ABNORMAL HIGH (ref 65–99)
Potassium: 4.6 mmol/L (ref 3.5–5.1)
Sodium: 143 mmol/L (ref 135–145)
TOTAL PROTEIN: 6.7 g/dL (ref 6.5–8.1)

## 2017-06-25 LAB — PROTIME-INR
INR: 0.99
PROTHROMBIN TIME: 13 s (ref 11.4–15.2)

## 2017-06-25 LAB — LIPASE, BLOOD: LIPASE: 29 U/L (ref 11–51)

## 2017-06-25 LAB — CORTISOL: Cortisol, Plasma: 15.1 ug/dL

## 2017-06-25 LAB — TROPONIN I
TROPONIN I: 0.04 ng/mL — AB (ref <0.03)
TROPONIN I: 0.03 ng/mL — AB (ref <0.03)
Troponin I: 0.03 ng/mL (ref <0.03)
Troponin I: <0.03 ng/mL (ref <0.03)

## 2017-06-25 LAB — BRAIN NATRIURETIC PEPTIDE: B NATRIURETIC PEPTIDE 5: 91 pg/mL (ref 0.0–100.0)

## 2017-06-25 MED ORDER — INSULIN ASPART 100 UNIT/ML ~~LOC~~ SOLN: 0.0000 [IU] | Freq: Every day | SUBCUTANEOUS | Status: DC

## 2017-06-25 MED ORDER — POLYVINYL ALCOHOL 1.4 % OP SOLN
1.0000 [drp] | Freq: Every day | OPHTHALMIC | Status: DC | PRN
  Filled 2017-06-25: qty 15

## 2017-06-25 MED ORDER — VENLAFAXINE HCL ER 75 MG PO CP24
75.0000 mg | ORAL_CAPSULE | Freq: Every day | ORAL | Status: DC
  Administered 2017-06-25 – 2017-06-27 (×3): 75 mg via ORAL
  Filled 2017-06-25 (×3): qty 1

## 2017-06-25 MED ORDER — RISAQUAD PO CAPS
1.0000 | ORAL_CAPSULE | Freq: Every day | ORAL | Status: DC
  Administered 2017-06-25 – 2017-06-27 (×3): 1 via ORAL
  Filled 2017-06-25 (×3): qty 1

## 2017-06-25 MED ORDER — SODIUM CHLORIDE 0.9 % IV SOLN: INTRAVENOUS | Status: DC

## 2017-06-25 MED ORDER — SODIUM CHLORIDE 0.9 % IV BOLUS
500.0000 mL | Freq: Once | INTRAVENOUS | Status: AC
  Administered 2017-06-25: 500 mL via INTRAVENOUS

## 2017-06-25 MED ORDER — ROPINIROLE HCL 1 MG PO TABS
3.0000 mg | ORAL_TABLET | Freq: Every day | ORAL | Status: DC
  Administered 2017-06-25 – 2017-06-26 (×2): 3 mg via ORAL
  Filled 2017-06-25 (×2): qty 3

## 2017-06-25 MED ORDER — VENLAFAXINE HCL 37.5 MG PO TABS: 75.0000 mg | ORAL_TABLET | Freq: Every evening | ORAL | Status: DC

## 2017-06-25 MED ORDER — HYDROCODONE-ACETAMINOPHEN 5-325 MG PO TABS
1.0000 | ORAL_TABLET | ORAL | Status: DC | PRN
  Administered 2017-06-27: 1 via ORAL
  Filled 2017-06-25: qty 1
  Filled 2017-06-25: qty 2

## 2017-06-25 MED ORDER — ONDANSETRON HCL 4 MG/2ML IJ SOLN: 4.0000 mg | Freq: Four times a day (QID) | INTRAMUSCULAR | Status: DC | PRN

## 2017-06-25 MED ORDER — CLONAZEPAM 0.5 MG PO TABS
1.0000 mg | ORAL_TABLET | Freq: Every day | ORAL | Status: DC
  Administered 2017-06-25 – 2017-06-26 (×2): 1 mg via ORAL
  Filled 2017-06-25 (×2): qty 2

## 2017-06-25 MED ORDER — FOLIC ACID 1 MG PO TABS
1.0000 mg | ORAL_TABLET | Freq: Every day | ORAL | Status: DC
  Administered 2017-06-25 – 2017-06-27 (×3): 1 mg via ORAL
  Filled 2017-06-25 (×3): qty 1

## 2017-06-25 MED ORDER — APIXABAN 5 MG PO TABS
5.0000 mg | ORAL_TABLET | Freq: Two times a day (BID) | ORAL | Status: DC
  Administered 2017-06-25 – 2017-06-27 (×5): 5 mg via ORAL
  Filled 2017-06-25 (×5): qty 1

## 2017-06-25 MED ORDER — SULFAMETHOXAZOLE-TRIMETHOPRIM 800-160 MG PO TABS
1.0000 | ORAL_TABLET | ORAL | Status: DC
  Administered 2017-06-26: 1 via ORAL
  Filled 2017-06-25: qty 1

## 2017-06-25 MED ORDER — PANTOPRAZOLE SODIUM 40 MG PO TBEC
40.0000 mg | DELAYED_RELEASE_TABLET | Freq: Every day | ORAL | Status: DC
  Administered 2017-06-25 – 2017-06-26 (×2): 40 mg via ORAL
  Filled 2017-06-25 (×2): qty 1

## 2017-06-25 MED ORDER — UMECLIDINIUM BROMIDE 62.5 MCG/INH IN AEPB
1.0000 | INHALATION_SPRAY | Freq: Every day | RESPIRATORY_TRACT | Status: DC
  Administered 2017-06-25 – 2017-06-27 (×3): 1 via RESPIRATORY_TRACT
  Filled 2017-06-25: qty 7

## 2017-06-25 MED ORDER — MONTELUKAST SODIUM 10 MG PO TABS
10.0000 mg | ORAL_TABLET | Freq: Every day | ORAL | Status: DC
  Administered 2017-06-25 – 2017-06-27 (×3): 10 mg via ORAL
  Filled 2017-06-25 (×3): qty 1

## 2017-06-25 MED ORDER — TIOTROPIUM BROMIDE MONOHYDRATE 18 MCG IN CAPS
18.0000 ug | ORAL_CAPSULE | Freq: Every day | RESPIRATORY_TRACT | Status: DC
  Administered 2017-06-25 – 2017-06-27 (×3): 18 ug via RESPIRATORY_TRACT
  Filled 2017-06-25: qty 5

## 2017-06-25 MED ORDER — SODIUM CHLORIDE 0.9 % IV SOLN
INTRAVENOUS | Status: AC
  Administered 2017-06-25: 04:00:00 via INTRAVENOUS

## 2017-06-25 MED ORDER — CITALOPRAM HYDROBROMIDE 20 MG PO TABS
40.0000 mg | ORAL_TABLET | Freq: Every day | ORAL | Status: DC
  Administered 2017-06-25 – 2017-06-27 (×3): 40 mg via ORAL
  Filled 2017-06-25 (×2): qty 2
  Filled 2017-06-25 (×2): qty 1
  Filled 2017-06-25: qty 2

## 2017-06-25 MED ORDER — MOMETASONE FURO-FORMOTEROL FUM 200-5 MCG/ACT IN AERO
2.0000 | INHALATION_SPRAY | Freq: Two times a day (BID) | RESPIRATORY_TRACT | Status: DC
  Administered 2017-06-25 – 2017-06-27 (×4): 2 via RESPIRATORY_TRACT
  Filled 2017-06-25 (×2): qty 8.8

## 2017-06-25 MED ORDER — BISACODYL 5 MG PO TBEC: 5.0000 mg | DELAYED_RELEASE_TABLET | Freq: Every day | ORAL | Status: DC | PRN

## 2017-06-25 MED ORDER — INSULIN ASPART 100 UNIT/ML ~~LOC~~ SOLN
0.0000 [IU] | Freq: Three times a day (TID) | SUBCUTANEOUS | Status: DC
  Administered 2017-06-25 – 2017-06-26 (×3): 1 [IU] via SUBCUTANEOUS
  Administered 2017-06-26: 2 [IU] via SUBCUTANEOUS
  Administered 2017-06-27: 3 [IU] via SUBCUTANEOUS

## 2017-06-25 MED ORDER — ACETAMINOPHEN 650 MG RE SUPP: 650.0000 mg | Freq: Four times a day (QID) | RECTAL | Status: DC | PRN

## 2017-06-25 MED ORDER — ALBUTEROL SULFATE (2.5 MG/3ML) 0.083% IN NEBU
2.5000 mg | INHALATION_SOLUTION | Freq: Four times a day (QID) | RESPIRATORY_TRACT | Status: DC | PRN
  Filled 2017-06-25: qty 3

## 2017-06-25 MED ORDER — HYDROMORPHONE HCL 2 MG PO TABS
4.0000 mg | ORAL_TABLET | ORAL | Status: DC | PRN
  Administered 2017-06-25 – 2017-06-27 (×4): 4 mg via ORAL
  Filled 2017-06-25 (×4): qty 2

## 2017-06-25 MED ORDER — POLYETHYLENE GLYCOL 3350 17 G PO PACK
1.0000 | PACK | Freq: Every day | ORAL | Status: DC
  Administered 2017-06-25 – 2017-06-26 (×2): 17 g via ORAL
  Filled 2017-06-25 (×3): qty 1

## 2017-06-25 MED ORDER — ADULT MULTIVITAMIN W/MINERALS CH
1.0000 | ORAL_TABLET | Freq: Every day | ORAL | Status: DC
  Administered 2017-06-25 – 2017-06-27 (×3): 1 via ORAL
  Filled 2017-06-25 (×3): qty 1

## 2017-06-25 MED ORDER — NEFAZODONE HCL 50 MG PO TABS: 50.0000 mg | ORAL_TABLET | Freq: Every day | ORAL | Status: DC

## 2017-06-25 MED ORDER — ONDANSETRON HCL 4 MG PO TABS: 4.0000 mg | ORAL_TABLET | Freq: Four times a day (QID) | ORAL | Status: DC | PRN

## 2017-06-25 MED ORDER — SODIUM CHLORIDE 0.9% FLUSH
3.0000 mL | Freq: Two times a day (BID) | INTRAVENOUS | Status: DC
  Administered 2017-06-25 – 2017-06-27 (×3): 3 mL via INTRAVENOUS

## 2017-06-25 MED ORDER — MAGNESIUM HYDROXIDE 400 MG/5ML PO SUSP: 30.0000 mL | Freq: Every day | ORAL | Status: DC | PRN

## 2017-06-25 MED ORDER — ACETAMINOPHEN 325 MG PO TABS
650.0000 mg | ORAL_TABLET | Freq: Four times a day (QID) | ORAL | Status: DC | PRN
  Administered 2017-06-26: 650 mg via ORAL
  Filled 2017-06-25: qty 2

## 2017-06-25 NOTE — ED Notes (Addendum)
Pt ambulated with standby assist from this nurse. PT on 2 L O2 while ambulating. Denies dizziness. Steady gait. Pt was SOB upon arrival back to room. Pt was tachycardic with rate of 122 bpm. Increased respirations with rate of 25.  Dr Wyvonnia Dusky aware.

## 2017-06-25 NOTE — ED Notes (Signed)
Pt given water for fluid challenge 

## 2017-06-25 NOTE — Progress Notes (Signed)
Patient seen and examined.  Admitted after midnight secondary to lightheadedness, generalized weakness and orthostatic hypotension.  Patient with a past medical history significant for lymphocytic leukemia in remission, COPD with chronic hypoxic respiratory failure (oxygen supplementation), chronic pulmonary embolism on Eliquis, chronic diastolic CHF (with preserved ejection fraction and grade 1 diastolic dysfunction on the most recent 2D echo) and type 2 diabetes. Patient has been struggling lately with hypotension and his diuretics has been adjusted twice already. He denies CP and is just feeling tired. Patient with positive orthostatic changes. Please refer to H&P written by Dr. Myna Hidalgo on 06/25/17 for further info/details.  Plan: -continue gentle IVF's -follow VS -continue holding antihypertensive agents and diuretics. -will ask for cardiology inputs, as he will require further adjustments on his diuretics  -check cortisol level  Barton Dubois MD [phone]

## 2017-06-25 NOTE — ED Provider Notes (Signed)
[facility] EMERGENCY DEPARTMENT Provider Note   CSN: [id number] Arrival date & time: 06/24/17  2258     History   Chief Complaint Chief Complaint  Patient presents with  . Weakness    HPI Derek Shepherd is a 62 y.o. male.  62 y.o. 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  Patient reports the chest pain is only there when his chest is touched.  Is worse with palpation and deep breathing.  He states he has had increasing shortness of breath over the past 2 days as well associated with generalized weakness.  Notably he was seen in the cardiology clinic yesterday and found to be orthostatic.  Blood pressure was 84 systolic with standing.  His diuretics were decreased.  Patient states he is also begun working out with weights and wonders if that is was hurting his chest.  He states this pain is different than what he was admitted for 2 weeks ago.  He denies any cough or fever.  He denies any leg pain or leg swelling.  States compliance with his Eliquis.  Denies any vomiting or diarrhea.  The history is provided by the patient and a relative.  Weakness  Pertinent negatives include no shortness of breath, no chest pain and no vomiting.    Past Medical History:  Diagnosis Date  . Adenomatous colon polyp    Tubular adenoma  . Anemia   . Anxiety   . Arthritis   . Asthma   . Constipation   . COPD (chronic obstructive pulmonary disease) ([place])   . Depression   . Diverticulosis   . Gallstones   . GERD (gastroesophageal reflux disease)   . 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 History of bone marrow transplant ([place])   . History of pulmonary  embolus (PE)    Recurrent  . 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 Type 2 diabetes mellitus [facility])     Patient Active Problem List   Diagnosis Date Noted  . Chest pain 06/06/2017  . History of syncope 06/06/2017  . Depression with anxiety 06/06/2017  . Orthostatic hypotension 01/29/2017  . Thrombocytopenia due to drugs 01/29/2017  . COPD (chronic obstructive pulmonary disease)/BOOP 01/29/2017  . Chronic respiratory failure with hypoxia and hypercapnia ([place]) 01/29/2017  . History of chronic pulmonary embolism 01/29/2017  . Chronic systolic heart failure ([place]) 01/29/2017  . Anemia 01/29/2017  . History of Leukemia-lymphoma, T-cell, acute, HTLV-I-associated (s/p BMT 2011) 01/29/2017  . Diabetes mellitus type 2, uncontrolled ([place]) 01/29/2017  . Acute kidney injury ([place]) 01/29/2017  . Chronic hypercapnic respiratory failure ([place]) 01/23/2017  . Sepsis ([place]) 01/02/2017  . Fever 01/02/2017  . Bronchiectasis ([place]) 12/13/2016  . Symptomatic anemia 04/07/2016  . Hyponatremia 03/04/2016  . 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 Anemia of chronic disease 11/24/2015  . Chronic pulmonary embolism ([place]) 11/24/2015  . Thrombocytopenia ([place]) 11/24/2015  . Depression 11/24/2015  . Chronic combined systolic and diastolic CHF (congestive heart failure) ([place]) 11/24/2015  . Controlled type 2 diabetes mellitus without complication, without long-term current use of insulin ([place]) 11/24/2015  . History of bone marrow transplant ([place])   . ALL (acute lymphoid leukemia) in remission (  [place]) 03/20/2013    Past Surgical History:  Procedure Laterality Date  . BONE MARROW TRANSPLANT  2011  . CHOLECYSTECTOMY    . COLONOSCOPY W/ BIOPSIES    . EXPLORATORY LAPAROTOMY     1986. Had tractor flipped over while he was operating it.   Marland Kitchen HERNIA REPAIR     x 2  . LUNG SURGERY Left         Home Medications    Prior to Admission medications   Medication Sig Start Date End Date Taking?  Authorizing Provider  albuterol (PROAIR HFA) 108 (90 Base) MCG/ACT inhaler Inhale 2 puffs into the lungs daily.    [provider]  citalopram (CELEXA) 40 MG tablet TAKE 1 TABLET BY MOUTH ONCE DAILY. REDUCE TO ONE-HALF TABLET ONCE DAILY WHILE TAKING FLUCONAZOLE FOR 14 DAYS 06/20/17   Claretta Fraise, MD  clonazePAM (KLONOPIN) 0.5 MG tablet Take 1 mg by mouth at bedtime.     [provider]  folic acid (FOLVITE) 1 MG tablet TAKE 1 TABLET BY MOUTH ONCE DAILY Patient taking differently: TAKE 1 TABLET  (1 MG )BY MOUTH ONCE DAILY 12/16/16   Claretta Fraise, MD  glucose blood test strip Use as instructed 05/02/17   Claretta Fraise, MD  HYDROmorphone (DILAUDID) 4 MG tablet Take 4 mg by mouth 5 (five) times daily.    [provider]  INCRUSE ELLIPTA 62.5 MCG/INH AEPB INHALE 1 PUFF BY MOUTH ONCE DAILY 06/12/17   Claretta Fraise, MD  lactulose [facility]) 10 GM/15ML solution Take by mouth daily as needed (constipation).     [provider]  levalbuterol (XOPENEX) 1.25 MG/3ML nebulizer solution USE ONE VIAL IN NEBULIZER 4 TIMES DAILY Patient taking differently: USE ONE VIAL (0.125 MG)  IN NEBULIZER 2 TIMES DAILY 08/19/16   Claretta Fraise, MD  metFORMIN (GLUCOPHAGE) 500 MG tablet Take 1 tablet (500 mg total) by mouth 2 (two) times daily with a meal. 03/28/17   Claretta Fraise, MD  metoprolol tartrate (LOPRESSOR) 25 MG tablet Take 12.5 mg by mouth 2 (two) times daily.    [provider]  montelukast (SINGULAIR) 10 MG tablet Take 10 mg by mouth daily.    [provider]  montelukast (SINGULAIR) 10 MG tablet TAKE 1 TABLET BY MOUTH ONCE DAILY 06/20/17   Claretta Fraise, MD  Multiple Vitamin (MULTIVITAMIN WITH MINERALS) TABS tablet Take 1 tablet by mouth daily.    [provider]  nefazodone (SERZONE) 50 MG tablet Take 50 mg by mouth at bedtime. 01/04/17   [provider]  oxymetazoline (AFRIN) 0.05 % nasal spray Place 1 spray into both nostrils daily as  needed (nose bleeds).    [provider]  pantoprazole (PROTONIX) 40 MG tablet Take 1 tablet (40 mg total) by mouth at bedtime. 03/28/17   Claretta Fraise, MD  Polyethyl Glycol-Propyl Glycol (SYSTANE) 0.4-0.3 % SOLN Place 1 drop into both eyes 5 (five) times daily as needed (for dry eyes).     [provider]  polyethylene glycol (MIRALAX / GLYCOLAX) packet Take 1 packet by mouth daily. 06/01/17   [provider]  potassium chloride (MICRO-K) 10 MEQ CR capsule TAKE THREE CAPSULES BY MOUTH TWICE DAILY 03/28/17   Claretta Fraise, MD  Probiotic Product (TRUBIOTICS) CAPS Take 1 capsule by mouth daily.    [provider]  rOPINIRole (REQUIP) 3 MG tablet TAKE 1 TABLET BY MOUTH ONCE DAILY AT BEDTIME 04/27/17   Claretta Fraise, MD  sulfamethoxazole-trimethoprim (BACTRIM DS,SEPTRA DS) 800-160 MG tablet Take 1 tablet  by mouth every Monday, Wednesday, and Friday.    [provider]  SYMBICORT 160-4.5 MCG/ACT inhaler INHALE TWO PUFFS BY MOUTH ONCE DAILY IN THE MORNING Patient taking differently: INHALE TWO PUFFS BY MOUTH TWICE DAILY 08/19/16   Claretta Fraise, MD  tiotropium (SPIRIVA HANDIHALER) 18 MCG inhalation capsule Place 1 capsule (18 mcg total) daily into inhaler and inhale. 01/17/17   Claretta Fraise, MD  torsemide (DEMADEX) 20 MG tablet Take 3 tablets (60 mg total) by mouth daily. 06/23/17   Strader, Fransisco Hertz, PA-C  triamcinolone cream (KENALOG) 0.1 % Apply 1 application topically 2 (two) times daily as needed (rash/itching).    [provider]  venlafaxine (EFFEXOR) 75 MG tablet TAKE 1 TABLET BY MOUTH IN THE EVENING FOR 7 DAYS THEN INCREASE TO 2 BY MOUTH EVERY EVENING 06/05/17   Claretta Fraise, MD    Family History Family History  Problem Relation Age of Onset  . Clotting disorder Mother   . Heart attack Mother   . Diabetes Father   . Heart attack Father   . Prostate cancer Brother   . Diabetes Brother        x 3  . Diabetes Sister        x 3  .  Diabetes Maternal Aunt     Social History Social History   Tobacco Use  . Smoking status: Former Smoker    Types: Cigarettes    Last attempt to quit: 07/13/1993    Years since quitting: 23.9  . Smokeless tobacco: Never Used  Substance Use Topics  . Alcohol use: Yes    Alcohol/week: 0.0 oz    Comment: socially  . Drug use: No     Allergies   Nsaids and Pregabalin   Review of Systems Review of Systems  Constitutional: Positive for activity change, appetite change and fatigue.  HENT: Negative for congestion and rhinorrhea.   Respiratory: Negative for cough, chest tightness and shortness of breath.   Cardiovascular: Negative for chest pain.  Gastrointestinal: Negative for abdominal pain, nausea and vomiting.  Genitourinary: Negative for dysuria, hematuria and testicular pain.  Musculoskeletal: Positive for arthralgias and myalgias.  Skin: Negative for rash.  Neurological: Positive for weakness and light-headedness.   all other systems are negative except as noted in the HPI and PMH.     Physical Exam Updated Vital Signs BP 106/63   Pulse 96   Temp 98.4 F (36.9 C) (Oral)   Resp 20   Ht 5\' 6"  (1.676 m)   Wt 93 kg (205 lb)   SpO2 98%   BMI 33.09 kg/m   Physical Exam  Constitutional: He is oriented to person, place, and time. He appears well-developed and well-nourished. No distress.  Fatigued appearing  HENT:  Head: Normocephalic and atraumatic.  Mouth/Throat: Oropharynx is clear and moist. No oropharyngeal exudate.  Eyes: Pupils are equal, round, and reactive to light. Conjunctivae and EOM are normal.  Neck: Normal range of motion. Neck supple.  No meningismus.  Cardiovascular: Normal rate, regular rhythm, normal heart sounds and intact distal pulses.  No murmur heard. Pulmonary/Chest: Effort normal and breath sounds normal. No respiratory distress. He exhibits tenderness.  Diffuse tenderness to palpation reproduces patient's pain  Abdominal: Soft. There is  no tenderness. There is no rebound and no guarding.  Musculoskeletal: Normal range of motion. He exhibits edema. He exhibits no tenderness.  +1 edema to knees  Neurological: He is alert and oriented to person, place, and time. No cranial nerve deficit. He exhibits  normal muscle tone. Coordination normal.  No ataxia on finger to nose bilaterally. No pronator drift. 5/5 strength throughout. CN 2-12 intact.Equal grip strength. Sensation intact.   Skin: Skin is warm.  Psychiatric: He has a normal mood and affect. His behavior is normal.  Nursing note and vitals reviewed.    ED Treatments / Results  Labs (all labs ordered are listed, but only abnormal results are displayed) Labs Reviewed  CBC WITH DIFFERENTIAL/PLATELET - Abnormal; Notable for the following components:      Result Value   RBC 3.29 (*)    Hemoglobin 11.3 (*)    HCT 35.6 (*)    MCV 108.2 (*)    MCH 34.3 (*)    RDW 16.6 (*)    All other components within normal limits  COMPREHENSIVE METABOLIC PANEL - Abnormal; Notable for the following components:   Chloride 91 (*)    CO2 37 (*)    Glucose, Bld 140 (*)    BUN 27 (*)    All other components within normal limits  TROPONIN I - Abnormal; Notable for the following components:   Troponin I 0.03 (*)    All other components within normal limits  LIPASE, BLOOD  URINALYSIS, ROUTINE W REFLEX MICROSCOPIC  BRAIN NATRIURETIC PEPTIDE  PROTIME-INR  CORTISOL  VITAMIN B12  TROPONIN I  TROPONIN I  TROPONIN I    EKG EKG Interpretation  Date/Time:  Saturday June 24 2017 23:21:34 EDT Ventricular Rate:  98 PR Interval:    QRS Duration: 93 QT Interval:  374 QTC Calculation: 478 R Axis:   -6 Text Interpretation:  Sinus rhythm Borderline prolonged QT interval No significant change was found Confirmed by Ezequiel Essex [phone]) on 06/24/2017 11:28:39 PM   Radiology Dg Chest 2 View  Result Date: 06/25/2017 CLINICAL DATA:  Chest pain EXAM: CHEST - 2 VIEW COMPARISON:  Chest  radiograph 06/06/2017 FINDINGS: Multiple left chest cerclage wires. Power injectable right chest wall Port-A-Cath tip is in the right atrium. The port access needle has been removed. There is shallow lung inflation without focal airspace disease. No pulmonary edema, pleural effusion or pneumothorax. Cardiomediastinal contours are normal. IMPRESSION: No active cardiopulmonary disease. Electronically Signed   By: Ulyses Jarred M.D.   On: 06/25/2017 02:02    Procedures Procedures (including critical care time)  Medications Ordered in ED Medications - No data to display   Initial Impression / Assessment and Plan / ED Course  I have reviewed the triage vital signs and the nursing notes.  Pertinent labs & imaging results that were available during my care of the patient were reviewed by me and considered in my medical decision making (see chart for details).    Patient with complicated medical history presenting with chest pain and shortness of breath for the past 1 day.  His chest pain is atypical and worse with palpation.  EKG is unchanged.  Orthostatics are positive with heart rate going to 120 and blood pressure dropping to 80 with standing.  Patient will be given small fluid bolus.  Last echocardiogram showed diastolic dysfunction.  Chest x-ray negative.  Labs reassuring.  Creatinine is normal. Troponin minimally elevated as previously.  Patient able to ambulate but becomes quite fatigued and short of breath.  Becomes tachycardic and tachypneic.  Denies chest pain other than when it is palpated.  Pain seems atypical for ACS.  Patient states compliance with his Eliquis.  Doubt acute PE.  Given his ongoing orthostasis and dyspnea, will plan admission for further hydration  and evaluation.  Discussed with Dr. Myna Hidalgo.  Final Clinical Impressions(s) / ED Diagnoses   Final diagnoses:  Orthostatic hypotension  Atypical chest pain    ED Discharge Orders    None       Kodee Ravert, Annie Main,  MD 06/25/17 [id number]

## 2017-06-25 NOTE — H&P (Signed)
History and Physical    CHUN SELLEN SEG:[id number] DOB: February 22, 1956 DOA: 06/24/2017  PCP: Dettinger, Fransisca Kaufmann, MD   Patient coming from: Home   Chief Complaint: Lightheadedness, gen weakness   HPI: Derek Shepherd is a 62 y.o. 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  Patient was admitted to the hospital earlier this month with similar symptoms including presyncope, was found to have orthostatic hypotension, diuretic was reduced at discharge, he continued to have orthostasis when he followed up in the cardiology clinic a few days ago, and the diuretic dose was reduced further.  He continues to be generally weak and lightheaded, particularly when upright.  Denies headache, change in vision or hearing, or focal numbness or weakness.  Denies fevers or chills.  Reports that his chronic cough is unchanged.  ED Course: Upon arrival to the ED, patient is found to be afebrile, saturating well on room air, and with vitals otherwise stable.  There was a 27 mmHg drop in systolic blood pressure upon standing, as well as a 24 bpm increase in heart rate.  EKG features a sinus rhythm and chest x-ray is unremarkable.  Chemistry panel is notable for creatinine 1.24, similar to prior.  CBC features a stable anemia with hemoglobin of 11.3, but MCV is continued to increase, now 108.2.  Troponin is slightly elevated at 0.03 and BNP is normal.  Patient was given 500 cc normal saline x2 in the ED.  He  will be observed on the telemetry unit for ongoing evaluation and management of orthostatic hypotension.  Review of Systems:  All other systems reviewed and apart from HPI, are negative.  Past Medical History:  Diagnosis Date  . Adenomatous colon polyp    Tubular adenoma  . Anemia   .  Anxiety   . Arthritis   . Asthma   . Constipation   . COPD (chronic obstructive pulmonary disease) ([place])   . Depression   . Diverticulosis   . Gallstones   . GERD (gastroesophageal reflux disease)   . 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 History of bone marrow transplant ([place])   . History of pulmonary embolus (PE)    Recurrent  . Leukemia-lymphoma, T-cell, acute, HTLV-I-associated ([place])   . Type 2 diabetes mellitus ([place])     Past Surgical History:  Procedure Laterality Date  . BONE MARROW TRANSPLANT  2011  . CHOLECYSTECTOMY    . COLONOSCOPY W/ BIOPSIES    . EXPLORATORY LAPAROTOMY     1986. Had tractor flipped over while he was operating it.   Marland Kitchen HERNIA REPAIR     x 2  . LUNG SURGERY Left      reports that he quit smoking about 23 years ago. His smoking use included cigarettes. He has never used smokeless tobacco. He reports that he drinks alcohol. He reports that he does not use drugs.  Allergies  Allergen Reactions  . Nsaids Shortness Of Breath    Pt states he can take ibuprofen occasionally with no reaction  . Pregabalin Other (See Comments)    Fluid retention    Family History  Problem Relation Age of Onset  . Clotting disorder Mother   . Heart attack Mother   . Diabetes Father   . Heart attack Father   .  Prostate cancer Brother   . Diabetes Brother        x 3  . Diabetes Sister        x 3  . Diabetes Maternal Aunt      Prior to Admission medications   Medication Sig Start Date End Date Taking? Authorizing Provider  apixaban (ELIQUIS) 5 MG TABS tablet Take 5 mg by mouth 2 (two) times daily.   Yes [provider]  albuterol (PROAIR HFA) 108 (90 Base) MCG/ACT inhaler Inhale 2 puffs into the lungs daily.    [provider]  citalopram (CELEXA) 40 MG tablet TAKE 1 TABLET BY MOUTH ONCE DAILY. REDUCE TO ONE-HALF TABLET ONCE DAILY WHILE TAKING FLUCONAZOLE FOR 14 DAYS 06/20/17   Claretta Fraise, MD    clonazePAM (KLONOPIN) 0.5 MG tablet Take 1 mg by mouth at bedtime.     [provider]  folic acid (FOLVITE) 1 MG tablet TAKE 1 TABLET BY MOUTH ONCE DAILY Patient taking differently: TAKE 1 TABLET  (1 MG )BY MOUTH ONCE DAILY 12/16/16   Claretta Fraise, MD  glucose blood test strip Use as instructed 05/02/17   Claretta Fraise, MD  HYDROmorphone (DILAUDID) 4 MG tablet Take 4 mg by mouth 5 (five) times daily.    [provider]  INCRUSE ELLIPTA 62.5 MCG/INH AEPB INHALE 1 PUFF BY MOUTH ONCE DAILY 06/12/17   Claretta Fraise, MD  lactulose [facility]) 10 GM/15ML solution Take by mouth daily as needed (constipation).     [provider]  levalbuterol (XOPENEX) 1.25 MG/3ML nebulizer solution USE ONE VIAL IN NEBULIZER 4 TIMES DAILY Patient taking differently: USE ONE VIAL (0.125 MG)  IN NEBULIZER 2 TIMES DAILY 08/19/16   Claretta Fraise, MD  metFORMIN (GLUCOPHAGE) 500 MG tablet Take 1 tablet (500 mg total) by mouth 2 (two) times daily with a meal. 03/28/17   Claretta Fraise, MD  metoprolol tartrate (LOPRESSOR) 25 MG tablet Take 12.5 mg by mouth 2 (two) times daily.    [provider]  montelukast (SINGULAIR) 10 MG tablet Take 10 mg by mouth daily.    [provider]  montelukast (SINGULAIR) 10 MG tablet TAKE 1 TABLET BY MOUTH ONCE DAILY 06/20/17   Claretta Fraise, MD  Multiple Vitamin (MULTIVITAMIN WITH MINERALS) TABS tablet Take 1 tablet by mouth daily.    [provider]  nefazodone (SERZONE) 50 MG tablet Take 50 mg by mouth at bedtime. 01/04/17   [provider]  oxymetazoline (AFRIN) 0.05 % nasal spray Place 1 spray into both nostrils daily as needed (nose bleeds).    [provider]  pantoprazole (PROTONIX) 40 MG tablet Take 1 tablet (40 mg total) by mouth at bedtime. 03/28/17   Claretta Fraise, MD  Polyethyl Glycol-Propyl Glycol (SYSTANE) 0.4-0.3 % SOLN Place 1 drop into both eyes 5 (five) times daily as needed (for dry eyes).     [provider]  polyethylene glycol (MIRALAX / GLYCOLAX) packet Take 1 packet by mouth daily. 06/01/17   [provider]  potassium chloride (MICRO-K) 10 MEQ CR capsule TAKE THREE CAPSULES BY MOUTH TWICE DAILY 03/28/17   Claretta Fraise, MD  Probiotic Product (TRUBIOTICS) CAPS Take 1 capsule by mouth daily.    [provider]  rOPINIRole (REQUIP) 3 MG tablet TAKE 1 TABLET BY MOUTH ONCE DAILY AT BEDTIME 04/27/17   Claretta Fraise, MD  sulfamethoxazole-trimethoprim (BACTRIM DS,SEPTRA DS) 800-160 MG tablet Take 1 tablet by mouth every Monday, Wednesday, and Friday.    [provider]  [facility]  160-4.5 MCG/ACT inhaler INHALE TWO PUFFS BY MOUTH ONCE DAILY IN THE MORNING Patient taking differently: INHALE TWO PUFFS BY MOUTH TWICE DAILY 08/19/16   Claretta Fraise, MD  tiotropium (SPIRIVA HANDIHALER) 18 MCG inhalation capsule Place 1 capsule (18 mcg total) daily into inhaler and inhale. 01/17/17   Claretta Fraise, MD  torsemide (DEMADEX) 20 MG tablet Take 3 tablets (60 mg total) by mouth daily. 06/23/17   Strader, Fransisco Hertz, PA-C  triamcinolone cream (KENALOG) 0.1 % Apply 1 application topically 2 (two) times daily as needed (rash/itching).    [provider]  venlafaxine (EFFEXOR) 75 MG tablet TAKE 1 TABLET BY MOUTH IN THE EVENING FOR 7 DAYS THEN INCREASE TO 2 BY MOUTH EVERY EVENING 06/05/17   Claretta Fraise, MD    Physical Exam: Vitals:   06/25/17 0130 06/25/17 0210 06/25/17 0213 06/25/17 0230  BP: 124/76 (!) 121/105 110/75 123/72  Pulse: 95  (!) 123 89  Resp: 18  (!) 25 20  Temp:      TempSrc:      SpO2: 95%  95% 96%  Weight:      Height:          Constitutional: NAD, calm, comfortable, chronically-ill appearing Eyes: PERTLA, lids and conjunctivae normal ENMT: Mucous membranes are moist. Posterior pharynx clear of any exudate or lesions.   Neck: normal, supple, no masses, no thyromegaly Respiratory: clear to auscultation bilaterally, no wheezing, no crackles.  Normal respiratory effort.    Cardiovascular: Rate ~110 and regular. No significant murmur appreciated. 1+ pretibial edema bilaterally. No significant JVD. Abdomen: No distension, no tenderness, no masses palpated. Bowel sounds normal.  Musculoskeletal: no clubbing / cyanosis. No joint deformity upper and lower extremities. Normal muscle tone.  Skin: no significant rashes, lesions, ulcers. Warm, dry, well-perfused.   Neurologic: CN 2-12 grossly intact. Sensation intact. Strength 5/5 in all 4 limbs.  Psychiatric: Alert and oriented x 3.Pleasant and cooperative.       Labs on Admission: I have personally reviewed following labs and imaging studies  CBC: Recent Labs  Lab 06/25/17 0002  WBC 9.5  NEUTROABS 7.3  HGB 11.3*  HCT 35.6*  MCV 108.2*  PLT 101   Basic Metabolic Panel: Recent Labs  Lab 06/25/17 0002  NA 143  K 4.6  CL 91*  CO2 37*  GLUCOSE 140*  BUN 27*  CREATININE 1.24  CALCIUM 9.7   GFR: Estimated Creatinine Clearance: 68.5 mL/min (by C-G formula based on SCr of 1.24 mg/dL). Liver Function Tests: Recent Labs  Lab 06/25/17 0002  AST 22  ALT 28  ALKPHOS 52  BILITOT 1.0  PROT 6.7  ALBUMIN 4.0   Recent Labs  Lab 06/25/17 0002  LIPASE 29   No results for input(s): AMMONIA in the last 168 hours. Coagulation Profile: Recent Labs  Lab 06/25/17 0003  INR 0.99   Cardiac Enzymes: Recent Labs  Lab 06/25/17 0002  TROPONINI 0.03*   BNP (last 3 results) No results for input(s): PROBNP in the last 8760 hours. HbA1C: No results for input(s): HGBA1C in the last 72 hours. CBG: No results for input(s): GLUCAP in the last 168 hours. Lipid Profile: No results for input(s): CHOL, HDL, LDLCALC, TRIG, CHOLHDL, LDLDIRECT in the last 72 hours. Thyroid Function Tests: No results for input(s): TSH, T4TOTAL, FREET4, T3FREE, THYROIDAB in the last 72 hours. Anemia Panel: No results for input(s): VITAMINB12, FOLATE, FERRITIN, TIBC, IRON, RETICCTPCT in the last 72  hours. Urine analysis:    Component Value Date/Time   COLORURINE  YELLOW 06/24/2017 2323   APPEARANCEUR CLEAR 06/24/2017 2323   APPEARANCEUR Clear 05/31/2016 1431   LABSPEC 1.012 06/24/2017 2323   PHURINE 6.0 06/24/2017 2323   GLUCOSEU NEGATIVE 06/24/2017 2323   HGBUR NEGATIVE 06/24/2017 2323   BILIRUBINUR NEGATIVE 06/24/2017 2323   BILIRUBINUR Negative 05/31/2016 1431   KETONESUR NEGATIVE 06/24/2017 2323   PROTEINUR NEGATIVE 06/24/2017 2323   UROBILINOGEN 1.0 07/18/2009 1936   NITRITE NEGATIVE 06/24/2017 2323   LEUKOCYTESUR NEGATIVE 06/24/2017 2323   LEUKOCYTESUR Negative 05/31/2016 1431   Sepsis Labs: @LABRCNTIP (procalcitonin:4,lacticidven:4) )No results found for this or any previous visit (from the past 240 hour(s)).   Radiological Exams on Admission: Dg Chest 2 View  Result Date: 06/25/2017 CLINICAL DATA:  Chest pain EXAM: CHEST - 2 VIEW COMPARISON:  Chest radiograph 06/06/2017 FINDINGS: Multiple left chest cerclage wires. Power injectable right chest wall Port-A-Cath tip is in the right atrium. The port access needle has been removed. There is shallow lung inflation without focal airspace disease. No pulmonary edema, pleural effusion or pneumothorax. Cardiomediastinal contours are normal. IMPRESSION: No active cardiopulmonary disease. Electronically Signed   By: Ulyses Jarred M.D.   On: 06/25/2017 02:02    EKG: Independently reviewed. Sinus rhythm.   Assessment/Plan   1. Orthostatic hypotension; presyncope  - Presents with lightheadedness, generalized weakness  - Found to have orthostatic hypotension despite recent reduction in diuretic dose x2  - Dehydration may be contributing, but does not seem to be improving with reductions in diuretic  - Echo done earlier this month with preserved EF  - Continue gentle IVF hydration, repeat orthostatic vitals in am, consider compression stockings, abdominal binder, consider fludrocortisone    2. Chronic diastolic CHF  - Appears  well-compensated  - Diuretics held on admission while hydrating gently in setting of orthostatic hypotension  - Follow I/O's and daily wts, hold Lopressor until BP improves    3. Type II DM  - A1c was 6.3% earlier this month  - Managed with metformin at home, held on admission  - Check CBG's and use SSI with Novolog while in hospital   4. Chronic PE  - No evidence for acute VTE  - Continue Eliquis    5. Chronic pain - No pain complaints on admission  - Continue home-regimen with oral Dilaudid    6. COPD; chronic hypoxic respiratory failure  - No wheezing of dyspnea on admission  - Continue ICS/LABA, Incruse, Spiriva, and prn nebs   - Continue supplemental O2   7. Depression with anxiety  - Stable  - Continue Effexor and Celexa    8. Macrocytic anemia  - Hgb is stable at 11.3, but MCV continues to increase, now 108.2  - Continue folate supplementation, check B12 level     DVT prophylaxis: Eliquis  Code Status: Full  Family Communication: Son updated at bedside Consults called: None Admission status: Observation    Vianne Bulls, MD [facility] Pager [phone]  If 7PM-7AM, please contact night-coverage [email or web address] Password [email or web address]  06/25/2017, 3:15 AM

## 2017-06-25 NOTE — Evaluation (Signed)
Physical Therapy Evaluation Patient Details Name: Derek Shepherd MRN: [id number] DOB: February 18, 1956 Today's Date: 06/25/2017   History of Present Illness  Derek Shepherd is a 62 y.o. 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  Patient was admitted to the hospital earlier this month with similar symptoms including presyncope, was found to have orthostatic hypotension, diuretic was reduced at discharge, he continued to have orthostasis when he followed up in the cardiology clinic a few days ago, and the diuretic dose was reduced further.  He continues to be generally weak and lightheaded, particularly when upright.  Denies headache, change in vision or hearing, or focal numbness or weakness.  Denies fevers or chills.  Reports that his chronic cough is unchanged.     Clinical Impression  Derek Shepherd is a 62 y.o. presenting for PT evaluation with recent decrease in functional mobility secondary to above episode and onset of generalized weakness. He is currently functioning slightly below his baseline of independent with no device, and now requires supervision for transfers/gait and is modified independent with bed mobility. He demonstrated good dynamic balance with gait close to baseline while managing portable O2 tank to ambulate with no device for ~ 60 feet. He has been educated on LE exercises to perform and on benefits of sitting upright during meals and mobilizing with RN to improve endurance. He has been given a goal to mobilize with RN 3x/day and has been instructed to call for assistance to ambulate to the bathroom or transfer form bed to chair to prevent falls due to line/tubes that need to be managed. He will benefit from further PT services in Acute setting to  address current impairments and continue gait and stair training to ensure safe discharge home. Acute Pt will follow.     Follow Up Recommendations No PT follow up    Equipment Recommendations  None recommended by PT    Recommendations for Other Services       Precautions / Restrictions Precautions Precautions: Fall Restrictions Weight Bearing Restrictions: No      Mobility  Bed Mobility Overal bed mobility: Modified Independent      General bed mobility comments: use of bed rail form flat bed, labored and slow movement  Transfers Overall transfer level: Modified independent    General transfer comment: slow and labored movement  Ambulation/Gait Ambulation/Gait assistance: Supervision Ambulation Distance (Feet): 60 Feet   Gait Pattern/deviations: Step-through pattern;Trunk flexed;Wide base of support;Decreased stride length   Gait velocity interpretation: 1.31 - 2.62 ft/sec, indicative of limited community ambulator General Gait Details: patient with some unsteadieness during turns and decreases hip/knee flexion throughout; mainained balance during gait while managing portable O2     Balance Overall balance assessment: Modified Independent Sitting-balance support: No upper extremity supported;Feet supported Sitting balance-Leahy Scale: Good    Standing balance-Leahy Scale: Good Standing balance comment: patient able to stand with no UE support and maintianed balance during gait while managing portable O2           Pertinent Vitals/Pain Pain Assessment: No/denies pain    Home Living Family/patient expects to be discharged to:: Private residence Living Arrangements: Spouse/significant other Available Help at Discharge: Family;Available 24 hours/day Type of Home: Mobile home Home Access: Stairs to enter Entrance Stairs-Rails: Right;Left Entrance Stairs-Number of Steps: 4 Home Layout: One level Home  Equipment: Gilford Rile - 2 wheels;Cane - single point;Shower  seat Additional Comments: O2 at home    Prior Function Level of Independence: Independent             Extremity/Trunk Assessment   Upper Extremity Assessment Upper Extremity Assessment: Overall WFL for tasks assessed    Lower Extremity Assessment Lower Extremity Assessment: Overall WFL for tasks assessed;LLE deficits/detail;RLE deficits/detail RLE Deficits / Details: 4/5 throughout with MMT in seated LLE Deficits / Details: 4/5 throughout with MMT in seated    Cervical / Trunk Assessment Cervical / Trunk Assessment: Kyphotic  Communication   Communication: No difficulties  Cognition Arousal/Alertness: Awake/alert Behavior During Therapy: WFL for tasks assessed/performed Overall Cognitive Status: Within Functional Limits for tasks assessed          Exercises General Exercises - Lower Extremity Long Arc Quad: Strengthening;Left;Right;5 reps Hip Flexion/Marching: Strengthening;Right;Left;10 reps   Assessment/Plan    PT Assessment Patient needs continued PT services  PT Problem List Decreased mobility;Decreased balance;Cardiopulmonary status limiting activity;Decreased activity tolerance       PT Treatment Interventions Gait training;Stair training;Functional mobility training;Therapeutic exercise;Patient/family education;Balance training;Therapeutic activities    PT Goals (Current goals can be found in the Care Plan section)  Acute Rehab PT Goals Patient Stated Goal: to improved endurance and strength to return home PT Goal Formulation: With patient Time For Goal Achievement: 07/02/17 Potential to Achieve Goals: Good    Frequency Min 3X/week    AM-PAC PT "6 Clicks" Daily Activity  Outcome Measure Difficulty turning over in bed (including adjusting bedclothes, sheets and blankets)?: A Little Difficulty moving from lying on back to sitting on the side of the bed? : A Little Difficulty sitting down on and standing up from a chair with arms (e.g., wheelchair,  bedside commode, etc,.)?: A Little Help needed moving to and from a bed to chair (including a wheelchair)?: A Little Help needed walking in hospital room?: A Little Help needed climbing 3-5 steps with a railing? : A Little 6 Click Score: 18    End of Session Equipment Utilized During Treatment: Gait belt Activity Tolerance: Patient tolerated treatment well Patient left: in chair;with call bell/phone within reach Nurse Communication: Mobility status PT Visit Diagnosis: Unsteadiness on feet (R26.81);Other abnormalities of gait and mobility (R26.89);Difficulty in walking, not elsewhere classified (R26.2)    Time: 8832-5498 PT Time Calculation (min) (ACUTE ONLY): 31 min   Charges:   PT Evaluation $PT Eval Low Complexity: 1 Low PT Treatments $Therapeutic Activity: 8-22 mins    Kipp Brood, PT, DPT Physical Therapist with [facility]  06/25/2017 4:52 PM

## 2017-06-25 NOTE — ED Notes (Signed)
Date and time results received: 06/25/17 01:37  Test: trop 0.03 Critical Value: trop 0.03  Name of Provider Notified: Dr Wyvonnia Dusky  Orders Received? Or Actions Taken?: no additional orders

## 2017-06-25 NOTE — ED Notes (Signed)
Patient transported to X-ray 

## 2017-06-26 DIAGNOSIS — D638 Anemia in other chronic diseases classified elsewhere: Secondary | ICD-10-CM | POA: Diagnosis not present

## 2017-06-26 DIAGNOSIS — J449 Chronic obstructive pulmonary disease, unspecified: Secondary | ICD-10-CM

## 2017-06-26 DIAGNOSIS — J9611 Chronic respiratory failure with hypoxia: Secondary | ICD-10-CM

## 2017-06-26 DIAGNOSIS — I5032 Chronic diastolic (congestive) heart failure: Secondary | ICD-10-CM

## 2017-06-26 DIAGNOSIS — C9101 Acute lymphoblastic leukemia, in remission: Secondary | ICD-10-CM | POA: Diagnosis not present

## 2017-06-26 DIAGNOSIS — E1165 Type 2 diabetes mellitus with hyperglycemia: Secondary | ICD-10-CM | POA: Diagnosis not present

## 2017-06-26 DIAGNOSIS — I2782 Chronic pulmonary embolism: Secondary | ICD-10-CM

## 2017-06-26 DIAGNOSIS — F418 Other specified anxiety disorders: Secondary | ICD-10-CM | POA: Diagnosis not present

## 2017-06-26 DIAGNOSIS — R69 Illness, unspecified: Secondary | ICD-10-CM | POA: Diagnosis not present

## 2017-06-26 DIAGNOSIS — I951 Orthostatic hypotension: Secondary | ICD-10-CM | POA: Diagnosis not present

## 2017-06-26 DIAGNOSIS — R0789 Other chest pain: Secondary | ICD-10-CM

## 2017-06-26 LAB — GLUCOSE, CAPILLARY
GLUCOSE-CAPILLARY: 127 mg/dL — AB (ref 65–99)
GLUCOSE-CAPILLARY: 190 mg/dL — AB (ref 65–99)
GLUCOSE-CAPILLARY: 135 mg/dL — AB (ref 65–99)
Glucose-Capillary: 123 mg/dL — ABNORMAL HIGH (ref 65–99)

## 2017-06-26 LAB — CORTISOL: Cortisol, Plasma: 11.4 ug/dL

## 2017-06-26 MED ORDER — SODIUM CHLORIDE 0.9 % IV SOLN
INTRAVENOUS | Status: AC
  Administered 2017-06-26 (×2): via INTRAVENOUS

## 2017-06-26 MED ORDER — DOCUSATE SODIUM 100 MG PO CAPS
100.0000 mg | ORAL_CAPSULE | Freq: Two times a day (BID) | ORAL | Status: DC
  Administered 2017-06-26 – 2017-06-27 (×2): 100 mg via ORAL
  Filled 2017-06-26 (×2): qty 1

## 2017-06-26 NOTE — Progress Notes (Signed)
Physical Therapy Treatment Patient Details Name: Derek Shepherd MRN: [id number] DOB: 02-Feb-1956 Today's Date: 06/26/2017    History of Present Illness Derek Shepherd is a 62 y.o. 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  Patient was admitted to the hospital earlier this month with similar symptoms including presyncope, was found to have orthostatic hypotension, diuretic was reduced at discharge, he continued to have orthostasis when he followed up in the cardiology clinic a few days ago, and the diuretic dose was reduced further.  He continues to be generally weak and lightheaded, particularly when upright.  Denies headache, change in vision or hearing, or focal numbness or weakness.  Denies fevers or chills.  Reports that his chronic cough is unchanged.    PT Comments    Patient demonstrates increased endurance/distance for gait training, but requires frequent standing rest breaks due to mild SOB and O2 desaturation down to 85% while on 2 LPM O2, required standing rest break for 2-3 minutes to bring O2 saturations above 95% before walking back to bedside and tolerated sitting up in chair after therapy.  Patient will benefit from continued physical therapy in hospital to increase strength, balance, endurance for safe ADLs and gait.   Follow Up Recommendations  No PT follow up     Equipment Recommendations  None recommended by PT    Recommendations for Other Services       Precautions / Restrictions Precautions Precautions: Fall Restrictions Weight Bearing Restrictions: No    Mobility  Bed Mobility Overal bed mobility: Modified Independent                Transfers Overall transfer level: Modified independent                   Ambulation/Gait Ambulation/Gait assistance: Supervision Ambulation Distance (Feet): 100 Feet Assistive device: None Gait Pattern/deviations: Decreased step length - right;Decreased step length - left;Decreased stride length Gait velocity: decreased   General Gait Details: slightly unsteady cadence without loss of balance, wider base of support, limited secondary to fatigue and O2 desaturation to 85%, able to increase O2 sats to 95% when taking standing rest breaks   Stairs Stairs: Yes Stairs assistance: Supervision Stair Management: One rail Right;Step to pattern Number of Stairs: 3 General stair comments: Patient demonstrates good return for going up/down 3 steps using 1 siderail without losing balance   Wheelchair Mobility    Modified Rankin (Stroke Patients Only)       Balance Overall balance assessment: No apparent balance deficits (not formally assessed)                                          Cognition Arousal/Alertness: Awake/alert Behavior During Therapy: WFL for tasks assessed/performed Overall Cognitive Status: Within Functional Limits for tasks assessed                                        Exercises      General Comments        Pertinent Vitals/Pain Pain Assessment: 0-10 Pain Score: 8  Pain Location: left foot Pain Descriptors / Indicators: Discomfort;Burning(nerve like) Pain  Intervention(s): Limited activity within patient's tolerance;Monitored during session    Home Living                      Prior Function            PT Goals (current goals can now be found in the care plan section) Acute Rehab PT Goals Patient Stated Goal: to improved endurance and strength to return home PT Goal Formulation: With patient Potential to Achieve Goals: Good Progress towards PT goals: Progressing toward goals    Frequency    Min 3X/week      PT Plan Current plan remains appropriate    Co-evaluation               AM-PAC PT "6 Clicks" Daily Activity  Outcome Measure  Difficulty turning over in bed (including adjusting bedclothes, sheets and blankets)?: None Difficulty moving from lying on back to sitting on the side of the bed? : None Difficulty sitting down on and standing up from a chair with arms (e.g., wheelchair, bedside commode, etc,.)?: None Help needed moving to and from a bed to chair (including a wheelchair)?: None Help needed walking in hospital room?: A Little Help needed climbing 3-5 steps with a railing? : A Little 6 Click Score: 22    End of Session   Activity Tolerance: Patient tolerated treatment well;Patient limited by fatigue Patient left: in chair;with call bell/phone within reach Nurse Communication: Mobility status PT Visit Diagnosis: Unsteadiness on feet (R26.81);Other abnormalities of gait and mobility (R26.89);Muscle weakness (generalized) (M62.81)     Time: 0240-9735 PT Time Calculation (min) (ACUTE ONLY): 24 min  Charges:  $Gait Training: 23-37 mins                    G Codes:       4:13 PM, 07-25-17 Lonell Grandchild, MPT Physical Therapist with [facility] [phone] office [phone] mobile phone

## 2017-06-26 NOTE — Care Management Obs Status (Signed)
[place] NOTIFICATION   Patient Details  Name: Derek Shepherd MRN: [id number] Date of Birth: April 19, 1955   Medicare Observation Status Notification Given:  Yes    Sherald Barge, RN 06/26/2017, 9:20 AM
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
## 2017-06-26 NOTE — Progress Notes (Signed)
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 Patient was hospitalized earlier this months with a similar presentation, including presyncope, was found to have orthostatic hypotension, and his diuretic was reduced at discharge. He continued to have orthostasis when he was followed by cardiology clinic and his diuretic was reduced further.   Denies headache, change in vision or hearing, focal numbness or weakness, fevers, chills, abdominal pain, melena, dysuria, hematuria, hematochezia  Assessment/Plan: Orthostatic hypotension; most likely due to diuretic use -Patient presented with lightheadedness and generalized weakness -Was found again to have orthostatic hypotension on 4/22 -Dehydration may be contributing to orthostasis, but does not seem to be improving with discontinued use of diuretics -Cortisol at 11.4 on 4/22 -Echo done earlier this month showed and EF of 55% -Will continue IV fluids for another 15 hours, repeat orthostatic vitals in am of 4/23 -Will place compression stockings as advised by Cardio Will continue to hold Torsemide -As per Dr. Domenic Polite, if he fails these measures, may need to consider a volume expander such as Florinef or midodrine.  Atypical Chest Pain -Patient reported pain along his precordium upon admission in the setting of lifting weights.  -Most likely musculoskeletal, since EKG shows no acute ischemic changes and cyclic troponins have been flat at 0.03, 0.04, 0.03, and <0.03. -Recent echo showed EF of 55% and no wall motion abnormalities -patient has remained now CP free  Chronic diastolic  heart failure -Appears well-compensated -Latest Echo EF at 55% earlier this month -Will continue hold Torsemide in the setting of orthostatic hypotension -Follow intake/outputs and daily weights -Hold Lopressor until BP improves  Hypertension -BP currently at 134/77 (while sitting) -Will continue to hold Lopressor and as mentioned above torsemide, in the setting of his orthostasis -follow VS  Type II Diabetes mellitus -CBG at 190 on 4/22 -A1c was 6.3% earlier this month -Will continue to hold metformin -Will continue SSI and Novolog while in hospital -Continue checking CBG's   Chronic PE -No evidence for acute VTE -Will continue Eliquis  Chronic pain -No pain complaints on admission -Continue home regimen with oral Dilaudid   COPD; with chronic hypoxic respiratory failure -No wheezing -Continue ICS/LABA, Incruse, Spiriva, and PRN nebulizers -Continue supplemental Oxygen  Depression with anxiety -Stable -Mood and affect appropriate. -no SI or hallucinations  -Continue Effexor and Celexa  Macrocytic anemia -Hgb is stable at 11.3, but MCV at 108 -B12 at 715 -Continue folate supplementation  Constipation -Patient reports that last BM was the day before admission -Continue Miralax  -Continue Milk of magnesia PRN -will add colace BID as well  GERD -Will continue Protonix   DVT prophylaxis: Eliquis Code Status: FULL Family Communication: None at bedside Disposition Plan: Patient will be discharged when orthostatics are stable and cleared by cardiology.   Consultants:  Cardiology  Procedures:  Chest X-ray  See results below  Antibiotics:  Sulfamethoxazole-trimethoprim 4/22 (LL prophylaxis)  HPI/Subjective: Patient complaining of abdominal soarness probably muscular due to the fact he started exercising las week. Says he did not feel dizziness today despite his orthostatics still being positive. Patient reports his last BM was 3 days ago; no nausea or  vomiting.   Objective: Vitals:  06/26/17 0510 06/26/17 0744  BP: 134/77   Pulse: 93   Resp:    Temp: 98.2 F (36.8 C)   SpO2: 93% 92%    Intake/Output Summary (Last 24 hours) at 06/26/2017 1115 Last data filed at 06/26/2017 0900 Gross per 24 hour  Intake 903 ml  Output 350 ml  Net 553 ml   Filed Weights   06/24/17 2309 06/25/17 0439 06/26/17 0510  Weight: 93 kg (205 lb) 90.9 kg (200 lb 6.4 oz) 90.7 kg (199 lb 15.3 oz)    Exam:   General:  Afebrile. Awake, alert, oriented x 3. Chronically-ill appearing. Patient is saturating 92% oxygen on  2 L nasal cannula. Patient appears SOB and unable to speak in full sentences (which appears to be at baseline) and he is still orthostatic.  Cardiovascular: RRR. S1 and S2 heard. No murmurs, gallops or rubs, no JVD  Respiratory: Clear to auscultation bilaterally. No use of accessory muscles. No wheezing, crackles or rubs  Abdomen: General soarness, most likelymuscular. No guarding or tenderness. Non distended. No organomegaly or masses felt. Positive BS x 4  Musculoskeletal: Bilateral 1+ pretibial edema of lower limbs. No clubbing or cyanosis. Pulses full throughout  Psychiatric: Mood and affect appropriate. No hallucinations  Data Reviewed: Basic Metabolic Panel: Recent Labs  Lab 06/25/17 0002  NA 143  K 4.6  CL 91*  CO2 37*  GLUCOSE 140*  BUN 27*  CREATININE 1.24  CALCIUM 9.7   Liver Function Tests: Recent Labs  Lab 06/25/17 0002  AST 22  ALT 28  ALKPHOS 52  BILITOT 1.0  PROT 6.7  ALBUMIN 4.0   Recent Labs  Lab 06/25/17 0002  LIPASE 29   CBC: Recent Labs  Lab 06/25/17 0002  WBC 9.5  NEUTROABS 7.3  HGB 11.3*  HCT 35.6*  MCV 108.2*  PLT 229   Cardiac Enzymes: Recent Labs  Lab 06/25/17 0002 06/25/17 0341 06/25/17 0926 06/25/17 1611  TROPONINI 0.03* 0.04* 0.03* <0.03   BNP (last 3 results) Recent Labs    11/09/16 1156 01/01/17 2342 06/25/17 0003  BNP 35.1 140.6* 91.0    CBG: Recent Labs   Lab 06/25/17 0752 06/25/17 1128 06/25/17 1653 06/25/17 2116 06/26/17 0738  GLUCAP 91 120* 125* 139* 127*    Studies: Dg Chest 2 View  Result Date: 06/25/2017 CLINICAL DATA:  Chest pain EXAM: CHEST - 2 VIEW COMPARISON:  Chest radiograph 06/06/2017 FINDINGS: Multiple left chest cerclage wires. Power injectable right chest wall Port-A-Cath tip is in the right atrium. The port access needle has been removed. There is shallow lung inflation without focal airspace disease. No pulmonary edema, pleural effusion or pneumothorax. Cardiomediastinal contours are normal. IMPRESSION: No active cardiopulmonary disease. Electronically Signed   By: Ulyses Jarred M.D.   On: 06/25/2017 02:02    Scheduled Meds: . acidophilus  1 capsule Oral Daily  . apixaban  5 mg Oral BID  . citalopram  40 mg Oral Daily  . clonazePAM  1 mg Oral QHS  . folic acid  1 mg Oral Daily  . insulin aspart  0-5 Units Subcutaneous QHS  . insulin aspart  0-9 Units Subcutaneous TID WC  . mometasone-formoterol  2 puff Inhalation BID  . montelukast  10 mg Oral Daily  . multivitamin with minerals  1 tablet Oral Daily  . pantoprazole  40 mg Oral QHS  . polyethylene glycol  1 packet Oral Daily  . rOPINIRole  3 mg Oral QHS  . sodium chloride flush  3 mL  Intravenous Q12H  . sulfamethoxazole-trimethoprim  1 tablet Oral Q M,W,F  . tiotropium  18 mcg Inhalation Daily  . umeclidinium bromide  1 puff Inhalation Daily  . venlafaxine XR  75 mg Oral Daily   Continuous Infusions: . sodium chloride 75 mL/hr at 06/26/17 1104    Time spent: 30 minutes    [facility] Pager [phone]. If 7PM-7AM, please contact night-coverage at [email or web address], password [facility] 06/26/2017, 11:15 AM  LOS: 0 days

## 2017-06-27 DIAGNOSIS — C9101 Acute lymphoblastic leukemia, in remission: Secondary | ICD-10-CM

## 2017-06-27 DIAGNOSIS — D638 Anemia in other chronic diseases classified elsewhere: Secondary | ICD-10-CM | POA: Diagnosis not present

## 2017-06-27 DIAGNOSIS — I951 Orthostatic hypotension: Secondary | ICD-10-CM | POA: Diagnosis not present

## 2017-06-27 DIAGNOSIS — I2782 Chronic pulmonary embolism: Secondary | ICD-10-CM | POA: Diagnosis not present

## 2017-06-27 DIAGNOSIS — I5032 Chronic diastolic (congestive) heart failure: Secondary | ICD-10-CM | POA: Diagnosis not present

## 2017-06-27 DIAGNOSIS — R69 Illness, unspecified: Secondary | ICD-10-CM | POA: Diagnosis not present

## 2017-06-27 DIAGNOSIS — E1165 Type 2 diabetes mellitus with hyperglycemia: Secondary | ICD-10-CM | POA: Diagnosis not present

## 2017-06-27 DIAGNOSIS — R0789 Other chest pain: Secondary | ICD-10-CM | POA: Diagnosis not present

## 2017-06-27 DIAGNOSIS — J449 Chronic obstructive pulmonary disease, unspecified: Secondary | ICD-10-CM | POA: Diagnosis not present

## 2017-06-27 DIAGNOSIS — J9611 Chronic respiratory failure with hypoxia: Secondary | ICD-10-CM | POA: Diagnosis not present

## 2017-06-27 LAB — GLUCOSE, CAPILLARY
Glucose-Capillary: 84 mg/dL (ref 65–99)
Glucose-Capillary: 202 mg/dL — ABNORMAL HIGH (ref 65–99)

## 2017-06-27 LAB — BASIC METABOLIC PANEL
Anion gap: 8 (ref 5–15)
BUN: 11 mg/dL (ref 6–20)
CALCIUM: 9 mg/dL (ref 8.9–10.3)
CO2: 32 mmol/L (ref 22–32)
CREATININE: 0.89 mg/dL (ref 0.61–1.24)
Chloride: 100 mmol/L — ABNORMAL LOW (ref 101–111)
GFR calc non Af Amer: >60 mL/min (ref >60)
GLUCOSE: 106 mg/dL — AB (ref 65–99)
Potassium: 3.9 mmol/L (ref 3.5–5.1)
Sodium: 140 mmol/L (ref 135–145)

## 2017-06-27 MED ORDER — TORSEMIDE 20 MG PO TABS: ORAL_TABLET | ORAL | 3 refills | Status: DC

## 2017-06-27 MED ORDER — APIXABAN 5 MG PO TABS: 5.0000 mg | ORAL_TABLET | Freq: Two times a day (BID) | ORAL | Status: DC

## 2017-06-27 NOTE — Discharge Instructions (Signed)
HAVE PRIMARY DOCTOR ADJUST BP MEDICATION AS NEEDED  AND MONITOR VOLUME STATUS  SCHEDULE APPT WITH PRIMARY MD WITHIN A WEEK  HAVE LAB BMET FOR ELECTROLYTE AND RENAL FUNCTION

## 2017-06-27 NOTE — Care Management Note (Signed)
Case Management Note  Patient Details  Name: Derek Shepherd MRN: [id number] Date of Birth: Jun 24, 1955  Subjective/Objective:      Admitted with orthostatic hypotension. ind with ADL's has PCP transportation and insurance with drug coverage. pt has home O2 pta. PT recommends no HH f/u.               Action/Plan: DC home soon with self care. No CM needs noted at this time.   Expected Discharge Date:     06/27/2017             Expected Discharge Plan:  Home/Self Care  In-House Referral:  NA  Discharge planning Services  CM Consult  Post Acute Care Choice:  NA Choice offered to:  NA  Status of Service:  Completed, signed off   Sherald Barge, RN 06/27/2017, 9:32 AM

## 2017-06-27 NOTE — Progress Notes (Signed)
Dr [facility] consult reviewed, no additional recs at this time. Cardiology will sign off inpatient care. Agree with just prn torsemide, compression stockings. No plans for ischemic testing at this time. He is followed by cardiology at [facility] and will need to arrange f/u there.   Carlyle Dolly MD

## 2017-06-27 NOTE — Discharge Summary (Addendum)
Physician Discharge Summary  Derek Shepherd LPF:[id number] DOB: 1955/12/04 DOA: 06/24/2017  PCP: Dettinger, Fransisca Kaufmann, MD  Admit date: 06/24/2017 Discharge date: 06/27/2017  Time spent: 35 minutes  Recommendations for Outpatient Follow-up:  1. Reassess vital signs/blood pressure with further adjustment to antihypertensive regimen as needed 2. Repeat basic metabolic panel to follow electrolytes and renal function 3. Close follow-up the patient volume status.   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  Patient no longer orthostatic, denies lightheadedness, near syncope or dizziness.  Discharged home with instruction to follow-up with PCP in 10 days and outpatient follow-up with cardiology service.  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 Patient was hospitalized earlier this months with a similar presentation, including presyncope, was found to have orthostatic hypotension, and his diuretic was reduced at discharge. He continued to have orthostasis when he was followed by cardiology clinic and his diuretic was reduced further.   Denies headache, change in vision  or hearing, focal numbness or weakness, fevers, chills, abdominal pain, melena, dysuria, hematuria, hematochezia  Hospital Course:  Orthostatic hypotension; most likely due to diuretic use -Patient presented with lightheadedness and generalized weakness -advise to maintain adequate oral hydration  -Cortisol at 11.4 on 4/22 -Echo done earlier this month showed and EF of 55% -after fluid resuscitation no further orthostatic changes appreciated.  -Will place compression stockings as advised by Cardio -Will continue Torsemide PRN only at discharge. -As per Dr. Domenic Polite, if he fails these measures, may need to consider a volume expander such as Florinef or midodrine.  Atypical Chest Pain -Patient reported pain along his precordium upon admission in the setting of lifting weights.  -Most likely musculoskeletal, since EKG shows no acute ischemic changes and cyclic troponins have been flat at 0.03, 0.04, 0.03, and <0.03. -Recent echo showed EF of 55% and no wall motion abnormalities -patient has remained now CP free  Chronic diastolic heart failure -Appears well-compensated -Latest Echo EF at 55% earlier this month -Will continue hold Torsemide in the setting of orthostatic hypotension -Follow intake/outputs and daily weights -Hold Lopressor until BP improves  Hypertension -no further orthostasis appreciated -will resume lopressor, no torsemide  -follow VS  Type II Diabetes mellitus -CBG at 190 on 4/22 -A1c was 6.3% earlier this month -Will continue to hold metformin -Will continue SSI and Novolog while in hospital -Continue checking CBG's   Chronic PE -No evidence for acute VTE -patient has completed treatment with anticoagulation as previously decided by his pulmonologist. -continue outpatient follow up.   Chronic pain -No pain complaints on admission -Continue home regimen with oral Dilaudid   COPD; with chronic hypoxic respiratory failure -No wheezing -Continue  ICS/LABA, Incruse, Spiriva,  and PRN nebulizers -Continue chronic supplemental Oxygen  Depression with anxiety -Stable -Mood and affect appropriate. -no SI or hallucinations  -Continue Effexor and Celexa  Macrocytic anemia -Hgb is stable at 11.3, but MCV at 108 -B12 at 715 -Continue folate supplementation  Constipation -Continue Miralax  -Continue Milk of magnesia PRN -after given colace, had BM on 4/22  GERD -Will continue Protonix  Procedures:  See below for x-ray reports  Consultations:  Cardiology  Discharge Exam: Vitals:   06/27/17 0847 06/27/17 1434  BP:  136/70  Pulse:  80  Resp:  20  Temp:  98.2 F (36.8 C)  SpO2: 92% 99%    General:  Afebrile. Awake, alert, oriented x 3. Chronically-ill appearing. Patient is saturating 92% oxygen on chronic 2 L nasal cannula. Patient feeling much better today, denying lightheadedness, dizziness or near syncope. Not longer orthostatic and denying SOB.    Cardiovascular: RRR. S1 and S2 heard. No murmurs, gallops or rubs, no JVD  Respiratory: Clear to auscultation bilaterally. No use of accessory muscles. No wheezing, crackles or rubs  Abdomen: General soarness, most likely muscular. No guarding or tenderness on exam. Non distended. No organomegaly or masses felt. Positive BS x 4 (patient reported doing abd crunches at home prior to admission)  Musculoskeletal: Bilateral 1+ pretibial edema of lower limbs appreciated on exam. No clubbing or cyanosis. Pulses full throughout  Psychiatric: Mood and affect appropriate. No hallucinations   Discharge Instructions   Discharge Instructions    (HEART FAILURE PATIENTS) Call MD:  Anytime you have any of the following symptoms: 1) 3 pound weight gain in 24 hours or 5 pounds in 1 week 2) shortness of breath, with or without a dry hacking cough 3) swelling in the hands, feet or stomach 4) if you have to sleep on extra pillows at night in order to breathe.   Complete by:  As  directed    Diet - low sodium heart healthy   Complete by:  As directed    Discharge instructions   Complete by:  As directed    Occasions are prescribed Follow low-sodium diet (less than 2-2.5 g of sodium per day) Use compression stockings 12 hours on 12 hours off to assist controlling your blood pressure Arrange follow-up with PCP in 10 days Arrange follow-up with [facility]ology service at [facility] after discharge. Please check your weight on daily basis (pay attention to weight gain of more than 3 pounds overnight and/or more than 5 pounds in a week). Do no further take torsemide except as prescribed (as needed for weight gain or shortness of breath)     Allergies as of 06/27/2017      Reactions   Nsaids Shortness Of Breath   Pt states he can take ibuprofen occasionally with no reaction   Pregabalin Other (See Comments)   Fluid retention      Medication List    TAKE these medications   apixaban 5 MG Tabs tablet Commonly known as:  ELIQUIS Take 1 tablet (5 mg total) by mouth 2 (two) times daily.   azithromycin 250 MG tablet Commonly known as:  ZITHROMAX Take 250 mg by mouth daily.   citalopram 40 MG tablet Commonly known as:  CELEXA TAKE 1 TABLET BY MOUTH ONCE DAILY. REDUCE TO ONE-HALF TABLET ONCE DAILY WHILE TAKING FLUCONAZOLE FOR 14 DAYS   clonazePAM 0.5 MG tablet Commonly known as:  KLONOPIN Take 1 mg by mouth at bedtime.   folic acid 1 MG tablet Commonly known as:  [employer]  TAKE 1 TABLET BY MOUTH ONCE DAILY What changed:    how much to take  how to take this  when to take this   HYDROmorphone 4 MG tablet Commonly known as:  DILAUDID Take 4 mg by mouth 5 (five) times daily.   INCRUSE ELLIPTA 62.5 MCG/INH Aepb Generic drug:  umeclidinium bromide INHALE 1 PUFF BY MOUTH ONCE DAILY   lactulose 10 GM/15ML solution Commonly known as:  CHRONULAC Take by mouth daily as needed (constipation).   levalbuterol 1.25 MG/3ML nebulizer solution Commonly known as:   XOPENEX USE ONE VIAL IN NEBULIZER 4 TIMES DAILY What changed:  See the new instructions.   metFORMIN 500 MG tablet Commonly known as:  GLUCOPHAGE Take 1 tablet (500 mg total) by mouth 2 (two) times daily with a meal. What changed:  how much to take   montelukast 10 MG tablet Commonly known as:  SINGULAIR TAKE 1 TABLET BY MOUTH ONCE DAILY   multivitamin with minerals Tabs tablet Take 1 tablet by mouth daily.   nefazodone 50 MG tablet Commonly known as:  SERZONE Take 50 mg by mouth at bedtime.   pantoprazole 40 MG tablet Commonly known as:  PROTONIX Take 1 tablet (40 mg total) by mouth at bedtime.   potassium chloride 10 MEQ CR capsule Commonly known as:  MICRO-K TAKE THREE CAPSULES BY MOUTH TWICE DAILY   PROAIR HFA 108 (90 Base) MCG/ACT inhaler Generic drug:  albuterol Inhale 2 puffs into the lungs daily.   ranitidine 150 MG tablet Commonly known as:  ZANTAC Take 150 mg by mouth 2 (two) times daily.   rOPINIRole 3 MG tablet Commonly known as:  REQUIP TAKE 1 TABLET BY MOUTH ONCE DAILY AT BEDTIME   sulfamethoxazole-trimethoprim 800-160 MG tablet Commonly known as:  BACTRIM DS,SEPTRA DS Take 1 tablet by mouth every Monday, Wednesday, and Friday.   SYMBICORT 160-4.5 MCG/ACT inhaler Generic drug:  budesonide-formoterol INHALE TWO PUFFS BY MOUTH ONCE DAILY IN THE MORNING What changed:  See the new instructions.   SYSTANE 0.4-0.3 % Soln Generic drug:  Polyethyl Glycol-Propyl Glycol Place 1 drop into both eyes 5 (five) times daily as needed (for dry eyes).   tiotropium 18 MCG inhalation capsule Commonly known as:  SPIRIVA HANDIHALER Place 1 capsule (18 mcg total) daily into inhaler and inhale.   torsemide 20 MG tablet Commonly known as:  DEMADEX 40 mg daily as needed for weight gain and SOB. What changed:    how much to take  how to take this  when to take this  additional instructions   triamcinolone cream 0.1 % Commonly known as:  KENALOG Apply 1  application topically 2 (two) times daily as needed (rash/itching).   TRUBIOTICS Caps Take 1 capsule by mouth daily.   venlafaxine 75 MG tablet Commonly known as:  EFFEXOR TAKE 1 TABLET BY MOUTH IN THE EVENING FOR 7 DAYS THEN INCREASE TO 2 BY MOUTH EVERY EVENING      Allergies  Allergen Reactions  . Nsaids Shortness Of Breath    Pt states he can take ibuprofen occasionally with no reaction  . Pregabalin Other (See Comments)    Fluid retention   Follow-up Information    Dettinger, Fransisca Kaufmann, MD. Schedule an appointment as soon as possible for a visit in 10 day(s).   Specialties:  Family Medicine, Cardiology Contact information: [place] [address] [phone]        Arnoldo Lenis, MD .   Specialty:  Cardiology Contact information:  [address] [place] [address] [phone]           The results of significant diagnostics from this hospitalization (including imaging, microbiology, ancillary and laboratory) are listed below for reference.    Significant Diagnostic Studies: Dg Chest 2 View  Result Date: 06/25/2017 CLINICAL DATA:  Chest pain EXAM: CHEST - 2 VIEW COMPARISON:  Chest radiograph 06/06/2017 FINDINGS: Multiple left chest cerclage wires. Power injectable right chest wall Port-A-Cath tip is in the right atrium. The port access needle has been removed. There is shallow lung inflation without focal airspace disease. No pulmonary edema, pleural effusion or pneumothorax. Cardiomediastinal contours are normal. IMPRESSION: No active cardiopulmonary disease. Electronically Signed   By: Ulyses Jarred M.D.   On: 06/25/2017 02:02   Dg Chest 2 View  Result Date: 06/06/2017 CLINICAL DATA:  Intermittent chest pain. EXAM: CHEST - 2 VIEW COMPARISON:  Chest x-ray dated January 29, 2017. FINDINGS: Interval replacement of the right chest wall port catheter, with the tip in the proximal right atrium. The heart size and mediastinal contours are within  normal limits. Normal pulmonary vascularity. Low lung volumes. No focal consolidation, pleural effusion, or pneumothorax. No acute osseous abnormality. Postsurgical changes along the left fifth and sixth ribs, similar to prior study. IMPRESSION: No active cardiopulmonary disease. Electronically Signed   By: Titus Dubin M.D.   On: 06/06/2017 18:23   Dg Elbow 2 Views Left  Result Date: 06/16/2017 CLINICAL DATA:  Elbow pain after fall. EXAM: LEFT ELBOW - 2 VIEW COMPARISON:  None. FINDINGS: There is no evidence of fracture, dislocation, or joint effusion. There is no evidence of arthropathy or other focal bone abnormality. Soft tissues are unremarkable. IMPRESSION: Negative. Electronically Signed   By: Titus Dubin M.D.   On: 06/16/2017 16:06   US Carotid Bilateral  Result Date: 06/07/2017 CLINICAL DATA:  Vertigo, pre syncopal episode, hypertension, hyperlipidemia EXAM: BILATERAL CAROTID DUPLEX ULTRASOUND TECHNIQUE: Pearline Cables scale imaging, color Doppler and duplex ultrasound were performed of bilateral carotid and vertebral arteries in the neck. COMPARISON:  None. FINDINGS: Criteria: Quantification of carotid stenosis is based on velocity parameters that correlate the residual internal carotid diameter with NASCET-based stenosis levels, using the diameter of the distal internal carotid lumen as the denominator for stenosis measurement. The following velocity measurements were obtained: RIGHT ICA:  84/27 cm/sec CCA:  26/71 cm/sec SYSTOLIC ICA/CCA RATIO:  1.3 DIASTOLIC ICA/CCA RATIO:  2.1 ECA:  67 cm/sec LEFT ICA:  56/18 cm/sec CCA:  24/58 cm/sec SYSTOLIC ICA/CCA RATIO:  0.7 DIASTOLIC ICA/CCA RATIO:  1.1 ECA:  71 cm/sec RIGHT CAROTID ARTERY: Minor echogenic shadowing plaque formation. No hemodynamically significant right ICA stenosis, velocity elevation, or turbulent flow. Degree of narrowing less than 50%. RIGHT VERTEBRAL ARTERY:  Antegrade LEFT CAROTID ARTERY: Similar scattered minor echogenic plaque  formation. No hemodynamically significant left ICA stenosis, velocity elevation, or turbulent flow. LEFT VERTEBRAL ARTERY:  Antegrade IMPRESSION: Minor carotid atherosclerosis. No hemodynamically significant ICA stenosis. Degree of narrowing less than 50% bilaterally by ultrasound criteria. Patent antegrade vertebral flow bilaterally Electronically Signed   By: Jerilynn Mages.  Shick M.D.   On: 06/07/2017 13:03   Dg Hip Unilat W Or W/o Pelvis 2-3 Views Left  Result Date: 06/16/2017 CLINICAL DATA:  Left hip pain and elbow pain after a fall. EXAM: DG HIP (WITH OR WITHOUT PELVIS) 2-3V LEFT COMPARISON:  None. FINDINGS: There is no evidence of hip fracture or dislocation. There is ankylosis of bilateral sacroiliac joints. There is no aggressive osseous lesion. There is  generalized osteopenia. IMPRESSION: No acute osseous injury of the left hip. Given the patient's age and osteopenia, if there is persistent clinical concern for an occult hip fracture, a CT or MRI of the hip is recommended for increased sensitivity. Electronically Signed   By: Kathreen Devoid   On: 06/16/2017 16:08   Labs: Basic Metabolic Panel: Recent Labs  Lab 06/25/17 0002 06/27/17 0658  NA 143 140  K 4.6 3.9  CL 91* 100*  CO2 37* 32  GLUCOSE 140* 106*  BUN 27* 11  CREATININE 1.24 0.89  CALCIUM 9.7 9.0   Liver Function Tests: Recent Labs  Lab 06/25/17 0002  AST 22  ALT 28  ALKPHOS 52  BILITOT 1.0  PROT 6.7  ALBUMIN 4.0   Recent Labs  Lab 06/25/17 0002  LIPASE 29   CBC: Recent Labs  Lab 06/25/17 0002  WBC 9.5  NEUTROABS 7.3  HGB 11.3*  HCT 35.6*  MCV 108.2*  PLT 229   Cardiac Enzymes: Recent Labs  Lab 06/25/17 0002 06/25/17 0341 06/25/17 0926 06/25/17 1611  TROPONINI 0.03* 0.04* 0.03* <0.03   BNP: BNP (last 3 results) Recent Labs    11/09/16 1156 01/01/17 2342 06/25/17 0003  BNP 35.1 140.6* 91.0    CBG: Recent Labs  Lab 06/26/17 1122 06/26/17 1618 06/26/17 2247 06/27/17 0728 06/27/17 1127  GLUCAP  190* 135* 123* 84 202*    Signed:  Barton Dubois MD.  [facility] 06/27/2017, 2:40 PM

## 2017-06-28 ENCOUNTER — Telehealth: Payer: Self-pay | Admitting: *Deleted

## 2017-06-28 ENCOUNTER — Other Ambulatory Visit: Payer: Self-pay | Admitting: Family Medicine

## 2017-06-28 NOTE — Telephone Encounter (Signed)
Breathing is a little worse.   Stopped fluid pills and thinks they stopped the prednisone.   Call Completed and Appointment Scheduled: Yes, Date: 06/30/2017 with Dr Livia Snellen    DISCHARGE INFORMATION Date of Discharge: 06/27/2017  Discharge Facility: [facility]  Principal Discharge Diagnosis:   Patient and/or caregiver is knowledgeable of his/her condition(s) and treatment: Yes  MEDICATION RECONCILIATION Current medication list reviewed with patient:Yes  Outpatient Encounter Medications as of 06/28/2017  Medication Sig  . albuterol (PROAIR HFA) 108 (90 Base) MCG/ACT inhaler Inhale 2 puffs into the lungs daily.  Marland Kitchen azithromycin (ZITHROMAX) 250 MG tablet Take 250 mg by mouth daily.  . citalopram (CELEXA) 40 MG tablet TAKE 1 TABLET BY MOUTH ONCE DAILY. REDUCE TO ONE-HALF TABLET ONCE DAILY WHILE TAKING FLUCONAZOLE FOR 14 DAYS  . citalopram (CELEXA) 40 MG tablet Take 1 tablet (40 mg total) by mouth daily.  . clonazePAM (KLONOPIN) 0.5 MG tablet Take 1 mg by mouth at bedtime.   . folic acid (FOLVITE) 1 MG tablet TAKE 1 TABLET BY MOUTH ONCE DAILY (Patient taking differently: TAKE 1 TABLET  (1 MG )BY MOUTH ONCE DAILY)  . HYDROmorphone (DILAUDID) 4 MG tablet Take 4 mg by mouth 5 (five) times daily.  . INCRUSE ELLIPTA 62.5 MCG/INH AEPB INHALE 1 PUFF BY MOUTH ONCE DAILY  . lactulose (CHRONULAC) 10 GM/15ML solution Take by mouth daily as needed (constipation).   Marland Kitchen levalbuterol (XOPENEX) 1.25 MG/3ML nebulizer solution USE ONE VIAL IN NEBULIZER 4 TIMES DAILY (Patient taking differently: USE ONE VIAL (0.125 MG)  IN NEBULIZER 2 TIMES DAILY)  . metFORMIN (GLUCOPHAGE) 500 MG tablet Take 1 tablet (500 mg total) by mouth 2 (two) times daily with a meal. (Patient taking differently: Take 1,000 mg by mouth 2 (two) times daily with a meal. )  . montelukast (SINGULAIR) 10 MG tablet TAKE 1 TABLET BY MOUTH ONCE DAILY  . Multiple Vitamin (MULTIVITAMIN WITH MINERALS) TABS tablet Take 1 tablet by mouth daily.  .  nefazodone (SERZONE) 50 MG tablet Take 50 mg by mouth at bedtime.  . pantoprazole (PROTONIX) 40 MG tablet Take 1 tablet (40 mg total) by mouth at bedtime.  Vladimir Faster Glycol-Propyl Glycol (SYSTANE) 0.4-0.3 % SOLN Place 1 drop into both eyes 5 (five) times daily as needed (for dry eyes).   . potassium chloride (MICRO-K) 10 MEQ CR capsule TAKE THREE CAPSULES BY MOUTH TWICE DAILY  . Probiotic Product (TRUBIOTICS) CAPS Take 1 capsule by mouth daily.  . ranitidine (ZANTAC) 150 MG tablet Take 150 mg by mouth 2 (two) times daily.  Marland Kitchen rOPINIRole (REQUIP) 3 MG tablet TAKE 1 TABLET BY MOUTH ONCE DAILY AT BEDTIME  . sulfamethoxazole-trimethoprim (BACTRIM DS,SEPTRA DS) 800-160 MG tablet Take 1 tablet by mouth every Monday, Wednesday, and Friday.  . SYMBICORT 160-4.5 MCG/ACT inhaler INHALE TWO PUFFS BY MOUTH ONCE DAILY IN THE MORNING (Patient taking differently: INHALE TWO PUFFS BY MOUTH TWICE DAILY)  . tiotropium (SPIRIVA HANDIHALER) 18 MCG inhalation capsule Place 1 capsule (18 mcg total) daily into inhaler and inhale.  . torsemide (DEMADEX) 20 MG tablet 40 mg daily as needed for weight gain and SOB.  Marland Kitchen triamcinolone cream (KENALOG) 0.1 % Apply 1 application topically 2 (two) times daily as needed (rash/itching).  . venlafaxine (EFFEXOR) 75 MG tablet TAKE 1 TABLET BY MOUTH IN THE EVENING FOR 7 DAYS THEN INCREASE TO 2 BY MOUTH EVERY EVENING   No facility-administered encounter medications on file as of 06/28/2017.     Discharge Medications reviewed and reconciled with  current medications.yes  Patient is able to obtain needed medications:Yes  ACTIVITIES OF DAILY LIVING  Is the patient able to perform his/her own ADLs: Yes.    Patient is receiving home health services: No.  PATIENT EDUCATION Questions/Concerns Discussed:

## 2017-06-29 DIAGNOSIS — Z79899 Other long term (current) drug therapy: Secondary | ICD-10-CM | POA: Diagnosis not present

## 2017-06-29 DIAGNOSIS — Z86711 Personal history of pulmonary embolism: Secondary | ICD-10-CM | POA: Diagnosis not present

## 2017-06-29 DIAGNOSIS — G4700 Insomnia, unspecified: Secondary | ICD-10-CM | POA: Diagnosis not present

## 2017-06-29 DIAGNOSIS — J9611 Chronic respiratory failure with hypoxia: Secondary | ICD-10-CM | POA: Diagnosis not present

## 2017-06-29 DIAGNOSIS — R61 Generalized hyperhidrosis: Secondary | ICD-10-CM | POA: Diagnosis not present

## 2017-06-29 DIAGNOSIS — D89811 Chronic graft-versus-host disease: Secondary | ICD-10-CM | POA: Diagnosis not present

## 2017-06-29 DIAGNOSIS — C9101 Acute lymphoblastic leukemia, in remission: Secondary | ICD-10-CM | POA: Diagnosis not present

## 2017-06-29 DIAGNOSIS — Z9484 Stem cells transplant status: Secondary | ICD-10-CM | POA: Diagnosis not present

## 2017-06-29 DIAGNOSIS — J42 Unspecified chronic bronchitis: Secondary | ICD-10-CM | POA: Diagnosis not present

## 2017-06-29 DIAGNOSIS — J449 Chronic obstructive pulmonary disease, unspecified: Secondary | ICD-10-CM | POA: Diagnosis not present

## 2017-06-29 DIAGNOSIS — R0602 Shortness of breath: Secondary | ICD-10-CM | POA: Diagnosis not present

## 2017-06-29 DIAGNOSIS — R69 Illness, unspecified: Secondary | ICD-10-CM | POA: Diagnosis not present

## 2017-06-29 DIAGNOSIS — R509 Fever, unspecified: Secondary | ICD-10-CM | POA: Diagnosis not present

## 2017-06-30 ENCOUNTER — Ambulatory Visit (INDEPENDENT_AMBULATORY_CARE_PROVIDER_SITE_OTHER): Payer: Medicare HMO | Admitting: Family Medicine

## 2017-06-30 ENCOUNTER — Encounter: Payer: Self-pay | Admitting: Family Medicine

## 2017-06-30 VITALS — BP 113/68 | HR 110 | Ht 66.0 in | Wt 208.0 lb

## 2017-06-30 DIAGNOSIS — H04123 Dry eye syndrome of bilateral lacrimal glands: Secondary | ICD-10-CM | POA: Diagnosis not present

## 2017-06-30 DIAGNOSIS — J479 Bronchiectasis, uncomplicated: Secondary | ICD-10-CM | POA: Diagnosis not present

## 2017-06-30 DIAGNOSIS — I951 Orthostatic hypotension: Secondary | ICD-10-CM

## 2017-06-30 DIAGNOSIS — Z9841 Cataract extraction status, right eye: Secondary | ICD-10-CM | POA: Diagnosis not present

## 2017-06-30 DIAGNOSIS — Z9842 Cataract extraction status, left eye: Secondary | ICD-10-CM | POA: Diagnosis not present

## 2017-06-30 DIAGNOSIS — H16121 Filamentary keratitis, right eye: Secondary | ICD-10-CM | POA: Diagnosis not present

## 2017-06-30 DIAGNOSIS — J449 Chronic obstructive pulmonary disease, unspecified: Secondary | ICD-10-CM | POA: Diagnosis not present

## 2017-06-30 DIAGNOSIS — Z961 Presence of intraocular lens: Secondary | ICD-10-CM | POA: Diagnosis not present

## 2017-06-30 DIAGNOSIS — D89813 Graft-versus-host disease, unspecified: Secondary | ICD-10-CM | POA: Diagnosis not present

## 2017-06-30 DIAGNOSIS — J9612 Chronic respiratory failure with hypercapnia: Secondary | ICD-10-CM | POA: Diagnosis not present

## 2017-06-30 DIAGNOSIS — J9611 Chronic respiratory failure with hypoxia: Secondary | ICD-10-CM

## 2017-06-30 NOTE — Patient Instructions (Signed)
Weigh Daily  If under 202 do not take Torsemide  If between 202-206 take 1/2   If over 206 take 1 torsemide.

## 2017-06-30 NOTE — Progress Notes (Signed)
Subjective:  Patient ID: Derek Shepherd, male    DOB: 1955/07/20  Age: 62 y.o. MRN: [id number]  CC: Hospitalization Follow-up (pt here today for hospital follow up)   HPI Derek Shepherd presents for hospital follow up. Was hydrated due to orthostasis earlier this week. Now needs help determining dose of diuretic.Pt. Had episode of near syncope and lightheadedness combined with atypical chest pain that led to hospitalization. Continues to have chronic respiratory failure with 02 dependence as well as chronic diastolic heart failure complicating fluid balance.   Depression screen [facility] 2/9 06/16/2017 06/06/2017 06/02/2017  Decreased Interest 0 0 1  Down, Depressed, Hopeless 0 0 0  PHQ - 2 Score 0 0 1  Altered sleeping - - -  Tired, decreased energy - - -  Change in appetite - - -  Feeling bad or failure about yourself  - - -  Trouble concentrating - - -  Moving slowly or fidgety/restless - - -  Suicidal thoughts - - -  PHQ-9 Score - - -  Difficult doing work/chores - - -  Some recent data might be hidden    History Derek Shepherd has a past medical history of Adenomatous colon polyp, Anemia, Anxiety, Arthritis, Asthma, Constipation, COPD (chronic obstructive pulmonary disease) ([place]), Depression, Diverticulosis, Gallstones, GERD (gastroesophageal reflux disease), 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   He has a past surgical history that includes Cholecystectomy; Lung surgery (Left); Exploratory laparotomy; Hernia repair; Colonoscopy w/ biopsies; and Bone marrow transplant (2011).   His family history includes Clotting disorder in his mother; Diabetes in his brother, father, maternal aunt, and sister; Heart attack in his father and mother; Prostate cancer in his brother.He reports that he  quit smoking about 23 years ago. His smoking use included cigarettes. He has never used smokeless tobacco. He reports that he drinks alcohol. He reports that he does not use drugs.    ROS Review of Systems  Constitutional: Positive for fatigue. Negative for activity change and fever.  HENT: Negative.   Respiratory: Positive for chest tightness and shortness of breath.   Cardiovascular: Negative for chest pain, palpitations and leg swelling.  Gastrointestinal: Negative for abdominal pain.  Musculoskeletal: Negative for arthralgias.  Skin: Negative for rash.  Neurological: Positive for weakness (generalized) and light-headedness.    Objective:  BP 113/68 Comment: standing  Pulse (!) 110   Ht 5\' 6"  (1.676 m)   Wt 208 lb (94.3 kg)   SpO2 92% Comment: on 2 liters oxygen [place]  BMI 33.57 kg/m   BP Readings from Last 3 Encounters:  06/30/17 113/68  06/27/17 136/70  06/23/17 130/82    Wt Readings from Last 3 Encounters:  06/30/17 208 lb (94.3 kg)  06/26/17 199 lb 15.3 oz (90.7 kg)  06/23/17 205 lb (93 kg)     Physical Exam  Constitutional: He is oriented to person, place, and time. He appears well-developed and well-nourished. No distress.  HENT:  Head: Normocephalic and atraumatic.  Eyes: Pupils are equal, round, and reactive to light. Conjunctivae and EOM are normal.  Neck: Normal range of motion. Neck supple. No JVD present. No thyromegaly present.  Cardiovascular: Normal rate and regular rhythm.  Pulmonary/Chest: He has wheezes. He has rales (both bases). He exhibits no tenderness.  Abdominal: Soft. There is no tenderness.  Musculoskeletal: Normal range of motion.  Neurological: He is  alert and oriented to person, place, and time.  Skin: Skin is warm and dry.      Assessment & Plan:   Derek Shepherd was seen today for hospitalization follow-up.  Diagnoses and all orders for this visit:  Chronic obstructive pulmonary disease, unspecified COPD type ([place])  Orthostatic  hypotension  Chronic respiratory failure with hypoxia and hypercapnia (HCC)  Bronchiectasis without complication (HCC)      Weigh Daily  If under 202 do not take Torsemide  If between 202-206 take 1/2   If over 206 take 1 torsemide. I am having Derek Shepherd. Derek Shepherd maintain his lactulose, multivitamin with minerals, Polyethyl Glycol-Propyl Glycol, SYMBICORT, levalbuterol, folic acid, tiotropium, clonazePAM, nefazodone, sulfamethoxazole-trimethoprim, TRUBIOTICS, PROAIR HFA, triamcinolone cream, HYDROmorphone, metFORMIN, pantoprazole, potassium chloride, rOPINIRole, venlafaxine, INCRUSE ELLIPTA, montelukast, citalopram, azithromycin, ranitidine, torsemide, and citalopram.  Allergies as of 06/30/2017      Reactions   Nsaids Shortness Of Breath   Pt states he can take ibuprofen occasionally with no reaction   Pregabalin Other (See Comments)   Fluid retention      Medication List        Accurate as of 06/30/17  9:53 PM. Always use your most recent med list.          azithromycin 250 MG tablet Commonly known as:  ZITHROMAX Take 250 mg by mouth daily.   citalopram 40 MG tablet Commonly known as:  CELEXA TAKE 1 TABLET BY MOUTH ONCE DAILY. REDUCE TO ONE-HALF TABLET ONCE DAILY WHILE TAKING FLUCONAZOLE FOR 14 DAYS   citalopram 40 MG tablet Commonly known as:  CELEXA Take 1 tablet (40 mg total) by mouth daily.   clonazePAM 0.5 MG tablet Commonly known as:  KLONOPIN Take 1 mg by mouth at bedtime.   folic acid 1 MG tablet Commonly known as:  FOLVITE TAKE 1 TABLET BY MOUTH ONCE DAILY   HYDROmorphone 4 MG tablet Commonly known as:  DILAUDID Take 4 mg by mouth 5 (five) times daily.   INCRUSE ELLIPTA 62.5 MCG/INH Aepb Generic drug:  umeclidinium bromide INHALE 1 PUFF BY MOUTH ONCE DAILY   lactulose 10 GM/15ML solution Commonly known as:  CHRONULAC Take by mouth daily as needed (constipation).   levalbuterol 1.25 MG/3ML nebulizer solution Commonly known as:  XOPENEX USE  ONE VIAL IN NEBULIZER 4 TIMES DAILY   metFORMIN 500 MG tablet Commonly known as:  GLUCOPHAGE Take 1 tablet (500 mg total) by mouth 2 (two) times daily with a meal.   montelukast 10 MG tablet Commonly known as:  SINGULAIR TAKE 1 TABLET BY MOUTH ONCE DAILY   multivitamin with minerals Tabs tablet Take 1 tablet by mouth daily.   nefazodone 50 MG tablet Commonly known as:  SERZONE Take 50 mg by mouth at bedtime.   pantoprazole 40 MG tablet Commonly known as:  PROTONIX Take 1 tablet (40 mg total) by mouth at bedtime.   potassium chloride 10 MEQ CR capsule Commonly known as:  MICRO-K TAKE THREE CAPSULES BY MOUTH TWICE DAILY   PROAIR HFA 108 (90 Base) MCG/ACT inhaler Generic drug:  albuterol Inhale 2 puffs into the lungs daily.   ranitidine 150 MG tablet Commonly known as:  ZANTAC Take 150 mg by mouth 2 (two) times daily.   rOPINIRole 3 MG tablet Commonly known as:  REQUIP TAKE 1 TABLET BY MOUTH ONCE DAILY AT BEDTIME   sulfamethoxazole-trimethoprim 800-160 MG tablet Commonly known as:  BACTRIM DS,SEPTRA DS Take 1 tablet by mouth every Monday, Wednesday, and Friday.   SYMBICORT 160-4.5 MCG/ACT  inhaler Generic drug:  budesonide-formoterol INHALE TWO PUFFS BY MOUTH ONCE DAILY IN THE MORNING   SYSTANE 0.4-0.3 % Soln Generic drug:  Polyethyl Glycol-Propyl Glycol Place 1 drop into both eyes 5 (five) times daily as needed (for dry eyes).   tiotropium 18 MCG inhalation capsule Commonly known as:  SPIRIVA HANDIHALER Place 1 capsule (18 mcg total) daily into inhaler and inhale.   torsemide 20 MG tablet Commonly known as:  DEMADEX 40 mg daily as needed for weight gain and SOB.   triamcinolone cream 0.1 % Commonly known as:  KENALOG Apply 1 application topically 2 (two) times daily as needed (rash/itching).   TRUBIOTICS Caps Take 1 capsule by mouth daily.   venlafaxine 75 MG tablet Commonly known as:  EFFEXOR TAKE 1 TABLET BY MOUTH IN THE EVENING FOR 7 DAYS THEN  INCREASE TO 2 BY MOUTH EVERY EVENING        Follow-up: Return in about 3 months (around 09/29/2017).  Claretta Fraise, M.D.

## 2017-07-04 DIAGNOSIS — R0902 Hypoxemia: Secondary | ICD-10-CM | POA: Diagnosis not present

## 2017-07-11 ENCOUNTER — Other Ambulatory Visit: Payer: Self-pay | Admitting: Family Medicine

## 2017-07-12 ENCOUNTER — Telehealth: Payer: Self-pay | Admitting: Family Medicine

## 2017-07-12 NOTE — Telephone Encounter (Signed)
Returning call.

## 2017-07-17 ENCOUNTER — Other Ambulatory Visit: Payer: Self-pay | Admitting: Family Medicine

## 2017-07-19 NOTE — Telephone Encounter (Signed)
Attempts have been made to return call with no call back. This encounter will be closed.

## 2017-07-22 ENCOUNTER — Other Ambulatory Visit: Payer: Self-pay

## 2017-07-22 ENCOUNTER — Ambulatory Visit (INDEPENDENT_AMBULATORY_CARE_PROVIDER_SITE_OTHER): Payer: Medicare HMO | Admitting: Nurse Practitioner

## 2017-07-22 ENCOUNTER — Emergency Department (HOSPITAL_COMMUNITY): Payer: Medicare HMO

## 2017-07-22 ENCOUNTER — Encounter: Payer: Self-pay | Admitting: Nurse Practitioner

## 2017-07-22 ENCOUNTER — Emergency Department (HOSPITAL_COMMUNITY)
Admission: EM | Admit: 2017-07-22 | Discharge: 2017-07-22 | Payer: Medicare HMO | Attending: Emergency Medicine | Admitting: Emergency Medicine

## 2017-07-22 ENCOUNTER — Encounter (HOSPITAL_COMMUNITY): Payer: Self-pay | Admitting: Emergency Medicine

## 2017-07-22 VITALS — BP 113/76 | HR 133 | Temp 100.5°F | Ht 66.0 in | Wt 208.0 lb

## 2017-07-22 DIAGNOSIS — Z79899 Other long term (current) drug therapy: Secondary | ICD-10-CM | POA: Insufficient documentation

## 2017-07-22 DIAGNOSIS — R000 Tachycardia, unspecified: Secondary | ICD-10-CM

## 2017-07-22 DIAGNOSIS — I248 Other forms of acute ischemic heart disease: Secondary | ICD-10-CM | POA: Diagnosis not present

## 2017-07-22 DIAGNOSIS — R5383 Other fatigue: Secondary | ICD-10-CM | POA: Diagnosis not present

## 2017-07-22 DIAGNOSIS — R109 Unspecified abdominal pain: Secondary | ICD-10-CM | POA: Diagnosis present

## 2017-07-22 DIAGNOSIS — I951 Orthostatic hypotension: Secondary | ICD-10-CM | POA: Diagnosis not present

## 2017-07-22 DIAGNOSIS — R0602 Shortness of breath: Secondary | ICD-10-CM | POA: Diagnosis not present

## 2017-07-22 DIAGNOSIS — R6510 Systemic inflammatory response syndrome (SIRS) of non-infectious origin without acute organ dysfunction: Secondary | ICD-10-CM | POA: Diagnosis not present

## 2017-07-22 DIAGNOSIS — J9610 Chronic respiratory failure, unspecified whether with hypoxia or hypercapnia: Secondary | ICD-10-CM | POA: Diagnosis not present

## 2017-07-22 DIAGNOSIS — R509 Fever, unspecified: Secondary | ICD-10-CM

## 2017-07-22 DIAGNOSIS — J8489 Other specified interstitial pulmonary diseases: Secondary | ICD-10-CM | POA: Diagnosis not present

## 2017-07-22 DIAGNOSIS — C9101 Acute lymphoblastic leukemia, in remission: Secondary | ICD-10-CM | POA: Diagnosis not present

## 2017-07-22 DIAGNOSIS — I10 Essential (primary) hypertension: Secondary | ICD-10-CM | POA: Diagnosis not present

## 2017-07-22 DIAGNOSIS — J449 Chronic obstructive pulmonary disease, unspecified: Secondary | ICD-10-CM | POA: Diagnosis not present

## 2017-07-22 DIAGNOSIS — I5032 Chronic diastolic (congestive) heart failure: Secondary | ICD-10-CM | POA: Diagnosis not present

## 2017-07-22 DIAGNOSIS — Z87891 Personal history of nicotine dependence: Secondary | ICD-10-CM | POA: Insufficient documentation

## 2017-07-22 DIAGNOSIS — R531 Weakness: Secondary | ICD-10-CM | POA: Diagnosis not present

## 2017-07-22 DIAGNOSIS — E860 Dehydration: Secondary | ICD-10-CM | POA: Diagnosis not present

## 2017-07-22 DIAGNOSIS — A419 Sepsis, unspecified organism: Secondary | ICD-10-CM | POA: Diagnosis not present

## 2017-07-22 DIAGNOSIS — E119 Type 2 diabetes mellitus without complications: Secondary | ICD-10-CM | POA: Insufficient documentation

## 2017-07-22 DIAGNOSIS — R0682 Tachypnea, not elsewhere classified: Secondary | ICD-10-CM | POA: Diagnosis not present

## 2017-07-22 DIAGNOSIS — Z9484 Stem cells transplant status: Secondary | ICD-10-CM | POA: Diagnosis not present

## 2017-07-22 DIAGNOSIS — E872 Acidosis: Secondary | ICD-10-CM | POA: Diagnosis not present

## 2017-07-22 DIAGNOSIS — J9612 Chronic respiratory failure with hypercapnia: Secondary | ICD-10-CM | POA: Diagnosis not present

## 2017-07-22 DIAGNOSIS — R42 Dizziness and giddiness: Secondary | ICD-10-CM | POA: Diagnosis not present

## 2017-07-22 DIAGNOSIS — I952 Hypotension due to drugs: Secondary | ICD-10-CM | POA: Diagnosis not present

## 2017-07-22 DIAGNOSIS — R9431 Abnormal electrocardiogram [ECG] [EKG]: Secondary | ICD-10-CM | POA: Diagnosis not present

## 2017-07-22 DIAGNOSIS — D89811 Chronic graft-versus-host disease: Secondary | ICD-10-CM | POA: Diagnosis not present

## 2017-07-22 DIAGNOSIS — Z66 Do not resuscitate: Secondary | ICD-10-CM | POA: Diagnosis not present

## 2017-07-22 DIAGNOSIS — R7989 Other specified abnormal findings of blood chemistry: Secondary | ICD-10-CM | POA: Diagnosis not present

## 2017-07-22 LAB — CBC WITH DIFFERENTIAL/PLATELET
BASOS PCT: 0 %
Basophils Absolute: 0 10*3/uL (ref 0.0–0.1)
Eosinophils Absolute: 0 10*3/uL (ref 0.0–0.7)
Eosinophils Relative: 0 %
HEMATOCRIT: 35.4 % — AB (ref 39.0–52.0)
HEMOGLOBIN: 11.3 g/dL — AB (ref 13.0–17.0)
LYMPHS ABS: 0.8 10*3/uL (ref 0.7–4.0)
LYMPHS PCT: 8 %
MCH: 34.1 pg — ABNORMAL HIGH (ref 26.0–34.0)
MCHC: 31.9 g/dL (ref 30.0–36.0)
MCV: 106.9 fL — AB (ref 78.0–100.0)
MONO ABS: 0.9 10*3/uL (ref 0.1–1.0)
MONOS PCT: 8 %
NEUTROS ABS: 8.7 10*3/uL — AB (ref 1.7–7.7)
NEUTROS PCT: 84 %
Platelets: 205 10*3/uL (ref 150–400)
RBC: 3.31 MIL/uL — ABNORMAL LOW (ref 4.22–5.81)
RDW: 17.6 % — AB (ref 11.5–15.5)
WBC: 10.4 10*3/uL (ref 4.0–10.5)

## 2017-07-22 LAB — BLOOD GAS, VENOUS
ACID-BASE EXCESS: 9.8 mmol/L — AB (ref 0.0–2.0)
BICARBONATE: 32.4 mmol/L — AB (ref 20.0–28.0)
O2 Saturation: 85.8 %
PCO2 VEN: 68.7 mmHg — AB (ref 44.0–60.0)
PO2 VEN: 59 mmHg — AB (ref 32.0–45.0)
Patient temperature: 37
pH, Ven: 7.336 (ref 7.250–7.430)

## 2017-07-22 LAB — COMPREHENSIVE METABOLIC PANEL
ALBUMIN: 4.1 g/dL (ref 3.5–5.0)
ALK PHOS: 45 U/L (ref 38–126)
ALT: 30 U/L (ref 17–63)
ANION GAP: 12 (ref 5–15)
AST: 27 U/L (ref 15–41)
BILIRUBIN TOTAL: 1.2 mg/dL (ref 0.3–1.2)
BUN: 26 mg/dL — AB (ref 6–20)
CALCIUM: 9.2 mg/dL (ref 8.9–10.3)
CO2: 38 mmol/L — ABNORMAL HIGH (ref 22–32)
Chloride: 86 mmol/L — ABNORMAL LOW (ref 101–111)
Creatinine, Ser: 1.03 mg/dL (ref 0.61–1.24)
GFR calc Af Amer: >60 mL/min (ref >60)
GLUCOSE: 194 mg/dL — AB (ref 65–99)
Potassium: 3.8 mmol/L (ref 3.5–5.1)
Sodium: 136 mmol/L (ref 135–145)
TOTAL PROTEIN: 6.7 g/dL (ref 6.5–8.1)

## 2017-07-22 LAB — URINALYSIS, ROUTINE W REFLEX MICROSCOPIC
BILIRUBIN URINE: NEGATIVE
Bacteria, UA: NONE SEEN
GLUCOSE, UA: NEGATIVE mg/dL
HGB URINE DIPSTICK: NEGATIVE
Ketones, ur: NEGATIVE mg/dL
Leukocytes, UA: NEGATIVE
NITRITE: NEGATIVE
Protein, ur: 30 mg/dL — AB
SPECIFIC GRAVITY, URINE: 1.005 (ref 1.005–1.030)
pH: 8 (ref 5.0–8.0)

## 2017-07-22 LAB — CBG MONITORING, ED: Glucose-Capillary: 149 mg/dL — ABNORMAL HIGH (ref 65–99)

## 2017-07-22 LAB — TROPONIN I
Troponin I: 0.06 ng/mL (ref <0.03)
Troponin I: 0.06 ng/mL (ref <0.03)

## 2017-07-22 LAB — D-DIMER, QUANTITATIVE: D-Dimer, Quant: 0.73 ug/mL-FEU — ABNORMAL HIGH (ref 0.00–0.50)

## 2017-07-22 LAB — LACTIC ACID, PLASMA
Lactic Acid, Venous: 3.3 mmol/L (ref 0.5–1.9)
Lactic Acid, Venous: 2.6 mmol/L (ref 0.5–1.9)

## 2017-07-22 LAB — LIPASE, BLOOD: Lipase: 30 U/L (ref 11–51)

## 2017-07-22 MED ORDER — ACETAMINOPHEN 500 MG PO TABS
1000.0000 mg | ORAL_TABLET | Freq: Once | ORAL | Status: AC
  Administered 2017-07-22: 1000 mg via ORAL
  Filled 2017-07-22: qty 2

## 2017-07-22 MED ORDER — VANCOMYCIN HCL 10 G IV SOLR
1500.0000 mg | Freq: Once | INTRAVENOUS | Status: AC
  Administered 2017-07-22: 1500 mg via INTRAVENOUS
  Filled 2017-07-22 (×2): qty 1500

## 2017-07-22 MED ORDER — SODIUM CHLORIDE 0.9 % IV BOLUS
1000.0000 mL | Freq: Once | INTRAVENOUS | Status: AC
  Administered 2017-07-22: 1000 mL via INTRAVENOUS

## 2017-07-22 MED ORDER — SODIUM CHLORIDE 0.9 % IV SOLN
2.0000 g | Freq: Once | INTRAVENOUS | Status: AC
  Administered 2017-07-22: 2 g via INTRAVENOUS
  Filled 2017-07-22: qty 2

## 2017-07-22 MED ORDER — SODIUM CHLORIDE 0.9 % IV SOLN
Freq: Once | INTRAVENOUS | Status: AC
  Administered 2017-07-22: 200 mL/h via INTRAVENOUS

## 2017-07-22 NOTE — ED Notes (Signed)
Date and time results received: 07/22/17 1133  Test: Lactic Acid Critical Value: 3.3  Name of Provider Notified: Lacinda Axon  Orders Received? Or Actions Taken?: No new orders at this time.

## 2017-07-22 NOTE — ED Notes (Signed)
Waiting for [facility] to deliver Vanc

## 2017-07-22 NOTE — ED Provider Notes (Signed)
8 PM this report the patient is confused. On exam alert Glasgow Coma Score 15 states he feels well. He is oriented to name and hospital month and year.  Patient is stable for transfer to [facility]. Dr. Florene Glen accepting physician Results for orders placed or performed during the hospital encounter of 07/22/17  Blood culture (routine x 2)  Result Value Ref Range   Specimen Description PORTA CATH BOTTLES DRAWN AEROBIC AND ANAEROBIC    Special Requests      Blood Culture adequate volume Performed at [facility], [address], [place], [place] [address]    Culture PENDING    Report Status PENDING   Blood culture (routine x 2)  Result Value Ref Range   Specimen Description      RIGHT ANTECUBITAL BOTTLES DRAWN AEROBIC AND ANAEROBIC   Special Requests      Blood Culture adequate volume Performed at [facility], [address] [place], [place] [address]    Culture PENDING    Report Status PENDING   CBC with Differential  Result Value Ref Range   WBC 10.4 4.0 - 10.5 K/uL   RBC 3.31 (L) 4.22 - 5.81 MIL/uL   Hemoglobin 11.3 (L) 13.0 - 17.0 g/dL   HCT 35.4 (L) 39.0 - 52.0 %   MCV 106.9 (H) 78.0 - 100.0 fL   MCH 34.1 (H) 26.0 - 34.0 pg   MCHC 31.9 30.0 - 36.0 g/dL   RDW 17.6 (H) 11.5 - 15.5 %   Platelets 205 150 - 400 K/uL   Neutrophils Relative % 84 %   Neutro Abs 8.7 (H) 1.7 - 7.7 K/uL   Lymphocytes Relative 8 %   Lymphs Abs 0.8 0.7 - 4.0 K/uL   Monocytes Relative 8 %   Monocytes Absolute 0.9 0.1 - 1.0 K/uL   Eosinophils Relative 0 %   Eosinophils Absolute 0.0 0.0 - 0.7 K/uL   Basophils Relative 0 %   Basophils Absolute 0.0 0.0 - 0.1 K/uL  Comprehensive metabolic panel  Result Value Ref Range   Sodium 136 135 - 145 mmol/L   Potassium 3.8 3.5 - 5.1 mmol/L   Chloride 86 (L) 101 - 111 mmol/L   CO2 38 (H) 22 - 32 mmol/L   Glucose, Bld 194 (H) 65 - 99 mg/dL   BUN 26 (H) 6 - 20 mg/dL   Creatinine, Ser 1.03 0.61 - 1.24 mg/dL   Calcium 9.2 8.9 - 10.3 mg/dL   Total Protein  6.7 6.5 - 8.1 g/dL   Albumin 4.1 3.5 - 5.0 g/dL   AST 27 15 - 41 U/L   ALT 30 17 - 63 U/L   Alkaline Phosphatase 45 38 - 126 U/L   Total Bilirubin 1.2 0.3 - 1.2 mg/dL   GFR calc non Af Amer >60 >60 mL/min   GFR calc Af Amer >60 >60 mL/min   Anion gap 12 5 - 15  Lipase, blood  Result Value Ref Range   Lipase 30 11 - 51 U/L  Urinalysis, Routine w reflex microscopic  Result Value Ref Range   Color, Urine STRAW (A) YELLOW   APPearance CLEAR CLEAR   Specific Gravity, Urine 1.005 1.005 - 1.030   pH 8.0 5.0 - 8.0   Glucose, UA NEGATIVE NEGATIVE mg/dL   Hgb urine dipstick NEGATIVE NEGATIVE   Bilirubin Urine NEGATIVE NEGATIVE   Ketones, ur NEGATIVE NEGATIVE mg/dL   Protein, ur 30 (A) NEGATIVE mg/dL   Nitrite NEGATIVE NEGATIVE   Leukocytes, UA NEGATIVE NEGATIVE  RBC / HPF 0-5 0 - 5 RBC/hpf   Bacteria, UA NONE SEEN NONE SEEN  Lactic acid, plasma  Result Value Ref Range   Lactic Acid, Venous 3.3 (HH) 0.5 - 1.9 mmol/L  Lactic acid, plasma  Result Value Ref Range   Lactic Acid, Venous 2.6 (HH) 0.5 - 1.9 mmol/L  Troponin I  Result Value Ref Range   Troponin I 0.06 (HH) <0.03 ng/mL  Troponin I  Result Value Ref Range   Troponin I 0.06 (HH) <0.03 ng/mL  D-dimer, quantitative (not at [facility])  Result Value Ref Range   D-Dimer, Quant 0.73 (H) 0.00 - 0.50 ug/mL-FEU  Blood gas, venous  Result Value Ref Range   pH, Ven 7.336 7.250 - 7.430   pCO2, Ven 68.7 (H) 44.0 - 60.0 mmHg   pO2, Ven 59.0 (H) 32.0 - 45.0 mmHg   Bicarbonate 32.4 (H) 20.0 - 28.0 mmol/L   Acid-Base Excess 9.8 (H) 0.0 - 2.0 mmol/L   O2 Saturation 85.8 %   Patient temperature 37.0    Collection site RIGHT ANTECUBITAL    Drawn by [place]    Sample type VENOUS   POC CBG, ED  Result Value Ref Range   Glucose-Capillary 149 (H) 65 - 99 mg/dL   Dg Chest 2 View  Result Date: 07/22/2017 CLINICAL DATA:  Shortness of breath, fever and tachycardia. EXAM: CHEST - 2 VIEW COMPARISON:  06/25/2017 and CT chest  01/29/2017. FINDINGS: Right IJ Port-A-Cath terminates at the [facility] RA junction or in the right atrium. Trachea is midline. Heart size within normal limits. Lungs are low in volume but clear. No pleural fluid. Postoperative changes are seen along the left chest wall. IMPRESSION: No acute findings. Electronically Signed   By: Lorin Picket M.D.   On: 07/22/2017 11:20   Dg Chest 2 View  Result Date: 06/25/2017 CLINICAL DATA:  Chest pain EXAM: CHEST - 2 VIEW COMPARISON:  Chest radiograph 06/06/2017 FINDINGS: Multiple left chest cerclage wires. Power injectable right chest wall Port-A-Cath tip is in the right atrium. The port access needle has been removed. There is shallow lung inflation without focal airspace disease. No pulmonary edema, pleural effusion or pneumothorax. Cardiomediastinal contours are normal. IMPRESSION: No active cardiopulmonary disease. Electronically Signed   By: Ulyses Jarred M.D.   On: 06/25/2017 02:02     Orlie Dakin, MD 07/22/17 2037

## 2017-07-22 NOTE — ED Notes (Signed)
Date and time results received: 07/22/17 1133  Test: Troponin Critical Value: 0.06  Name of Provider Notified: cook  Orders Received? Or Actions Taken?: No new orders at this time

## 2017-07-22 NOTE — Progress Notes (Signed)
Pharmacy Note:  Initial antibiotic(s) regimen of vancomycin and cefepime ordered by EDP to treat sepsis.  Estimated Creatinine Clearance: 83 mL/min (by C-G formula based on SCr of 1.03 mg/dL).   Allergies  Allergen Reactions  . Nsaids Shortness Of Breath    Pt states he can take ibuprofen occasionally with no reaction  . Pregabalin Other (See Comments)    Fluid retention    Vitals:   07/22/17 1530 07/22/17 1600  BP: (!) 155/92 (!) 138/103  Pulse: 90 (!) 106  Resp: (!) 21 18  Temp:    SpO2: 100% 99%    Anti-infectives (From admission, onward)   Start     Dose/Rate Route Frequency Ordered Stop   07/22/17 1745  vancomycin (VANCOCIN) 1,500 mg in sodium chloride 0.9 % 500 mL IVPB     1,500 mg 250 mL/hr over 120 Minutes Intravenous  Once 07/22/17 1646     07/22/17 1700  ceFEPIme (MAXIPIME) 2 g in sodium chloride 0.9 % 100 mL IVPB     2 g 200 mL/hr over 30 Minutes Intravenous  Once 07/22/17 1646       Plan: Initial dose(s) of vancomycin 1500mg  and cefepime 2gm  X 1 ordered. F/U admission orders for further dosing if therapy continued.  Ena Dawley, [facility] 07/22/2017 4:47 PM

## 2017-07-22 NOTE — Progress Notes (Signed)
   Subjective:    Patient ID: Derek Shepherd, male    DOB: 06-24-1955, 62 y.o.   MRN: [id number]   Chief Complaint: Fatigue  HPI Patient comes in today c/o fatigue. He recently got out of hospital for COPD, hypotension, and respiratory failure. He was seen by Dr. Livia Snellen for hospital follow up  The only change made was if weight was under 202 he was not to take his torsemide and if between 202-206 take 1/2 of torsemide. He started feeling fatigue for the last 3 days. He had a 3lb weight gain from yesterday morning to this morning. He is on bactrim on M,W and Friday and was recently started on zithromax by his lung specialist.    Review of Systems  Constitutional: Positive for appetite change (decreased), chills and fever.  HENT: Positive for congestion.   Respiratory: Positive for shortness of breath.   Cardiovascular: Positive for leg swelling.  Gastrointestinal: Negative.   Genitourinary: Negative.   Neurological: Negative.   Psychiatric/Behavioral: Negative.   All other systems reviewed and are negative.      Objective:   Physical Exam  Constitutional: He is oriented to person, place, and time. He appears well-developed and well-nourished. No distress.  HENT:  Right Ear: External ear normal.  Left Ear: External ear normal.  Nose: Nose normal.  Mouth/Throat: Oropharynx is clear and moist.  Eyes: Pupils are equal, round, and reactive to light.  Neck: Normal range of motion. Neck supple.  Cardiovascular:  tachycardia  Pulmonary/Chest:  Diminished breath sounds bil lower lobes O2 via nasal cannula at 2lL  Musculoskeletal: He exhibits edema (face and hands).  Lymphadenopathy:    He has no cervical adenopathy.  Neurological: He is alert and oriented to person, place, and time.  Skin: Skin is warm. There is pallor.    BP 113/76   Pulse (!) 133   Temp (!) 100.5 F (38.1 C) (Oral)   Ht 5\' 6"  (1.676 m)   Wt 208 lb (94.3 kg)   BMI 33.57 kg/m        Assessment &  Plan:   Derek Shepherd in today with chief complaint of fatigue and SOB  1. Tachycardia  2. Fatigue, unspecified type  3. SOB (shortness of breath)  4. Fever, unspecified fever cause  5. weakness  After speaking with wife and daughter in law, I do not have the ability to assess exactly what  is wrong with him today.  My recommendation is that he be taken to hospital, and [facility].  Family decided to have him go by EMS.  Mary-Margaret Hassell Done, FNP

## 2017-07-22 NOTE — ED Triage Notes (Signed)
Pt arrives EMS, from [place], SOB, fever, tachycardia

## 2017-07-22 NOTE — ED Notes (Addendum)
CRITICAL VALUE ALERT  Critical Value:  Lactic Acid-2.6  Date & Time Notied:  07/22/17 12:47  Provider Notified: Dr B. Cook  Orders Received/Actions taken: No new orders at this time

## 2017-07-22 NOTE — ED Provider Notes (Addendum)
[facility] EMERGENCY DEPARTMENT Provider Note   CSN: [id number] Arrival date & time: 07/22/17  8101     History   Chief Complaint Chief Complaint  Patient presents with  . Shortness of Breath    HPI Derek Shepherd is a 62 y.o. male.  Patient presents with a constellation of symptoms including abdominal pain, dizziness, dyspnea, fever, lightheaded with standing.  On home oxygen at 2 L/min.  He feels tired and sleepy.  He was seen at the primary care office today and referred to the emergency department.  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 Adenomatous colon polyp    Tubular adenoma  . Anemia   . Anxiety   . Arthritis   . Asthma   . Constipation   . COPD (chronic obstructive pulmonary disease) ([place])   . Depression   . Diverticulosis   . Gallstones   . GERD (gastroesophageal reflux disease)   . 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 History of bone marrow transplant ([place])   . History of pulmonary embolus (PE)    Recurrent  . Leukemia-lymphoma, T-cell, acute, HTLV-I-associated ([place])   . Type 2 diabetes mellitus [facility])     Patient Active Problem List   Diagnosis Date Noted  . Chest pain 06/06/2017  . History of syncope 06/06/2017  . Depression with anxiety 06/06/2017  . Orthostatic hypotension 01/29/2017  . Thrombocytopenia due to drugs 01/29/2017  . COPD (chronic obstructive pulmonary disease)/BOOP 01/29/2017  . Chronic respiratory failure with hypoxia and hypercapnia ([place]) 01/29/2017  . History of chronic pulmonary embolism 01/29/2017  . Chronic systolic heart failure ([place]) 01/29/2017  . Anemia 01/29/2017  . History of Leukemia-lymphoma, T-cell, acute, HTLV-I-associated (s/p BMT 2011) 01/29/2017  . Diabetes  mellitus type 2, uncontrolled ([place]) 01/29/2017  . Acute kidney injury ([place]) 01/29/2017  . Chronic hypercapnic respiratory failure ([place]) 01/23/2017  . Sepsis ([place]) 01/02/2017  . Fever 01/02/2017  . Bronchiectasis ([place]) 12/13/2016  . Symptomatic anemia 04/07/2016  . Hyponatremia 03/04/2016  . Panlobular emphysema ([place]) 12/22/2015  . Anemia of chronic disease 11/24/2015  . Chronic pulmonary embolism ([place]) 11/24/2015  . Thrombocytopenia ([place]) 11/24/2015  . Depression 11/24/2015  . Chronic diastolic CHF (congestive heart failure) ([place]) 11/24/2015  . Controlled type 2 diabetes mellitus without complication, without long-term current use of insulin ([place]) 11/24/2015  . History of bone marrow transplant ([place])   . ALL (acute lymphoid leukemia) in remission ([place]) 03/20/2013    Past Surgical History:  Procedure Laterality Date  . BONE MARROW TRANSPLANT  2011  . CHOLECYSTECTOMY    . COLONOSCOPY W/ BIOPSIES    . EXPLORATORY LAPAROTOMY     1986. Had tractor flipped over while he was operating it.   Marland Kitchen HERNIA REPAIR     x 2  . LUNG SURGERY Left         Home Medications    Prior to Admission medications   Medication Sig Start Date End Date Taking? Authorizing Provider  acyclovir (ZOVIRAX) 400 MG tablet Take 400 mg by mouth 2 (two) times daily.   Yes [provider]  albuterol (PROAIR HFA) 108 (90 Base) MCG/ACT inhaler Inhale 2 puffs into the lungs daily.   Yes [provider]  apixaban (ELIQUIS) 5 MG TABS tablet  Take 5 mg by mouth 2 (two) times daily.   Yes [provider]  citalopram (CELEXA) 40 MG tablet Take 1 tablet (40 mg total) by mouth daily. 06/28/17  Yes Dettinger, Fransisca Kaufmann, MD  clonazePAM (KLONOPIN) 0.5 MG tablet Take 1 mg by mouth at bedtime.    Yes [provider]  fluconazole (DIFLUCAN) 200 MG tablet Take 200 mg by mouth daily.   Yes [provider]  fluticasone (FLONASE) 50 MCG/ACT nasal spray USE ONE SPRAY(S) IN EACH NOSTRIL ONCE  DAILY 07/12/17  Yes Dettinger, Fransisca Kaufmann, MD  folic acid (FOLVITE) 1 MG tablet TAKE 1 TABLET BY MOUTH ONCE DAILY 07/17/17  Yes Claretta Fraise, MD  HYDROmorphone (DILAUDID) 4 MG tablet Take 4 mg by mouth 5 (five) times daily.   Yes [provider]  INCRUSE ELLIPTA 62.5 MCG/INH AEPB INHALE 1 PUFF BY MOUTH ONCE DAILY 06/12/17  Yes Stacks, Cletus Gash, MD  lactulose [facility]) 10 GM/15ML solution Take by mouth daily as needed (constipation).    Yes [provider]  levalbuterol (XOPENEX) 1.25 MG/3ML nebulizer solution USE ONE VIAL IN NEBULIZER 4 TIMES DAILY Patient taking differently: USE ONE VIAL (0.125 MG)  IN NEBULIZER 2 TIMES DAILY 08/19/16  Yes Claretta Fraise, MD  metFORMIN (GLUCOPHAGE) 500 MG tablet Take 1 tablet (500 mg total) by mouth 2 (two) times daily with a meal. 03/28/17  Yes Stacks, Cletus Gash, MD  montelukast (SINGULAIR) 10 MG tablet TAKE 1 TABLET BY MOUTH ONCE DAILY 06/20/17  Yes Claretta Fraise, MD  Multiple Vitamin (MULTIVITAMIN WITH MINERALS) TABS tablet Take 1 tablet by mouth daily.   Yes [provider]  nefazodone (SERZONE) 50 MG tablet Take 50 mg by mouth daily.  01/04/17  Yes [provider]  pantoprazole (PROTONIX) 40 MG tablet Take 1 tablet (40 mg total) by mouth at bedtime. Patient taking differently: Take 40 mg by mouth daily.  03/28/17  Yes Stacks, Cletus Gash, MD  Polyethyl Glycol-Propyl Glycol (SYSTANE) 0.4-0.3 % SOLN Place 1 drop into both eyes 5 (five) times daily as needed (for dry eyes).    Yes [provider]  potassium chloride (MICRO-K) 10 MEQ CR capsule TAKE THREE CAPSULES BY MOUTH TWICE DAILY 03/28/17  Yes Stacks, Cletus Gash, MD  predniSONE (DELTASONE) 10 MG tablet Take 10 mg by mouth 3 (three) times daily.   Yes [provider]  Probiotic Product (TRUBIOTICS) CAPS Take 1 capsule by mouth daily.   Yes [provider]  ranitidine (ZANTAC) 150 MG tablet Take 150 mg by mouth 2 (two) times daily.   Yes [provider]   rOPINIRole (REQUIP) 3 MG tablet TAKE 1 TABLET BY MOUTH ONCE DAILY AT BEDTIME 04/27/17  Yes Claretta Fraise, MD  sulfamethoxazole-trimethoprim (BACTRIM DS,SEPTRA DS) 800-160 MG tablet Take 1 tablet by mouth every Monday, Wednesday, and Friday.   Yes [provider]  SYMBICORT 160-4.5 MCG/ACT inhaler INHALE TWO PUFFS BY MOUTH ONCE DAILY IN THE MORNING Patient taking differently: INHALE TWO PUFFS BY MOUTH TWICE DAILY 08/19/16  Yes Claretta Fraise, MD  tiotropium (SPIRIVA HANDIHALER) 18 MCG inhalation capsule Place 1 capsule (18 mcg total) daily into inhaler and inhale. 01/17/17  Yes Claretta Fraise, MD  torsemide (DEMADEX) 100 MG tablet Take 50 mg by mouth daily.   Yes [provider]  triamcinolone cream (KENALOG) 0.1 % Apply 1 application topically 2 (two) times daily as needed (rash/itching).   Yes [provider]  venlafaxine (EFFEXOR) 75 MG tablet TAKE 1 TABLET BY MOUTH IN THE EVENING FOR 7 DAYS  THEN INCREASE TO 2 BY MOUTH EVERY EVENING 06/05/17  Yes Claretta Fraise, MD    Family History Family History  Problem Relation Age of Onset  . Clotting disorder Mother   . Heart attack Mother   . Diabetes Father   . Heart attack Father   . Prostate cancer Brother   . Diabetes Brother        x 3  . Diabetes Sister        x 3  . Diabetes Maternal Aunt     Social History Social History   Tobacco Use  . Smoking status: Former Smoker    Types: Cigarettes    Last attempt to quit: 07/13/1993    Years since quitting: 24.0  . Smokeless tobacco: Never Used  Substance Use Topics  . Alcohol use: Yes    Alcohol/week: 0.0 oz    Comment: socially  . Drug use: No     Allergies   Nsaids and Pregabalin   Review of Systems Review of Systems  All other systems reviewed and are negative.    Physical Exam Updated Vital Signs BP (!) 138/103   Pulse (!) 106   Temp 99.9 F (37.7 C) (Oral)   Resp 18   Ht 5\' 6"  (1.676 m)   Wt 94.3 kg (208 lb)   SpO2 99%   BMI 33.57  kg/m   Physical Exam  Constitutional: He is oriented to person, place, and time.  Alert, nad  HENT:  Head: Normocephalic and atraumatic.  Eyes: Conjunctivae are normal.  Neck: Neck supple.  Cardiovascular: Normal rate and regular rhythm.  Pulmonary/Chest: Effort normal and breath sounds normal.  Abdominal: Soft. Bowel sounds are normal.  Musculoskeletal: Normal range of motion.  Neurological: He is alert and oriented to person, place, and time.  Skin: Skin is warm and dry.  Psychiatric:  Flat affect  Nursing note and vitals reviewed.    ED Treatments / Results  Labs (all labs ordered are listed, but only abnormal results are displayed) Labs Reviewed  CBC WITH DIFFERENTIAL/PLATELET - Abnormal; Notable for the following components:      Result Value   RBC 3.31 (*)    Hemoglobin 11.3 (*)    HCT 35.4 (*)    MCV 106.9 (*)    MCH 34.1 (*)    RDW 17.6 (*)    Neutro Abs 8.7 (*)    All other components within normal limits  COMPREHENSIVE METABOLIC PANEL - Abnormal; Notable for the following components:   Chloride 86 (*)    CO2 38 (*)    Glucose, Bld 194 (*)    BUN 26 (*)    All other components within normal limits  URINALYSIS, ROUTINE W REFLEX MICROSCOPIC - Abnormal; Notable for the following components:   Color, Urine STRAW (*)    Protein, ur 30 (*)    All other components within normal limits  LACTIC ACID, PLASMA - Abnormal; Notable for the following components:   Lactic Acid, Venous 3.3 (*)    All other components within normal limits  LACTIC ACID, PLASMA - Abnormal; Notable for the following components:   Lactic Acid, Venous 2.6 (*)    All other components within normal limits  TROPONIN I - Abnormal; Notable for the following components:   Troponin I 0.06 (*)    All other components within normal limits  TROPONIN I - Abnormal; Notable for the following components:   Troponin I 0.06 (*)    All other components within normal limits  D-DIMER, QUANTITATIVE (NOT AT  [facility]) - Abnormal; Notable for the following components:   D-Dimer, Quant 0.73 (*)    All other components within normal limits  CULTURE, BLOOD (ROUTINE X 2)  CULTURE, BLOOD (ROUTINE X 2)  LIPASE, BLOOD  TROPONIN I    EKG EKG Interpretation  Date/Time:  Saturday Jul 22 2017 09:35:32 EDT Ventricular Rate:  115 PR Interval:    QRS Duration: 118 QT Interval:  355 QTC Calculation: 491 R Axis:   -13 Text Interpretation:  Sinus tachycardia Nonspecific intraventricular conduction delay Borderline low voltage, extremity leads Minimal ST depression, lateral leads Confirmed by Nat Christen [id number]) on 07/22/2017 10:14:39 AM   Radiology Dg Chest 2 View  Result Date: 07/22/2017 CLINICAL DATA:  Shortness of breath, fever and tachycardia. EXAM: CHEST - 2 VIEW COMPARISON:  06/25/2017 and CT chest 01/29/2017. FINDINGS: Right IJ Port-A-Cath terminates at the [facility] RA junction or in the right atrium. Trachea is midline. Heart size within normal limits. Lungs are low in volume but clear. No pleural fluid. Postoperative changes are seen along the left chest wall. IMPRESSION: No acute findings. Electronically Signed   By: Lorin Picket M.D.   On: 07/22/2017 11:20    Procedures Procedures (including critical care time)  Medications Ordered in ED Medications  sodium chloride 0.9 % bolus 1,000 mL (0 mLs Intravenous Stopped 07/22/17 1116)  sodium chloride 0.9 % bolus 1,000 mL (0 mLs Intravenous Stopped 07/22/17 1137)  acetaminophen (TYLENOL) tablet 1,000 mg (1,000 mg Oral Given 07/22/17 1022)     Initial Impression / Assessment and Plan / ED Course  I have reviewed the triage vital signs and the nursing notes.  Pertinent labs & imaging results that were available during my care of the patient were reviewed by me and considered in my medical decision making (see chart for details).     Patient presents with a constellation of symptoms including abdominal pain, dizziness, lightheadedness.  Lactate and  trop were initially elevated; however, white count, chest x-ray, urinalysis showed no acute findings.  Diagnosis of sepsis was delayed secondary to a confounding initial presentation. IV vancomycin and Maxipime initiated.   Discussed with hospitalist.  Will attempt to transfer to [facility].  1700: Discussed with Dr. Florene Glen (oncologist at [facility]).  He will accept transfer.  Patient is hemodynamically stable at this time.  CRITICAL CARE Performed by: Nat Christen Total critical care time: 40 minutes Critical care time was exclusive of separately billable procedures and treating other patients. Critical care was necessary to treat or prevent imminent or life-threatening deterioration. Critical care was time spent personally by me on the following activities: development of treatment plan with patient and/or surrogate as well as nursing, discussions with consultants, evaluation of patient's response to treatment, examination of patient, obtaining history from patient or surrogate, ordering and performing treatments and interventions, ordering and review of laboratory studies, ordering and review of radiographic studies, pulse oximetry and re-evaluation of patient's condition.  Final Clinical Impressions(s) / ED Diagnoses   Final diagnoses:  Weakness    ED Discharge Orders    None       Nat Christen, MD 07/22/17 1636    Nat Christen, MD 07/22/17 1653    Nat Christen, MD 07/22/17 [id number]

## 2017-07-22 NOTE — ED Notes (Signed)
Report given to [facility] transport team,

## 2017-07-22 NOTE — ED Notes (Signed)
Dr Winfred Leeds at bedside,

## 2017-07-23 DIAGNOSIS — I951 Orthostatic hypotension: Secondary | ICD-10-CM | POA: Diagnosis not present

## 2017-07-23 DIAGNOSIS — J449 Chronic obstructive pulmonary disease, unspecified: Secondary | ICD-10-CM | POA: Diagnosis not present

## 2017-07-23 DIAGNOSIS — E872 Acidosis: Secondary | ICD-10-CM | POA: Diagnosis not present

## 2017-07-23 DIAGNOSIS — R42 Dizziness and giddiness: Secondary | ICD-10-CM | POA: Diagnosis not present

## 2017-07-23 DIAGNOSIS — D89811 Chronic graft-versus-host disease: Secondary | ICD-10-CM | POA: Diagnosis not present

## 2017-07-23 DIAGNOSIS — J9610 Chronic respiratory failure, unspecified whether with hypoxia or hypercapnia: Secondary | ICD-10-CM | POA: Diagnosis not present

## 2017-07-23 DIAGNOSIS — I10 Essential (primary) hypertension: Secondary | ICD-10-CM | POA: Diagnosis not present

## 2017-07-23 DIAGNOSIS — R9431 Abnormal electrocardiogram [ECG] [EKG]: Secondary | ICD-10-CM | POA: Diagnosis not present

## 2017-07-23 DIAGNOSIS — R7989 Other specified abnormal findings of blood chemistry: Secondary | ICD-10-CM | POA: Diagnosis not present

## 2017-07-23 DIAGNOSIS — R6510 Systemic inflammatory response syndrome (SIRS) of non-infectious origin without acute organ dysfunction: Secondary | ICD-10-CM | POA: Diagnosis not present

## 2017-07-23 DIAGNOSIS — J8489 Other specified interstitial pulmonary diseases: Secondary | ICD-10-CM | POA: Diagnosis not present

## 2017-07-23 MED ORDER — DOCUSATE SODIUM 100 MG PO CAPS
100.00 | ORAL_CAPSULE | ORAL | Status: DC
Start: ? — End: 2017-07-23

## 2017-07-23 MED ORDER — APIXABAN 5 MG PO TABS
5.00 | ORAL_TABLET | ORAL | Status: DC
Start: 2017-07-23 — End: 2017-07-23

## 2017-07-23 MED ORDER — POTASSIUM CHLORIDE ER 10 MEQ PO CPCR
ORAL_CAPSULE | ORAL | Status: DC
Start: 2017-07-23 — End: 2017-07-23

## 2017-07-23 MED ORDER — TIOTROPIUM BROMIDE MONOHYDRATE 18 MCG IN CAPS
1.00 | ORAL_CAPSULE | RESPIRATORY_TRACT | Status: DC
Start: 2017-07-24 — End: 2017-07-23

## 2017-07-23 MED ORDER — ALBUTEROL SULFATE HFA 108 (90 BASE) MCG/ACT IN AERS
2.00 | INHALATION_SPRAY | RESPIRATORY_TRACT | Status: DC
Start: ? — End: 2017-07-23

## 2017-07-23 MED ORDER — GENERIC EXTERNAL MEDICATION
2.00 | Status: DC
Start: 2017-07-23 — End: 2017-07-23

## 2017-07-23 MED ORDER — MONTELUKAST SODIUM 10 MG PO TABS
10.00 | ORAL_TABLET | ORAL | Status: DC
Start: ? — End: 2017-07-23

## 2017-07-23 MED ORDER — CITALOPRAM HYDROBROMIDE 20 MG PO TABS
40.00 | ORAL_TABLET | ORAL | Status: DC
Start: 2017-07-24 — End: 2017-07-23

## 2017-07-23 MED ORDER — PANTOPRAZOLE SODIUM 40 MG PO TBEC
40.00 | DELAYED_RELEASE_TABLET | ORAL | Status: DC
Start: 2017-07-23 — End: 2017-07-23

## 2017-07-23 MED ORDER — CLONAZEPAM 0.5 MG PO TABS
0.25 | ORAL_TABLET | ORAL | Status: DC
Start: 2017-07-23 — End: 2017-07-23

## 2017-07-23 MED ORDER — PREDNISONE 10 MG PO TABS
30.00 | ORAL_TABLET | ORAL | Status: DC
Start: 2017-07-24 — End: 2017-07-23

## 2017-07-23 MED ORDER — GENERIC EXTERNAL MEDICATION
1.00 | Status: DC
Start: ? — End: 2017-07-23

## 2017-07-23 MED ORDER — FLUCONAZOLE 200 MG PO TABS
200.00 | ORAL_TABLET | ORAL | Status: DC
Start: 2017-07-24 — End: 2017-07-23

## 2017-07-23 MED ORDER — DEXTROSE 10 % IV SOLN
125.00 | INTRAVENOUS | Status: DC
Start: ? — End: 2017-07-23

## 2017-07-23 MED ORDER — INSULIN LISPRO 100 UNIT/ML ~~LOC~~ SOLN
2.00 | SUBCUTANEOUS | Status: DC
Start: 2017-07-23 — End: 2017-07-23

## 2017-07-23 MED ORDER — POLYETHYLENE GLYCOL 3350 17 G PO PACK
17.00 g | PACK | ORAL | Status: DC
Start: ? — End: 2017-07-23

## 2017-07-23 MED ORDER — ACYCLOVIR 400 MG PO TABS
400.00 | ORAL_TABLET | ORAL | Status: DC
Start: 2017-07-23 — End: 2017-07-23

## 2017-07-23 MED ORDER — GENERIC EXTERNAL MEDICATION
3.00 | Status: DC
Start: 2017-07-23 — End: 2017-07-23

## 2017-07-23 MED ORDER — SULFAMETHOXAZOLE-TRIMETHOPRIM 800-160 MG PO TABS
1.00 | ORAL_TABLET | ORAL | Status: DC
Start: ? — End: 2017-07-23

## 2017-07-23 MED ORDER — HYDROMORPHONE HCL 4 MG PO TABS
4.00 | ORAL_TABLET | ORAL | Status: DC
Start: 2017-07-23 — End: 2017-07-23

## 2017-07-23 MED ORDER — NEFAZODONE HCL 50 MG PO TABS
50.00 | ORAL_TABLET | ORAL | Status: DC
Start: 2017-07-23 — End: 2017-07-23

## 2017-07-23 MED ORDER — VENLAFAXINE HCL 75 MG PO TABS
150.00 | ORAL_TABLET | ORAL | Status: DC
Start: 2017-07-23 — End: 2017-07-23

## 2017-07-23 MED ORDER — METOPROLOL TARTRATE 25 MG PO TABS
12.50 | ORAL_TABLET | ORAL | Status: DC
Start: 2017-07-23 — End: 2017-07-23

## 2017-07-25 ENCOUNTER — Other Ambulatory Visit: Payer: Self-pay | Admitting: Family Medicine

## 2017-07-26 DIAGNOSIS — M25562 Pain in left knee: Secondary | ICD-10-CM | POA: Diagnosis not present

## 2017-07-26 DIAGNOSIS — K5903 Drug induced constipation: Secondary | ICD-10-CM | POA: Diagnosis not present

## 2017-07-26 DIAGNOSIS — M79605 Pain in left leg: Secondary | ICD-10-CM | POA: Diagnosis not present

## 2017-07-26 DIAGNOSIS — G894 Chronic pain syndrome: Secondary | ICD-10-CM | POA: Diagnosis not present

## 2017-07-27 LAB — CULTURE, BLOOD (ROUTINE X 2)
Culture: NO GROWTH
Culture: NO GROWTH
SPECIAL REQUESTS: ADEQUATE
SPECIAL REQUESTS: ADEQUATE

## 2017-08-03 DIAGNOSIS — R0902 Hypoxemia: Secondary | ICD-10-CM | POA: Diagnosis not present

## 2017-08-04 ENCOUNTER — Encounter: Payer: Self-pay | Admitting: Family Medicine

## 2017-08-04 ENCOUNTER — Ambulatory Visit (INDEPENDENT_AMBULATORY_CARE_PROVIDER_SITE_OTHER): Payer: Medicare HMO | Admitting: Family Medicine

## 2017-08-04 VITALS — BP 132/86 | HR 115 | Temp 97.0°F | Ht 66.0 in | Wt 192.4 lb

## 2017-08-04 DIAGNOSIS — R634 Abnormal weight loss: Secondary | ICD-10-CM | POA: Diagnosis not present

## 2017-08-04 DIAGNOSIS — E1165 Type 2 diabetes mellitus with hyperglycemia: Secondary | ICD-10-CM | POA: Diagnosis not present

## 2017-08-04 DIAGNOSIS — R69 Illness, unspecified: Secondary | ICD-10-CM | POA: Diagnosis not present

## 2017-08-04 LAB — URINALYSIS
BILIRUBIN UA: NEGATIVE
Ketones, UA: NEGATIVE
Leukocytes, UA: NEGATIVE
NITRITE UA: NEGATIVE
PH UA: 6 (ref 5.0–7.5)
RBC, UA: NEGATIVE
Specific Gravity, UA: 1.015 (ref 1.005–1.030)
UUROB: 0.2 mg/dL (ref 0.2–1.0)

## 2017-08-04 MED ORDER — METFORMIN HCL 500 MG PO TABS: 500.0000 mg | ORAL_TABLET | Freq: Every day | ORAL | 0 refills | Status: AC

## 2017-08-04 MED ORDER — GLUCOSE BLOOD VI STRP: ORAL_STRIP | 3 refills | Status: AC

## 2017-08-04 MED ORDER — TORSEMIDE 20 MG PO TABS: ORAL_TABLET | ORAL | 0 refills | Status: AC

## 2017-08-04 NOTE — Patient Instructions (Signed)
Torsemide  Take 1 by mouth daily for weight greater than 202 Take 1/2 tablet for weight between 200-201 Do not take if weight is less than 200

## 2017-08-04 NOTE — Progress Notes (Signed)
Subjective:  Patient ID: Derek Shepherd, male    DOB: 1955/12/12  Age: 62 y.o. MRN: [id number]  CC: Medical Management of Chronic Issues   HPI Derek Shepherd presents for  follow-up of diabetes. Patient does not check blood sugar at home Patient denies symptoms such as polyuria, polydipsia, excessive hunger, nausea No significant hypoglycemic spells noted. Medications as noted below. Taking them regularly without complication/adverse reaction being reported today.  Getting dyspneic frequently. Requiring O2 Depression screen [facility] 2/9 08/04/2017 07/22/2017 06/16/2017  Decreased Interest 1 0 0  Down, Depressed, Hopeless 1 0 0  PHQ - 2 Score 2 0 0  Altered sleeping 2 - -  Tired, decreased energy 2 - -  Change in appetite 0 - -  Feeling bad or failure about yourself  0 - -  Trouble concentrating 1 - -  Moving slowly or fidgety/restless 2 - -  Suicidal thoughts 1 - -  PHQ-9 Score 10 - -  Difficult doing work/chores - - -  Some recent data might be hidden    History Derek Shepherd has a past medical history of Adenomatous colon polyp, Anemia, Anxiety, Arthritis, Asthma, Constipation, COPD (chronic obstructive pulmonary disease) ([place]), Depression, Diverticulosis, Gallstones, GERD (gastroesophageal reflux disease), 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   He has a past surgical history that includes Cholecystectomy; Lung surgery (Left); Exploratory laparotomy; Hernia repair; Colonoscopy w/ biopsies; and Bone marrow transplant (2011).   His family history includes Clotting disorder in his mother; Diabetes in his brother, father, maternal aunt, and sister; Heart attack in his father and mother; Prostate cancer in his brother.He reports that he quit smoking about 24 years ago. His smoking use  included cigarettes. He has never used smokeless tobacco. He reports that he drinks alcohol. He reports that he does not use drugs.    ROS Review of Systems  Constitutional: Negative.   HENT: Negative.   Eyes: Negative for visual disturbance.  Respiratory: Positive for shortness of breath. Negative for cough.   Cardiovascular: Negative for chest pain and leg swelling.  Gastrointestinal: Negative for abdominal pain, diarrhea, nausea and vomiting.  Genitourinary: Negative for difficulty urinating.  Musculoskeletal: Negative for arthralgias and myalgias.  Skin: Negative for rash.  Neurological: Negative for headaches.  Psychiatric/Behavioral: Negative for sleep disturbance.    Objective:  BP 132/86   Pulse (!) 115   Temp (!) 97 F (36.1 C) (Oral)   Ht _0  (1.676 m)   Wt 192 lb 6 oz (87.3 kg)   BMI 31.05 kg/m    BP Readings from Last 3 Encounters:  08/09/17 (!) 163/96  08/04/17 132/86  07/22/17 (!) 146/93    Wt Readings from Last 3 Encounters:  08/08/17 192 lb (87.1 kg)  08/04/17 192 lb 6 oz (87.3 kg)  07/22/17 208 lb (94.3 kg)     Physical Exam  Constitutional: He appears well-developed and well-nourished.  HENT:  Head: Normocephalic and atraumatic.  Right Ear: Tympanic membrane and external ear normal. No decreased hearing is noted.  Left Ear: Tympanic membrane and external ear normal. No decreased hearing is noted.  Mouth/Throat: No oropharyngeal exudate or posterior oropharyngeal erythema.  Eyes: Pupils are equal, round, and reactive to light.  Neck: Normal range of motion. Neck supple.  Cardiovascular: Normal rate and regular rhythm.  No murmur heard. Pulmonary/Chest: Breath sounds normal. No respiratory distress.  Abdominal: Soft. Bowel sounds are normal. He exhibits no mass. There is no tenderness.  Vitals reviewed.     Assessment & Plan:   Pearley was seen today for medical management of chronic issues.  Diagnoses and all orders for this  visit:  Weight loss -     Urinalysis  Type 2 diabetes mellitus with hyperglycemia, without long-term current use of insulin (HCC) -     CBC with Differential/Platelet -     CMP14+EGFR -     metFORMIN (GLUCOPHAGE) 500 MG tablet; Take 1 tablet (500 mg total) by mouth daily with breakfast.  Other orders -     glucose blood (IGLUCOSE TEST STRIPS) test strip; Use to check blood sugars twice daily DX E11.9 -     torsemide (DEMADEX) 20 MG tablet; Take as directed according to weight (Patient taking differently: Take 20 mg by mouth See admin instructions. Take as directed according to weight)       I have discontinued Cecilio Asper. Derek Shepherd's torsemide. I have also changed his metFORMIN. Additionally, I am having him start on glucose blood and torsemide. Lastly, I am having him maintain his lactulose, multivitamin with minerals, Polyethyl Glycol-Propyl Glycol, SYMBICORT, levalbuterol, tiotropium, clonazePAM, sulfamethoxazole-trimethoprim, TRUBIOTICS, PROAIR HFA, triamcinolone cream, HYDROmorphone, pantoprazole, potassium chloride, venlafaxine, INCRUSE ELLIPTA, montelukast, ranitidine, citalopram, fluticasone, folic acid, acyclovir, apixaban, predniSONE, and nefazodone.  Allergies as of 08/04/2017      Reactions   Nsaids Shortness Of Breath   Pt states he can take ibuprofen occasionally with no reaction   Pregabalin Other (See Comments)   Fluid retention      Medication List        Accurate as of 08/04/17 11:59 PM. Always use your most recent med list.          acyclovir 400 MG tablet Commonly known as:  ZOVIRAX Take 400 mg by mouth 2 (two) times daily.   citalopram 40 MG tablet Commonly known as:  CELEXA Take 1 tablet (40 mg total) by mouth daily.   clonazePAM 0.5 MG tablet Commonly known as:  KLONOPIN Take 1 mg by mouth at bedtime.   ELIQUIS 5 MG Tabs tablet Generic drug:  apixaban Take 5 mg by mouth 2 (two) times daily.   fluconazole 200 MG tablet Commonly known as:   DIFLUCAN Take 200 mg by mouth daily.   fluticasone 50 MCG/ACT nasal spray Commonly known as:  FLONASE USE ONE SPRAY(S) IN EACH NOSTRIL ONCE DAILY   folic acid 1 MG tablet Commonly known as:  FOLVITE TAKE 1 TABLET BY MOUTH ONCE DAILY   glucose blood test strip Commonly known as:  IGLUCOSE TEST STRIPS Use to check blood sugars twice daily DX E11.9   HYDROmorphone 4 MG tablet Commonly known as:  DILAUDID Take 4 mg by mouth 5 (five) times daily.   INCRUSE ELLIPTA 62.5 MCG/INH Aepb Generic drug:  umeclidinium bromide INHALE 1 PUFF BY MOUTH ONCE DAILY   lactulose 10 GM/15ML solution Commonly known as:  CHRONULAC Take by mouth daily as needed for mild constipation or moderate constipation.   levalbuterol 1.25 MG/3ML nebulizer solution Commonly known as:  XOPENEX USE ONE VIAL IN NEBULIZER 4 TIMES DAILY   metFORMIN 500 MG tablet Commonly known as:  GLUCOPHAGE Take 1 tablet (500 mg total) by mouth daily with breakfast.   montelukast 10 MG tablet Commonly known as:  SINGULAIR TAKE 1 TABLET BY MOUTH ONCE DAILY   multivitamin with minerals Tabs tablet Take 1 tablet by mouth daily.   nefazodone  50 MG tablet Commonly known as:  SERZONE TAKE 1 TABLET BY MOUTH AT BEDTIME (NO LONGER TAKING ELIQUIS)   pantoprazole 40 MG tablet Commonly known as:  PROTONIX Take 1 tablet (40 mg total) by mouth at bedtime.   potassium chloride 10 MEQ CR capsule Commonly known as:  MICRO-K TAKE THREE CAPSULES BY MOUTH TWICE DAILY   predniSONE 10 MG tablet Commonly known as:  DELTASONE Take 10 mg by mouth 2 (two) times daily.   PROAIR HFA 108 (90 Base) MCG/ACT inhaler Generic drug:  albuterol Inhale 2 puffs into the lungs daily.   ranitidine 150 MG tablet Commonly known as:  ZANTAC Take 150 mg by mouth 2 (two) times daily.   rOPINIRole 3 MG tablet Commonly known as:  REQUIP TAKE 1 TABLET BY MOUTH ONCE DAILY AT BEDTIME   sulfamethoxazole-trimethoprim 800-160 MG tablet Commonly known as:   BACTRIM DS,SEPTRA DS Take 1 tablet by mouth every Monday, Wednesday, and Friday.   SYMBICORT 160-4.5 MCG/ACT inhaler Generic drug:  budesonide-formoterol INHALE TWO PUFFS BY MOUTH ONCE DAILY IN THE MORNING   SYSTANE 0.4-0.3 % Soln Generic drug:  Polyethyl Glycol-Propyl Glycol Place 1 drop into both eyes 5 (five) times daily as needed (for dry eyes).   tiotropium 18 MCG inhalation capsule Commonly known as:  SPIRIVA HANDIHALER Place 1 capsule (18 mcg total) daily into inhaler and inhale.   torsemide 20 MG tablet Commonly known as:  DEMADEX Take as directed according to weight   triamcinolone cream 0.1 % Commonly known as:  KENALOG Apply 1 application topically 2 (two) times daily as needed (rash/itching).   TRUBIOTICS Caps Take 1 capsule by mouth daily.   venlafaxine 75 MG tablet Commonly known as:  EFFEXOR TAKE 1 TABLET BY MOUTH IN THE EVENING FOR 7 DAYS THEN INCREASE TO 2 BY MOUTH EVERY EVENING      Torsemide  Take 1 by mouth daily for weight greater than 202 Take 1/2 tablet for weight between 200-201 Do not take if weight is less than 200  Follow-up: Return in about 3 months (around 11/04/2017).  Claretta Fraise, M.D.

## 2017-08-05 DIAGNOSIS — G2581 Restless legs syndrome: Secondary | ICD-10-CM | POA: Diagnosis not present

## 2017-08-05 DIAGNOSIS — R69 Illness, unspecified: Secondary | ICD-10-CM | POA: Diagnosis not present

## 2017-08-05 DIAGNOSIS — Z9481 Bone marrow transplant status: Secondary | ICD-10-CM | POA: Diagnosis not present

## 2017-08-05 DIAGNOSIS — C9591 Leukemia, unspecified, in remission: Secondary | ICD-10-CM | POA: Diagnosis not present

## 2017-08-05 DIAGNOSIS — E669 Obesity, unspecified: Secondary | ICD-10-CM | POA: Diagnosis not present

## 2017-08-05 DIAGNOSIS — I509 Heart failure, unspecified: Secondary | ICD-10-CM | POA: Diagnosis not present

## 2017-08-05 DIAGNOSIS — G8929 Other chronic pain: Secondary | ICD-10-CM | POA: Diagnosis not present

## 2017-08-05 DIAGNOSIS — J9610 Chronic respiratory failure, unspecified whether with hypoxia or hypercapnia: Secondary | ICD-10-CM | POA: Diagnosis not present

## 2017-08-05 DIAGNOSIS — Z9484 Stem cells transplant status: Secondary | ICD-10-CM | POA: Diagnosis not present

## 2017-08-05 DIAGNOSIS — J449 Chronic obstructive pulmonary disease, unspecified: Secondary | ICD-10-CM | POA: Diagnosis not present

## 2017-08-05 LAB — CMP14+EGFR
A/G RATIO: 2.4 — AB (ref 1.2–2.2)
ALBUMIN: 4.5 g/dL (ref 3.5–5.5)
ALK PHOS: 50 IU/L (ref 39–117)
ALT: 33 IU/L (ref 0–44)
AST: 26 IU/L (ref 0–40)
BILIRUBIN TOTAL: 0.3 mg/dL (ref 0.0–1.2)
BUN / CREAT RATIO: 17 (ref 9–20)
BUN: 25 mg/dL — ABNORMAL HIGH (ref 6–24)
CHLORIDE: 89 mmol/L — AB (ref 96–106)
CO2: 24 mmol/L (ref 20–29)
Calcium: 10.6 mg/dL — ABNORMAL HIGH (ref 8.7–10.2)
Creatinine, Ser: 1.47 mg/dL — ABNORMAL HIGH (ref 0.76–1.27)
GFR calc non Af Amer: 51 mL/min/{1.73_m2} — ABNORMAL LOW (ref >59)
GFR, EST AFRICAN AMERICAN: 60 mL/min/{1.73_m2} (ref >59)
GLOBULIN, TOTAL: 1.9 g/dL (ref 1.5–4.5)
Glucose: 266 mg/dL — ABNORMAL HIGH (ref 65–99)
Potassium: 4.1 mmol/L (ref 3.5–5.2)
SODIUM: 139 mmol/L (ref 134–144)
TOTAL PROTEIN: 6.4 g/dL (ref 6.0–8.5)

## 2017-08-05 LAB — CBC WITH DIFFERENTIAL/PLATELET
Basophils Absolute: 0 10*3/uL (ref 0.0–0.2)
Basos: 0 %
EOS (ABSOLUTE): 0 10*3/uL (ref 0.0–0.4)
EOS: 0 %
HEMATOCRIT: 34.8 % — AB (ref 37.5–51.0)
HEMOGLOBIN: 11.3 g/dL — AB (ref 13.0–17.7)
IMMATURE GRANS (ABS): 0.2 10*3/uL — AB (ref 0.0–0.1)
Immature Granulocytes: 2 %
Lymphocytes Absolute: 0.7 10*3/uL (ref 0.7–3.1)
Lymphs: 7 %
MCH: 34.5 pg — AB (ref 26.6–33.0)
MCHC: 32.5 g/dL (ref 31.5–35.7)
MCV: 106 fL — AB (ref 79–97)
MONOS ABS: 1 10*3/uL — AB (ref 0.1–0.9)
Monocytes: 9 %
NEUTROS PCT: 82 %
Neutrophils Absolute: 8.7 10*3/uL — ABNORMAL HIGH (ref 1.4–7.0)
Platelets: 246 10*3/uL (ref 150–450)
RBC: 3.28 x10E6/uL — ABNORMAL LOW (ref 4.14–5.80)
RDW: 19 % — ABNORMAL HIGH (ref 12.3–15.4)
WBC: 10.5 10*3/uL (ref 3.4–10.8)

## 2017-08-07 ENCOUNTER — Telehealth: Payer: Self-pay | Admitting: *Deleted

## 2017-08-07 NOTE — Telephone Encounter (Signed)
Aware, per message left on voice mail, continue to monitor.

## 2017-08-07 NOTE — Telephone Encounter (Signed)
[place] practitioner saw pt over weekend.  Pulse 112 BP 156/88 Is patient being treated for tachycardia Message can be left for home health

## 2017-08-07 NOTE — Telephone Encounter (Signed)
Continue to monitor

## 2017-08-08 ENCOUNTER — Emergency Department (HOSPITAL_COMMUNITY)
Admission: EM | Admit: 2017-08-08 | Discharge: 2017-08-09 | Disposition: A | Discharge status: Other Acute Inpatient Hospital | Payer: Medicare HMO | Attending: Emergency Medicine | Admitting: Emergency Medicine

## 2017-08-08 ENCOUNTER — Encounter (HOSPITAL_COMMUNITY): Payer: Self-pay | Admitting: Emergency Medicine

## 2017-08-08 ENCOUNTER — Emergency Department (HOSPITAL_COMMUNITY): Payer: Medicare HMO

## 2017-08-08 ENCOUNTER — Other Ambulatory Visit: Payer: Self-pay

## 2017-08-08 ENCOUNTER — Other Ambulatory Visit: Payer: Self-pay | Admitting: Family Medicine

## 2017-08-08 DIAGNOSIS — I5022 Chronic systolic (congestive) heart failure: Secondary | ICD-10-CM | POA: Diagnosis not present

## 2017-08-08 DIAGNOSIS — F329 Major depressive disorder, single episode, unspecified: Secondary | ICD-10-CM | POA: Diagnosis not present

## 2017-08-08 DIAGNOSIS — R55 Syncope and collapse: Secondary | ICD-10-CM | POA: Diagnosis not present

## 2017-08-08 DIAGNOSIS — R0602 Shortness of breath: Secondary | ICD-10-CM | POA: Diagnosis not present

## 2017-08-08 DIAGNOSIS — W19XXXA Unspecified fall, initial encounter: Secondary | ICD-10-CM | POA: Diagnosis not present

## 2017-08-08 DIAGNOSIS — Z9049 Acquired absence of other specified parts of digestive tract: Secondary | ICD-10-CM | POA: Insufficient documentation

## 2017-08-08 DIAGNOSIS — R740 Nonspecific elevation of levels of transaminase and lactic acid dehydrogenase [LDH]: Secondary | ICD-10-CM | POA: Diagnosis not present

## 2017-08-08 DIAGNOSIS — F419 Anxiety disorder, unspecified: Secondary | ICD-10-CM | POA: Insufficient documentation

## 2017-08-08 DIAGNOSIS — E119 Type 2 diabetes mellitus without complications: Secondary | ICD-10-CM | POA: Diagnosis not present

## 2017-08-08 DIAGNOSIS — I951 Orthostatic hypotension: Secondary | ICD-10-CM

## 2017-08-08 DIAGNOSIS — R7989 Other specified abnormal findings of blood chemistry: Secondary | ICD-10-CM

## 2017-08-08 DIAGNOSIS — E1165 Type 2 diabetes mellitus with hyperglycemia: Secondary | ICD-10-CM | POA: Diagnosis not present

## 2017-08-08 DIAGNOSIS — R52 Pain, unspecified: Secondary | ICD-10-CM | POA: Diagnosis not present

## 2017-08-08 DIAGNOSIS — J45909 Unspecified asthma, uncomplicated: Secondary | ICD-10-CM | POA: Insufficient documentation

## 2017-08-08 DIAGNOSIS — R509 Fever, unspecified: Secondary | ICD-10-CM

## 2017-08-08 DIAGNOSIS — R42 Dizziness and giddiness: Secondary | ICD-10-CM | POA: Diagnosis not present

## 2017-08-08 DIAGNOSIS — Z87891 Personal history of nicotine dependence: Secondary | ICD-10-CM | POA: Diagnosis not present

## 2017-08-08 DIAGNOSIS — R69 Illness, unspecified: Secondary | ICD-10-CM | POA: Diagnosis not present

## 2017-08-08 LAB — URINALYSIS, ROUTINE W REFLEX MICROSCOPIC
BILIRUBIN URINE: NEGATIVE
Bacteria, UA: NONE SEEN
GLUCOSE, UA: 50 mg/dL — AB
HGB URINE DIPSTICK: NEGATIVE
Ketones, ur: NEGATIVE mg/dL
Leukocytes, UA: NEGATIVE
NITRITE: NEGATIVE
PH: 6 (ref 5.0–8.0)
Protein, ur: 100 mg/dL — AB
SPECIFIC GRAVITY, URINE: 1.011 (ref 1.005–1.030)

## 2017-08-08 LAB — CBC WITH DIFFERENTIAL/PLATELET
Basophils Absolute: 0 10*3/uL (ref 0.0–0.1)
Basophils Relative: 0 %
EOS PCT: 0 %
Eosinophils Absolute: 0 10*3/uL (ref 0.0–0.7)
HCT: 35.6 % — ABNORMAL LOW (ref 39.0–52.0)
Hemoglobin: 11.4 g/dL — ABNORMAL LOW (ref 13.0–17.0)
LYMPHS ABS: 1.1 10*3/uL (ref 0.7–4.0)
LYMPHS PCT: 10 %
MCH: 34.7 pg — AB (ref 26.0–34.0)
MCHC: 32 g/dL (ref 30.0–36.0)
MCV: 108.2 fL — AB (ref 78.0–100.0)
MONOS PCT: 10 %
Monocytes Absolute: 1.1 10*3/uL — ABNORMAL HIGH (ref 0.1–1.0)
Neutro Abs: 8.5 10*3/uL — ABNORMAL HIGH (ref 1.7–7.7)
Neutrophils Relative %: 80 %
PLATELETS: 275 10*3/uL (ref 150–400)
RBC: 3.29 MIL/uL — ABNORMAL LOW (ref 4.22–5.81)
RDW: 18.2 % — ABNORMAL HIGH (ref 11.5–15.5)
WBC: 10.8 10*3/uL — AB (ref 4.0–10.5)

## 2017-08-08 LAB — I-STAT CG4 LACTIC ACID, ED
Lactic Acid, Venous: 6.82 mmol/L (ref 0.5–1.9)
Lactic Acid, Venous: 2.86 mmol/L (ref 0.5–1.9)

## 2017-08-08 LAB — COMPREHENSIVE METABOLIC PANEL
ALK PHOS: 46 U/L (ref 38–126)
ALT: 39 U/L (ref 17–63)
AST: 32 U/L (ref 15–41)
Albumin: 4.4 g/dL (ref 3.5–5.0)
Anion gap: 16 — ABNORMAL HIGH (ref 5–15)
BUN: 35 mg/dL — ABNORMAL HIGH (ref 6–20)
CALCIUM: 10.2 mg/dL (ref 8.9–10.3)
CHLORIDE: 87 mmol/L — AB (ref 101–111)
CO2: 33 mmol/L — ABNORMAL HIGH (ref 22–32)
CREATININE: 1.35 mg/dL — AB (ref 0.61–1.24)
GFR, EST NON AFRICAN AMERICAN: 56 mL/min — AB (ref >60)
Glucose, Bld: 239 mg/dL — ABNORMAL HIGH (ref 65–99)
Potassium: 4 mmol/L (ref 3.5–5.1)
Sodium: 136 mmol/L (ref 135–145)
Total Bilirubin: 0.6 mg/dL (ref 0.3–1.2)
Total Protein: 6.8 g/dL (ref 6.5–8.1)

## 2017-08-08 LAB — CBG MONITORING, ED: Glucose-Capillary: 235 mg/dL — ABNORMAL HIGH (ref 65–99)

## 2017-08-08 MED ORDER — SODIUM CHLORIDE 0.9 % IV BOLUS
1000.0000 mL | Freq: Once | INTRAVENOUS | Status: AC
  Administered 2017-08-08: 1000 mL via INTRAVENOUS

## 2017-08-08 MED ORDER — PIPERACILLIN-TAZOBACTAM 3.375 G IVPB 30 MIN
3.3750 g | Freq: Once | INTRAVENOUS | Status: AC
  Administered 2017-08-08: 3.375 g via INTRAVENOUS
  Filled 2017-08-08: qty 50

## 2017-08-08 MED ORDER — VANCOMYCIN HCL IN DEXTROSE 1-5 GM/200ML-% IV SOLN
1000.0000 mg | Freq: Once | INTRAVENOUS | Status: AC
  Administered 2017-08-08: 1000 mg via INTRAVENOUS
  Filled 2017-08-08: qty 200

## 2017-08-08 MED ORDER — PHENYLEPHRINE 40 MCG/ML (10ML) SYRINGE FOR IV PUSH (FOR BLOOD PRESSURE SUPPORT): 80.0000 ug | PREFILLED_SYRINGE | Freq: Once | INTRAVENOUS | Status: DC

## 2017-08-08 MED ORDER — ACETAMINOPHEN 500 MG PO TABS
1000.0000 mg | ORAL_TABLET | Freq: Once | ORAL | Status: AC
  Administered 2017-08-08: 1000 mg via ORAL
  Filled 2017-08-08: qty 2

## 2017-08-08 MED ORDER — LACTATED RINGERS IV BOLUS
1000.0000 mL | Freq: Once | INTRAVENOUS | Status: AC
  Administered 2017-08-08: 1000 mL via INTRAVENOUS

## 2017-08-08 NOTE — Progress Notes (Signed)
Pharmacy Note:  Initial antibiotic(s) regimen of Vancomycin and Zosyn ordered by EDP to treat Sepsis.  Estimated Creatinine Clearance: 55.9 mL/min (A) (by C-G formula based on SCr of 1.47 mg/dL (H)).   Allergies  Allergen Reactions  . Nsaids Shortness Of Breath    Pt states he can take ibuprofen occasionally with no reaction  . Pregabalin Other (See Comments)    Fluid retention   Vitals:   08/08/17 1911 08/08/17 1930  BP:  (!) 136/94  Pulse:  (!) 114  Resp:  20  Temp: (!) 101.4 F (38.6 C)   SpO2:  98%   Anti-infectives (From admission, onward)   Start     Dose/Rate Route Frequency Ordered Stop   08/08/17 1930  piperacillin-tazobactam (ZOSYN) IVPB 3.375 g     3.375 g 100 mL/hr over 30 Minutes Intravenous  Once 08/08/17 1921     08/08/17 1930  vancomycin (VANCOCIN) IVPB 1000 mg/200 mL premix     1,000 mg 200 mL/hr over 60 Minutes Intravenous  Once 08/08/17 1921       Antimicrobials this admission:  Vanc 6/4 >>   Zosyn 6/4 >>   Dose adjustments this admission:  n/a   Microbiology results:  6/4 BCx: pending 6/4 UCx: pending   Sputum:    MRSA PCR:    Plan: Initial dose(s) of Vancomycin and Zosyn  X 1 ordered. F/U admission orders for further dosing if therapy continued.  Pricilla Larsson, [facility] 08/08/2017 7:38 PM

## 2017-08-08 NOTE — ED Provider Notes (Signed)
Emergency Department Provider Note   I have reviewed the triage vital signs and the nursing notes.   HISTORY  Chief Complaint Loss of Consciousness   HPI Derek Shepherd is a 62 y.o. male with multiple medical problems who presents emergency department today secondary to loss of consciousness.  Story from EMS and patient, this sounds like he has had some lightheadedness for the last couple days and then today he stood up and passed out.  Was found to be hypotensive and tachycardic. Fluids started and brought here. Has had increased urination recently. Some intermittent non productive cough. Mild SOB.  No other associated or modifying symptoms.    Past Medical History:  Diagnosis Date  . Adenomatous colon polyp    Tubular adenoma  . Anemia   . Anxiety   . Arthritis   . Asthma   . Constipation   . COPD (chronic obstructive pulmonary disease) ([place])   . Depression   . Diverticulosis   . Gallstones   . GERD (gastroesophageal reflux disease)   . 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 History of bone marrow transplant ([place])   . History of pulmonary embolus (PE)    Recurrent  . Leukemia-lymphoma, T-cell, acute, HTLV-I-associated ([place])   . Type 2 diabetes mellitus [facility])     Patient Active Problem List   Diagnosis Date Noted  . Chest pain 06/06/2017  . History of syncope 06/06/2017  . Depression with anxiety 06/06/2017  . Orthostatic hypotension 01/29/2017  . Thrombocytopenia due to drugs 01/29/2017  . COPD (chronic obstructive pulmonary disease)/BOOP 01/29/2017  . Chronic respiratory failure with hypoxia and hypercapnia ([place]) 01/29/2017  . History of chronic pulmonary embolism 01/29/2017  . Chronic systolic heart failure ([place]) 01/29/2017  . Anemia 01/29/2017  . History of Leukemia-lymphoma, T-cell, acute, HTLV-I-associated (s/p BMT 2011) 01/29/2017  . Diabetes mellitus type 2, uncontrolled ([place]) 01/29/2017  . Acute kidney  injury ([place]) 01/29/2017  . Chronic hypercapnic respiratory failure ([place]) 01/23/2017  . Sepsis ([place]) 01/02/2017  . Fever 01/02/2017  . Bronchiectasis ([place]) 12/13/2016  . Symptomatic anemia 04/07/2016  . Hyponatremia 03/04/2016  . Panlobular emphysema ([place]) 12/22/2015  . Anemia of chronic disease 11/24/2015  . Chronic pulmonary embolism ([place]) 11/24/2015  . Thrombocytopenia ([place]) 11/24/2015  . Depression 11/24/2015  . Chronic diastolic CHF (congestive heart failure) ([place]) 11/24/2015  . Controlled type 2 diabetes mellitus without complication, without long-term current use of insulin ([place]) 11/24/2015  . History of bone marrow transplant ([place])   . ALL (acute lymphoid leukemia) in remission ([place]) 03/20/2013    Past Surgical History:  Procedure Laterality Date  . BONE MARROW TRANSPLANT  2011  . CHOLECYSTECTOMY    . COLONOSCOPY W/ BIOPSIES    . EXPLORATORY LAPAROTOMY     1986. Had tractor flipped over while he was operating it.   Marland Kitchen HERNIA REPAIR     x 2  . LUNG SURGERY Left     Current Outpatient Rx  . Order #: [id number] Class: Historical Med  . Order #: [id number] Class: Historical Med  . Order #: [id number] Class: Historical Med  . Order #: [id number] Class: Normal  . Order #: [id number] Class: Historical Med  . Order #: [id number] Class: Historical Med  . Order #: [id number] Class: Normal  . Order #: [id number] Class: Normal  . Order #: [id number] Class: Normal  . Order #: [id number] Class: Historical Med  . Order #: [id number] Class: Normal  . Order #: [id number] Class: Historical Med  . Order #: [id number] Class: Normal  . Order #:  [id number] Class: Normal  . Order #: [id number] Class: Normal  . Order #: [id number] Class: Historical Med  . Order #: [id number] Class: Normal  . Order #: [id number] Class: Normal  . Order #: [id number] Class: Historical Med  . Order #: [id number] Class: Normal  . Order #: [id number] Class: Historical Med  . Order #: [id number] Class: Historical Med  . Order #: [id number] Class:  Historical Med  . Order #: [id number] Class: Normal  . Order #: [id number] Class: Historical Med  . Order #: [id number] Class: Normal  . Order #: [id number] Class: Normal  . Order #: [id number] Class: Normal  . Order #: [id number] Class: Historical Med  . Order #: [id number] Class: Normal    Allergies Nsaids and Pregabalin  Family History  Problem Relation Age of Onset  . Clotting disorder Mother   . Heart attack Mother   . Diabetes Father   . Heart attack Father   . Prostate cancer Brother   . Diabetes Brother        x 3  . Diabetes Sister        x 3  . Diabetes Maternal Aunt     Social History Social History   Tobacco Use  . Smoking status: Former Smoker    Types: Cigarettes    Last attempt to quit: 07/13/1993    Years since quitting: 24.0  . Smokeless tobacco: Never Used  Substance Use Topics  . Alcohol use: Yes    Alcohol/week: 0.0 oz    Comment: socially  . Drug use: No    Review of Systems  All other systems negative except as documented in the HPI. All pertinent positives and negatives as reviewed in the HPI. ____________________________________________   PHYSICAL EXAM:  VITAL SIGNS: ED Triage Vitals [08/08/17 1850]  Enc Vitals Group     BP (!) 144/95     Pulse Rate (!) 126     Resp 20     Temp 100.1 F (37.8 C)     Temp Source Oral     SpO2 99 %     Weight 192 lb (87.1 kg)     Height 5\' 6"  (1.676 m)     Head Circumference      Peak Flow      Pain Score 0     Pain Loc      Pain Edu?      Excl. in [place]?     Constitutional: Alert and oriented. Well appearing and in no acute distress. Eyes: Conjunctivae are normal. PERRL. EOMI. Head: Atraumatic. Nose: No congestion/rhinnorhea. Mouth/Throat: Mucous membranes are dry, lips dry.  Oropharynx non-erythematous. Neck: No stridor.  No meningeal signs.   Cardiovascular: tachycardic rate, regular rhythm. Good peripheral circulation. Grossly normal heart sounds.   Respiratory: tachypneic respiratory effort.  No  retractions. Lungs CTAB. Gastrointestinal: Soft and nontender. No distention.  Musculoskeletal: No lower extremity tenderness nor edema. No gross deformities of extremities. Neurologic:  Normal speech and language. No gross focal neurologic deficits are appreciated.  Skin:  Skin is warm, dry and intact and mild tenting noted. No rash noted.   ____________________________________________   LABS (all labs ordered are listed, but only abnormal results are displayed)  Labs Reviewed  CBC WITH DIFFERENTIAL/PLATELET - Abnormal; Notable for the following components:      Result Value   WBC 10.8 (*)    RBC 3.29 (*)    Hemoglobin 11.4 (*)    HCT 35.6 (*)    MCV 108.2 (*)    MCH 34.7 (*)    RDW 18.2 (*)    Neutro Abs  8.5 (*)    Monocytes Absolute 1.1 (*)    All other components within normal limits  CBG MONITORING, ED - Abnormal; Notable for the following components:   Glucose-Capillary 235 (*)    All other components within normal limits  I-STAT CG4 LACTIC ACID, ED - Abnormal; Notable for the following components:   Lactic Acid, Venous 6.82 (*)    All other components within normal limits  CULTURE, BLOOD (ROUTINE X 2)  CULTURE, BLOOD (ROUTINE X 2)  URINE CULTURE  URINALYSIS, ROUTINE W REFLEX MICROSCOPIC  COMPREHENSIVE METABOLIC PANEL   ____________________________________________  EKG   EKG Interpretation  Date/Time:  Tuesday August 08 2017 18:49:10 EDT Ventricular Rate:  126 PR Interval:    QRS Duration: 88 QT Interval:  320 QTC Calculation: 464 R Axis:   -17 Text Interpretation:  Sinus tachycardia LAE, consider biatrial enlargement Borderline left axis deviation Abnormal R-wave progression, late transition Minimal ST depression, lateral leads No significant change since last tracing Confirmed by Merrily Pew [phone]) on 08/08/2017 6:55:42 PM       ____________________________________________  RADIOLOGY  No results  found.  ____________________________________________   PROCEDURES  Procedure(s) performed:   Procedures  CRITICAL CARE Performed by: Merrily Pew Total critical care time: 32 minutes Critical care time was exclusive of separately billable procedures and treating other patients. Critical care was necessary to treat or prevent imminent or life-threatening deterioration. Critical care was time spent personally by me on the following activities: development of treatment plan with patient and/or surrogate as well as nursing, discussions with consultants, evaluation of patient's response to treatment, examination of patient, obtaining history from patient or surrogate, ordering and performing treatments and interventions, ordering and review of laboratory studies, ordering and review of radiographic studies, pulse oximetry and re-evaluation of patient's condition.  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  Rectal temperature checked and was febrile so code sepsis initiated.  Recently in hospital for vancomycin and cefepime started.  Will evaluate for source of infection.  Patient and family state that his physicians at [facility] want him admitted there if needed.  Clinical Course as of Aug 09 133  Tue Aug 08, 2017  1952 Nurse informed me approximately 15 minutes ago. Another liter ordered. Possibly from the orthostatic hypotension vs dehydration vs septic shock. Will recheck in 3 hours.   Lactic Acid, Venous(!!): 6.82 [JM]  1953 Slightly elevated but is on chronic steroids. Sepsis already initiated.   WBC(!): 10.8 [JM]  1953 Improving with fluids  Pulse Rate(!): 114 [JM]    Clinical Course User Index [JM] Adalai Perl, Corene Cornea, MD    Workup as above. Discussed  with [facility], who accepts in transfer. Admission to bone marrow transplant unit, Dr. Cassell Clement attending. Pending bed. Stable. They request culture from port but no other requests at this time. Would redose vanc/zosyn as needed if still in ED.   Pertinent labs & imaging results that were available during my care of the patient were reviewed by me and considered in my medical decision making (see chart for details).  ____________________________________________  FINAL CLINICAL IMPRESSION(S) / ED DIAGNOSES  Final diagnoses:  None     MEDICATIONS GIVEN DURING THIS VISIT:  Medications  lactated ringers bolus 1,000 mL (1,000 mLs Intravenous New Bag/Given 08/08/17 1907)  piperacillin-tazobactam (ZOSYN) IVPB 3.375 g (3.375 g Intravenous  New Bag/Given 08/08/17 1934)  vancomycin (VANCOCIN) IVPB 1000 mg/200 mL premix (1,000 mg Intravenous New Bag/Given 08/08/17 1935)  sodium chloride 0.9 % bolus 1,000 mL (has no administration in time range)  acetaminophen (TYLENOL) tablet 1,000 mg (has no administration in time range)     NEW OUTPATIENT MEDICATIONS STARTED DURING THIS VISIT:  New Prescriptions   No medications on file    Note:  This note was prepared with assistance of Dragon voice recognition software. Occasional wrong-word or sound-a-like substitutions may have occurred due to the inherent limitations of voice recognition software.   Zhaire Locker, Corene Cornea, MD 08/09/17 [phone]

## 2017-08-08 NOTE — ED Notes (Signed)
Pt given sandwich and chips

## 2017-08-08 NOTE — ED Triage Notes (Addendum)
Patient brought in by EMS with complaint of syncope episode at home. Per EMS, patient's B/P drops every time patient moves. B/P 90 systolic per EMS. Patient denies pain at this time.

## 2017-08-08 NOTE — ED Notes (Signed)
Date and time results received: 08/08/17 9:24 PM  Test: I-stat Lactic Critical Value: 2.86  Name of Provider Notified: Mesner  Orders Received? Or Actions Taken?: no new orders at this time.

## 2017-08-08 NOTE — ED Notes (Signed)
Date and time results received: 08/08/17 1929  Test: I-stat Lactic Critical Value: 6.82  Name of Provider Notified: Mesner  Orders Received? Or Actions Taken?: provider placing orders at this time.

## 2017-08-09 DIAGNOSIS — G8929 Other chronic pain: Secondary | ICD-10-CM | POA: Diagnosis not present

## 2017-08-09 DIAGNOSIS — J9610 Chronic respiratory failure, unspecified whether with hypoxia or hypercapnia: Secondary | ICD-10-CM | POA: Diagnosis not present

## 2017-08-09 DIAGNOSIS — J8489 Other specified interstitial pulmonary diseases: Secondary | ICD-10-CM | POA: Diagnosis not present

## 2017-08-09 DIAGNOSIS — K219 Gastro-esophageal reflux disease without esophagitis: Secondary | ICD-10-CM | POA: Diagnosis not present

## 2017-08-09 DIAGNOSIS — R509 Fever, unspecified: Secondary | ICD-10-CM | POA: Diagnosis not present

## 2017-08-09 DIAGNOSIS — Z87891 Personal history of nicotine dependence: Secondary | ICD-10-CM | POA: Diagnosis not present

## 2017-08-09 DIAGNOSIS — R9431 Abnormal electrocardiogram [ECG] [EKG]: Secondary | ICD-10-CM | POA: Diagnosis not present

## 2017-08-09 DIAGNOSIS — Z86718 Personal history of other venous thrombosis and embolism: Secondary | ICD-10-CM | POA: Diagnosis not present

## 2017-08-09 DIAGNOSIS — R740 Nonspecific elevation of levels of transaminase and lactic acid dehydrogenase [LDH]: Secondary | ICD-10-CM | POA: Diagnosis not present

## 2017-08-09 DIAGNOSIS — Z9484 Stem cells transplant status: Secondary | ICD-10-CM | POA: Diagnosis not present

## 2017-08-09 DIAGNOSIS — Z856 Personal history of leukemia: Secondary | ICD-10-CM | POA: Diagnosis not present

## 2017-08-09 DIAGNOSIS — T865 Complications of stem cell transplant: Secondary | ICD-10-CM | POA: Diagnosis not present

## 2017-08-09 DIAGNOSIS — R918 Other nonspecific abnormal finding of lung field: Secondary | ICD-10-CM | POA: Diagnosis not present

## 2017-08-09 DIAGNOSIS — Z86711 Personal history of pulmonary embolism: Secondary | ICD-10-CM | POA: Diagnosis not present

## 2017-08-09 DIAGNOSIS — R0989 Other specified symptoms and signs involving the circulatory and respiratory systems: Secondary | ICD-10-CM | POA: Diagnosis not present

## 2017-08-09 DIAGNOSIS — R55 Syncope and collapse: Secondary | ICD-10-CM | POA: Diagnosis not present

## 2017-08-09 DIAGNOSIS — G4700 Insomnia, unspecified: Secondary | ICD-10-CM | POA: Diagnosis not present

## 2017-08-09 DIAGNOSIS — R5081 Fever presenting with conditions classified elsewhere: Secondary | ICD-10-CM | POA: Diagnosis not present

## 2017-08-09 DIAGNOSIS — J9611 Chronic respiratory failure with hypoxia: Secondary | ICD-10-CM | POA: Diagnosis not present

## 2017-08-09 DIAGNOSIS — R69 Illness, unspecified: Secondary | ICD-10-CM | POA: Diagnosis not present

## 2017-08-09 DIAGNOSIS — G2581 Restless legs syndrome: Secondary | ICD-10-CM | POA: Diagnosis not present

## 2017-08-09 DIAGNOSIS — C9101 Acute lymphoblastic leukemia, in remission: Secondary | ICD-10-CM | POA: Diagnosis not present

## 2017-08-09 DIAGNOSIS — Z7901 Long term (current) use of anticoagulants: Secondary | ICD-10-CM | POA: Diagnosis not present

## 2017-08-09 DIAGNOSIS — Z66 Do not resuscitate: Secondary | ICD-10-CM | POA: Diagnosis not present

## 2017-08-09 DIAGNOSIS — Y830 Surgical operation with transplant of whole organ as the cause of abnormal reaction of the patient, or of later complication, without mention of misadventure at the time of the procedure: Secondary | ICD-10-CM | POA: Diagnosis not present

## 2017-08-09 DIAGNOSIS — D89811 Chronic graft-versus-host disease: Secondary | ICD-10-CM | POA: Diagnosis not present

## 2017-08-10 LAB — URINE CULTURE: Culture: <10000 — AB

## 2017-08-11 ENCOUNTER — Encounter: Payer: Self-pay | Admitting: Family Medicine

## 2017-08-11 MED ORDER — CITALOPRAM HYDROBROMIDE 20 MG PO TABS
40.00 | ORAL_TABLET | ORAL | Status: DC
Start: 2017-08-12 — End: 2017-08-11

## 2017-08-11 MED ORDER — POLYETHYLENE GLYCOL 3350 17 G PO PACK
17.00 g | PACK | ORAL | Status: DC
Start: ? — End: 2017-08-11

## 2017-08-11 MED ORDER — GENERIC EXTERNAL MEDICATION
2.00 | Status: DC
Start: ? — End: 2017-08-11

## 2017-08-11 MED ORDER — NEFAZODONE HCL 50 MG PO TABS
50.00 | ORAL_TABLET | ORAL | Status: DC
Start: 2017-08-11 — End: 2017-08-11

## 2017-08-11 MED ORDER — POLYETHYL GLYCOL-PROPYL GLYCOL 0.4-0.3 % OP SOLN
2.00 | OPHTHALMIC | Status: DC
Start: ? — End: 2017-08-11

## 2017-08-11 MED ORDER — SODIUM CHLORIDE 0.9 % IJ SOLN
20.00 | INTRAMUSCULAR | Status: DC
Start: 2017-08-11 — End: 2017-08-11

## 2017-08-11 MED ORDER — ALBUTEROL SULFATE (2.5 MG/3ML) 0.083% IN NEBU
5.00 | INHALATION_SOLUTION | RESPIRATORY_TRACT | Status: DC
Start: ? — End: 2017-08-11

## 2017-08-11 MED ORDER — DOCUSATE SODIUM 100 MG PO CAPS
100.00 | ORAL_CAPSULE | ORAL | Status: DC
Start: ? — End: 2017-08-11

## 2017-08-11 MED ORDER — ACYCLOVIR 400 MG PO TABS
400.00 | ORAL_TABLET | ORAL | Status: DC
Start: 2017-08-11 — End: 2017-08-11

## 2017-08-11 MED ORDER — SODIUM CHLORIDE 0.9 % IJ SOLN
20.00 | INTRAMUSCULAR | Status: DC
Start: ? — End: 2017-08-11

## 2017-08-11 MED ORDER — MONTELUKAST SODIUM 10 MG PO TABS
10.00 | ORAL_TABLET | ORAL | Status: DC
Start: 2017-08-11 — End: 2017-08-11

## 2017-08-11 MED ORDER — GENERIC EXTERNAL MEDICATION
3.00 | Status: DC
Start: 2017-08-11 — End: 2017-08-11

## 2017-08-11 MED ORDER — NITROGLYCERIN 0.4 MG SL SUBL
.40 | SUBLINGUAL_TABLET | SUBLINGUAL | Status: DC
Start: ? — End: 2017-08-11

## 2017-08-11 MED ORDER — GENERIC EXTERNAL MEDICATION
30.00 | Status: DC
Start: 2017-08-12 — End: 2017-08-11

## 2017-08-11 MED ORDER — ALBUTEROL SULFATE HFA 108 (90 BASE) MCG/ACT IN AERS
2.00 | INHALATION_SPRAY | RESPIRATORY_TRACT | Status: DC
Start: ? — End: 2017-08-11

## 2017-08-11 MED ORDER — APIXABAN 5 MG PO TABS
5.00 | ORAL_TABLET | ORAL | Status: DC
Start: 2017-08-11 — End: 2017-08-11

## 2017-08-11 MED ORDER — METOPROLOL TARTRATE 25 MG PO TABS
12.50 | ORAL_TABLET | ORAL | Status: DC
Start: 2017-08-11 — End: 2017-08-11

## 2017-08-11 MED ORDER — PANTOPRAZOLE SODIUM 40 MG PO TBEC
40.00 | DELAYED_RELEASE_TABLET | ORAL | Status: DC
Start: 2017-08-11 — End: 2017-08-11

## 2017-08-11 MED ORDER — POTASSIUM CHLORIDE ER 10 MEQ PO CPCR
ORAL_CAPSULE | ORAL | Status: DC
Start: 2017-08-11 — End: 2017-08-11

## 2017-08-11 MED ORDER — CLONAZEPAM 0.5 MG PO TABS
0.25 | ORAL_TABLET | ORAL | Status: DC
Start: 2017-08-11 — End: 2017-08-11

## 2017-08-11 MED ORDER — AZITHROMYCIN 250 MG PO TABS
250.00 | ORAL_TABLET | ORAL | Status: DC
Start: ? — End: 2017-08-11

## 2017-08-11 MED ORDER — VENLAFAXINE HCL 50 MG PO TABS
150.00 | ORAL_TABLET | ORAL | Status: DC
Start: 2017-08-11 — End: 2017-08-11

## 2017-08-11 MED ORDER — FLUCONAZOLE 200 MG PO TABS
200.00 | ORAL_TABLET | ORAL | Status: DC
Start: 2017-08-12 — End: 2017-08-11

## 2017-08-11 MED ORDER — SULFAMETHOXAZOLE-TRIMETHOPRIM 800-160 MG PO TABS
1.00 | ORAL_TABLET | ORAL | Status: DC
Start: 2017-08-14 — End: 2017-08-11

## 2017-08-11 MED ORDER — HYDROMORPHONE HCL 4 MG PO TABS
4.00 | ORAL_TABLET | ORAL | Status: DC
Start: ? — End: 2017-08-11

## 2017-08-14 LAB — CULTURE, BLOOD (SINGLE)
Culture: NO GROWTH
Special Requests: ADEQUATE

## 2017-08-14 LAB — CULTURE, BLOOD (ROUTINE X 2)
CULTURE: NO GROWTH
Culture: NO GROWTH
SPECIAL REQUESTS: ADEQUATE
SPECIAL REQUESTS: ADEQUATE

## 2017-08-17 DIAGNOSIS — R69 Illness, unspecified: Secondary | ICD-10-CM | POA: Diagnosis not present

## 2017-08-17 DIAGNOSIS — C9101 Acute lymphoblastic leukemia, in remission: Secondary | ICD-10-CM | POA: Diagnosis not present

## 2017-08-17 DIAGNOSIS — Z856 Personal history of leukemia: Secondary | ICD-10-CM | POA: Diagnosis not present

## 2017-08-17 DIAGNOSIS — Z86718 Personal history of other venous thrombosis and embolism: Secondary | ICD-10-CM | POA: Diagnosis not present

## 2017-08-17 DIAGNOSIS — J9611 Chronic respiratory failure with hypoxia: Secondary | ICD-10-CM | POA: Diagnosis not present

## 2017-08-17 DIAGNOSIS — Z95828 Presence of other vascular implants and grafts: Secondary | ICD-10-CM | POA: Diagnosis not present

## 2017-08-17 DIAGNOSIS — G4700 Insomnia, unspecified: Secondary | ICD-10-CM | POA: Diagnosis not present

## 2017-08-17 DIAGNOSIS — G8929 Other chronic pain: Secondary | ICD-10-CM | POA: Diagnosis not present

## 2017-08-17 DIAGNOSIS — I5030 Unspecified diastolic (congestive) heart failure: Secondary | ICD-10-CM | POA: Diagnosis not present

## 2017-08-17 DIAGNOSIS — Z86711 Personal history of pulmonary embolism: Secondary | ICD-10-CM | POA: Diagnosis not present

## 2017-08-17 DIAGNOSIS — J9612 Chronic respiratory failure with hypercapnia: Secondary | ICD-10-CM | POA: Diagnosis not present

## 2017-08-17 DIAGNOSIS — D89811 Chronic graft-versus-host disease: Secondary | ICD-10-CM | POA: Diagnosis not present

## 2017-08-24 ENCOUNTER — Encounter: Payer: Self-pay | Admitting: Family Medicine

## 2017-08-24 ENCOUNTER — Ambulatory Visit (INDEPENDENT_AMBULATORY_CARE_PROVIDER_SITE_OTHER): Payer: Medicare HMO | Admitting: Family Medicine

## 2017-08-24 VITALS — BP 168/95 | HR 101 | Temp 97.8°F | Ht 66.0 in | Wt 205.6 lb

## 2017-08-24 DIAGNOSIS — R0602 Shortness of breath: Secondary | ICD-10-CM

## 2017-08-24 DIAGNOSIS — R55 Syncope and collapse: Secondary | ICD-10-CM | POA: Diagnosis not present

## 2017-08-24 NOTE — Progress Notes (Signed)
BP (!) 168/95   Pulse (!) 101   Temp 97.8 F (36.6 C) (Oral)   Ht 5' 6" (1.676 m)   Wt 205 lb 9.6 oz (93.3 kg)   BMI 33.18 kg/m    Subjective:    Patient ID: Derek Shepherd, male    DOB: 08-Apr-1955, 62 y.o.   MRN: [id number]  HPI: Derek Shepherd is a 62 y.o. male presenting on 08/24/2017 for Hospitalization Follow-up (went to [facility] was sent to [facility])   [facility] follow-up Patient is coming in today for hospital follow-up for syncope and shortness of breath and CHF and COPD.  Patient was in the hospital from 08/08/2017 until 08/11/2017.  Although of note patient had been in the hospital for times since he was last seen here in our office.  During his hospitalization they found his COPD to be the main cause of what they thought was his issues including the syncope and recommended urgent follow-up with his pulmonology.  It does not look like an appointment has been made anytime in the near future and he only has an appointment with pulmonology on October 3.  They also discussed with him possible palliative and hospice and patient is agreeable to take information for this and give them a call.  Patient says he still feels very short of breath despite his pulse ox being within normal limits here in the office today.  Patient also says that they recommended he get referral for a BiPAP to use at night, I said this is been beyond thus and he would need to see his pulmonologist for this as well.  He denies any fevers or chills or cough but does feel congestion and does have some fluid swelling and then feels short of breath as well.  He does admit that he is only been taking torsemide as needed and recommended that he did try to take 1 especially because his weight is up a couple pounds from where it normally is.  He does weigh himself daily.  He has not had any further syncopal episodes since leaving the hospital  Relevant past medical, surgical, family and social history reviewed and updated as  indicated. Interim medical history since our last visit reviewed. Allergies and medications reviewed and updated.  Review of Systems  Constitutional: Negative for chills and fever.  Respiratory: Positive for shortness of breath. Negative for chest tightness and wheezing.   Cardiovascular: Positive for palpitations and leg swelling. Negative for chest pain.  Musculoskeletal: Negative for back pain and gait problem.  Skin: Negative for rash.  Neurological: Positive for dizziness, syncope and light-headedness. Negative for speech difficulty and weakness.  All other systems reviewed and are negative.   Per HPI unless specifically indicated above   Allergies as of 08/24/2017      Reactions   Nsaids Shortness Of Breath   Pt states he can take ibuprofen occasionally with no reaction   Pregabalin Other (See Comments)   Fluid retention      Medication List        Accurate as of 08/24/17  4:43 PM. Always use your most recent med list.          acyclovir 400 MG tablet Commonly known as:  ZOVIRAX Take 400 mg by mouth 2 (two) times daily.   citalopram 40 MG tablet Commonly known as:  CELEXA Take 1 tablet (40 mg total) by mouth daily.   cloNIDine 0.1 MG tablet Commonly known as:  CATAPRES Take 0.1-0.2  mg by mouth at bedtime.   ELIQUIS 5 MG Tabs tablet Generic drug:  apixaban Take 5 mg by mouth 2 (two) times daily.   fluticasone 50 MCG/ACT nasal spray Commonly known as:  FLONASE USE ONE SPRAY(S) IN EACH NOSTRIL ONCE DAILY   folic acid 1 MG tablet Commonly known as:  FOLVITE TAKE 1 TABLET BY MOUTH ONCE DAILY   glucose blood test strip Commonly known as:  IGLUCOSE TEST STRIPS Use to check blood sugars twice daily DX E11.9   HYDROmorphone 4 MG tablet Commonly known as:  DILAUDID Take 4 mg by mouth 5 (five) times daily.   INCRUSE ELLIPTA 62.5 MCG/INH Aepb Generic drug:  umeclidinium bromide INHALE 1 PUFF BY MOUTH ONCE DAILY   lactulose 10 GM/15ML solution Commonly  known as:  CHRONULAC Take by mouth daily as needed for mild constipation or moderate constipation.   levalbuterol 1.25 MG/3ML nebulizer solution Commonly known as:  XOPENEX USE ONE VIAL IN NEBULIZER 4 TIMES DAILY   metFORMIN 500 MG tablet Commonly known as:  GLUCOPHAGE Take 1 tablet (500 mg total) by mouth daily with breakfast.   montelukast 10 MG tablet Commonly known as:  SINGULAIR TAKE 1 TABLET BY MOUTH ONCE DAILY   multivitamin with minerals Tabs tablet Take 1 tablet by mouth daily.   nefazodone 50 MG tablet Commonly known as:  SERZONE TAKE 1 TABLET BY MOUTH AT BEDTIME (NO LONGER TAKING ELIQUIS)   pantoprazole 40 MG tablet Commonly known as:  PROTONIX Take 1 tablet (40 mg total) by mouth at bedtime.   potassium chloride 10 MEQ CR capsule Commonly known as:  MICRO-K TAKE THREE CAPSULES BY MOUTH TWICE DAILY   predniSONE 10 MG tablet Commonly known as:  DELTASONE Take 10 mg by mouth 2 (two) times daily.   PROAIR HFA 108 (90 Base) MCG/ACT inhaler Generic drug:  albuterol Inhale 2 puffs into the lungs daily.   promethazine 12.5 MG tablet Commonly known as:  PHENERGAN Take 12.5 mg by mouth every 6 (six) hours as needed for nausea or vomiting.   ranitidine 150 MG tablet Commonly known as:  ZANTAC Take 150 mg by mouth 2 (two) times daily.   rOPINIRole 3 MG tablet Commonly known as:  REQUIP TAKE 1 TABLET BY MOUTH ONCE DAILY AT BEDTIME   sulfamethoxazole-trimethoprim 800-160 MG tablet Commonly known as:  BACTRIM DS,SEPTRA DS Take 1 tablet by mouth every Monday, Wednesday, and Friday.   SYMBICORT 160-4.5 MCG/ACT inhaler Generic drug:  budesonide-formoterol INHALE TWO PUFFS BY MOUTH ONCE DAILY IN THE MORNING   SYSTANE 0.4-0.3 % Soln Generic drug:  Polyethyl Glycol-Propyl Glycol Place 1 drop into both eyes 5 (five) times daily as needed (for dry eyes).   tiotropium 18 MCG inhalation capsule Commonly known as:  SPIRIVA HANDIHALER Place 1 capsule (18 mcg total)  daily into inhaler and inhale.   torsemide 20 MG tablet Commonly known as:  DEMADEX Take as directed according to weight   triamcinolone cream 0.1 % Commonly known as:  KENALOG Apply 1 application topically 2 (two) times daily as needed (rash/itching).   TRUBIOTICS Caps Take 1 capsule by mouth daily.   venlafaxine 75 MG tablet Commonly known as:  EFFEXOR TAKE 1 TABLET BY MOUTH IN THE EVENING FOR 7 DAYS THEN INCREASE TO 2 BY MOUTH EVERY EVENING          Objective:    BP (!) 168/95   Pulse (!) 101   Temp 97.8 F (36.6 C) (Oral)   Ht 5' 6" (1.676  m)   Wt 205 lb 9.6 oz (93.3 kg)   BMI 33.18 kg/m   Wt Readings from Last 3 Encounters:  08/24/17 205 lb 9.6 oz (93.3 kg)  08/08/17 192 lb (87.1 kg)  08/04/17 192 lb 6 oz (87.3 kg)    Physical Exam  Constitutional: He is oriented to person, place, and time. He appears well-developed and well-nourished. No distress.  Eyes: Conjunctivae are normal. No scleral icterus.  Neck: Neck supple. No thyromegaly present.  Cardiovascular: Normal rate.  Heart sounds distant and difficult to hear  Pulmonary/Chest: Effort normal. No accessory muscle usage. No respiratory distress. He has decreased breath sounds in the right upper field, the right middle field, the right lower field, the left upper field, the left middle field and the left lower field. He has no wheezes. He has no rhonchi.  2 L nasal cannula on currently  Musculoskeletal: Normal range of motion. He exhibits no edema.  Lymphadenopathy:    He has no cervical adenopathy.  Neurological: He is alert and oriented to person, place, and time. Coordination normal.  Skin: Skin is warm and dry. No rash noted. He is not diaphoretic.  Psychiatric: He has a normal mood and affect. His behavior is normal.  Nursing note and vitals reviewed.       Assessment & Plan:   Problem List Items Addressed This Visit    None    Visit Diagnoses    Shortness of breath    -  Primary   pt has CHf  and COPD, will get back to pulmonology   Relevant Orders   CBC with Differential/Platelet   CMP14+EGFR   Syncope, unspecified syncope type       Relevant Medications   cloNIDine (CATAPRES) 0.1 MG tablet      Patient already has torsemide and prednisone and oxygen, currently at [facility], recommend more quick appt for pulmonology, also recommend increase  Follow up plan: Return if symptoms worsen or fail to improve.  Counseling provided for all of the vaccine components No orders of the defined types were placed in this encounter.   Caryl Pina, MD [facility] 08/24/2017, 4:43 PM

## 2017-08-25 ENCOUNTER — Telehealth: Payer: Self-pay | Admitting: Family Medicine

## 2017-08-25 LAB — CBC WITH DIFFERENTIAL/PLATELET
BASOS ABS: 0 10*3/uL (ref 0.0–0.2)
Basos: 0 %
EOS (ABSOLUTE): 0 10*3/uL (ref 0.0–0.4)
EOS: 0 %
Hematocrit: 30.2 % — ABNORMAL LOW (ref 37.5–51.0)
Hemoglobin: 10.1 g/dL — ABNORMAL LOW (ref 13.0–17.7)
IMMATURE GRANS (ABS): 0.2 10*3/uL — AB (ref 0.0–0.1)
IMMATURE GRANULOCYTES: 2 %
LYMPHS ABS: 0.8 10*3/uL (ref 0.7–3.1)
Lymphs: 10 %
MCH: 35.1 pg — ABNORMAL HIGH (ref 26.6–33.0)
MCHC: 33.4 g/dL (ref 31.5–35.7)
MCV: 105 fL — ABNORMAL HIGH (ref 79–97)
MONOCYTES: 8 %
MONOS ABS: 0.6 10*3/uL (ref 0.1–0.9)
NEUTROS ABS: 6.3 10*3/uL (ref 1.4–7.0)
Neutrophils: 80 %
Platelets: 202 10*3/uL (ref 150–450)
RBC: 2.88 x10E6/uL — AB (ref 4.14–5.80)
RDW: 18.7 % — AB (ref 12.3–15.4)
WBC: 7.9 10*3/uL (ref 3.4–10.8)

## 2017-08-25 LAB — CMP14+EGFR
ALT: 25 IU/L (ref 0–44)
AST: 17 IU/L (ref 0–40)
Albumin/Globulin Ratio: 2.7 — ABNORMAL HIGH (ref 1.2–2.2)
Albumin: 4.1 g/dL (ref 3.5–5.5)
Alkaline Phosphatase: 42 IU/L (ref 39–117)
BUN/Creatinine Ratio: 13 (ref 9–20)
BUN: 11 mg/dL (ref 6–24)
Bilirubin Total: <0.2 mg/dL (ref 0.0–1.2)
CO2: 29 mmol/L (ref 20–29)
Calcium: 9.7 mg/dL (ref 8.7–10.2)
Chloride: 98 mmol/L (ref 96–106)
Creatinine, Ser: 0.85 mg/dL (ref 0.76–1.27)
GFR calc Af Amer: 110 mL/min/{1.73_m2} (ref >59)
GFR calc non Af Amer: 95 mL/min/{1.73_m2} (ref >59)
Globulin, Total: 1.5 g/dL (ref 1.5–4.5)
Glucose: 193 mg/dL — ABNORMAL HIGH (ref 65–99)
Potassium: 5.1 mmol/L (ref 3.5–5.2)
Sodium: 145 mmol/L — ABNORMAL HIGH (ref 134–144)
Total Protein: 5.6 g/dL — ABNORMAL LOW (ref 6.0–8.5)

## 2017-08-25 NOTE — Telephone Encounter (Signed)
Patient's wife notified of lab results.

## 2017-08-29 DIAGNOSIS — M25562 Pain in left knee: Secondary | ICD-10-CM | POA: Diagnosis not present

## 2017-08-29 DIAGNOSIS — G894 Chronic pain syndrome: Secondary | ICD-10-CM | POA: Diagnosis not present

## 2017-08-29 DIAGNOSIS — M79605 Pain in left leg: Secondary | ICD-10-CM | POA: Diagnosis not present

## 2017-08-31 DIAGNOSIS — D89811 Chronic graft-versus-host disease: Secondary | ICD-10-CM | POA: Diagnosis not present

## 2017-08-31 DIAGNOSIS — C9100 Acute lymphoblastic leukemia not having achieved remission: Secondary | ICD-10-CM | POA: Diagnosis not present

## 2017-08-31 DIAGNOSIS — J9611 Chronic respiratory failure with hypoxia: Secondary | ICD-10-CM | POA: Diagnosis not present

## 2017-08-31 DIAGNOSIS — Z7189 Other specified counseling: Secondary | ICD-10-CM | POA: Diagnosis not present

## 2017-08-31 DIAGNOSIS — G894 Chronic pain syndrome: Secondary | ICD-10-CM | POA: Diagnosis not present

## 2017-08-31 DIAGNOSIS — J411 Mucopurulent chronic bronchitis: Secondary | ICD-10-CM | POA: Diagnosis not present

## 2017-08-31 DIAGNOSIS — I2609 Other pulmonary embolism with acute cor pulmonale: Secondary | ICD-10-CM | POA: Diagnosis not present

## 2017-08-31 DIAGNOSIS — C9101 Acute lymphoblastic leukemia, in remission: Secondary | ICD-10-CM | POA: Diagnosis not present

## 2017-09-03 DIAGNOSIS — R0902 Hypoxemia: Secondary | ICD-10-CM | POA: Diagnosis not present

## 2017-09-05 ENCOUNTER — Telehealth: Payer: Self-pay | Admitting: *Deleted

## 2017-09-05 NOTE — Telephone Encounter (Signed)
Hospice nurse called to let Dr. Warrick Parisian know Pt's condition is deteriorating, fell this morning, is on blood thinner, was not a continuous bleed just has bruising

## 2017-09-08 ENCOUNTER — Other Ambulatory Visit: Payer: Self-pay | Admitting: *Deleted

## 2017-09-08 NOTE — Telephone Encounter (Signed)
Opened encounter in error  

## 2017-09-11 ENCOUNTER — Ambulatory Visit: Payer: Medicare HMO | Admitting: Physician Assistant

## 2017-09-22 ENCOUNTER — Ambulatory Visit: Payer: Medicare HMO | Admitting: Pharmacist Clinician (PhC)/ Clinical Pharmacy Specialist

## 2017-09-26 ENCOUNTER — Ambulatory Visit: Payer: Medicare HMO | Admitting: Cardiology

## 2017-10-05 NOTE — Telephone Encounter (Signed)
Would likely consider stopping his blood thinner but I just got the message that he passed away.

## 2017-10-05 DEATH — deceased

## 2017-11-01 ENCOUNTER — Ambulatory Visit: Payer: Medicare HMO | Admitting: Family Medicine

## 2018-11-29 IMAGING — DX DG CHEST 2V
2 series · 2 of 2 positions shown · non-contrast
Comparison: Chest x-ray dated January 29, 2017.

CLINICAL DATA: Intermittent chest pain.

EXAM:
CHEST - 2 VIEW

[chest lat]
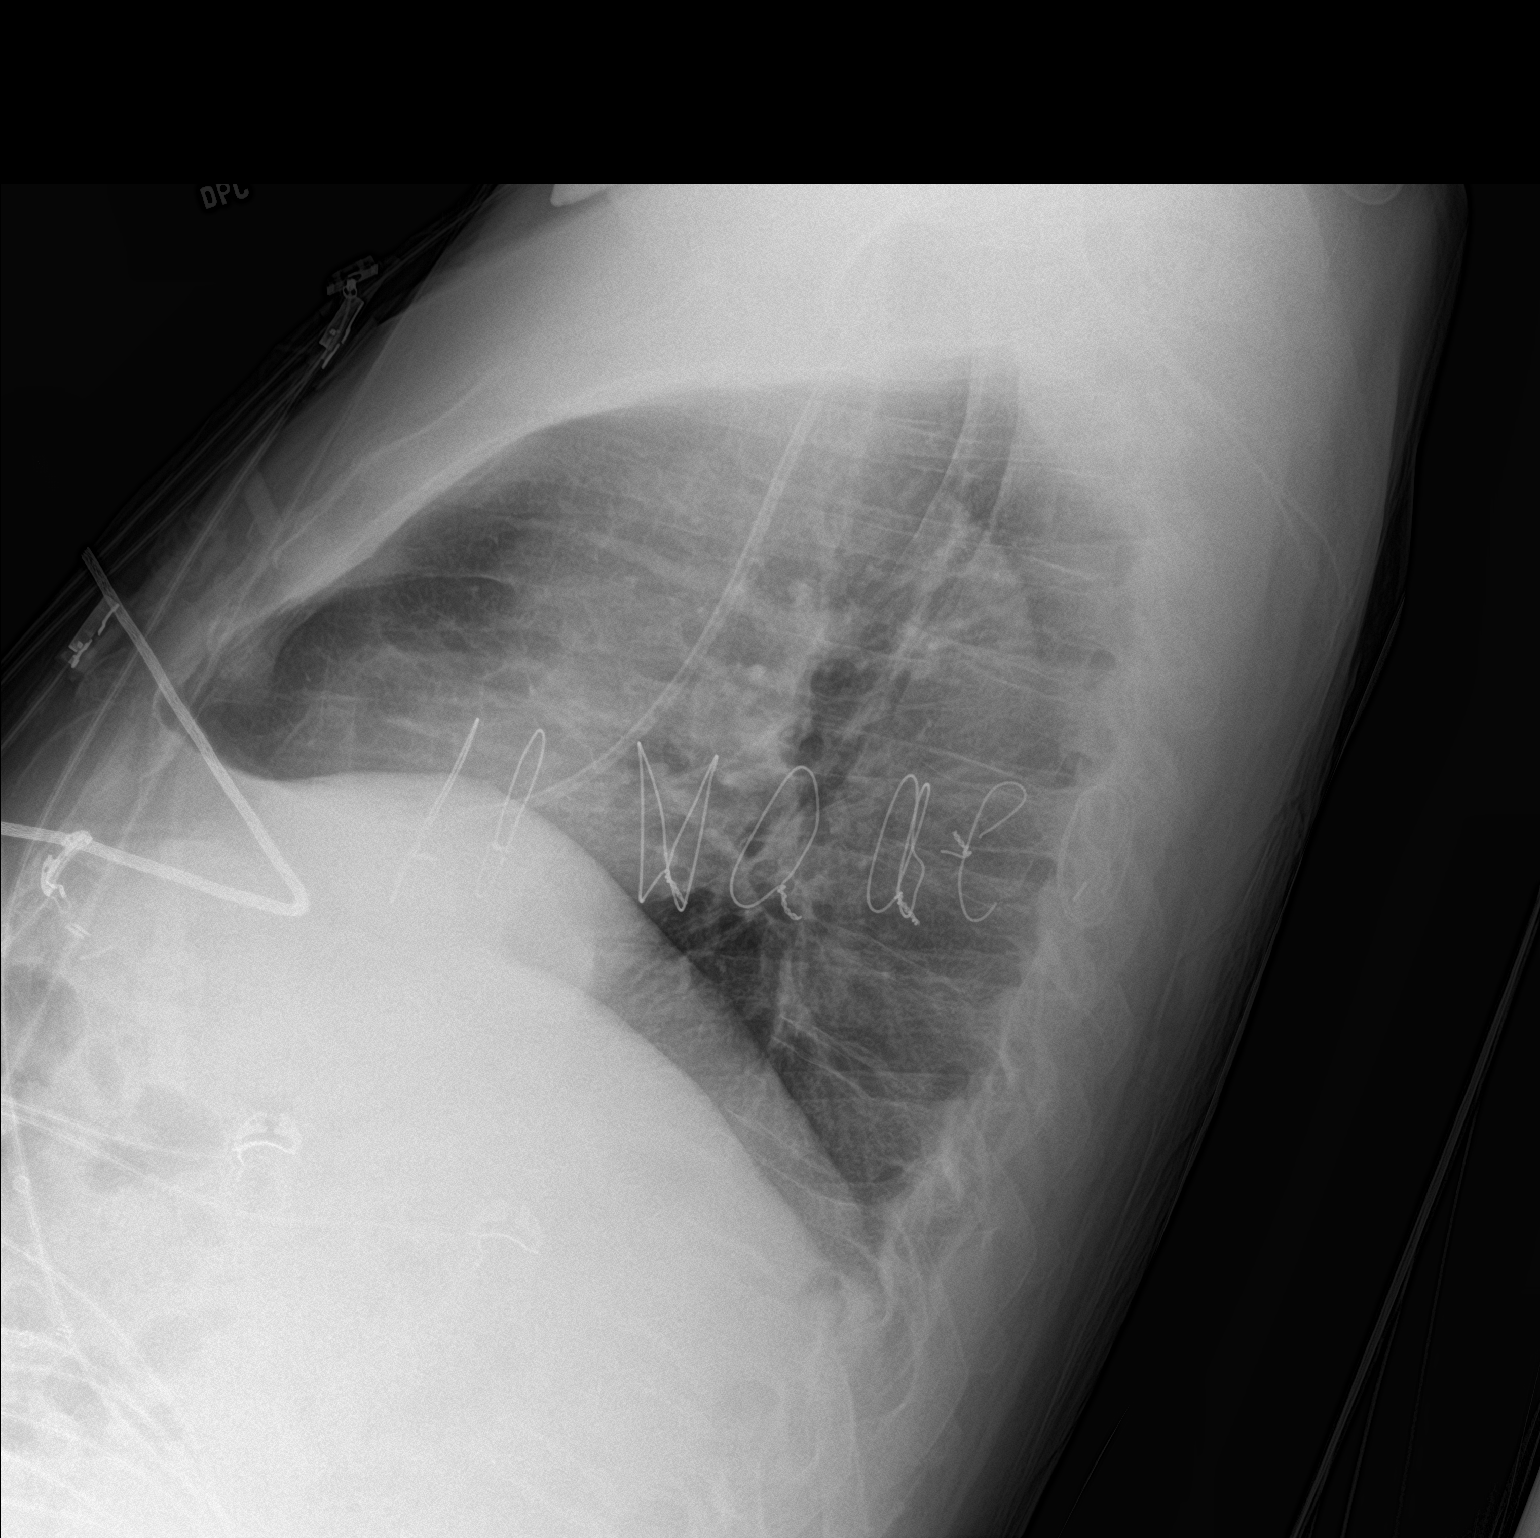

[chest ap]
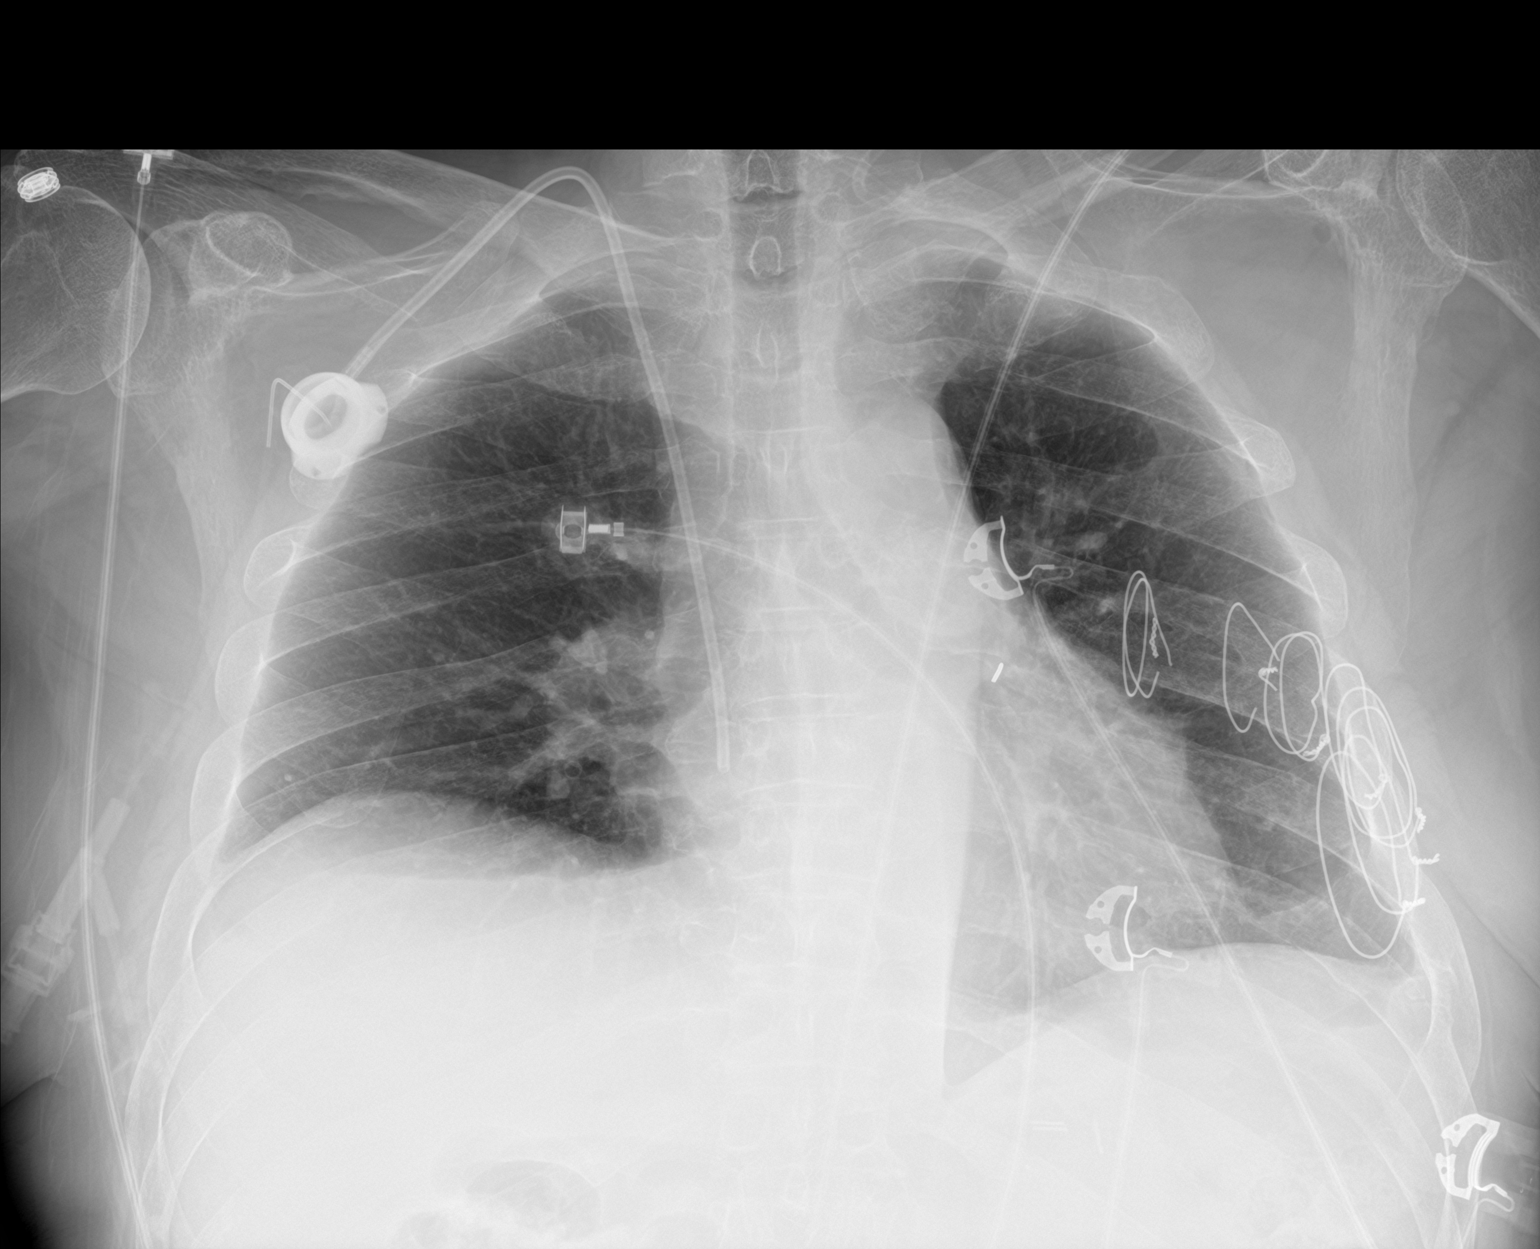

[2 of 2 positions shown; findings below may reference images not displayed]

FINDINGS: Interval replacement of the right chest wall port catheter, with the
tip in the proximal right atrium. The heart size and mediastinal
contours are within normal limits. Normal pulmonary vascularity. Low
lung volumes. No focal consolidation, pleural effusion, or
pneumothorax. No acute osseous abnormality. Postsurgical changes
along the left fifth and sixth ribs, similar to prior study.
IMPRESSION: No active cardiopulmonary disease.
